# Patient Record
Sex: Female | Born: 1950
Health system: Southern US, Community
[De-identification: ages and names within clinical notes are randomized; demographics above are authoritative.]

## PROBLEM LIST (undated history)

## (undated) DIAGNOSIS — I10 Essential (primary) hypertension: Secondary | ICD-10-CM

## (undated) DIAGNOSIS — R519 Headache, unspecified: Secondary | ICD-10-CM

## (undated) DIAGNOSIS — R42 Dizziness and giddiness: Secondary | ICD-10-CM

## (undated) DIAGNOSIS — E785 Hyperlipidemia, unspecified: Secondary | ICD-10-CM

## (undated) DIAGNOSIS — M1990 Unspecified osteoarthritis, unspecified site: Secondary | ICD-10-CM

## (undated) DIAGNOSIS — T4145XA Adverse effect of unspecified anesthetic, initial encounter: Secondary | ICD-10-CM

## (undated) DIAGNOSIS — F419 Anxiety disorder, unspecified: Secondary | ICD-10-CM

## (undated) DIAGNOSIS — T8859XA Other complications of anesthesia, initial encounter: Secondary | ICD-10-CM

## (undated) DIAGNOSIS — G4700 Insomnia, unspecified: Secondary | ICD-10-CM

## (undated) DIAGNOSIS — K5900 Constipation, unspecified: Secondary | ICD-10-CM

## (undated) DIAGNOSIS — E669 Obesity, unspecified: Secondary | ICD-10-CM

## (undated) DIAGNOSIS — K219 Gastro-esophageal reflux disease without esophagitis: Secondary | ICD-10-CM

## (undated) DIAGNOSIS — E119 Type 2 diabetes mellitus without complications: Secondary | ICD-10-CM

## (undated) DIAGNOSIS — R011 Cardiac murmur, unspecified: Secondary | ICD-10-CM

## (undated) DIAGNOSIS — N3941 Urge incontinence: Secondary | ICD-10-CM

## (undated) DIAGNOSIS — R51 Headache: Secondary | ICD-10-CM

## (undated) DIAGNOSIS — B369 Superficial mycosis, unspecified: Secondary | ICD-10-CM

## (undated) DIAGNOSIS — K5904 Chronic idiopathic constipation: Secondary | ICD-10-CM

## (undated) DIAGNOSIS — M069 Rheumatoid arthritis, unspecified: Secondary | ICD-10-CM

## (undated) HISTORY — DX: Insomnia, unspecified: G47.00

## (undated) HISTORY — DX: Constipation, unspecified: K59.00

## (undated) HISTORY — PX: TOE SURGERY: SHX1073

## (undated) HISTORY — DX: Hyperlipidemia, unspecified: E78.5

## (undated) HISTORY — DX: Essential (primary) hypertension: I10

## (undated) HISTORY — PX: TOTAL HIP ARTHROPLASTY: SHX124

## (undated) HISTORY — DX: Rheumatoid arthritis, unspecified: M06.9

## (undated) HISTORY — DX: Unspecified osteoarthritis, unspecified site: M19.90

## (undated) HISTORY — DX: Superficial mycosis, unspecified: B36.9

## (undated) HISTORY — DX: Type 2 diabetes mellitus without complications: E11.9

## (undated) HISTORY — DX: Chronic idiopathic constipation: K59.04

## (undated) HISTORY — DX: Dizziness and giddiness: R42

## (undated) HISTORY — PX: JOINT REPLACEMENT: SHX530

## (undated) HISTORY — DX: Urge incontinence: N39.41

---

## 1985-07-10 HISTORY — PX: ABDOMINAL HYSTERECTOMY: SHX81

## 1985-07-10 HISTORY — PX: PARTIAL HYSTERECTOMY: SHX80

## 1987-07-11 HISTORY — PX: ABDOMINAL HYSTERECTOMY: SHX81

## 1999-09-12 ENCOUNTER — Encounter: Payer: Self-pay | Admitting: Orthopedic Surgery

## 1999-09-13 ENCOUNTER — Encounter: Payer: Self-pay | Admitting: Orthopedic Surgery

## 1999-09-13 ENCOUNTER — Inpatient Hospital Stay (HOSPITAL_COMMUNITY): Admission: RE | Admit: 1999-09-13 | Discharge: 1999-09-18 | Payer: Self-pay | Admitting: Orthopedic Surgery

## 2001-06-24 ENCOUNTER — Ambulatory Visit (HOSPITAL_COMMUNITY): Admission: RE | Admit: 2001-06-24 | Discharge: 2001-06-24 | Payer: Self-pay | Admitting: Specialist

## 2001-06-24 ENCOUNTER — Encounter: Payer: Self-pay | Admitting: Specialist

## 2001-07-05 ENCOUNTER — Ambulatory Visit (HOSPITAL_COMMUNITY): Admission: RE | Admit: 2001-07-05 | Discharge: 2001-07-05 | Payer: Self-pay | Admitting: Family Medicine

## 2001-07-05 ENCOUNTER — Encounter: Payer: Self-pay | Admitting: Family Medicine

## 2002-01-31 ENCOUNTER — Encounter: Payer: Self-pay | Admitting: Podiatry

## 2002-02-03 ENCOUNTER — Ambulatory Visit (HOSPITAL_COMMUNITY): Admission: RE | Admit: 2002-02-03 | Discharge: 2002-02-03 | Payer: Self-pay | Admitting: Podiatry

## 2002-07-04 ENCOUNTER — Encounter: Payer: Self-pay | Admitting: Specialist

## 2002-07-04 ENCOUNTER — Ambulatory Visit (HOSPITAL_COMMUNITY): Admission: RE | Admit: 2002-07-04 | Discharge: 2002-07-04 | Payer: Self-pay | Admitting: Specialist

## 2002-10-10 ENCOUNTER — Ambulatory Visit (HOSPITAL_COMMUNITY): Admission: RE | Admit: 2002-10-10 | Discharge: 2002-10-10 | Payer: Self-pay | Admitting: Family Medicine

## 2002-10-10 ENCOUNTER — Encounter: Payer: Self-pay | Admitting: Family Medicine

## 2002-10-20 ENCOUNTER — Ambulatory Visit (HOSPITAL_COMMUNITY): Admission: RE | Admit: 2002-10-20 | Discharge: 2002-10-20 | Payer: Self-pay | Admitting: Internal Medicine

## 2002-10-20 HISTORY — PX: COLONOSCOPY: SHX174

## 2002-12-26 ENCOUNTER — Ambulatory Visit: Admission: RE | Admit: 2002-12-26 | Discharge: 2002-12-26 | Payer: Self-pay | Admitting: Family Medicine

## 2003-07-24 ENCOUNTER — Ambulatory Visit (HOSPITAL_COMMUNITY): Admission: RE | Admit: 2003-07-24 | Discharge: 2003-07-24 | Payer: Self-pay | Admitting: Specialist

## 2004-09-09 ENCOUNTER — Ambulatory Visit (HOSPITAL_COMMUNITY): Admission: RE | Admit: 2004-09-09 | Discharge: 2004-09-09 | Payer: Self-pay | Admitting: Specialist

## 2004-12-02 ENCOUNTER — Encounter (HOSPITAL_COMMUNITY): Admission: RE | Admit: 2004-12-02 | Discharge: 2005-01-01 | Payer: Self-pay | Admitting: Oncology

## 2004-12-02 ENCOUNTER — Encounter: Admission: RE | Admit: 2004-12-02 | Discharge: 2004-12-02 | Payer: Self-pay | Admitting: Oncology

## 2004-12-02 ENCOUNTER — Ambulatory Visit (HOSPITAL_COMMUNITY): Payer: Self-pay | Admitting: Oncology

## 2005-08-25 ENCOUNTER — Ambulatory Visit (HOSPITAL_COMMUNITY): Admission: RE | Admit: 2005-08-25 | Discharge: 2005-08-25 | Payer: Self-pay | Admitting: Family Medicine

## 2005-12-08 ENCOUNTER — Encounter: Admission: RE | Admit: 2005-12-08 | Discharge: 2005-12-08 | Payer: Self-pay | Admitting: Specialist

## 2005-12-20 ENCOUNTER — Ambulatory Visit (HOSPITAL_COMMUNITY): Admission: EM | Admit: 2005-12-20 | Discharge: 2005-12-21 | Payer: Self-pay | Admitting: Emergency Medicine

## 2005-12-20 ENCOUNTER — Encounter: Payer: Self-pay | Admitting: Emergency Medicine

## 2006-02-15 ENCOUNTER — Ambulatory Visit (HOSPITAL_COMMUNITY): Admission: RE | Admit: 2006-02-15 | Discharge: 2006-02-15 | Payer: Self-pay | Admitting: Family Medicine

## 2006-07-10 HISTORY — PX: TOTAL KNEE ARTHROPLASTY: SHX125

## 2006-08-31 ENCOUNTER — Ambulatory Visit (HOSPITAL_COMMUNITY): Admission: RE | Admit: 2006-08-31 | Discharge: 2006-08-31 | Payer: Self-pay | Admitting: Family Medicine

## 2006-09-24 ENCOUNTER — Ambulatory Visit: Payer: Self-pay | Admitting: Orthopedic Surgery

## 2006-09-24 ENCOUNTER — Inpatient Hospital Stay (HOSPITAL_COMMUNITY): Admission: EM | Admit: 2006-09-24 | Discharge: 2006-09-25 | Payer: Self-pay | Admitting: Emergency Medicine

## 2006-12-24 ENCOUNTER — Ambulatory Visit (HOSPITAL_COMMUNITY): Admission: EM | Admit: 2006-12-24 | Discharge: 2006-12-24 | Payer: Self-pay | Admitting: Emergency Medicine

## 2007-01-08 ENCOUNTER — Ambulatory Visit (HOSPITAL_COMMUNITY): Admission: RE | Admit: 2007-01-08 | Discharge: 2007-01-08 | Payer: Self-pay | Admitting: Family Medicine

## 2007-04-02 ENCOUNTER — Inpatient Hospital Stay (HOSPITAL_COMMUNITY): Admission: RE | Admit: 2007-04-02 | Discharge: 2007-04-05 | Payer: Self-pay | Admitting: Orthopedic Surgery

## 2007-05-25 ENCOUNTER — Ambulatory Visit (HOSPITAL_COMMUNITY): Admission: EM | Admit: 2007-05-25 | Discharge: 2007-05-25 | Payer: Self-pay | Admitting: Emergency Medicine

## 2007-07-08 ENCOUNTER — Inpatient Hospital Stay (HOSPITAL_COMMUNITY): Admission: RE | Admit: 2007-07-08 | Discharge: 2007-07-12 | Payer: Self-pay | Admitting: Orthopedic Surgery

## 2008-01-09 ENCOUNTER — Ambulatory Visit (HOSPITAL_COMMUNITY): Admission: RE | Admit: 2008-01-09 | Discharge: 2008-01-09 | Payer: Self-pay | Admitting: Obstetrics & Gynecology

## 2008-07-10 HISTORY — PX: TOTAL HIP ARTHROPLASTY: SHX124

## 2009-04-30 ENCOUNTER — Ambulatory Visit (HOSPITAL_COMMUNITY): Admission: RE | Admit: 2009-04-30 | Discharge: 2009-04-30 | Payer: Self-pay | Admitting: Family Medicine

## 2009-05-21 ENCOUNTER — Ambulatory Visit (HOSPITAL_COMMUNITY): Admission: RE | Admit: 2009-05-21 | Discharge: 2009-05-21 | Payer: Self-pay | Admitting: Obstetrics & Gynecology

## 2009-07-05 ENCOUNTER — Inpatient Hospital Stay (HOSPITAL_COMMUNITY): Admission: RE | Admit: 2009-07-05 | Discharge: 2009-07-08 | Payer: Self-pay | Admitting: Orthopedic Surgery

## 2010-07-05 ENCOUNTER — Ambulatory Visit (HOSPITAL_COMMUNITY)
Admission: RE | Admit: 2010-07-05 | Discharge: 2010-07-05 | Payer: Self-pay | Source: Home / Self Care | Attending: Family Medicine | Admitting: Family Medicine

## 2010-07-30 ENCOUNTER — Encounter: Payer: Self-pay | Admitting: Specialist

## 2010-07-30 ENCOUNTER — Encounter: Payer: Self-pay | Admitting: Family Medicine

## 2010-09-23 ENCOUNTER — Emergency Department (HOSPITAL_COMMUNITY): Payer: BC Managed Care – PPO

## 2010-09-23 ENCOUNTER — Emergency Department (HOSPITAL_COMMUNITY)
Admission: EM | Admit: 2010-09-23 | Discharge: 2010-09-24 | Disposition: A | Discharge status: Home / Self Care | Payer: BC Managed Care – PPO | Attending: Emergency Medicine | Admitting: Emergency Medicine

## 2010-09-23 DIAGNOSIS — R0989 Other specified symptoms and signs involving the circulatory and respiratory systems: Secondary | ICD-10-CM | POA: Insufficient documentation

## 2010-09-23 DIAGNOSIS — I10 Essential (primary) hypertension: Secondary | ICD-10-CM | POA: Insufficient documentation

## 2010-09-23 DIAGNOSIS — Z79899 Other long term (current) drug therapy: Secondary | ICD-10-CM | POA: Insufficient documentation

## 2010-09-23 DIAGNOSIS — R0609 Other forms of dyspnea: Secondary | ICD-10-CM | POA: Insufficient documentation

## 2010-09-23 DIAGNOSIS — M129 Arthropathy, unspecified: Secondary | ICD-10-CM | POA: Insufficient documentation

## 2010-09-23 DIAGNOSIS — R0602 Shortness of breath: Secondary | ICD-10-CM | POA: Insufficient documentation

## 2010-09-23 DIAGNOSIS — R51 Headache: Secondary | ICD-10-CM | POA: Insufficient documentation

## 2010-10-10 LAB — URINALYSIS, ROUTINE W REFLEX MICROSCOPIC
Bilirubin Urine: NEGATIVE
Nitrite: NEGATIVE
Urobilinogen, UA: 1 mg/dL (ref 0.0–1.0)
pH: 8 (ref 5.0–8.0)

## 2010-10-10 LAB — CBC
HCT: 29.9 % — ABNORMAL LOW (ref 36.0–46.0)
HCT: 41.1 % (ref 36.0–46.0)
Hemoglobin: 13.6 g/dL (ref 12.0–15.0)
Hemoglobin: 9.9 g/dL — ABNORMAL LOW (ref 12.0–15.0)
MCHC: 33.1 g/dL (ref 30.0–36.0)
MCHC: 33.2 g/dL (ref 30.0–36.0)
Platelets: 215 10*3/uL (ref 150–400)
RDW: 13.9 % (ref 11.5–15.5)
RDW: 14.6 % (ref 11.5–15.5)
RDW: 14.8 % (ref 11.5–15.5)
WBC: 5.8 10*3/uL (ref 4.0–10.5)

## 2010-10-10 LAB — DIFFERENTIAL
Monocytes Absolute: 0.4 10*3/uL (ref 0.1–1.0)
Monocytes Relative: 6 % (ref 3–12)

## 2010-10-10 LAB — BASIC METABOLIC PANEL
BUN: 16 mg/dL (ref 6–23)
BUN: 8 mg/dL (ref 6–23)
CO2: 31 mEq/L (ref 19–32)
Calcium: 8.7 mg/dL (ref 8.4–10.5)
Calcium: 8.8 mg/dL (ref 8.4–10.5)
Calcium: 9.9 mg/dL (ref 8.4–10.5)
Creatinine, Ser: 0.6 mg/dL (ref 0.4–1.2)
GFR calc Af Amer: >60 mL/min (ref >60)
GFR calc non Af Amer: >60 mL/min (ref >60)
Glucose, Bld: 105 mg/dL — ABNORMAL HIGH (ref 70–99)
Glucose, Bld: 117 mg/dL — ABNORMAL HIGH (ref 70–99)
Glucose, Bld: 97 mg/dL (ref 70–99)
Potassium: 4 mEq/L (ref 3.5–5.1)
Sodium: 139 mEq/L (ref 135–145)
Sodium: 140 mEq/L (ref 135–145)

## 2010-10-10 LAB — TYPE AND SCREEN: ABO/RH(D): O NEG

## 2010-10-10 LAB — HEMOGLOBIN AND HEMATOCRIT, BLOOD: HCT: 24.7 % — ABNORMAL LOW (ref 36.0–46.0)

## 2010-10-10 LAB — PROTIME-INR: Prothrombin Time: 13.3 seconds (ref 11.6–15.2)

## 2010-11-22 NOTE — H&P (Signed)
NAMEANNABELLE, [name]                 ACCOUNT NO.:  [id number]   MEDICAL RECORD NO.:  [id number]          PATIENT TYPE:  INP   LOCATION:  NA                           FACILITY:  [facility]   PHYSICIAN:  Madlyn Frankel. Charlann Boxer, M.D.  DATE OF BIRTH:  1951/06/20   DATE OF ADMISSION:  04/02/2007  DATE OF DISCHARGE:                              HISTORY & PHYSICAL   PROCEDURE:  Left total knee arthroplasty.   CHIEF COMPLAINT:  Left knee pain.   HISTORY OF PRESENT ILLNESS:  This is a 60 year old female with a history  persistent progressive left knee pain secondary to osteoarthritis.  It  has been refractory to all conservative treatments.  She had been  presurgically assessed by per primary care physician, Dr. Phillips Odor.   PAST MEDICAL HISTORY:  1. Osteoarthritis.  2. Hypertension.  3. Hyperlipidemia.   PAST SURGICAL HISTORY:  1. Hysterectomy.  2. Total hip placement in 2001.  3. Bunionectomy and hammertoe procedure in 2003.   FAMILY HISTORY:  Heart disease, hypertension, diabetes, arthritis.   SOCIAL HISTORY:  Divorced.  Primary caregiver after surgery, friend.   ALLERGIES:  NO KNOWN DRUG ALLERGIES.   MEDICATIONS:  1. Diflunisal 500 mg p.o. b.i.d.  2. Bupropion SR 150 mg p.o. b.i.d.  3. Simvastatin 40 mg, 1/2 tablet once daily.  4. Zolpidem 10 mg p.o. q.h.s. p.r.n.   REVIEW OF SYSTEMS:  MUSCULOSKELETAL:  She has some right ankle pain with  a history of osteoarthritis.  Otherwise, see HPI.   PHYSICAL EXAMINATION:  VITAL SIGNS:  Pulse 64, respirations 18, blood  pressure 120/72.  GENERAL:  Awake, alert and oriented, well-developed, well-nourished.  NECK:  Supple.  No carotid bruits.  CHEST/LUNGS:  Clear to auscultation bilaterally.  BREASTS:  Deferred.  HEART:  Regular rate and rhythm without murmurs.  ABDOMEN:  Soft, nontender, nondistended.  Bowel sounds present.  GENITOURINARY:  Deferred.  EXTREMITIES:  She has a left knee valgus.  Right ankle pain, lateral  joint line.  SKIN:   Intact.  No cellulitis.  NEUROLOGIC:  Intact distal sensibilities.   LABORATORY DATA:  Preoperative CMET showed her sodium 138, potassium  3.7, glucose 87, BUN 15, creatinine 0.82.   EKG:  Normal sinus rhythm.  All other labs and chest x-ray pending  presurgical testing.   IMPRESSION:  1. Osteoarthritis.  2. Hypertension.  3  Hyperlipidemia.   PROCEDURE:  Left total knee arthroplasty on April 02, 1999 at [facility] by surgeon Dr. Durene Romans.  The risks and complications  were discussed.   Postoperative medications including Lovenox, Robaxin, over-the-counter  iron, aspirin, Colace, MiraLax provided at time of history and physical.  Pain medicines will be prescribed at time of surgery.     ______________________________  Yetta Glassman [address], [place]      Madlyn Frankel. Charlann Boxer, M.D.  Electronically Signed    BLM/MEDQ  D:  04/01/2007  T:  04/01/2007  Job:  [id number]

## 2010-11-22 NOTE — H&P (Signed)
NAMERAYYAN, ORSBORN                 ACCOUNT NO.:  [id number]   MEDICAL RECORD NO.:  [id number]          PATIENT TYPE:  INP   LOCATION:  NA                           FACILITY:  [facility]   PHYSICIAN:  Madlyn Frankel. Charlann Boxer, M.D.  DATE OF BIRTH:  Jul 02, 1951   DATE OF ADMISSION:  07/08/2007  DATE OF DISCHARGE:                              HISTORY & PHYSICAL   PROCEDURE:  Revision right total hip arthroplasty.   CHIEF COMPLAINT:  Right hip instability.   HISTORY OF PRESENT ILLNESS:  This is a 60 year old female with a history  of right total hip replacement in 2001 with four dislocations over the  past year.  There has only been minor discomfort.  She has had  persistent instability after these dislocations.  She does have a recent  history of a left total knee replacement, for which she is doing very  well.   PAST MEDICAL HISTORY:  1. Osteoarthritis.  2. Hypertension.  3. Hypercholesteremia.   PAST SURGICAL HISTORY:  1. Hysterectomy in 1987.  2. Right total knee replacement in 2001.  3. Foot surgery in 2003.  4. Multiple hip dislocations in July 2007, March, June and November      2008.   FAMILY HISTORY:  Heart disease, diabetes, hypertension.   SOCIAL HISTORY:  The patient is single.  Primary caregiver will be her  sister after surgery in the home.   DRUG ALLERGIES:  No known drug allergies.   MEDICATIONS:  1. Diflunisal 500 mg one p.o. b.i.d.  2. Bupropion SR 150 mg one p.o. b.i.d.  3. Ambien 10 mg p.o. p.r.n.  4. Simvastatin 20 mg one p.o. daily.  5. Lisinopril/HCTZ 20/25 mg one p.o. daily.  6. Multivitamin daily.  7. Calcium daily.   REVIEW OF SYSTEMS:  GENERAL:  Some recent weight change as well as some  morning night sweats.  HEENT/NEUROLOGIC:  She has had some insomnia and  ringing in her years.  Otherwise, see HPI.   PHYSICAL EXAMINATION:  Pulse 72, respirations 18, blood pressure 112/90.  GENERAL:  Awake, alert and oriented, well-developed, well-nourished, no  acute  distress.  NECK:  Supple.  No carotid bruits.  CHEST:  Lungs clear to auscultation bilaterally.  BREASTS:  Deferred.  HEART:  Regular rate and rhythm without murmurs.  ABDOMEN:  Soft, nontender, nondistended.  Bowel sounds present.  GENITOURINARY:  Deferred.  EXTREMITIES:  Right hip:  No increased pain with internal range of  motion.  Nontender to palpation over the lateral trochanteric bursa  area.  SKIN:  A posterior lateral scar from previous hip surgery.  No  cellulitis.  NEUROLOGIC:  Intact distal sensibilities.   Labs, EKG, chest x-ray all pending presurgical testing.   IMPRESSION:  1. Multiple dislocations of right total hip arthroplasty with      instability.  2. Osteoarthritis.  3. Left total knee replacement.  4. Hypertension.  5. Dyslipidemia.   PLAN OF ACTION:  A revision right total hip arthroplasty by surgeon Dr.  Durene Romans on July 08, 2007, at [facility].  Risks and  complications were discussed.  Questions were encouraged, answered and  reviewed.   Postoperative medications including Lovenox, Robaxin, iron, aspirin,  Colace, MiraLax provided at time of history and physical.  Postoperative  pain medicines will be provided at time of surgery.     ______________________________  Yetta Glassman [address], [place]      Madlyn Frankel. Charlann Boxer, M.D.  Electronically Signed    BLM/MEDQ  D:  07/01/2007  T:  07/02/2007  Job:  [id number]   cc:   Corrie Mckusick, M.D.  Fax: [phone]   Dr. Christen Bame

## 2010-11-22 NOTE — Op Note (Signed)
[name], HEMMER                 ACCOUNT NO.:  [id number]   MEDICAL RECORD NO.:  [id number]          PATIENT TYPE:  INP   LOCATION:  0104                         FACILITY:  [facility]   PHYSICIAN:  Madlyn Frankel. Charlann Boxer, M.D.  DATE OF BIRTH:  Jan 07, 1951   DATE OF PROCEDURE:  05/25/2007  DATE OF DISCHARGE:  05/25/2007                               OPERATIVE REPORT   PREOPERATIVE DIAGNOSIS:  Dislocation of right total hip replacement.   POSTOPERATIVE DIAGNOSIS:  Dislocation of right total hip replacement.   PROCEDURE:  Closed reduction of right total hip replacement.   SURGEON:  Madlyn Frankel. Charlann Boxer, M.D.   ASSISTANT:  None.   COMPLICATIONS:  None.   ANESTHESIA:  General.   INDICATIONS FOR PROCEDURE:  The patient is a 60 year old female who I  have been following for instability of the right hip.  She had a total  hip replacement performed in the past and now this is her fourth  dislocation.  After the third dislocation, we talked about revising it.  She had a period of time where she had not had any problems with it and  we at our last discussion planned to continue due to the amount of time  she had been out of work from previous hip as well as knee surgery.  Unfortunately, she slipped today, not falling but having a popping  sensation and dislocating the right hip.  She was brought to the  emergency room where radiographs revealed the above findings.   I reviewed with her the current situation and revise her at an upcoming  date based on this.   DESCRIPTION OF PROCEDURE:  The patient was brought to the operative  theatre and once adequate anesthesia was established, the patient was  positioned supine.  Pressure was placed on her pelvis.  The hip was  flexed to 90, traction was applied, and the hip was audibly palpably  reduced.  Leg lengths were normal.  The foot turned from shortened and  internally rotated position back to its normal neutral slightly external  rotated position.   She was subsequently awakened from anesthesia and an 18 inch medium knee  immobilizer was applied to provide some stability from anterolateral  medial flexion.   As I reviewed with her, I will see her back in the office in two weeks.      Madlyn Frankel Charlann Boxer, M.D.  Electronically Signed     MDO/MEDQ  D:  05/25/2007  T:  05/26/2007  Job:  [id number]

## 2010-11-22 NOTE — Op Note (Signed)
NAMECHANNEL, PAPANDREA                 ACCOUNT NO.:  [id number]   MEDICAL RECORD NO.:  [id number]          PATIENT TYPE:  OBV   LOCATION:  2550                         FACILITY:  [facility]   PHYSICIAN:  Ollen Gross, M.D.    DATE OF BIRTH:  Nov 16, 1950   DATE OF PROCEDURE:  12/24/2006  DATE OF DISCHARGE:  12/24/2006                               OPERATIVE REPORT   PREOPERATIVE DIAGNOSIS:  Dislocated right total hip arthroplasty.   POSTOPERATIVE DIAGNOSIS:  Dislocated right total hip arthroplasty.   PROCEDURE:  Right hip closed reduction.   SURGEON:  Ollen Gross, M.D.   ASSISTANT:  Alexzandrew L. Perkins, P.A.C.   ANESTHESIA:  General.   COMPLICATIONS:  None.   CONDITION:  Stable to recovery.   BRIEF CLINICAL NOTE:  Ms. Cahn is a 61 year old female who had a total  hip arthroplasty done approximately 7 years ago.  She did very well with  it.  Last June, she sustained her first dislocations and has had another  subsequent dislocation 2 months ago.  Unfortunately, this evening at  work she bent down to pick something up and dislocated it again.  She  was taken to the emergency room and presents now for closed reduction.   PROCEDURE IN DETAIL:  After successful administration of general  anesthetic, we placed compression over her pelvis, and then with  longitudinal traction in a 90/90 position I was able to reduce the hip.  She had good stability, full extension in external rotation and at about  90 degrees of flexion and did not rotate internally.  X-ray confirmed  concentric reduction of the hip.  She was placed into a knee  immobilizer, awakened, and transported to recovery in stable condition.      Ollen Gross, M.D.  Electronically Signed     FA/MEDQ  D:  12/24/2006  T:  12/25/2006  Job:  [id number]

## 2010-11-22 NOTE — Op Note (Signed)
[name], [name]                 ACCOUNT NO.:  [id number]   MEDICAL RECORD NO.:  [id number]          PATIENT TYPE:  INP   LOCATION:  1605                         FACILITY:  [facility]   PHYSICIAN:  Madlyn Frankel. Charlann Boxer, M.D.  DATE OF BIRTH:  [birth year]   DATE OF PROCEDURE:  04/02/2007  DATE OF DISCHARGE:                               OPERATIVE REPORT   PREOPERATIVE DIAGNOSIS:  Left knee end stage osteoarthritis.   POSTOPERATIVE DIAGNOSIS:  Left knee end stage osteoarthritis.   PROCEDURE:  Left total knee replacement.   COMPONENTS USED:  DePuy rotating platform posterior stabilized knee  system, size 3 femur, size 3 tibia, 15 mm insert, 38 patellar button.   SURGEON:  Madlyn Frankel. Charlann Boxer, M.D.   ASSISTANT:  [name], P.A.-C.   ANESTHESIA:  Duramorph spinal.   DRAINS:  One.   COMPLICATIONS:  None.   TOURNIQUET TIME:  55 minutes at 250 mmHg.   FINDINGS:  Please note, preoperatively she was noted have pretty  significant valgus deformity, worse dynamically, in probably 10 degrees  of hyperextension, with the valgus of probably 15-20 degrees, given her  preceding combined deformity.   INDICATIONS FOR PROCEDURE:  [name] is a 60 year old female who I  initially was following for some issues regarding an unstable hip  replacement performed by another surgeon.  She had advanced left knee  osteoarthritis we have been following conservatively, as well, that  failed to respond to recent conservative measures and her decreased  quality life based on decreased mobility ultimately lead to her decision  to proceed with knee replacement surgery.  She may ultimately require  her right hip to be revised.  We reviewed the risks and benefits of this  surgery in association with her hip issues and also a discussion of  infection, DVT, component failure, need for revision surgery.  Consent  was obtained.   PROCEDURE IN DETAIL:  The patient was brought to operative theater.  Once adequate  anesthesia and preoperative antibiotics, Ancef, were  administered, the patient was positioned supine.  A left proximal thigh  tourniquet was placed.  The left knee was pre-scrubbed and prepped and  draped in a sterile fashion.  A slightly curved incision was made for  this valgus deformity knowing that it would be corrected when she was  straight.  A median parapatellar arthrotomy was created as opposed to  lateral due to the fact that she did not have a fixed valgus deformity.   Routine exposure was carried out.  She was noted to have lax tissues and  thin tendons.  These were protected throughout the case.  Once the knee  was exposed, attention was first directed to the patella.  The patella  was noted to be severely deformed primarily on the lateral facet.  Her  medial facet was only about 17 mm precut and the lateral side was  significantly less than this.  I used an oscillating saw at this point  to cut bone down on this medial facet to about 13 mm.  This approximated  the medial facet to the  lateral facet, but there was no significant bone  cut made. After trimming it up, I determined a size 38 patellar button  would work best. I used the smooth pin to create some further holes in  the sclerotic lateral remaining facet for interdigitation of cement.  I  then drilled the holes for the 3 peg for the patella, getting good  normal depth on the medial facet and only about partial 1/3 depth  laterally.  At this point, a metal shim was placed to protect the  patella from further cuts.   Attention was now directed to the femur.  The femoral canal was opened  and irrigated to prevent fat emboli.  I then passed the intramedullary  rod.  Based on her laxity and hyperextension, I only resected 9 mm of  bone off the distal femur at 5 degrees valgus. 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   At this point, I attended to the sizing. Based on the posterior condylar  axis for rotation, it was actually perpendicular to the [facility] lines  and displaced the sclerotic nature. The anterior, posterior, and chamfer  cuts were all made. Following this, I made further drill holes into this  very sclerotic bone laterally. Bone cuts were checked and rechecked a  couple of times including during the trial, noted later to prevent any  problems with fracture from impacting the metal prosthesis in the  sclerotic bone.  Following these cuts, the box cut was made based off  the lateral aspect of the distal femur.   Attention was now directed to the tibia. With the tibia subluxated  anteriorly, I used the extramedullary guide. Based on the appearance of  the radiographs and the laxity of the knee, I chose to only take 2 mm of  bone off the lateral proximal tibia.  This amounted to what appeared to  be about a 6 mm cut medially.  Following this cut, I checked an  extension block and at this point, I was unable to even get the  extension block in place.  For this purpose, I repositioned my cutting  block and resected another 6 mm of bone.  After doing this cut, I was  able to at least get the 10 and 12 looked like there was a slight bit of  laxity and I thought that I made need to go to a 15.  At this point, I  checked with the size 3 proximal tibia and felt this fit the cut surface  nicely providing the best surface area coverage.  I checked the  alignment rod and was happy that it passed through the center of the  medial third of tubercle and into the ankle in both planes. I drilled  and keel punched this area.  Care, again, was taken not to cause  problems or complications due to the fact that she had sclerotic bone  laterally.  Some drill holes were made into the proximal lateral tibia.   At this point, trial reduction was carried out  with the 3 femur, 3  tibia, and a 15 mm insert. The knee came out to full extension, a slight  bit of laxity medially still, but the lateral side remained tight.  The  patella tracked without application of pressure.  All trial  components  were removed at this point and I used laminar spreaders to debride some  of the posterior, medial, and lateral osteophytes, further debridement  was carried out as necessary.  Once I was satisfied with this  debridement, further drill holes were made for interdigitation of  cement.  The knee was irrigated with normal saline solution.  I then  injected the knee with 0.25% Marcaine with epinephrine and 1 mL of  Toradol.   The final components were opened and cement mixed.  The components were  then cemented in position.  A 15 mm spacer was placed and the knee  brought out to full extension with a varus load applied.  The extruded  cement was removed.  Once I was satisfied that the cement had cured,  excessive cement was debrided.  Further debridements of cement were  carried out as necessary.  I injected 5 mL of FloSeal into the  posterior, medial, and lateral gutters as well as the posterior capsular  area.  Then, the final 15 mm insert was placed. The knee was brought to  extension.  A medium Hemovac drain was placed.  I then brought the knee  to flexion and with #1 Vicryls, reapproximated the  extensor mechanism. 2-0 Vicryl and 4-0 running Monocryl were used on the  skin.  The skin was cleaned, dried, and dressed sterilely with Steri-  Strips and a sterile bulky wrap.  She was brought to the recovery room  in stable condition.      Madlyn Frankel Charlann Boxer, M.D.  Electronically Signed     MDO/MEDQ  D:  04/02/2007  T:  04/02/2007  Job:  [id number]

## 2010-11-22 NOTE — Op Note (Signed)
[name], [name]                 ACCOUNT NO.:  [id number]   MEDICAL RECORD NO.:  [id number]          PATIENT TYPE:  INP   LOCATION:  0001                         FACILITY:  [facility]   PHYSICIAN:  Madlyn Frankel. Charlann Boxer, M.D.  DATE OF BIRTH:  [birth year]   DATE OF PROCEDURE:  07/08/2007  DATE OF DISCHARGE:                               OPERATIVE REPORT   PREOPERATIVE DIAGNOSIS:  Right total hip instability.   POSTOPERATIVE DIAGNOSIS:  Right total hip instability.   PROCEDURES:  1. Revision right total hip replacement.  2. Trochanteric osteotomy.  3. Repair of osteotomy.   COMPONENTS USED:  DePuy hip system with a size 52 multihole Pinnacle  cup, 36 neutral metal liner and SROM 18 x 13 standard length with an 18D  small sleeve and a 18 x 13, 36 +12 stem, 36 +3 metal ball.   SURGEON:  Charlann Boxer   ASSISTANT:  Dwyane Luo   ANESTHESIA:  General.   BLOOD LOSS:  1 liter.   AUTOTRANSFUSIONS:  4 mL.   DRAINS:  Times one.   COMPLICATIONS:  None.   INDICATIONS FOR PROCEDURE:  Ms. Wollenberg is a 60 year old patient that I  have been following for some time for her hip after initial dislocation.  She had an index procedure performed in 2001 with at least four  dislocations over the past year.   She had at this point undergone a left total knee replacement by myself  and done real well with that.  Given the recurrence of her instability,  she wished to discuss revision surgery.  Extensive discussion  preoperatively discussed the possible need for revising the entire hip  if the components were out of position or if components were not  available.  There was no constrained liner available for the system.  We  reviewed the risks and benefits of this type procedure including the  obvious increased risk of recurrent dislocation.  The standard risks of  infection, DVT, component failure, need for revision surgery were all  reviewed.  Consent was obtained.   PROCEDURE IN DETAIL:  The patient was brought  to the operative theater.  Once adequate anesthesia, preoperative antibiotics, 2 grams of Ancef,  were administered.  The patient was positioned in the left lateral  decubitus position with the right side up.  The right lower extremity  was prescrubbed and prepped and draped in sterile fashion.  The patient  was noted to have a very long index incision.  This basically was  utilized.  I excised the entire portion of it, excising down to the  iliotibial band and gluteal fascia.  I initially exposed just the  proximal femur in the standard fashion.  The hip was easily dislocated  with just a little bit of internal rotation.  The femoral head was  removed.   Attention was now directed for approximately 15-20 minutes at the  proximal femur following debridement.  I tried to see if there was  anyway to loop the component at all.  After this 15-20-minute period of  time, I was not satisfied with making any progress  at all.  I decided to  go to the preoperatively planned osteotomy.  I exposed the proximal  femur at this point to the appropriate depth.  I then used the drill  holes to create a lot of my osteotomy and on the posterior aspect of the  lateral third of the femur, I used a small oscillating saw that was  utilized for ACL surgery to create a crack, and then I extended this  across the femur laterally to the anterior aspect and then slightly up  the anterior side.  Then utilizing a series of osteotomes I was able to  elevate this lateral third of the femur anteriorly exposing the stem.  I  then used osteotomes again to loosen it up further.  Once I saw a little  bit of motion,  I used a loop extractor and was able to remove the stem.  What I found was very interesting.  There was adequate bony ingrowth  proximally but significant bone ingrowth within splines of the distal  aspect of this component.  It was a secure fit plus component.   This made for a bit of an interesting plan as I  was initially planning  to use an SROM component to allow for offset but also proximal ingrowth  in this young female.   At this point I used a combination of Matt Holmes and pituitary rongeurs to  remove bone, identifying the distal pedestal.  I was able to pass the  [place] long drill bit past this.  There was still a pedestal.   With this hole opening here I did wire back the osteotomy with 18-gauge  wires, 3 of them, putting it back in anatomic position.  At this point I  attended to the acetabulum.  The acetabular liner was removed utilizing  the 6.5 cancellous screw.  Once I did this, I removed the metal ring and  used the same liner for the Zimmer explant system.  Utilizing a  combination of the short and then long blades and also traditional  osteotomes, I was able to remove the acetabulum.  There was some bone  loss, particularly in the medial aspect of the hip and a little bit  superior.  The bed was otherwise well-maintained with no fracture to the  pelvis.  I packed this off and now attended to the femur a bit more.  I  passed the ball-tipped guidewire past the area of this pedestal and then  used flexible reamers to ream through this area to have no concern for  effect of the straight reamers.  I reamed up to a 13 with good chatter.  I then backed down to a straight reamer with a size 10, 11, 12, then  12.5, 13 and 13.5 three quarters of the way down.  I then prepared the  proximal femur with a proximal reamer to a size D and then milled to a  small.   At this point I placed a D small sleeve.  Based on the location of the  previously placed stem and where I had milled, this was at the position  of about 15-20 degrees.   At this point with the sleeve in position, I retracted the femur  anterior to allow for work on the acetabulum.  I began reaming with a 45  reamer and carried it down the medial wall as I had this room.  I then  reamed up to 51 reamer with good bony bed  preparation.  I impacted a 52  multihole Gription revision cup with an excellent initial secure fit.  The cup position was 35 degrees of abduction and 20 degrees forward  flexion at least in position anatomically.  The patient was noted to  have what appeared to be a retroverted native acetabulum with a large  anterior wall perhaps leading to some of her impingement issues.  I had  placed two cancellous screws to support this fixation and then placed a  trial liner.  I used an osteotome to debride some of the anterior wall  bone.  This was done without issue or complication.  Trial reduction was  now carried out initially with an 18 x 13, 36 +8 stem, 36 +3 ball.  I  did antevert the femoral stem another 20 degrees based on the SROM  system.  There was a bit of impingement still particularly in the sleep  position with internal rotation and hip flexion.  I chose then the trial  with a 36 +12 stem.  With this stem and a 36 +3 ball, the hip stability  was excellent.  There was about a millimeter of shuck, and the leg  lengths appeared to be comparable.  At this point all trial components  were removed.  I placed the final 36 metal neutral liner onto a clean  and dried acetabular bed.   The 18D small sleeve was then impacted to the level which was prepared.  I then used another 18-gauge wire wrapped around the proximal femur at  this area and tightened this down for further fixation.  I then passed  an 18 x 13, 36 +12 final stem at 20 degrees anteversion based on the  hash marks from the prosthesis with an excellent fit.  There were no  complicating features.  The wires that had been previously placed were  nondisplaced but then tightened and the osteotomy remained anatomically  reduced.   At this point I retrialed but was happy again with a 36 +3 ball.  The 36  +3 ball was then impacted into a clean and dry trunnion and the hip  reduced.  The hip was irrigated throughout the case.  I used  the Cell  Saver on this case and returned 4 mL of blood to the patient.  I placed  a medium Hemovac drain.  There was no significant  hemostasis that was  required.  Based on the debridements and exposure purposes,  I did not  reapproximate the capsule or any fascia posteriorly.  Instead I used #1  Vicryl to reapproximate the iliotibial band from the distal incision  portion all the way proximal in an interrupted fashion and then used #1  Vicryl in the gluteal fascia.  The remainder of the wound was closed  with 2-0 Vicryl and in staples.  The hip was cleaned, dried and dressed  sterilely with Mepilex dressing and a Hemovac placed.  The patient was  then extubated and brought to the recovery room with a knee immobilizer  in place without apparent perioperative complication.      Madlyn Frankel Charlann Boxer, M.D.  Electronically Signed     MDO/MEDQ  D:  07/08/2007  T:  07/08/2007  Job:  [id number]

## 2010-11-25 NOTE — Op Note (Signed)
NAMEWANETA, FITTING                 ACCOUNT NO.:  [id number]   MEDICAL RECORD NO.:  [id number]          PATIENT TYPE:  INP   LOCATION:  A332                          FACILITY:  [facility]   PHYSICIAN:  Vickki Hearing, M.D.DATE OF BIRTH:  1950-08-14   DATE OF PROCEDURE:  09/24/2006  DATE OF DISCHARGE:  09/25/2006                               OPERATIVE REPORT   INDICATIONS FOR PROCEDURE:  This59 year old female, status post right  total hip replacement in 2000, for osteoarthritis by Dr. Marlowe Kays, did well until 2007.  She the first dislocation and was  reduced by Dr. Shelle Iron.  The patient was followed by Dr. Simonne Come.  She  was standing with the hip in flexion and the right knee on a bed.  She  reached forward and the hip popped out at approximately 2:00 p.m. today.  She tried to convince herself that nothing was wrong with the hip.  Eventually she came to the emergency room for an attempt at a closed  reduction was performed using __________ and fentanyl.  This was  unsuccessful.  Post-reduction films showed the hip still dislocated.  She was then scheduled for surgery.   PREOPERATIVE DIAGNOSIS:  A closed dislocation of right total hip  prosthesis.   POSTOPERATIVE DIAGNOSIS:  A closed dislocation of right total hip  prosthesis.   PROCEDURE:  Closed reduction of right hip dislocation.   SURGEON:  Vickki Hearing, M.D.   ASSISTANT:  None.   ANESTHESIA:  General by IV sedation.   SPECIMENS:  No specimens.   PATHOLOGY:  No pathology.   ESTIMATED BLOOD LOSS:  No blood loss.   COMPLICATIONS:  No complications.  The patient went to PACU in good  condition.   DESCRIPTION OF PROCEDURE:  The procedure was done as follows:  With the  patient under sedation, the hip was reduced with hip flexion, mild  adduction and straight traction.  The hip popped into place.  Flexion,  extension,  abduction, and adduction actually showed the hip stable.  We  tried a push-pull test and  the hip stayed stable.  This was confirmed  under C-arm.   The patient was placed in an abduction pillow and taken to the recovery  room in stable condition.   DISPOSITION:  She will stay overnight for monitoring, since she had such  a large amount of sedation and also because of assessment of her  respiratory status after that sedation.   We will follow with a phone call to Dr. Simonne Come, M.D. tomorrow.      Vickki Hearing, M.D.  Electronically Signed     SEH/MEDQ  D:  09/24/2006  T:  09/25/2006  Job:  [id number]

## 2010-11-25 NOTE — Op Note (Signed)
[name], [name]                 ACCOUNT NO.:  [id number]   MEDICAL RECORD NO.:  [id number]          PATIENT TYPE:  INP   LOCATION:  0098                         FACILITY:  [facility]   PHYSICIAN:  Jene Every, M.D.    DATE OF BIRTH:  1951-04-24   DATE OF PROCEDURE:  12/20/2005  DATE OF DISCHARGE:                                 OPERATIVE REPORT   PREOPERATIVE DIAGNOSIS:  Dislocated right total hip replacement.   POSTOPERATIVE DIAGNOSIS:  Dislocated right total hip replacement.   PROCEDURE PERFORMED:  Closed reduction under anesthesia, right hip.   ANESTHESIA:  General.   ASSISTANT:  No assistant.   BRIEF HISTORY:  The patient is a 60 year old status post right total hip by  Dr. Simonne Come in 2001 who was stepping off an elliptical machine and  tripped, dislocated her hip.  Seen in the emergency room, and shortening and  internal rotation of her hip.  Radiographs indicated dislocation of the hip,  no fracture.  Operative intervention was indicated for closed reduction.  Risks and benefits discussed, including inability to reduce, need for open  reduction, etc.   TECHNIQUE:  The patient was in the supine position.  After induction of  adequate general anesthesia, gentle longitudinal traction, flexion, and  internal rotation was applied to the right lower extremity.  A reduction was  appreciated.  This restored her leg length, also the rotation.  The hip  under C-arm was found to be located.  There was no fracture or dissociation  of the components.  The components appeared to be in appropriate version.  She had good stability in flexion of 90 degrees, full extension, internal  and external rotation at 90 degrees of 30 degrees.   She was then placed on an abduction pillow, good pulses.  Extubated without  difficulty and transported to the respiratory rate in satisfactory  condition.   The patient tolerated the procedure well.  There were no complications.      Jene Every,  M.D.  Electronically Signed     JB/MEDQ  D:  12/20/2005  T:  12/20/2005  Job:  [id number]

## 2010-11-25 NOTE — Op Note (Signed)
NAMECHRISTI, [name]                           ACCOUNT NO.:  [id number]   MEDICAL RECORD NO.:  [id number]                   PATIENT TYPE:  AMB   LOCATION:  DAY                                  FACILITY:  [facility]   PHYSICIAN:  Oley Balm. Pricilla Holm, M.D.               DATE OF BIRTH:  1951-05-09   DATE OF PROCEDURE:  02/03/2002  DATE OF DISCHARGE:  02/03/2002                                 OPERATIVE REPORT   PREOPERATIVE DIAGNOSES:  1. Hallux valgus deformity bilaterally.  2. Hammertoe second digit bilaterally.   POSTOPERATIVE DIAGNOSES:  1. Hallux valgus deformity bilaterally.  2. Hammertoe second digit bilaterally.   PROCEDURE:  1. Austin bunionectomy bilaterally.  2. Arthroplasty interphalangeal fusion, second digit bilaterally.   SURGEON:  Emilio Math, D.P.M.   INDICATIONS FOR SURGERY:  Longstanding history of pain unrelieved by  conservative care.   DESCRIPTION OF PROCEDURE:  The patient brought in the operating room and  placed on the operating table in supine position.  The patient's lower feet  and legs were then prepped and draped in the usual manner.  Then with an  ankle tourniquet placed and well-padded to prevent contusion, I elevate to  250 mmHg.  After exsanguination of each foot, the following surgical  procedure was then performed under local standby anesthesia.   AUSTIN BUNIONECTOMY RIGHT FOOT:  Attention was directed to the right foot  level of the first MTP where a curvilinear incision made.  Incision run  deeply via sharp and blunt dissection, being sure to identify and retract  all vital structures.  A linear capsular incision was made, and the head of  the metatarsal freed from surrounding soft tissue attachments dorsally,  medially.  Utilizing a Zimmer oscillating saw, the medial eminence on the  medial aspect of the first metatarsal head was then resected.  Dissection  was then carried down deep in the first web space where a lateral fibular  sesamoidectomy  was performed.  Attention was then redirected to the medial  aspect of the first metatarsal head where an Austin-type osteotomy was made  from distal to proximal.  The capital fragment slid laterally.  The head of  the metatarsal was put in a more anatomical and correct functional position  and fixated with two .045 K-wires and after fixation, it was noted that the  osteotomy site was stable.  The remaining protruding aspect of the  metatarsal resected; all rough edges rasped smooth.  The wound washed with  copious amounts of sterile saline and the capsule and subcutaneous tissues  were reapproximated with __________ sutures of 4-0 Dexon.  Skin is  reapproximated utilizing running subcuticular suture of 4-0 Dexon.   AUSTIN BUNIONECTOMY LEFT FOOT:  A similar procedure was performed on the  left foot that was performed on the right foot with the exception the  fibular sesamoid was not removed.   ARTHROPLASTY INTERPHALANGEAL FUSION  SECOND TOE RIGHT FOOT:  Attention was  directed to the dorsal aspect of the right foot at the level of the  interphalangeal joint where two transverse similar elliptical converting  skin incisions were made.  Incision was widened deeply via sharp and blunt  dissection, being sure to identify and retract all vital structures.  A  capsular incision was then made, the head of the phalanx freed from  surrounding soft tissue attachments, and resected utilizing the Zimmer  oscillating saw.  The remaining protruding aspect of the middle phalanx was  resected, the two ends approximated and fixated with a .045 GK-wire.  After  fixation, it was noted that the osteotomy site and infusion site was stable,  the wound washed out with copious amounts of sterile saline, and the tendon  reapproximated utilizing one horizontal mattress suture of 4-0 Dexon.  Skin  reapproximated utilizing horizontal mattress sutures of 4-0 Prolene.   ARTHROPLASTY SECOND TOE LEFT FOOT:  Similar  procedure as described on the  second toe of the right foot was performed on the second toe of the left  foot with no additions or deletions.   All surgical sites were then infiltrated with approximately 18 cc of  dexamethasone phosphate and mild compressive bandage consisting of Betadine-  soaked Adaptic.  Sterile 4 x 4's and a sterile Kling were applied.  The  patient tolerated the procedure well and left the operating room in apparent  good condition, vital signs stable, to recovery room.                                               Oley Balm Pricilla Holm, M.D.    DBT/MEDQ  D:  02/03/2002  T:  02/06/2002  Job:  [id number]

## 2010-11-25 NOTE — Op Note (Signed)
[place]. [facility]  Patient:    [name], [name]                          MRN: [id number] Proc. Date: 09/13/99 Adm. Date:  [id number] Disc. Date: [id number] Attending:  Marlowe Kays Page                           Operative Report  PREOPERATIVE DIAGNOSIS:  Osteoarthritis, right hip.  POSTOPERATIVE DIAGNOSIS:  Osteoarthritis, right hip.  OPERATION:  Osteonics total hip arthroplasty, right with noncemented acetabular and femoral component.  SURGEON:  Illene Labrador. [place], M.D.  ASSISTANTS:  Georges Lynch. Gioffre, M.D. and Alexzandrew L. Perkins, P.A.-C.  ANESTHESIA:  General.  JUSTIFICATION FOR PROCEDURE:  She had complete destruction of the joint space with cystic changes in the acetabulum which may be on the femoral head.  DESCRIPTION OF PROCEDURE:  Prophylactic antibiotics and satisfactory general anesthesia.  Left lateral decubitus position on the [place] II frame.  The right hip was prepped with DuraPrep and draped in a sterile field.  Ioban employed. Usual posterolateral incision down to the fascia lata which was opened. Self-retaining retractor was inserted.  ______ band was cut.  External rotators were cut off the femur with cutting cautery, and the gluteus medius was partially detached and tagged with a #1 Ethibond.  The hip capsule was identified and a subtotal capsulectomy performed and hip dislocated.  Initial cut of femoral neck was made just below the femoral head and the piriformis fossa cleared of soft tissue.  A  guide pin was then placed down into the femur, overdrilled followed by a canal finder.  We then began the cylindrical reaming with preoperative templating, indicating probably a size 8 noncemented, and I want to go with noncemented components at her age of 28.  I used the greater trochanteric reamer as part of the reaming process at size 8.  I also placed the cutting guide for cutting the femoral neck with a second cut being  made a little longer than a fingerbreadth, since she had about 7/16 of an inch shortening of her right leg based on preoperative assessment.  She was advised preoperatively that stability was decreasing and we would place the appropriate size neck length to achieve stability, and try and regain as much length, and reestablish as much equality in leg lengths as possible.  After making the femoral neck cut and using the 8 cylindrical reamer, we went through the rasp, and indeed, an 8 rasp was the appropriate size.  I then reamed from the distal femoral tip up to 14 mm.  I went to the acetabulum where the capsulectomy was completed.  The acetabulum was cleared of soft tissue and I then began deepening and expanding reaming process, working up to a 48 mm which was hat was templated.  This seemed to be the appropriate size.  We then placed the Dual Geometry rim maker with a nice rim created.  After accessing the position of the cup with the turn of the guide, I placed the prominent cup, impacting it tightly.  We then went through a trial reduction with a 10-degree liner, with described line set at 11 oclock, and found the head to be  very stable with 127 degree angle, -3 neck.  Consequently, I went ahead and placed the ________ screw for the acetabulum followed by the final 10-degree liner, once again, with described line  at  11 oclock.  Then we returned to the femur where the Secur-Fit, size 8 with 14 mm tip was then inserted carefully to avoid any fractures.  I then went through another trial reduction, and actually found that a +0 neck length was the best it, which is what the final C-taper head was that we used.  The hip was then taken through a full range of motion and seemed to be stable with good coverage in all directions.  We then irrigated the wound well with antibiotic solution, as we had throughout the case.  The gluteus medius partial detachment was reapproximated  with interrupted #1 Ethibond.  The _______ was reconstituted with interrupted #1 Vicryl and external rotators through bone so drill holes and bone were the same.  The fascia lata was reapproximated with interrupted #1 Vicryl, the subcutaneous tissue with a combination of #1 and 2-0 Vicryl, and the skin with staples.  Betadine Adaptic ry sterile dressings were applied.  She was then gently placed on her back and an abduction pillow applied.  She had good pulses in the right foot.  X-ray to follow.  Estimated blood loss was approximately 500 cc.  No blood replacement. DD:  09/13/99 TD:  09/14/99 Job: [id number]

## 2010-11-25 NOTE — Discharge Summary (Signed)
[name], [name]                 ACCOUNT NO.:  [id number]   MEDICAL RECORD NO.:  [id number]          PATIENT TYPE:  INP   LOCATION:  1617                         FACILITY:  [facility]   PHYSICIAN:  Madlyn Frankel. Charlann Boxer, M.D.  DATE OF BIRTH:  1951-04-10   DATE OF ADMISSION:  07/08/2007  DATE OF DISCHARGE:  07/12/2007                               DISCHARGE SUMMARY   ADMITTING DIAGNOSES:  1. Right hip instability.  2. Osteoarthritis.  3. Hypertension.  4. Hypercholesteremia.   DISCHARGE DIAGNOSES:  1. Osteoarthritis.  2. Hypertension.  3. Hypercholesteremia.  4. Postoperative hypokalemia.  5. Acute blood loss anemia.   HISTORY OF PRESENT ILLNESS:  A 60 year old female with history of right  total hip replacement in 2001 with four dislocations over the past year.  She has had only minor discomfort, but she has had persistent  instability after these dislocations.  In addition she also has a  significant recent history of a left total knee replacement for which  she is doing very well.   CONSULTATION:  None.   PROCEDURES:  1. Revision right total hip placement.  2. Trochanteric osteotomy.  3. Repair osteotomy.  Surgeon was Dr. Durene Romans.  Assistant [employer] PA-C.   LABORATORY DATA:  Preadmission CBC:  Hematocrit 39.4 dropped down to  25.3.  Postop day #1 was given 2 units of blood.  Came back up and at  discharge was stable at 24.5.  White cell differential preadmission  neutrophils 40, neutrophil absolutes were 1.4, lymphs were 48.  At  discharge all within normal limits.  Coags:  INR 0.9.  Routine chemistry  preop all within normal limits.  Postop day #1 sodium 134, potassium  3.4, glucose 133.  At time of discharge sodium 138, potassium 3.3,  glucose 121.  Kidney function normal greater than 60 GFR with calcium 10  and at discharge greater than 60 GFR.  Calcium 7.8.  UA was negative.  As noted transfused 2 units of blood on the 30th.   RADIOLOGY:  Portable pelvis  showed postoperative changes, new hip  prosthesis.   Cardiology:  Previous EKG shows normal sinus rhythm.  No chest x-ray  found on chart.   HOSPITAL COURSE:  The patient underwent revision right total hip  replacement by surgeon Dr. Durene Romans.  Tolerated procedure well, and  was admitted to orthopedic floor.  Her stay was unremarkable.  She  remained afebrile throughout.  Her dressing was changed on a daily basis  without significant drainage to the wound, no sign of infection.  Hemovac was pulled on postop day #1.  She was neurovascularly intact in  the right lower extremity throughout.  Calf remained soft and nontender.  She was touchdown weightbearing 25% for four weeks.  We did give her an  IV fluid bolus due to some hypotensive type issues.  She did have a  little bit of difficulty sleeping.  Made very good progress with  physical therapy.  Able to ambulate at least 100 feet.  We did hold the  Lovenox due to a  little bit of ooze on the third day with plans to  recheck on the second prior to discharge.  When seen on the 2nd only  complaint was a runny nose. Dressing was changed.  Ooze had stopped.  Neurovascularly intact.  Held Lovenox for that day as well.  She was  ready for discharge home.   DISCHARGE DISPOSITION:  Discharged home with home health care PT and  Lovenox.   DISCHARGE CONDITION:  Stable and improved condition.   DISCHARGE DIET:  Regular as tolerated by the patient.   DISCHARGE WOUND CARE:  Keep wound dry.   DISCHARGE PHYSICAL THERAPY:  She is touchdown weightbearing 25% x4  weeks.   DISCHARGE MEDICATIONS:  1. Lovenox 40 mg subcu q.24 x10 days.  2. Vicodin 5/325 1-2 p.o. q.4-6 p.r.n. pain.  3. Robaxin 500 mg p.o. q.6.  4. Aspirin 325 mg one p.o. daily.  5. Iron 325 mg one p.o. t.i.d. x3 weeks.  6. Colace 100 mg p.o. b.i.d.  7. MiraLax 70 g p.o. daily.   HOME MEDICATIONS:  1. Dolobid 500 mg one p.o. b.i.d.  2. Bupropion 150 mg one p.o. b.i.d.  3.  Ambien 10 mg one p.o. p.r.n.  4. Simvastatin 20 mg one p.o. q.h.s.  5. Lisinopril HCTZ 20/25 one p.o. q.a.m.  6. Multivitamin p.o. daily.  7. Calcium p.o. daily.   DISCHARGE FOLLOWUP:  Follow up with Dr. Charlann Boxer at phone number [phone] in  10-14 days.     ______________________________  Yetta Glassman [address], [place]      Madlyn Frankel. Charlann Boxer, M.D.  Electronically Signed   BLM/MEDQ  D:  08/06/2007  T:  08/06/2007  Job:  [id number]

## 2010-11-25 NOTE — Discharge Summary (Signed)
NAMEAVION, [name]                 ACCOUNT NO.:  [id number]   MEDICAL RECORD NO.:  [id number]          PATIENT TYPE:  INP   LOCATION:  1605                         [facility]ACILITY:  [facility]   PHYSICIAN:  Madlyn Frankel. Charlann Boxer, M.D.  DATE OF BIRTH:  1951/02/04   DATE OF ADMISSION:  04/02/2007  DATE OF DISCHARGE:  04/05/2007                               DISCHARGE SUMMARY   ADMITTING DIAGNOSES:  1. Osteoarthritis.  2. Hypertension.  3. Hyperlipidemia.  4. Right total hip replacement in 2001.   DISCHARGE DIAGNOSES:  1. Osteoarthritis.  2. Hypertension.  3. Hyperlipidemia.  4. Right total hip replacement in 2001.   CONSULTS:  None.   PROCEDURE:  Left total knee replacement.  Surgeon:  Dr. Durene Romans.  Assistant:  Dwyane Luo.   BRIEF HISTORY OF PRESENT ILLNESS:  A 60 year old female with a history  of persistent progressive left knee pain secondary to osteoarthritis.  Refractory to all conservative treatments.  Presurgically assessed by  primary care physician, Dr. Phillips Odor.   LABORATORIES:  Preadmission CBC within normal limits.  At discharge,  hematocrit 26.5 and stable.  Coagulation normal.  Routine chemistry  preadmission normal; at discharge, no significant abnormalities.  Kidney  function normal.  Calcium 8.2 at discharge.  GI negative.  UA negative.   RADIOLOGY:  Chest two-view:  No acute findings.   EKG:  Normal sinus rhythm.   HOSPITAL COURSE:  The patient underwent left total knee replacement,  tolerated her procedure well, was admitted to the orthopedic floor.  She  remained afebrile throughout her course of stay.  She remained  neurovascularly intact throughout her course of stay with the ability to  do a straight leg raise before her discharge.  Dressing was changed on a  daily basis after postoperative day #1.  No significant drainage from  the wound.  She did have some right-sided lower extremity edema for  which we gave her some Lasix which helped significantly reduce the  edema.  She progressed nicely with her physical therapy.  She was  outfitted with a hinged knee brace due to some medial collateral laxity.  She was instructed to wear this when ambulating.  Day #3 DVT prophylaxis  had been ongoing for 2 days.  She was stable and improved, progressing  nicely with physical therapy and outfitted with a knee brace and ready  for discharge.   DISCHARGE DISPOSITION:  Discharge home with home healthcare PT and  Lovenox teaching.   DISCHARGE DIET:  Regular.   DISCHARGE WOUND CARE:  Keep dry.   DISCHARGE PHYSICAL THERAPY:  Weightbearing as tolerated with the use of  a rolling walker.  Also, use hinged knee brace for the next 6 weeks.  Goals of physical therapy will be work on gait training, proprioception,  minimize pain, maximize strength, increase range of motion with goals of  therapy being 0-100 at 2 weeks and 0-120 at 6 weeks.   DISCHARGE FOLLOWUP:  Follow up with Dr. Charlann Boxer at phone number [phone] in  2 weeks.   DISCHARGE MEDICATIONS:  1. Diflunisal 500 mg p.o. daily.  2. Bupropion  SR 150 mg one p.o. b.i.d.  3. Ambien 10 mg p.o. q.h.s. p.r.n.  4. Simvastatin 40 mg one-half tablet nightly.  5. Multivitamin daily.  6. Biotin 3000 mcg daily.  7. Vicodin 5/325 one to two p.o. q.4-6h. p.r.n. pain.  8. Lovenox 30 mg subcu q.12h. x11 days.  9. Robaxin 500 mg p.o. q.6h. muscle spasm.  10.Enteric-coated aspirin 325 mg one p.o. daily x4 weeks after Lovenox      completed.  11.Iron 325 mg one p.o. t.i.d. x2 weeks.  12.Colace 100 mg p.o. b.i.d.  13.MiraLax 15 g p.o. daily.  14.Lasix 20 mg one to two p.o. daily p.r.n. edema.     ______________________________  Yetta Glassman. [address], [place]      Madlyn Frankel. Charlann Boxer, M.D.  Electronically Signed    BLM/MEDQ  D:  04/23/2007  T:  04/24/2007  Job:  [id number]

## 2010-11-25 NOTE — Op Note (Signed)
   NAME:  [name], [name]                           ACCOUNT NO.:  [id number]   MEDICAL RECORD NO.:  [id number]                   PATIENT TYPE:  AMB   LOCATION:  DAY                                  FACILITY:  [facility]   PHYSICIAN:  R. Roetta Sessions, M.D.              DATE OF BIRTH:   DATE OF PROCEDURE:  10/20/2002  DATE OF DISCHARGE:                                 OPERATIVE REPORT   PROCEDURE:  Screening colonoscopy.   INDICATIONS:  The patient is a 60 year old lady seen at the request of Dr.  Dorthey Sawyer for screening colonoscopy.  She is devoid of any lower GI tract  symptoms or family history of colorectal neoplasia.  She has never had her  colon imaged.  Colonoscopy is now being done for standard screening  maneuver.  This approach has been discussed with the patient at length.  The  potential risks, benefits and alternatives have been reviewed.  Questions  answered.  Please see my handwritten H&P.   DESCRIPTION OF PROCEDURE:  Oxygen saturation, blood pressure, pulse and  respiration were monitored throughout the entire procedure.  Conscious  sedation with Versed 3 mg IV and Demerol 75 mg IV in divided doses.  The  instrument was the Olympus video tip adult colonoscope.   FINDINGS:  Digital rectal examination revealed no abnormalities.   ENDOSCOPIC FINDINGS:  Prep was adequate.  Rectum:  Examination of the rectal mucosa including retroflexion in the anal  verge revealed no abnormalities.  Colon:  Colonic mucosa was surveyed from the rectosigmoid junction to the  left, transverse,  right colon to the area of the appendiceal orifice,  ileocecal valve, and cecum.  The patient was noted to have left-sided  diverticula.  The remainder of the colonic mucosa to the cecum appeared  normal.  From the level of the cecum and ileocecal valve, the scope was  slowly withdrawn.  Previously mentioned mucosal surfaces were again seen and  no other abnormalities were observed.  The patient tolerated  the procedure  well and was reactive after endoscopy.   IMPRESSION:  1. Normal rectum.  2.     Left-sided diverticula.  3. The remainder of the colonic mucosa appeared normal.   RECOMMENDATIONS:  Repeat colonoscopy in 10 years.  Diverticulosis literature  provided to [name].                                                Jonathon Bellows, M.D.    RMR/MEDQ  D:  10/20/2002  T:  10/20/2002  Job:  [id number]   cc:   Corrie Mckusick, M.D.  [address] [place]. [place]  [place]   [phone]  Fax: [phone]

## 2010-11-25 NOTE — Discharge Summary (Signed)
[name], [name]                 ACCOUNT NO.:  [id number]   MEDICAL RECORD NO.:  [id number]          PATIENT TYPE:  INP   LOCATION:  A332                          FACILITY:  [facility]   PHYSICIAN:  Vickki Hearing, M.D.DATE OF BIRTH:  28-Jul-1950   DATE OF ADMISSION:  09/24/2006  DATE OF DISCHARGE:  03/18/2008LH                               DISCHARGE SUMMARY   ADMISSION DIAGNOSIS:  Dislocation of right total hip prosthesis.   HISTORY:  She is 60 years old.  She had her first right total hip done  in about 2001.  Had the first dislocation last summer which was reduced  by Dr. Jene Every.  She did well until yesterday.  She flexed her hip  up past to 90 degrees, then reached across her bed to put some clothes  on the bed and the hip re-dislocated.  She was brought to the emergency  room and an attempt at a closed reduction was performed there  unsuccessfully.  We used etomidate and fentanyl.  She was then taken to  the operating room where propofol was used and the hip reduced easily.  See the operative report.   HOSPITAL COURSE:  She was admitted on postop day one and had physical  therapy and could ambulate over 100 feet.  Had no neurovascular deficits  and fell fine.   FOLLOWUP:  I spoke with Dr. Marlowe Kays, to arrange followup for  her, as he was the original treating surgeon, and he decided to give her  an appointment on Friday,  October 05, 2006, at 9:00 a.m.  She will be out  of work until at least that date from September 25, 2006,  through October 05, 2006.   DISCHARGE INSTRUCTIONS:  1. She is to use a walker, full weightbearing.  2. Use abduction pillow while in bed.   DISCHARGE MEDICATIONS:  1. She is discharged on Vicodin 5 mg, one q.4h. p.r.n. for pain.  2. Other medications will include the following Wellbutrin XL, one      tablet orally q.h.s.  3. Zocor 20 mg p.o. daily.  4. Prednisone 7.5 mg p.o. daily.  5. She can resume her antihypertensive medication as  well, one a day.      (The patient does not have dosages for the Wellbutrin or the      unknown hypertensives.)   CONDITION ON DISCHARGE:  The patient is stable.      Vickki Hearing, M.D.  Electronically Signed     SEH/MEDQ  D:  09/25/2006  T:  09/25/2006  Job:  [id number]   cc:   Marlowe Kays, M.D.  Fax: [phone]

## 2010-11-25 NOTE — H&P (Signed)
[facility]  Patient:    [name], MULLAN Visit Number: [id number] MRN: [id number]          Service Type: Attending:  Oley Balm. Pricilla Holm, D.P.M. Dictated by:   Oley Balm Pricilla Holm, D.P.M.                           History and Physical  HISTORY OF PRESENT ILLNESS:  The patient is referred by Dr. Colette Ribas for evaluation of her feet.  The patient is a 60 year old black female who has had mainly complaints of pain in her bunion and her toes for a number of years; she has also had subtalar joint pain bilaterally.  The patient relates difficulty wearing enclosed shoes.  She has to wear steel-toed shoes at work and she finds it difficult wearing any type of enclosed shoes.  She also has a very long second toe where she gets a lesion on the end of the second toe because of its length and dorsally contracted.  PAST MEDICAL HISTORY:  Hysterectomy, hip replacement on right.  No transfusions or hepatitis.  MEDICATIONS: 1. ______  20/500 for hypertension. 2. Bextra 10 mg.  ALLERGIES:  She has no allergies.  REVIEW OF SYSTEMS:  History of arthritis and hypertension.  PHYSICAL EXAMINATION:  EXTREMITIES:  Lower extremity exam reveals palpable pedal pulses of DP and PT with spontaneous capillary filling time.  NEUROLOGIC:  Essentially within normal limits.  MUSCULOSKELETAL:  Exam reveals the patient has limited range of motion of both ankle joints.  She has pain to palpation over the sinus tarsi of both feet with limited range of motion in each subtalar joint.  There is hypertrophy of the medial eminence of the first metatarsal heads of both feet with lateral deviation of hallux consistent with hallux valgus deformities.  She has mild dorsally contracted and long second toes bilaterally.  LABORATORY AND ACCESSORY DATA:  X-rays reveal that the patient has a pes valgo planus deformity with exuberant bone formation on the posterior and anterior aspects of the  ankle joint.  There appears to also be a fractured posterior process of the talus.  The first MTP shows that the patient has increased intermetatarsal angle with lateral deviation of the hallux consistent hallux valgus deformity.  There are also long second metatarsals bilaterally.  ASSESSMENT: 1. Osteoarthritis of feet bilaterally. 2. Hallux valgus deformities bilaterally. 3. Long and contracted second toes bilaterally.  PLAN:  I reviewed with the patient surgical and conservative management.  The patient would like to go ahead and have the problem surgically corrected.  I reviewed with her the procedures and the complications of the procedures such as infection, bone infection, postop pain, swelling, etc.  The patient seems to understand the same and surgery has been scheduled for February 03, 2002. Dictated by:   Oley Balm Pricilla Holm, D.P.M. Attending:  Oley Balm. Pricilla Holm, D.P.M. DD:  02/01/02 TD:  02/03/02 Job: [id number]

## 2011-03-30 LAB — CBC
MCHC: 34.5
RDW: 15.1

## 2011-03-30 LAB — DIFFERENTIAL
Basophils Absolute: 0
Basophils Relative: 0
Eosinophils Absolute: 0.3
Monocytes Absolute: 0.4
Neutro Abs: 3.4
Neutrophils Relative %: 53

## 2011-03-30 LAB — HEMOGLOBIN AND HEMATOCRIT, BLOOD: Hemoglobin: 8.4 — ABNORMAL LOW

## 2011-04-14 LAB — BASIC METABOLIC PANEL
BUN: 11
BUN: 8
CO2: 31
CO2: 31
Calcium: 10
Calcium: 7.8 — ABNORMAL LOW
Calcium: 8.3 — ABNORMAL LOW
Creatinine, Ser: 0.73
GFR calc Af Amer: >60
GFR calc non Af Amer: >60
GFR calc non Af Amer: >60
GFR calc non Af Amer: >60
Glucose, Bld: 121 — ABNORMAL HIGH
Glucose, Bld: 133 — ABNORMAL HIGH
Potassium: 4.6
Sodium: 138
Sodium: 141

## 2011-04-14 LAB — TYPE AND SCREEN: ABO/RH(D): O NEG

## 2011-04-14 LAB — DIFFERENTIAL
Basophils Absolute: 0
Eosinophils Relative: 3
Lymphocytes Relative: 48 — ABNORMAL HIGH
Lymphs Abs: 1.6
Neutro Abs: 1.4 — ABNORMAL LOW

## 2011-04-14 LAB — CBC
HCT: 25.3 — ABNORMAL LOW
HCT: 39.4
Hemoglobin: 8.7 — ABNORMAL LOW
Hemoglobin: 9.4 — ABNORMAL LOW
MCHC: 34.3
MCHC: 35
Platelets: 181
Platelets: 301
RDW: 14.4
RDW: 15.4
WBC: 3.4 — ABNORMAL LOW

## 2011-04-14 LAB — URINALYSIS, ROUTINE W REFLEX MICROSCOPIC
Ketones, ur: NEGATIVE
Nitrite: NEGATIVE
Protein, ur: NEGATIVE
Urobilinogen, UA: 0.2

## 2011-04-14 LAB — PROTIME-INR
INR: 0.9
Prothrombin Time: 12.7

## 2011-04-14 LAB — APTT: aPTT: 31

## 2011-04-18 LAB — CBC
HCT: 35.1 — ABNORMAL LOW
Hemoglobin: 11.8 — ABNORMAL LOW
RBC: 4.13
WBC: 6

## 2011-04-18 LAB — DIFFERENTIAL
Lymphocytes Relative: 20
Lymphs Abs: 1.2
Neutro Abs: 4.4
Neutrophils Relative %: 73

## 2011-04-18 LAB — BASIC METABOLIC PANEL
Calcium: 10
GFR calc Af Amer: >60
GFR calc non Af Amer: >60
Potassium: 3.8
Sodium: 141

## 2011-04-18 LAB — PROTIME-INR
INR: 1
Prothrombin Time: 13.6

## 2011-04-20 LAB — COMPREHENSIVE METABOLIC PANEL
ALT: 23
AST: 24
CO2: 29
Calcium: 9.6
GFR calc Af Amer: >60
GFR calc non Af Amer: >60
Sodium: 141

## 2011-04-20 LAB — BASIC METABOLIC PANEL
CO2: 27
Calcium: 8.4
Chloride: 109
Chloride: 111
Creatinine, Ser: 0.76
GFR calc Af Amer: >60
GFR calc Af Amer: >60
Sodium: 142

## 2011-04-20 LAB — URINALYSIS, ROUTINE W REFLEX MICROSCOPIC
Bilirubin Urine: NEGATIVE
Nitrite: NEGATIVE
Specific Gravity, Urine: 1.009
Urobilinogen, UA: 0.2

## 2011-04-20 LAB — CBC
Hemoglobin: 9 — ABNORMAL LOW
MCHC: 33.5
MCHC: 34
MCV: 84.4
RBC: 3.14 — ABNORMAL LOW
RBC: 3.53 — ABNORMAL LOW
RBC: 4.62
WBC: 4.3
WBC: 5.8

## 2011-04-20 LAB — ABO/RH: ABO/RH(D): O NEG

## 2011-04-20 LAB — TYPE AND SCREEN
ABO/RH(D): O NEG
Antibody Screen: NEGATIVE

## 2011-04-26 LAB — CBC
Hemoglobin: 12.9
MCHC: 33.1
MCV: 87.9
RDW: 13.2

## 2011-04-26 LAB — DIFFERENTIAL
Basophils Absolute: 0
Basophils Relative: 1
Eosinophils Absolute: 0.1
Monocytes Absolute: 0.3
Neutro Abs: 3

## 2011-04-26 LAB — BASIC METABOLIC PANEL
CO2: 25
Calcium: 9.1
Creatinine, Ser: 0.76
Glucose, Bld: 100 — ABNORMAL HIGH
Sodium: 137

## 2011-05-12 LAB — COMPREHENSIVE METABOLIC PANEL
AST: 21 U/L
BUN: 14 mg/dL (ref 4–21)
Calcium: 10 mg/dL

## 2011-05-12 LAB — CBC WITH DIFFERENTIAL/PLATELET
WBC: 3.8
platelet count: 289

## 2011-05-16 ENCOUNTER — Telehealth: Payer: Self-pay

## 2011-05-16 NOTE — Telephone Encounter (Signed)
LMOM for pt to call. (PLEASE NOTE MED LIST, POSSIBLY NEEDS OV PRIOR TO PROCEDURE.

## 2011-05-16 NOTE — Telephone Encounter (Signed)
Pt called. Not due til 10/2012 for colonoscopy. She said she told Dr. Phillips Odor she has seen blood in her stool several times and is concerned. She is very busy now on and would like to schedule OV appt week of Thanksgiving. Thinks they are going to be off the entire week. She is scheduled to see Lorenza Burton, NP on 11/192012 @ 8:00 AM. She will call and reschedule if she needs to.

## 2011-05-26 ENCOUNTER — Encounter: Payer: Self-pay | Admitting: Internal Medicine

## 2011-05-29 ENCOUNTER — Ambulatory Visit: Payer: BC Managed Care – PPO | Admitting: Gastroenterology

## 2011-06-09 ENCOUNTER — Other Ambulatory Visit (HOSPITAL_COMMUNITY): Payer: Self-pay | Admitting: Orthopedic Surgery

## 2011-06-09 DIAGNOSIS — M25551 Pain in right hip: Secondary | ICD-10-CM

## 2011-06-12 ENCOUNTER — Other Ambulatory Visit (HOSPITAL_COMMUNITY): Payer: Self-pay | Admitting: Family Medicine

## 2011-06-12 DIAGNOSIS — Z139 Encounter for screening, unspecified: Secondary | ICD-10-CM

## 2011-06-15 ENCOUNTER — Ambulatory Visit: Payer: BC Managed Care – PPO | Admitting: Gastroenterology

## 2011-06-22 ENCOUNTER — Other Ambulatory Visit: Payer: Self-pay | Admitting: Gastroenterology

## 2011-06-22 ENCOUNTER — Ambulatory Visit (INDEPENDENT_AMBULATORY_CARE_PROVIDER_SITE_OTHER): Payer: BC Managed Care – PPO | Admitting: Gastroenterology

## 2011-06-22 ENCOUNTER — Encounter: Payer: Self-pay | Admitting: Gastroenterology

## 2011-06-22 VITALS — BP 111/70 | HR 51 | Temp 97.3°F | Ht 62.0 in | Wt 205.4 lb

## 2011-06-22 DIAGNOSIS — K625 Hemorrhage of anus and rectum: Secondary | ICD-10-CM

## 2011-06-22 DIAGNOSIS — K5900 Constipation, unspecified: Secondary | ICD-10-CM

## 2011-06-22 DIAGNOSIS — Z83719 Family history of colon polyps, unspecified: Secondary | ICD-10-CM | POA: Insufficient documentation

## 2011-06-22 DIAGNOSIS — Z8371 Family history of colonic polyps: Secondary | ICD-10-CM

## 2011-06-22 MED ORDER — PEG-KCL-NACL-NASULF-NA ASC-C 100 G PO SOLR: 1.0000 | Freq: Once | ORAL | Status: DC

## 2011-06-22 NOTE — Progress Notes (Signed)
Cc to PCP 

## 2011-06-22 NOTE — Assessment & Plan Note (Signed)
Increased problems with constipation, intermittent brbpr, FH colon polyps (sister). Last TCS 2004. Recommend colonoscopy for diagnostic purposes. Continue dietary fiber, fiber supplement, daily miralax. Few days before prep she will increase her miralax to bid.  I have discussed the risks, alternatives, benefits with regards to but not limited to the risk of reaction to medication, bleeding, infection, perforation and the patient is agreeable to proceed. Written consent to be obtained.  Will request copy of last TSH, calcium levels.

## 2011-06-22 NOTE — Progress Notes (Signed)
Primary Care Physician:  Colette Ribas, MD  Primary Gastroenterologist:  Roetta Sessions, MD   Chief Complaint  Patient presents with  . Colonoscopy    HPI:  [name] is a 60 y.o. female here to schedule a colonoscopy. She had one in 2004 which showed left-sided diverticula. Sister has h/o colon polyps, gets colonoscopies every five years. Patient has noted increased problems with constipation. If doen't take Miralax once daily then may go a whole week without BM. Having BM only 2-3 times per week on daily Miralax and fiber supplement. Eating high fiber diet. Drinks a lot of juices and very little caffeine. Intermittent fresh blood in stool not associated with hard stool or straining. No melena. No abdominal pain. Appetite ok, off a little due to work schedule. Intentional weight loss of 20 pounds. No heartburn or vomiting. No dysphagia.   Current Outpatient Prescriptions  Medication Sig Dispense Refill  . buPROPion (WELLBUTRIN XL) 300 MG 24 hr tablet Take 300 mg by mouth as needed.       Marland Kitchen lisinopril-hydrochlorothiazide (PRINZIDE,ZESTORETIC) 20-12.5 MG per tablet Take 1 tablet by mouth daily.        . nabumetone (RELAFEN) 750 MG tablet Take 750 mg by mouth 2 (two) times daily.        . polyethylene glycol (MIRALAX / GLYCOLAX) packet Take 17 g by mouth daily.        . Psyllium (RA FIBER SUPPLEMENT PO) Take 1 capsule by mouth 2 (two) times daily.        Marland Kitchen zolpidem (AMBIEN) 10 MG tablet Take 10 mg by mouth at bedtime as needed.          Allergies as of 06/22/2011  . (No Known Allergies)    Past Medical History  Diagnosis Date  . Arthritis, rheumatoid   . HTN (hypertension)   . Hyperlipidemia   . Diabetes mellitus     diet controlled  . Vitamin D deficiency   . Osteoarthritis     Past Surgical History  Procedure Date  . Colonoscopy 10/20/02    normal rectum/left-sided diverticula  . Total hip arthroplasty 2001/2008    multiple surgeries on right hip including  replacements. Two replacements and four dislocations.   . Total knee arthroplasty 2008    left  . Partial hysterectomy 1989  . Toe surgery     second toe on both feet  . Total hip arthroplasty 2010    left    Family History  Problem Relation Age of Onset  . Colon polyps Sister     History   Social History  . Marital Status: Divorced    Spouse Name: N/A    Number of Children: 1  . Years of Education: N/A   Occupational History  .  [employer]   Social History Main Topics  . Smoking status: Never Smoker   . Smokeless tobacco: Not on file  . Alcohol Use: No  . Drug Use: No  . Sexually Active: Not on file   Other Topics Concern  . Not on file   Social History Narrative  . No narrative on file      ROS:  General: Negative for anorexia, weight loss, fever, chills, fatigue, weakness. Eyes: Negative for vision changes.  ENT: Negative for hoarseness, difficulty swallowing , nasal congestion. CV: Negative for chest pain, angina, palpitations, dyspnea on exertion, peripheral edema.  Respiratory: Negative for dyspnea at rest, dyspnea on exertion, cough, sputum, wheezing.  GI: See history of present illness.  GU:  Negative for dysuria, hematuria, urinary incontinence, urinary frequency, nocturnal urination.  MS: Positive for joint pain.  Derm: Negative for rash or itching.  Neuro: Negative for weakness, abnormal sensation, seizure, frequent headaches, memory loss, confusion.  Psych: Negative for anxiety, depression, suicidal ideation, hallucinations.  Endo: Negative for unusual weight change.  Heme: Negative for bruising or bleeding. Allergy: Negative for rash or hives.    Physical Examination:  BP 111/70  Pulse 51  Temp(Src) 97.3 F (36.3 C) (Temporal)  Ht 5\' 2"  (1.575 m)  Wt 205 lb 6.4 oz (93.169 kg)  BMI 37.57 kg/m2   General: Well-nourished, well-developed in no acute distress.  Head: Normocephalic, atraumatic.   Eyes: Conjunctiva pink, no  icterus. Mouth: Oropharyngeal mucosa moist and pink , no lesions erythema or exudate. Neck: Supple without thyromegaly, masses, or lymphadenopathy.  Lungs: Clear to auscultation bilaterally.  Heart: Regular rate and rhythm, no murmurs rubs or gallops.  Abdomen: Bowel sounds are normal, nontender, nondistended, no hepatosplenomegaly or masses, no abdominal bruits or hernia , no rebound or guarding.   Rectal: Defer to time of colonoscopy. Extremities: No lower extremity edema. No clubbing or deformities.  Neuro: Alert and oriented x 4 , grossly normal neurologically.  Skin: Warm and dry, no rash or jaundice.   Psych: Alert and cooperative, normal mood and affect.  Labs: HgbA1C: 5.7 (05/2011)  Imaging Studies: No results found.

## 2011-06-22 NOTE — Patient Instructions (Signed)
Continue MiraLax as before. However, several days before your colonoscopy, increase MiraLax to twice daily. I will request a copy of your last thyroid and calcium levels from your family doctor for review. We have scheduled you for a colonoscopy, please see separate instructions.  Constipation in Adults Constipation is having fewer than 2 bowel movements per week. Usually, the stools are hard. As we grow older, constipation is more common. If you try to fix constipation with laxatives, the problem may get worse. This is because laxatives taken over a long period of time make the colon muscles weaker. A low-fiber diet, not taking in enough fluids, and taking some medicines may make these problems worse. MEDICATIONS THAT MAY CAUSE CONSTIPATION  Water pills (diuretics).   Calcium channel blockers (used to control blood pressure and for the heart).   Certain pain medicines (narcotics).   Anticholinergics.   Anti-inflammatory agents.   Antacids that contain aluminum.  DISEASES THAT CONTRIBUTE TO CONSTIPATION  Diabetes.   Parkinson's disease.   Dementia.   Stroke.   Depression.   Illnesses that cause problems with salt and water metabolism.  HOME CARE INSTRUCTIONS   Constipation is usually best cared for without medicines. Increasing dietary fiber and eating more fruits and vegetables is the best way to manage constipation.   Slowly increase fiber intake to 25 to 38 grams per day. Whole grains, fruits, vegetables, and legumes are good sources of fiber. A dietitian can further help you incorporate high-fiber foods into your diet.   Drink enough water and fluids to keep your urine clear or pale yellow.   A fiber supplement may be added to your diet if you cannot get enough fiber from foods.   Increasing your activities also helps improve regularity.   Suppositories, as suggested by your caregiver, will also help. If you are using antacids, such as aluminum or calcium containing  products, it will be helpful to switch to products containing magnesium if your caregiver says it is okay.   If you have been given a liquid injection (enema) today, this is only a temporary measure. It should not be relied on for treatment of longstanding (chronic) constipation.   Stronger measures, such as magnesium sulfate, should be avoided if possible. This may cause uncontrollable diarrhea. Using magnesium sulfate may not allow you time to make it to the bathroom.  SEEK IMMEDIATE MEDICAL CARE IF:   There is bright red blood in the stool.   The constipation stays for more than 4 days.   There is belly (abdominal) or rectal pain.   You do not seem to be getting better.   You have any questions or concerns.  MAKE SURE YOU:   Understand these instructions.   Will watch your condition.   Will get help right away if you are not doing well or get worse.  Document Released: 03/24/2004 Document Revised: 03/08/2011 Document Reviewed: 02/12/2008 [facility] Patient Information 2012 [place], [place].   High Fiber Diet A high fiber diet changes your normal diet to include more whole grains, legumes, fruits, and vegetables. Changes in the diet involve replacing refined carbohydrates with unrefined foods. The calorie level of the diet is essentially unchanged. The Dietary Reference Intake (recommended amount) for adult males is 38 g per day. For adult females, it is 25 g per day. Pregnant and lactating women should consume 28 g of fiber per day. Fiber is the intact part of a plant that is not broken down during digestion. Functional fiber is fiber that  has been isolated from the plant to provide a beneficial effect in the body. PURPOSE  Increase stool bulk.   Ease and regulate bowel movements.   Lower cholesterol.  INDICATIONS THAT YOU NEED MORE FIBER  Constipation and hemorrhoids.   Uncomplicated diverticulosis (intestine condition) and irritable bowel syndrome.   Weight management.     As a protective measure against hardening of the arteries (atherosclerosis), diabetes, and cancer.  NOTE OF CAUTION If you have a digestive or bowel problem, ask your caregiver for advice before adding high fiber foods to your diet. Some of the following medical problems are such that a high fiber diet should not be used without consulting your caregiver:  Acute diverticulitis (intestine infection).   Partial small bowel obstructions.   Complicated diverticular disease involving bleeding, rupture (perforation), or abscess (boil, furuncle).   Presence of autonomic neuropathy (nerve damage) or gastric paresis (stomach cannot empty itself).  GUIDELINES FOR INCREASING FIBER  Start adding fiber to the diet slowly. A gradual increase of about 5 more grams (2 slices of whole-wheat bread, 2 servings of most fruits or vegetables, or 1 bowl of high fiber cereal) per day is best. Too rapid an increase in fiber may result in constipation, flatulence, and bloating.   Drink enough water and fluids to keep your urine clear or pale yellow. Water, juice, or caffeine-free drinks are recommended. Not drinking enough fluid may cause constipation.   Eat a variety of high fiber foods rather than one type of fiber.   Try to increase your intake of fiber through using high fiber foods rather than fiber pills or supplements that contain small amounts of fiber.   The goal is to change the types of food eaten. Do not supplement your present diet with high fiber foods, but replace foods in your present diet.  INCLUDE A VARIETY OF FIBER SOURCES  Replace refined and processed grains with whole grains, canned fruits with fresh fruits, and incorporate other fiber sources. White rice, white breads, and most bakery goods contain little or no fiber.   Brown whole-grain rice, buckwheat oats, and many fruits and vegetables are all good sources of fiber. These include: broccoli, Brussels sprouts, cabbage, cauliflower,  beets, sweet potatoes, white potatoes (skin on), carrots, tomatoes, eggplant, squash, berries, fresh fruits, and dried fruits.   Cereals appear to be the richest source of fiber. Cereal fiber is found in whole grains and bran. Bran is the fiber-rich outer coat of cereal grain, which is largely removed in refining. In whole-grain cereals, the bran remains. In breakfast cereals, the largest amount of fiber is found in those with "bran" in their names. The fiber content is sometimes indicated on the label.   You may need to include additional fruits and vegetables each day.   In baking, for 1 cup white flour, you may use the following substitutions:   1 cup whole-wheat flour minus 2 tbs.    cup white flour plus  cup whole-wheat flour.  Document Released: 06/26/2005 Document Revised: 03/08/2011 Document Reviewed: 05/04/2009 [facility] Patient Information 2012 [place], [place].

## 2011-06-23 ENCOUNTER — Encounter (HOSPITAL_COMMUNITY)
Admission: RE | Admit: 2011-06-23 | Discharge: 2011-06-23 | Disposition: A | Discharge status: Home / Self Care | Payer: BC Managed Care – PPO | Source: Ambulatory Visit | Attending: Orthopedic Surgery | Admitting: Orthopedic Surgery

## 2011-06-23 DIAGNOSIS — Z96649 Presence of unspecified artificial hip joint: Secondary | ICD-10-CM | POA: Insufficient documentation

## 2011-06-23 DIAGNOSIS — M25559 Pain in unspecified hip: Secondary | ICD-10-CM | POA: Insufficient documentation

## 2011-06-23 DIAGNOSIS — M25551 Pain in right hip: Secondary | ICD-10-CM

## 2011-06-23 MED ORDER — TECHNETIUM TC 99M MEDRONATE IV KIT
25.0000 | PACK | Freq: Once | INTRAVENOUS | Status: AC | PRN
  Administered 2011-06-23: 24 via INTRAVENOUS

## 2011-06-26 ENCOUNTER — Telehealth: Payer: Self-pay

## 2011-06-26 NOTE — Telephone Encounter (Signed)
Pt called- her moviprep is too expensive. Her copay is $66.00, pt wants to know if we can send a cheaper prep to her pharmacy. I looked to see if we had any samples and we do not have any. pts procedure is scheduled for 07/06/11.

## 2011-06-27 ENCOUNTER — Other Ambulatory Visit: Payer: Self-pay | Admitting: Gastroenterology

## 2011-06-27 ENCOUNTER — Encounter: Payer: Self-pay | Admitting: Gastroenterology

## 2011-06-27 DIAGNOSIS — K625 Hemorrhage of anus and rectum: Secondary | ICD-10-CM

## 2011-06-27 MED ORDER — PEG 3350-KCL-NA BICARB-NACL 420 G PO SOLR: ORAL | Status: AC

## 2011-06-27 NOTE — Telephone Encounter (Signed)
Will change to [employer] and mail new instructions

## 2011-06-28 ENCOUNTER — Encounter (HOSPITAL_COMMUNITY): Payer: Self-pay | Admitting: Pharmacy Technician

## 2011-07-06 ENCOUNTER — Encounter (HOSPITAL_COMMUNITY): Payer: Self-pay | Admitting: *Deleted

## 2011-07-06 ENCOUNTER — Ambulatory Visit (HOSPITAL_COMMUNITY)
Admission: RE | Admit: 2011-07-06 | Discharge: 2011-07-06 | Disposition: A | Discharge status: Home / Self Care | Payer: BC Managed Care – PPO | Source: Ambulatory Visit | Attending: Internal Medicine | Admitting: Internal Medicine

## 2011-07-06 ENCOUNTER — Other Ambulatory Visit: Payer: Self-pay | Admitting: Internal Medicine

## 2011-07-06 ENCOUNTER — Encounter (HOSPITAL_COMMUNITY)
Admission: RE | Disposition: A | Discharge status: Home / Self Care | Payer: Self-pay | Source: Ambulatory Visit | Attending: Internal Medicine

## 2011-07-06 DIAGNOSIS — K5909 Other constipation: Secondary | ICD-10-CM | POA: Insufficient documentation

## 2011-07-06 DIAGNOSIS — K625 Hemorrhage of anus and rectum: Secondary | ICD-10-CM

## 2011-07-06 DIAGNOSIS — K620 Anal polyp: Secondary | ICD-10-CM

## 2011-07-06 DIAGNOSIS — K5900 Constipation, unspecified: Secondary | ICD-10-CM

## 2011-07-06 DIAGNOSIS — D128 Benign neoplasm of rectum: Secondary | ICD-10-CM | POA: Insufficient documentation

## 2011-07-06 DIAGNOSIS — E119 Type 2 diabetes mellitus without complications: Secondary | ICD-10-CM | POA: Insufficient documentation

## 2011-07-06 DIAGNOSIS — D126 Benign neoplasm of colon, unspecified: Secondary | ICD-10-CM

## 2011-07-06 DIAGNOSIS — K621 Rectal polyp: Secondary | ICD-10-CM

## 2011-07-06 DIAGNOSIS — K5730 Diverticulosis of large intestine without perforation or abscess without bleeding: Secondary | ICD-10-CM | POA: Insufficient documentation

## 2011-07-06 DIAGNOSIS — K921 Melena: Secondary | ICD-10-CM

## 2011-07-06 DIAGNOSIS — D129 Benign neoplasm of anus and anal canal: Secondary | ICD-10-CM | POA: Insufficient documentation

## 2011-07-06 DIAGNOSIS — Z79899 Other long term (current) drug therapy: Secondary | ICD-10-CM | POA: Insufficient documentation

## 2011-07-06 DIAGNOSIS — I10 Essential (primary) hypertension: Secondary | ICD-10-CM | POA: Insufficient documentation

## 2011-07-06 DIAGNOSIS — E785 Hyperlipidemia, unspecified: Secondary | ICD-10-CM | POA: Insufficient documentation

## 2011-07-06 HISTORY — PX: COLONOSCOPY: SHX5424

## 2011-07-06 SURGERY — COLONOSCOPY
Anesthesia: Moderate Sedation

## 2011-07-06 MED ORDER — MEPERIDINE HCL 100 MG/ML IJ SOLN
INTRAMUSCULAR | Status: AC
  Filled 2011-07-06: qty 1

## 2011-07-06 MED ORDER — MEPERIDINE HCL 100 MG/ML IJ SOLN
INTRAMUSCULAR | Status: DC | PRN
  Administered 2011-07-06: 50 mg via INTRAVENOUS

## 2011-07-06 MED ORDER — SODIUM CHLORIDE 0.45 % IV SOLN
Freq: Once | INTRAVENOUS | Status: AC
  Administered 2011-07-06: 14:00:00 via INTRAVENOUS

## 2011-07-06 MED ORDER — MIDAZOLAM HCL 5 MG/5ML IJ SOLN
INTRAMUSCULAR | Status: DC | PRN
  Administered 2011-07-06: 2 mg via INTRAVENOUS
  Administered 2011-07-06: 1 mg via INTRAVENOUS

## 2011-07-06 MED ORDER — HYDROCORTISONE ACETATE 25 MG RE SUPP: 25.0000 mg | Freq: Two times a day (BID) | RECTAL | Status: AC

## 2011-07-06 MED ORDER — STERILE WATER FOR IRRIGATION IR SOLN
Status: DC | PRN
  Administered 2011-07-06: 14:00:00

## 2011-07-06 MED ORDER — MIDAZOLAM HCL 5 MG/5ML IJ SOLN
INTRAMUSCULAR | Status: AC
  Filled 2011-07-06: qty 10

## 2011-07-06 NOTE — H&P (Signed)
  I have seen & examined the patient prior to the procedure(s) today and reviewed the history and physical/consultation.  There have been no changes.  After consideration of the risks, benefits, alternatives and imponderables, the patient has consented to the procedure(s).   

## 2011-07-06 NOTE — Op Note (Signed)
[facility] [address] [place], [place]  [address]  COLONOSCOPY PROCEDURE REPORT  PATIENT:  [name], [name]  MR#:  [id number] BIRTHDATE:  April 25, 1951, 60 yrs. old  GENDER:  female ENDOSCOPIST:  R. Roetta Sessions, MD FACP [facility] REF. BY:          Dr. Assunta Found PROCEDURE DATE:  07/06/2011 PROCEDURE:  diagnostic colonoscopy / colonoscopy with biopsy  INDICATIONS:  hematochezia / constipation  INFORMED CONSENT:  The risks, benefits, alternatives and imponderables including but not limited to bleeding, perforation as well as the possibility of a missed lesion have been reviewed. The potential for biopsy, lesion removal, etc. have also been discussed.  Questions have been answered.  All parties agreeable. Please see the history and physical in the medical record for more information.  MEDICATIONS:  Versed 3 mg IV and Demerol 50 mg IV in divided doses  DESCRIPTION OF PROCEDURE:  After a digital rectal exam was performed, the EC-[id number] ([id number]) colonoscope was advanced from the anus through the rectum and colon to the area of the cecum, ileocecal valve and appendiceal orifice.  The cecum was deeply intubated.  These structures were well-seen and photographed for the record.  From the level of the cecum and ileocecal valve, the scope was slowly and cautiously withdrawn.  The mucosal surfaces were carefully surveyed utilizing scope tip deflection to facilitate fold flattening as needed.  The scope was pulled down into the rectum where a thorough examination including retroflexion was performed. <<PROCEDUREIMAGES>>  FINDINGS:    Adequate preparation.; Somewhat friable and canal/minimal hemorrhoids. Single diminutive polyp at 5 cm in from the anal verge.             Left-sided diverticulosis. Single diminutive polyp in the transverse colon; remainder of colon appeaed normal.  THERAPEUTIC / DIAGNOSTIC MANEUVERS PERFORMED:Both polyps were removed.  COMPLICATIONS:   none  CECAL WITHDRAWAL TIME:  10 minutes  IMPRESSION:    Friable anal canal hemorrhoids. Diminutive rectal and transverse colon polyps -  removed as described above. Left-sided                   diverticulosis  RECOMMENDATIONS:   Follow up on pathology. Daily fiber supplement such as Benefiber 2 tablespoons. Liberalize use of MiraLax taking every day as needed to facilitate a bowel movement at least every other day or 3 times weekly.      One-week course of Anusol suppositories.  ______________________________ R. Roetta Sessions, MD Caleen Essex  CC:  Assunta Found, MD  n. eSIGNED:   R. Roetta Sessions at 07/06/2011 02:41 PM  [place]rence [place], [phone]

## 2011-07-10 ENCOUNTER — Ambulatory Visit (HOSPITAL_COMMUNITY)
Admission: RE | Admit: 2011-07-10 | Discharge: 2011-07-10 | Disposition: A | Discharge status: Home / Self Care | Payer: BC Managed Care – PPO | Source: Ambulatory Visit | Attending: Family Medicine | Admitting: Family Medicine

## 2011-07-10 DIAGNOSIS — Z139 Encounter for screening, unspecified: Secondary | ICD-10-CM

## 2011-07-10 DIAGNOSIS — Z1231 Encounter for screening mammogram for malignant neoplasm of breast: Secondary | ICD-10-CM | POA: Insufficient documentation

## 2011-07-17 ENCOUNTER — Encounter (HOSPITAL_COMMUNITY): Payer: Self-pay | Admitting: Internal Medicine

## 2011-08-29 ENCOUNTER — Encounter (HOSPITAL_COMMUNITY): Payer: Self-pay | Admitting: Pharmacy Technician

## 2011-08-30 ENCOUNTER — Encounter (HOSPITAL_COMMUNITY): Payer: Self-pay

## 2011-08-30 ENCOUNTER — Encounter (HOSPITAL_COMMUNITY)
Admission: RE | Admit: 2011-08-30 | Discharge: 2011-08-30 | Disposition: A | Discharge status: Home / Self Care | Payer: Managed Care, Other (non HMO) | Source: Ambulatory Visit | Attending: Orthopedic Surgery | Admitting: Orthopedic Surgery

## 2011-08-30 HISTORY — DX: Other complications of anesthesia, initial encounter: T88.59XA

## 2011-08-30 HISTORY — DX: Adverse effect of unspecified anesthetic, initial encounter: T41.45XA

## 2011-08-30 LAB — APTT: aPTT: 32 seconds (ref 24–37)

## 2011-08-30 LAB — DIFFERENTIAL
Basophils Relative: 0 % (ref 0–1)
Eosinophils Absolute: 0.2 10*3/uL (ref 0.0–0.7)
Eosinophils Relative: 3 % (ref 0–5)
Neutrophils Relative %: 53 % (ref 43–77)

## 2011-08-30 LAB — URINALYSIS, ROUTINE W REFLEX MICROSCOPIC
Bilirubin Urine: NEGATIVE
Hgb urine dipstick: NEGATIVE
Ketones, ur: NEGATIVE mg/dL
Nitrite: NEGATIVE
Urobilinogen, UA: 0.2 mg/dL (ref 0.0–1.0)
pH: 7.5 (ref 5.0–8.0)

## 2011-08-30 LAB — COMPREHENSIVE METABOLIC PANEL
Alkaline Phosphatase: 98 U/L (ref 39–117)
BUN: 12 mg/dL (ref 6–23)
CO2: 30 mEq/L (ref 19–32)
Chloride: 101 mEq/L (ref 96–112)
GFR calc Af Amer: >90 mL/min (ref >90)
Glucose, Bld: 70 mg/dL (ref 70–99)
Potassium: 3.7 mEq/L (ref 3.5–5.1)
Total Bilirubin: 0.3 mg/dL (ref 0.3–1.2)

## 2011-08-30 LAB — CBC
HCT: 39.5 % (ref 36.0–46.0)
Hemoglobin: 13.3 g/dL (ref 12.0–15.0)
RBC: 4.64 MIL/uL (ref 3.87–5.11)
WBC: 4.9 10*3/uL (ref 4.0–10.5)

## 2011-08-30 LAB — PROTIME-INR
INR: 0.92 (ref 0.00–1.49)
Prothrombin Time: 12.6 seconds (ref 11.6–15.2)

## 2011-08-30 NOTE — Progress Notes (Signed)
H&P Dictated 08/30/11 Dictation # [id number]

## 2011-08-30 NOTE — Patient Instructions (Signed)
20 [name]  08/30/2011   Your procedure is scheduled on:  09-04-11   Report to [facility] at 0730  AM.  Call this number if you have problems the morning of surgery: [phone]   Remember:   Do not eat food or drink liquids:After Midnight.  .  Take these medicines the morning of surgery with A SIP OF WATER: no meds to take   Do not wear jewelry or make up.  Do not wear lotions, powders, or perfumes.Do not wear deodorant.    Do not bring valuables to the hospital.  Contacts, dentures or bridgework may not be worn into surgery.  Leave suitcase in the car. After surgery it may be brought to your room.  For patients admitted to the hospital, checkout time is 11:00 AM the day of discharge.     Special Instructions: CHG Shower Use Special Wash: 1/2 bottle night before surgery and 1/2 bottle morning of surgery.neck down avoid private area, do not shave for 2 days before showers   Please read over the following fact sheets that you were given: MRSA Information  [facility] pre op nurse phone number [phone], call if needed

## 2011-08-30 NOTE — Pre-Procedure Instructions (Addendum)
Spoke with Florentina Addison dr golden office no interpretation note available for 05-12-2011 ekg ekg 09-23-2010 in epic  chest 2 view xray 09-23-2010 in epic Medical clearance note dr Renette Butters on chart

## 2011-08-31 NOTE — H&P (Signed)
[name], [name]                 ACCOUNT NO.:  [id number]  MEDICAL RECORD NO.:  [id number]  LOCATION:                               FACILITY:  [facility]  PHYSICIAN:  Madlyn Frankel. Charlann Boxer, M.D.  DATE OF BIRTH:  May 26, 1951  DATE OF ADMISSION: DATE OF DISCHARGE:                             HISTORY & PHYSICAL   DATE OF SURGERY:  September 04, 2011.  ADMITTING DIAGNOSIS:  End-stage osteoarthritis, right knee.  HISTORY OF PRESENT ILLNESS:  This is a 61 year old lady with a history of previous bilateral total hip arthroplasty with revision of right total hip and left total knee arthroplasty, who is having pain and end- stage osteoarthritis of her right knee who is now scheduled for total knee arthroplasty of the right knee due to failure conservative treatment.  The surgery, risks, benefits, and aftercare were discussed in detail with the patient, questions invited and answered.  Note that the patient plans to go home after surgery.  She is a candidate for tranexamic acid and dexamethasone and will receive those in preop.  Her medical doctor is Dr. Assunta Found, and again she plans to go home after surgery.  DRUG ALLERGIES:  None.  CURRENT MEDICATION: 1. Lisinopril 20/12.5 mg 1 daily. 2. Diazepam 5 mg daily p.r.n. 3. Zolpidem 10 mg q.h.s. 4. Ibuprofen p.r.n.  SERIOUS MEDICAL ILLNESSES:  Hypertension and insomnia.  Also osteoarthritis with a questionable history rheumatoid arthritis, so no diagnosis of rheumatoid arthritis, by her report.  PREVIOUS SURGERIES:  Hysterectomy, bunionectomy, bilateral total hip arthroplasty with revision of right total hip, left total knee arthroplasty.  FAMILY HISTORY:  Positive for pneumonia, hypertension, diabetes, heart disease, thyroid disease.  SOCIAL HISTORY:  The patient is divorced.  She lives at home.  She is a nonsmoker and nondrinker.  Plans on going home after surgery.  REVIEW OF SYSTEMS:  CENTRAL NERVOUS SYSTEM:  Negative for  headache, blurred vision, or dizziness.  PULMONARY:  Negative for shortness of breath, PND, or orthopnea.  CARDIOVASCULAR:  Negative for chest pain or palpitation.  GASTROINTESTINAL:  Negative for ulcers, hepatitis. GENITOURINARY:  Negative for urinary tract difficulty.  MUSCULOSKELETAL: Positive in HPI.  PHYSICAL EXAMINATION:  GENERAL:  A well-developed, well-nourished lady, in no acute distress. VITAL SIGNS:  Blood pressure 139/78, respirations 14, pulse 74 and regular.  HEENT:  Head, normocephalic.  Nose, patent.  Ears, patent. Pupils equal, round, and reactive to light.  Throat without injection. NECK:  Supple without adenopathy.  Carotids are 2+ without bruit. CHEST:  Clear to auscultation.  No rales or rhonchi.  Respirations 14. HEART:  Regular rate and rhythm at 74 beats per minute without murmur. ABDOMEN:  Soft with active bowel sounds.  No masses or organomegaly. NEUROLOGIC:  The patient is alert and oriented to time, place, and person.  Cranial nerves II through XII grossly intact. EXTREMITIES:  The right knee with a 5-degree flexion contracture, further flexion to 115 degrees, crepitation throughout the range of motion.  Dorsalis pedis, posterior tibialis pulses are 2+.  ASSESSMENT:  End-stage osteoarthritis, right knee.  PLAN:  Total knee arthroplasty, right knee.     [name]. [name], P.A.   ______________________________ Madlyn Frankel  Charlann Boxer, M.D.    SJC/MEDQ  D:  08/30/2011  T:  08/31/2011  Job:  [id number]

## 2011-09-04 ENCOUNTER — Encounter (HOSPITAL_COMMUNITY): Payer: Self-pay | Admitting: *Deleted

## 2011-09-04 ENCOUNTER — Encounter (HOSPITAL_COMMUNITY)
Admission: RE | Disposition: A | Discharge status: Home Health | Payer: Self-pay | Source: Ambulatory Visit | Attending: Orthopedic Surgery

## 2011-09-04 ENCOUNTER — Inpatient Hospital Stay (HOSPITAL_COMMUNITY): Payer: Managed Care, Other (non HMO) | Admitting: Anesthesiology

## 2011-09-04 ENCOUNTER — Inpatient Hospital Stay (HOSPITAL_COMMUNITY)
Admission: RE | Admit: 2011-09-04 | Discharge: 2011-09-06 | DRG: 470 | Disposition: A | Discharge status: Home Health | Payer: Managed Care, Other (non HMO) | Source: Ambulatory Visit | Attending: Orthopedic Surgery | Admitting: Orthopedic Surgery

## 2011-09-04 ENCOUNTER — Encounter (HOSPITAL_COMMUNITY): Payer: Self-pay | Admitting: Anesthesiology

## 2011-09-04 DIAGNOSIS — G4700 Insomnia, unspecified: Secondary | ICD-10-CM | POA: Diagnosis present

## 2011-09-04 DIAGNOSIS — M1710 Unilateral primary osteoarthritis, unspecified knee: Principal | ICD-10-CM | POA: Diagnosis present

## 2011-09-04 DIAGNOSIS — Z96649 Presence of unspecified artificial hip joint: Secondary | ICD-10-CM

## 2011-09-04 DIAGNOSIS — Z01812 Encounter for preprocedural laboratory examination: Secondary | ICD-10-CM

## 2011-09-04 DIAGNOSIS — I10 Essential (primary) hypertension: Secondary | ICD-10-CM | POA: Diagnosis present

## 2011-09-04 DIAGNOSIS — Z96659 Presence of unspecified artificial knee joint: Secondary | ICD-10-CM

## 2011-09-04 HISTORY — PX: TOTAL KNEE ARTHROPLASTY: SHX125

## 2011-09-04 LAB — GLUCOSE, CAPILLARY
Glucose-Capillary: 134 mg/dL — ABNORMAL HIGH (ref 70–99)
Glucose-Capillary: 129 mg/dL — ABNORMAL HIGH (ref 70–99)
Glucose-Capillary: 105 mg/dL — ABNORMAL HIGH (ref 70–99)

## 2011-09-04 SURGERY — ARTHROPLASTY, KNEE, TOTAL
Anesthesia: Spinal | Site: Knee | Laterality: Right | Wound class: Clean

## 2011-09-04 MED ORDER — DIAZEPAM 5 MG PO TABS: 5.0000 mg | ORAL_TABLET | Freq: Four times a day (QID) | ORAL | Status: DC | PRN

## 2011-09-04 MED ORDER — METOCLOPRAMIDE HCL 10 MG PO TABS: 5.0000 mg | ORAL_TABLET | Freq: Three times a day (TID) | ORAL | Status: DC | PRN

## 2011-09-04 MED ORDER — ZOLPIDEM TARTRATE 5 MG PO TABS: 5.0000 mg | ORAL_TABLET | Freq: Every evening | ORAL | Status: DC | PRN

## 2011-09-04 MED ORDER — DEXAMETHASONE SODIUM PHOSPHATE 10 MG/ML IJ SOLN
10.0000 mg | Freq: Once | INTRAMUSCULAR | Status: DC
  Filled 2011-09-04: qty 1

## 2011-09-04 MED ORDER — ONDANSETRON HCL 4 MG/2ML IJ SOLN
4.0000 mg | Freq: Four times a day (QID) | INTRAMUSCULAR | Status: DC | PRN
  Administered 2011-09-04: 4 mg via INTRAVENOUS
  Filled 2011-09-04: qty 2

## 2011-09-04 MED ORDER — HYDROCODONE-ACETAMINOPHEN 7.5-325 MG PO TABS
1.0000 | ORAL_TABLET | ORAL | Status: DC
  Administered 2011-09-04: 2 via ORAL
  Administered 2011-09-04: 1 via ORAL
  Administered 2011-09-05 (×2): 2 via ORAL
  Administered 2011-09-05: 1 via ORAL
  Administered 2011-09-05 – 2011-09-06 (×6): 2 via ORAL
  Filled 2011-09-04 (×5): qty 2
  Filled 2011-09-04 (×2): qty 1
  Filled 2011-09-04 (×4): qty 2

## 2011-09-04 MED ORDER — PHENYLEPHRINE HCL 10 MG/ML IJ SOLN
INTRAMUSCULAR | Status: DC | PRN
  Administered 2011-09-04 (×4): 80 ug via INTRAVENOUS

## 2011-09-04 MED ORDER — FENTANYL CITRATE 0.05 MG/ML IJ SOLN
25.0000 ug | INTRAMUSCULAR | Status: DC | PRN
  Administered 2011-09-04: 50 ug via INTRAVENOUS
  Administered 2011-09-04: 25 ug via INTRAVENOUS
  Administered 2011-09-04: 50 ug via INTRAVENOUS
  Administered 2011-09-04: 25 ug via INTRAVENOUS

## 2011-09-04 MED ORDER — FERROUS SULFATE 325 (65 FE) MG PO TABS
325.0000 mg | ORAL_TABLET | Freq: Three times a day (TID) | ORAL | Status: DC
  Administered 2011-09-04 – 2011-09-06 (×6): 325 mg via ORAL
  Filled 2011-09-04 (×7): qty 1

## 2011-09-04 MED ORDER — CEFAZOLIN SODIUM-DEXTROSE 2-3 GM-% IV SOLR: 2.0000 g | Freq: Once | INTRAVENOUS | Status: DC

## 2011-09-04 MED ORDER — LIDOCAINE HCL (CARDIAC) 20 MG/ML IV SOLN
INTRAVENOUS | Status: DC | PRN
  Administered 2011-09-04: 100 mg via INTRAVENOUS

## 2011-09-04 MED ORDER — RIVAROXABAN 10 MG PO TABS
10.0000 mg | ORAL_TABLET | ORAL | Status: DC
  Administered 2011-09-05 – 2011-09-06 (×2): 10 mg via ORAL
  Filled 2011-09-04 (×3): qty 1

## 2011-09-04 MED ORDER — ACETAMINOPHEN 10 MG/ML IV SOLN
INTRAVENOUS | Status: DC | PRN
  Administered 2011-09-04: 1000 mg via INTRAVENOUS

## 2011-09-04 MED ORDER — CLOTRIMAZOLE 1 % EX CREA: 1.0000 "application " | TOPICAL_CREAM | CUTANEOUS | Status: DC | PRN

## 2011-09-04 MED ORDER — LISINOPRIL 20 MG PO TABS
20.0000 mg | ORAL_TABLET | Freq: Every day | ORAL | Status: DC
  Administered 2011-09-06: 20 mg via ORAL
  Filled 2011-09-04 (×2): qty 1

## 2011-09-04 MED ORDER — LISINOPRIL-HYDROCHLOROTHIAZIDE 20-12.5 MG PO TABS: 1.0000 | ORAL_TABLET | Freq: Every day | ORAL | Status: DC

## 2011-09-04 MED ORDER — SUCCINYLCHOLINE CHLORIDE 20 MG/ML IJ SOLN
INTRAMUSCULAR | Status: DC | PRN
  Administered 2011-09-04: 100 mg via INTRAVENOUS

## 2011-09-04 MED ORDER — METOCLOPRAMIDE HCL 5 MG/ML IJ SOLN: 5.0000 mg | Freq: Three times a day (TID) | INTRAMUSCULAR | Status: DC | PRN

## 2011-09-04 MED ORDER — HETASTARCH-ELECTROLYTES 6 % IV SOLN
INTRAVENOUS | Status: DC | PRN
  Administered 2011-09-04: 12:00:00 via INTRAVENOUS

## 2011-09-04 MED ORDER — SODIUM CHLORIDE 0.9 % IR SOLN
Status: DC | PRN
  Administered 2011-09-04: 3000 mL

## 2011-09-04 MED ORDER — LACTATED RINGERS IV SOLN
INTRAVENOUS | Status: DC | PRN
  Administered 2011-09-04 (×3): via INTRAVENOUS

## 2011-09-04 MED ORDER — BISACODYL 5 MG PO TBEC: 5.0000 mg | DELAYED_RELEASE_TABLET | Freq: Every day | ORAL | Status: DC | PRN

## 2011-09-04 MED ORDER — SUFENTANIL CITRATE 50 MCG/ML IV SOLN
INTRAVENOUS | Status: DC | PRN
  Administered 2011-09-04 (×4): 10 ug via INTRAVENOUS

## 2011-09-04 MED ORDER — LACTATED RINGERS IV SOLN
INTRAVENOUS | Status: DC
  Administered 2011-09-04: 1000 mL via INTRAVENOUS

## 2011-09-04 MED ORDER — ACETAMINOPHEN 650 MG RE SUPP: 650.0000 mg | Freq: Four times a day (QID) | RECTAL | Status: DC | PRN

## 2011-09-04 MED ORDER — ALUM & MAG HYDROXIDE-SIMETH 200-200-20 MG/5ML PO SUSP: 30.0000 mL | ORAL | Status: DC | PRN

## 2011-09-04 MED ORDER — KETOROLAC TROMETHAMINE 30 MG/ML IJ SOLN
INTRAMUSCULAR | Status: DC | PRN
  Administered 2011-09-04: 30 mg via INTRAVENOUS

## 2011-09-04 MED ORDER — ACETAMINOPHEN 325 MG PO TABS: 650.0000 mg | ORAL_TABLET | Freq: Four times a day (QID) | ORAL | Status: DC | PRN

## 2011-09-04 MED ORDER — PROPOFOL 10 MG/ML IV EMUL
INTRAVENOUS | Status: DC | PRN
  Administered 2011-09-04: 120 mg via INTRAVENOUS
  Administered 2011-09-04: 30 mg via INTRAVENOUS
  Administered 2011-09-04: 20 mg via INTRAVENOUS
  Administered 2011-09-04: 30 mg via INTRAVENOUS

## 2011-09-04 MED ORDER — METHOCARBAMOL 100 MG/ML IJ SOLN
500.0000 mg | Freq: Four times a day (QID) | INTRAMUSCULAR | Status: DC | PRN
  Filled 2011-09-04 (×2): qty 5

## 2011-09-04 MED ORDER — DIPHENHYDRAMINE HCL 25 MG PO CAPS: 25.0000 mg | ORAL_CAPSULE | Freq: Four times a day (QID) | ORAL | Status: DC | PRN

## 2011-09-04 MED ORDER — POLYETHYLENE GLYCOL 3350 17 G PO PACK
17.0000 g | PACK | Freq: Two times a day (BID) | ORAL | Status: DC
  Administered 2011-09-04 – 2011-09-06 (×4): 17 g via ORAL
  Filled 2011-09-04 (×6): qty 1

## 2011-09-04 MED ORDER — HYDROCHLOROTHIAZIDE 12.5 MG PO CAPS
12.5000 mg | ORAL_CAPSULE | Freq: Every day | ORAL | Status: DC
  Administered 2011-09-06: 12.5 mg via ORAL
  Filled 2011-09-04 (×2): qty 1

## 2011-09-04 MED ORDER — PHENOL 1.4 % MT LIQD: 1.0000 | OROMUCOSAL | Status: DC | PRN

## 2011-09-04 MED ORDER — HYDROMORPHONE HCL PF 1 MG/ML IJ SOLN
0.2500 mg | INTRAMUSCULAR | Status: DC | PRN
  Administered 2011-09-04 (×4): 0.5 mg via INTRAVENOUS

## 2011-09-04 MED ORDER — EPHEDRINE SULFATE 50 MG/ML IJ SOLN
INTRAMUSCULAR | Status: DC | PRN
  Administered 2011-09-04 (×3): 5 mg via INTRAVENOUS

## 2011-09-04 MED ORDER — DEXAMETHASONE SODIUM PHOSPHATE 10 MG/ML IJ SOLN
10.0000 mg | Freq: Once | INTRAMUSCULAR | Status: AC
  Administered 2011-09-05: 10 mg via INTRAVENOUS
  Filled 2011-09-04: qty 1

## 2011-09-04 MED ORDER — MIDAZOLAM HCL 5 MG/5ML IJ SOLN
INTRAMUSCULAR | Status: DC | PRN
  Administered 2011-09-04 (×3): 1 mg via INTRAVENOUS

## 2011-09-04 MED ORDER — FLEET ENEMA 7-19 GM/118ML RE ENEM: 1.0000 | ENEMA | Freq: Once | RECTAL | Status: AC | PRN

## 2011-09-04 MED ORDER — FENTANYL CITRATE 0.05 MG/ML IJ SOLN
INTRAMUSCULAR | Status: DC | PRN
  Administered 2011-09-04: 50 ug via INTRAVENOUS
  Administered 2011-09-04: 150 ug via INTRAVENOUS
  Administered 2011-09-04: 50 ug via INTRAVENOUS

## 2011-09-04 MED ORDER — ONDANSETRON HCL 4 MG PO TABS: 4.0000 mg | ORAL_TABLET | Freq: Four times a day (QID) | ORAL | Status: DC | PRN

## 2011-09-04 MED ORDER — ONDANSETRON HCL 4 MG/2ML IJ SOLN
INTRAMUSCULAR | Status: DC | PRN
  Administered 2011-09-04: 4 mg via INTRAVENOUS

## 2011-09-04 MED ORDER — PROMETHAZINE HCL 25 MG/ML IJ SOLN: 6.2500 mg | INTRAMUSCULAR | Status: DC | PRN

## 2011-09-04 MED ORDER — SODIUM CHLORIDE 0.9 % IV SOLN
INTRAVENOUS | Status: DC
  Administered 2011-09-04 – 2011-09-05 (×2): via INTRAVENOUS
  Filled 2011-09-04 (×8): qty 1000

## 2011-09-04 MED ORDER — CEFAZOLIN SODIUM 1-5 GM-% IV SOLN
INTRAVENOUS | Status: DC | PRN
  Administered 2011-09-04: 2 g via INTRAVENOUS

## 2011-09-04 MED ORDER — TRANEXAMIC ACID 100 MG/ML IV SOLN: 15.0000 mg/kg | Freq: Once | INTRAVENOUS | Status: DC

## 2011-09-04 MED ORDER — CEFAZOLIN SODIUM-DEXTROSE 2-3 GM-% IV SOLR
2.0000 g | Freq: Four times a day (QID) | INTRAVENOUS | Status: AC
  Administered 2011-09-04 – 2011-09-05 (×3): 2 g via INTRAVENOUS
  Filled 2011-09-04 (×3): qty 50

## 2011-09-04 MED ORDER — DOCUSATE SODIUM 100 MG PO CAPS
100.0000 mg | ORAL_CAPSULE | Freq: Two times a day (BID) | ORAL | Status: DC
  Administered 2011-09-04 – 2011-09-06 (×4): 100 mg via ORAL
  Filled 2011-09-04 (×5): qty 1

## 2011-09-04 MED ORDER — MENTHOL 3 MG MT LOZG: 1.0000 | LOZENGE | OROMUCOSAL | Status: DC | PRN

## 2011-09-04 MED ORDER — 0.9 % SODIUM CHLORIDE (POUR BTL) OPTIME
TOPICAL | Status: DC | PRN
  Administered 2011-09-04: 1000 mL

## 2011-09-04 MED ORDER — BUPIVACAINE-EPINEPHRINE PF 0.25-1:200000 % IJ SOLN
INTRAMUSCULAR | Status: DC | PRN
  Administered 2011-09-04: 60 mL

## 2011-09-04 MED ORDER — METHOCARBAMOL 500 MG PO TABS: 500.0000 mg | ORAL_TABLET | Freq: Four times a day (QID) | ORAL | Status: DC | PRN

## 2011-09-04 MED ORDER — HYDROMORPHONE HCL PF 1 MG/ML IJ SOLN
0.5000 mg | INTRAMUSCULAR | Status: DC | PRN
  Administered 2011-09-05: 1 mg via INTRAVENOUS
  Filled 2011-09-04: qty 1

## 2011-09-04 MED ORDER — TRANEXAMIC ACID 100 MG/ML IV SOLN
1380.0000 mg | Freq: Once | INTRAVENOUS | Status: AC
  Administered 2011-09-04: 11:00:00 via INTRAVENOUS
  Filled 2011-09-04: qty 13.8

## 2011-09-04 SURGICAL SUPPLY — 59 items
BAG ZIPLOCK 12X15 (MISCELLANEOUS) ×2 IMPLANT
BANDAGE ELASTIC 6 VELCRO ST LF (GAUZE/BANDAGES/DRESSINGS) ×2 IMPLANT
BANDAGE ESMARK 6X9 LF (GAUZE/BANDAGES/DRESSINGS) ×1 IMPLANT
BLADE SAW SGTL 13.0X1.19X90.0M (BLADE) ×2 IMPLANT
BNDG ESMARK 6X9 LF (GAUZE/BANDAGES/DRESSINGS) ×2
BONE CEMENT GENTAMICIN (Cement) ×4 IMPLANT
BOWL SMART MIX CTS (DISPOSABLE) ×2 IMPLANT
CEMENT BONE GENTAMICIN 40 (Cement) ×2 IMPLANT
CHLORAPREP W/TINT 26ML (MISCELLANEOUS) ×2 IMPLANT
CLOTH BEACON ORANGE TIMEOUT ST (SAFETY) ×2 IMPLANT
CUFF TOURN SGL QUICK 34 (TOURNIQUET CUFF) ×1
CUFF TRNQT CYL 34X4X40X1 (TOURNIQUET CUFF) ×1 IMPLANT
DECANTER SPIKE VIAL GLASS SM (MISCELLANEOUS) ×2 IMPLANT
DERMABOND ADVANCED (GAUZE/BANDAGES/DRESSINGS) ×1
DERMABOND ADVANCED .7 DNX12 (GAUZE/BANDAGES/DRESSINGS) ×1 IMPLANT
DRAPE EXTREMITY T 121X128X90 (DRAPE) ×2 IMPLANT
DRAPE INCISE 23X17 IOBAN STRL (DRAPES) ×1
DRAPE INCISE IOBAN 23X17 STRL (DRAPES) ×1 IMPLANT
DRAPE POUCH INSTRU U-SHP 10X18 (DRAPES) ×2 IMPLANT
DRAPE U-SHAPE 47X51 STRL (DRAPES) ×2 IMPLANT
DRSG AQUACEL AG ADV 3.5X10 (GAUZE/BANDAGES/DRESSINGS) ×2 IMPLANT
DRSG TEGADERM 4X4.75 (GAUZE/BANDAGES/DRESSINGS) ×2 IMPLANT
DURAPREP 26ML APPLICATOR (WOUND CARE) IMPLANT
ELECT REM PT RETURN 9FT ADLT (ELECTROSURGICAL) ×2
ELECTRODE REM PT RTRN 9FT ADLT (ELECTROSURGICAL) ×1 IMPLANT
EVACUATOR 1/8 PVC DRAIN (DRAIN) ×2 IMPLANT
FACESHIELD LNG OPTICON STERILE (SAFETY) ×10 IMPLANT
GAUZE SPONGE 2X2 8PLY STRL LF (GAUZE/BANDAGES/DRESSINGS) ×1 IMPLANT
GLOVE BIOGEL PI IND STRL 7.5 (GLOVE) ×1 IMPLANT
GLOVE BIOGEL PI IND STRL 8 (GLOVE) ×1 IMPLANT
GLOVE BIOGEL PI INDICATOR 7.5 (GLOVE) ×1
GLOVE BIOGEL PI INDICATOR 8 (GLOVE) ×1
GLOVE ECLIPSE 8.0 STRL XLNG CF (GLOVE) ×2 IMPLANT
GLOVE ORTHO TXT STRL SZ7.5 (GLOVE) ×4 IMPLANT
GOWN BRE IMP PREV XXLGXLNG (GOWN DISPOSABLE) ×2 IMPLANT
GOWN STRL NON-REIN LRG LVL3 (GOWN DISPOSABLE) ×2 IMPLANT
HANDPIECE INTERPULSE COAX TIP (DISPOSABLE) ×1
IMMOBILIZER KNEE 20 (SOFTGOODS) ×2
IMMOBILIZER KNEE 20 THIGH 36 (SOFTGOODS) ×1 IMPLANT
KIT BASIN OR (CUSTOM PROCEDURE TRAY) ×2 IMPLANT
MANIFOLD NEPTUNE II (INSTRUMENTS) ×2 IMPLANT
NDL SAFETY ECLIPSE 18X1.5 (NEEDLE) ×1 IMPLANT
NEEDLE HYPO 18GX1.5 SHARP (NEEDLE) ×1
NS IRRIG 1000ML POUR BTL (IV SOLUTION) ×4 IMPLANT
PACK TOTAL JOINT (CUSTOM PROCEDURE TRAY) ×2 IMPLANT
POSITIONER SURGICAL ARM (MISCELLANEOUS) ×2 IMPLANT
SET HNDPC FAN SPRY TIP SCT (DISPOSABLE) ×1 IMPLANT
SET PAD KNEE POSITIONER (MISCELLANEOUS) ×2 IMPLANT
SPONGE GAUZE 2X2 STER 10/PKG (GAUZE/BANDAGES/DRESSINGS) ×1
SUCTION FRAZIER 12FR DISP (SUCTIONS) ×2 IMPLANT
SUT MNCRL AB 4-0 PS2 18 (SUTURE) ×2 IMPLANT
SUT VIC AB 1 CT1 36 (SUTURE) ×6 IMPLANT
SUT VIC AB 2-0 CT1 27 (SUTURE) ×3
SUT VIC AB 2-0 CT1 TAPERPNT 27 (SUTURE) ×3 IMPLANT
SYR 50ML LL SCALE MARK (SYRINGE) ×2 IMPLANT
TOWEL OR 17X26 10 PK STRL BLUE (TOWEL DISPOSABLE) ×4 IMPLANT
TRAY FOLEY CATH 14FRSI W/METER (CATHETERS) ×2 IMPLANT
WATER STERILE IRR 1500ML POUR (IV SOLUTION) ×2 IMPLANT
WRAP KNEE MAXI GEL POST OP (GAUZE/BANDAGES/DRESSINGS) ×2 IMPLANT

## 2011-09-04 NOTE — H&P (View-Only) (Signed)
H&P Dictated 08/30/11 Dictation # [id number]  

## 2011-09-04 NOTE — Anesthesia Preprocedure Evaluation (Signed)
Anesthesia Evaluation  Patient identified by MRN, date of birth, ID band Patient awake    Reviewed: Allergy & Precautions, H&P , NPO status , Patient's Chart, lab work & pertinent test results  Airway Mallampati: III TM Distance: >3 FB     Dental  (+) Teeth Intact and Dental Advisory Given   Pulmonary neg pulmonary ROS,  clear to auscultation  Pulmonary exam normal       Cardiovascular hypertension, Pt. on medications Regular Normal    Neuro/Psych Negative Neurological ROS  Negative Psych ROS   GI/Hepatic negative GI ROS, Neg liver ROS,   Endo/Other  Negative Endocrine ROSDiabetes mellitus-  Renal/GU negative Renal ROS  Genitourinary negative   Musculoskeletal  (+) Arthritis -,   Abdominal   Peds negative pediatric ROS (+)  Hematology negative hematology ROS (+)   Anesthesia Other Findings   Reproductive/Obstetrics negative OB ROS                           Anesthesia Physical Anesthesia Plan  ASA: II  Anesthesia Plan: Spinal   Post-op Pain Management:    Induction:   Airway Management Planned: Simple Face Mask  Additional Equipment:   Intra-op Plan:   Post-operative Plan:   Informed Consent: I have reviewed the patients History and Physical, chart, labs and discussed the procedure including the risks, benefits and alternatives for the proposed anesthesia with the patient or authorized representative who has indicated his/her understanding and acceptance.   Dental advisory given  Plan Discussed with: CRNA  Anesthesia Plan Comments:         Anesthesia Quick Evaluation

## 2011-09-04 NOTE — Transfer of Care (Signed)
Immediate Anesthesia Transfer of Care Note  Patient: [name]  Procedure(s) Performed: Procedure(s) (LRB): TOTAL KNEE ARTHROPLASTY (Right)  Patient Location: PACU  Anesthesia Type: General  Level of Consciousness: awake, alert , oriented and patient cooperative  Airway & Oxygen Therapy: Patient Spontanous Breathing and Patient connected to face mask oxygen  Post-op Assessment: Report given to PACU RN and Post -op Vital signs reviewed and stable  Post vital signs: stable  Complications: No apparent anesthesia complications

## 2011-09-04 NOTE — Interval H&P Note (Signed)
History and Physical Interval Note:  09/04/2011 8:46 AM  [name]  has presented today for surgery, with the diagnosis of right knee osteoarthritis  The various methods of treatment have been discussed with the patient and family. After consideration of risks, benefits and other options for treatment, the patient has consented to  Procedure(s) (LRB): RIGHT TOTAL KNEE ARTHROPLASTY (Right) as a surgical intervention .  The patients' history has been reviewed, patient examined, no change in status, stable for surgery.  I have reviewed the patients' chart and labs.  Questions were answered to the patient's satisfaction.     Shelda Pal

## 2011-09-04 NOTE — Op Note (Signed)
NAME:  [name]                      MEDICAL RECORD NO.:  [id number]                             FACILITY:  [facility]      PHYSICIAN:  Madlyn Frankel. Charlann Boxer, M.D.  DATE OF BIRTH:  06-Jan-1951      DATE OF PROCEDURE:  09/04/2011                                     OPERATIVE REPORT         PREOPERATIVE DIAGNOSIS:  Right knee osteoarthritis.      POSTOPERATIVE DIAGNOSIS:  Right knee osteoarthritis.      FINDINGS:  The patient was noted to have complete loss of cartilage and   bone-on-bone arthritis with associated osteophytes in the lateral and pateloofemoral compartments of   the knee with a significant synovitis and associated effusion.      PROCEDURE:  Right total knee replacement.      COMPONENTS USED:  DePuy rotating platform posterior stabilized knee   system, a size 3 femur, 3 tibia, 10 mm insert, and 38 patellar   button.      SURGEON:  Madlyn Frankel. Charlann Boxer, M.D.      ASSISTANT:  Lanney Gins, PA-C.      ANESTHESIA:  General.      SPECIMENS:  None.      COMPLICATION:  None.      DRAINS:  One Hemovac.  EBL: <100cc      TOURNIQUET TIME:   Total Tourniquet Time Documented: Thigh (Right) - 40 minutes .      The patient was stable to the recovery room.      INDICATION FOR PROCEDURE:  SAMANTA GAL is a 61 y.o. female patient of   mine.  The patient had been seen, evaluated, and treated conservatively in the   office with medication, activity modification, and injections.  The patient had   radiographic changes of bone-on-bone arthritis with endplate sclerosis and osteophytes noted.      The patient failed conservative measures including medication, injections, and activity modification, and at this point was ready for more definitive measures.   Based on the radiographic changes and failed conservative measures, the patient   decided to proceed with total knee replacement.  Risks of infection,   DVT, component failure, need for revision surgery, postop course, and   expectations were all   discussed and reviewed.  Consent was obtained for benefit of pain   relief.      PROCEDURE IN DETAIL:  The patient was brought to the operative theater.   Once adequate anesthesia, preoperative antibiotics, 2 gm of Ancef administered, the patient was positioned supine with the right thigh tourniquet placed.  The  right lower extremity was prepped and draped in sterile fashion.  A time-   out was performed identifying the patient, planned procedure, and   extremity.      The right lower extremity was placed in the [facility] leg holder.  The leg was   exsanguinated, tourniquet elevated to 250 mmHg.  A midline incision was   made followed by median parapatellar arthrotomy.  Following initial   exposure, attention was first directed to the patella.  Precut  measurement was noted to be 16 mm with severe loss of bone  and a very large osteophyte laterally.  I resected down to 12 mm and used a   38 patellar button to restore patellar height as well as cover the cut   surface.      The lug holes were drilled and a metal shim was placed to protect the   patella from retractors and saw blades.      At this point, attention was now directed to the femur.  The femoral   canal was opened with a drill, irrigated to try to prevent fat emboli.  An   intramedullary rod was passed at 5 degrees valgus, 10 mm of bone was   resected off the distal femur.  Following this resection, the tibia was   subluxated anteriorly.  Using the extramedullary guide, 4 mm of bone was resected off   the proximal lateral tibia.  We confirmed the gap would be   stable medially and laterally with a 10 mm insert as well as confirmed   the cut was perpendicular in the coronal plane, checking with an alignment rod.    3  Once this was done, I sized the femur to be a size 3 in the anterior-   posterior dimension, chose a 3 standard component based on medial and   lateral dimension.  The size 3 rotation  block was then pinned in   position anterior referenced using the C-clamp to set rotation.  The   anterior, posterior, and  chamfer cuts were made without difficulty nor   notching making certain that I was along the anterior cortex to help   with flexion gap stability.      The final box cut was made off the lateral aspect of distal femur.      At this point, the tibia was sized to be a size 3, the size 3 tray was   then pinned in position through the medial third of the tubercle,   drilled, and keel punched.  Trial reduction was now carried with a 3 femur,  3 tibia, a 10 mm insert, and the 38 patella botton.  The knee was brought to   extension, full extension with good flexion stability with the patella   tracking through the trochlea without application of pressure.  Given   all these findings, the trial components removed.  Final components were   opened and cement was mixed.  The knee was irrigated with normal saline   solution and pulse lavage.  The synovial lining was   then injected with 0.25% Marcaine with epinephrine and 1 cc of Toradol,   total of 61 cc.      The knee was irrigated.  Final implants were then cemented onto clean and   dried cut surfaces of bone with the knee brought to extension with a 10   mm trial insert.      Once the cement had fully cured, the excess cement was removed   throughout the knee.  I confirmed I was satisfied with the range of   motion and stability, and the final 10 mm insert was chosen.  It was   placed into the knee.      The tourniquet had been let down at 39 minutes.  No significant   hemostasis required.  The medium Hemovac drain was placed deep.  The   extensor mechanism was then reapproximated using #1 Vicryl with the knee  in flexion.  The   remaining wound was closed with 2-0 Vicryl and running 4-0 Monocryl.   The knee was cleaned, dried, dressed sterilely using Dermabond and   Aquacel dressing.  Drain site dressed separately.   The patient was then   brought to recovery room in stable condition, tolerating the procedure   well.   Please note that Physician Assistant, Lanney Gins, was present for the entirety of the case, and was utilized for pre-operative positioning, peri-operative retractor management, general facilitation of the procedure.  He was also utilized for primary wound closure at the end of the case.              Madlyn Frankel Charlann Boxer, M.D.

## 2011-09-04 NOTE — Anesthesia Postprocedure Evaluation (Signed)
Anesthesia Post Note  Patient: [name]  Procedure(s) Performed: Procedure(s) (LRB): TOTAL KNEE ARTHROPLASTY (Right)  Anesthesia type: General  Patient location: PACU  Post pain: Pain level controlled  Post assessment: Post-op Vital signs reviewed  Last Vitals:  Filed Vitals:   09/04/11 1345  BP: 127/76  Pulse: 56  Temp: 36.6 C  Resp: 15    Post vital signs: Reviewed  Level of consciousness: sedated  Complications: No apparent anesthesia complications

## 2011-09-05 LAB — CBC
Hemoglobin: 9.7 g/dL — ABNORMAL LOW (ref 12.0–15.0)
MCHC: 33.2 g/dL (ref 30.0–36.0)
RDW: 14.8 % (ref 11.5–15.5)
WBC: 5.1 10*3/uL (ref 4.0–10.5)

## 2011-09-05 LAB — BASIC METABOLIC PANEL
Chloride: 103 mEq/L (ref 96–112)
GFR calc Af Amer: >90 mL/min (ref >90)
GFR calc non Af Amer: >90 mL/min (ref >90)
Potassium: 3.6 mEq/L (ref 3.5–5.1)
Sodium: 138 mEq/L (ref 135–145)

## 2011-09-05 LAB — TYPE AND SCREEN

## 2011-09-05 NOTE — Progress Notes (Signed)
Subjective: 1 Day Post-Op Procedure(s) (LRB): TOTAL KNEE ARTHROPLASTY (Right)   Patient reports pain as mild. Pain well controlled. No events.  Objective:   VITALS:   Filed Vitals:   09/05/11 0710  BP: 97/65  Pulse:   Temp:   Resp:     Neurovascular intact Dorsiflexion/Plantar flexion intact Incision: dressing C/D/I No cellulitis present Compartment soft  LABS  Basename 09/05/11 0400  HGB 9.7*  HCT 29.2*  WBC 5.1  PLT 195     Basename 09/05/11 0400  NA 138  K 3.6  BUN 14  CREATININE 0.71  GLUCOSE 106*     Assessment/Plan: 1 Day Post-Op Procedure(s) (LRB): TOTAL KNEE ARTHROPLASTY (Right)   Foley cath d/c'ed HV drain d/c'ed Advance diet Up with therapy D/C IV fluids Plan for discharge tomorrow to home with HHPT, if continues to do well.   Anastasio Auerbach Kadynce Bonds   PAC  09/05/2011, 7:36 AM

## 2011-09-05 NOTE — Evaluation (Signed)
Physical Therapy Evaluation Patient Details Name: [name] MRN: [id number] DOB: 07-16-1950 Today's Date: 09/05/2011  Problem List:  Patient Active Problem List  Diagnoses  . Constipation  . Rectal bleeding  . Family history of colonic polyps  . S/P right TKA    Past Medical History:  Past Medical History  Diagnosis Date  . Arthritis, rheumatoid   . HTN (hypertension)   . Hyperlipidemia   . Vitamin d deficiency   . Osteoarthritis   . Diabetes mellitus     diet controlled  . Complication of anesthesia     itching after spinal with knee replacment, stop breathing at times after 2001 hip replacment surgery   Past Surgical History:  Past Surgical History  Procedure Date  . Colonoscopy 10/20/02    normal rectum/left-sided diverticula  . Total hip arthroplasty 2001/2008    multiple surgeries on right hip including replacements. Two replacements and four dislocations.   . Total knee arthroplasty 2008    left  . Partial hysterectomy 1989  . Toe surgery     second toe on both feet, bunionectomy  . Total hip arthroplasty 2010    left  . Colonoscopy 07/06/2011    Procedure: COLONOSCOPY;  Surgeon: Corbin Ade, MD;  [facility];  Service: Endoscopy;  Laterality: N/A;  2:00  . Abdominal hysterectomy 1989    PT Assessment/Plan/Recommendation PT Assessment Clinical Impression Statement: Pt with R TKR presents with decreased R LE strength/ROM and limited functional mobility PT Recommendation/Assessment: Patient will need skilled PT in the acute care venue PT Problem List: Decreased strength;Decreased range of motion;Decreased activity tolerance;Decreased mobility;Decreased knowledge of use of DME;Pain PT Therapy Diagnosis : Difficulty walking PT Plan PT Frequency: 7X/week PT Treatment/Interventions: DME instruction;Gait training;Stair training;Functional mobility training;Therapeutic activities;Therapeutic exercise;Patient/family education PT  Recommendation Recommendations for Other Services: OT consult Follow Up Recommendations: Home health PT Equipment Recommended: 3 in 1 bedside comode;Rolling walker with 5" wheels PT Goals  Acute Rehab PT Goals PT Goal Formulation: With patient Time For Goal Achievement: 7 days Pt will go Supine/Side to Sit: with supervision PT Goal: Supine/Side to Sit - Progress: Goal set today Pt will go Sit to Supine/Side: with supervision PT Goal: Sit to Supine/Side - Progress: Goal set today Pt will go Sit to Stand: with supervision PT Goal: Sit to Stand - Progress: Goal set today Pt will go Stand to Sit: with supervision PT Goal: Stand to Sit - Progress: Goal set today Pt will Ambulate: 51 - 150 feet;with supervision;with rolling walker PT Goal: Ambulate - Progress: Goal set today Pt will Go Up / Down Stairs: 1-2 stairs;with min assist;with least restrictive assistive device PT Goal: Up/Down Stairs - Progress: Goal set today  PT Evaluation Precautions/Restrictions  Precautions Precautions: Knee Required Braces or Orthoses: Yes Knee Immobilizer: Discontinue once straight leg raise with < 10 degree lag Restrictions Weight Bearing Restrictions: No RLE Weight Bearing: Weight bearing as tolerated Prior Functioning  Home Living Lives With: Alone Receives Help From: Family;Other (Comment) (sister will be coming to stay during day initially) Type of Home: House Home Layout: One level Home Access: Stairs to enter Entrance Stairs-Rails: None Entrance Stairs-Number of Steps: 1 Bathroom Shower/Tub: Psychologist, counselling;Other (comment) (grab bars and built in seat) Bathroom Toilet: Standard Home Adaptive Equipment: None Additional Comments: pt states she had a RW and 3in1 but got rid of them. Prior Function Level of Independence: Independent with basic ADLs;Independent with homemaking with ambulation;Independent with gait Able to Take Stairs?: Yes Driving: Yes Cognition  Cognition Arousal/Alertness:  Awake/alert Overall Cognitive Status: Appears within functional limits for tasks assessed Orientation Level: Oriented X4 Sensation/Coordination Sensation Light Touch: Appears Intact Coordination Gross Motor Movements are Fluid and Coordinated: Yes Extremity Assessment RUE Assessment RUE Assessment: Within Functional Limits LUE Assessment LUE Assessment: Within Functional Limits RLE Assessment RLE Assessment: Exceptions to [facility] (-10 - 50 AAROM at knee; 3-/5 quads) LLE Assessment LLE Assessment: Within Functional Limits Mobility (including Balance) Bed Mobility Bed Mobility: Yes Supine to Sit: 4: Min assist Transfers Transfers: Yes Sit to Stand: 4: Min assist;With upper extremity assist;From chair/3-in-1 Sit to Stand Details (indicate cue type and reason): min verbal cues for hand placement. Stand to Sit: 4: Min assist;With upper extremity assist;To chair/3-in-1 Stand to Sit Details: min verbal cues hand placement Ambulation/Gait Ambulation/Gait: Yes Ambulation/Gait Assistance: 4: Min assist Ambulation/Gait Assistance Details (indicate cue type and reason): min cues for sequence and position from RW Ambulation Distance (Feet): 100 Feet Assistive device: Rolling walker Gait Pattern: Step-to pattern    Exercise  Total Joint Exercises Ankle Circles/Pumps: AROM;Both;10 reps;Supine Quad Sets: AROM;Both;10 reps;Supine Heel Slides: AAROM;10 reps;Right;Supine Straight Leg Raises: AAROM;10 reps;Supine;Right End of Session PT - End of Session Equipment Utilized During Treatment: Right knee immobilizer Activity Tolerance: Patient tolerated treatment well Patient left: in chair;with call bell in reach Nurse Communication: Mobility status for transfers;Mobility status for ambulation General Behavior During Session: [facility] for tasks performed Cognition: [facility] for tasks performed  [name] 09/05/2011, 12:25 PM

## 2011-09-05 NOTE — Progress Notes (Signed)
Physical Therapy Treatment Patient Details Name: [name] MRN: [id number] DOB: [birth year] Today's Date: 09/05/2011  PT Assessment/Plan  PT - Assessment/Plan Comments on Treatment Session: pt very motivated and progressing well PT Plan: Discharge plan remains appropriate PT Frequency: 7X/week Recommendations for Other Services: OT consult Follow Up Recommendations: Home health PT Equipment Recommended: 3 in 1 bedside comode;Rolling walker with 5" wheels PT Goals  Acute Rehab PT Goals Time For Goal Achievement: 7 days Pt will go Supine/Side to Sit: with supervision PT Goal: Supine/Side to Sit - Progress: Goal set today Pt will go Sit to Supine/Side: with supervision PT Goal: Sit to Supine/Side - Progress: Goal set today Pt will go Sit to Stand: with supervision PT Goal: Sit to Stand - Progress: Progressing toward goal Pt will go Stand to Sit: with supervision PT Goal: Stand to Sit - Progress: Progressing toward goal Pt will Ambulate: 51 - 150 feet;with supervision;with rolling walker PT Goal: Ambulate - Progress: Progressing toward goal Pt will Go Up / Down Stairs: 1-2 stairs;with min assist;with least restrictive assistive device PT Goal: Up/Down Stairs - Progress: Goal set today  PT Treatment Precautions/Restrictions  Precautions Precautions: Knee Required Braces or Orthoses: Yes Knee Immobilizer: Discontinue once straight leg raise with < 10 degree lag Restrictions Weight Bearing Restrictions: No RLE Weight Bearing: Weight bearing as tolerated Mobility (including Balance) Bed Mobility Sit to Supine: 4: Min assist Sit to Supine - Details (indicate cue type and reason): cues for sequence and self assist technique Transfers Sit to Stand: 4: Min assist;With upper extremity assist;From chair/3-in-1 Sit to Stand Details (indicate cue type and reason): min cues for use of UEs Stand to Sit: 4: Min assist;To bed;To chair/3-in-1;With armrests;With upper extremity assist Stand  to Sit Details: cues for LE position and use of UEs Ambulation/Gait Ambulation/Gait Assistance: 4: Min assist Ambulation/Gait Assistance Details (indicate cue type and reason): min cues for position from RW Ambulation Distance (Feet): 225 Feet Assistive device: Rolling walker Gait Pattern: Step-to pattern    Exercise    End of Session PT - End of Session Equipment Utilized During Treatment: Right knee immobilizer Activity Tolerance: Patient tolerated treatment well Patient left: in bed;with call bell in reach Nurse Communication: Mobility status for transfers;Mobility status for ambulation General Behavior During Session: [facility] for tasks performed Cognition: [facility] for tasks performed  [name] 09/05/2011, 3:18 PM

## 2011-09-05 NOTE — Progress Notes (Signed)
CARE MANAGEMENT NOTE 09/05/2011  Patient:  MIASIA, CRABTREE   Account Number:  [id number]  Date Initiated:  09/05/2011  Documentation initiated by:  Colleen Can  Subjective/Objective Assessment:   dx rt knee osteoarthroitis; total knee replacemnt     Action/Plan:   CM spoke with patient . Plans are for return to her home in [place] where her daughter and sister will be caregiver,States she will need RW and 3n1. She is requesting [facility] for [facility] services. Has used them in the past   Anticipated DC Date:  09/07/2011   Anticipated DC Plan:  HOME W HOME HEALTH SERVICES  In-house referral  NA      DC Planning Services  CM consult      [facility] Choice  HOME HEALTH  DURABLE MEDICAL EQUIPMENT   Choice offered to / List presented to:  C-1 Patient   DME arranged  NA      DME agency  NA     HH arranged  HH-2 PT      [facility] agency  [facility]   Status of service:  In process, will continue to follow Medicare Important Message given?  NO (If response is "NO", the following Medicare IM given date fields will be blank) Date Medicare IM given:   Date Additional Medicare IM given:    Discharge Disposition:    Per UR Regulation:    Comments:  09/05/2011 List of hh agencies placed in shadow chart. Genevieve Norlander will provide hh needs and arrange for DME needs

## 2011-09-05 NOTE — Evaluation (Addendum)
Occupational Therapy Evaluation Patient Details Name: [name] MRN: [id number] DOB: [birth year] Today's Date: 09/05/2011  Problem List:  Patient Active Problem List  Diagnoses  . Constipation  . Rectal bleeding  . Family history of colonic polyps  . S/P right TKA    Past Medical History:  Past Medical History  Diagnosis Date  . Arthritis, rheumatoid   . HTN (hypertension)   . Hyperlipidemia   . Vitamin d deficiency   . Osteoarthritis   . Diabetes mellitus     diet controlled  . Complication of anesthesia     itching after spinal with knee replacment, stop breathing at times after 2001 hip replacment surgery   Past Surgical History:  Past Surgical History  Procedure Date  . Colonoscopy 10/20/02    normal rectum/left-sided diverticula  . Total hip arthroplasty 2001/2008    multiple surgeries on right hip including replacements. Two replacements and four dislocations.   . Total knee arthroplasty 2008    left  . Partial hysterectomy 1989  . Toe surgery     second toe on both feet, bunionectomy  . Total hip arthroplasty 2010    left  . Colonoscopy 07/06/2011    Procedure: COLONOSCOPY;  Surgeon: Corbin Ade, MD;  [facility];  Service: Endoscopy;  Laterality: N/A;  2:00  . Abdominal hysterectomy 1989    OT Assessment/Plan/Recommendation OT Assessment Clinical Impression Statement: Pt doing well and feel she will progress with functional transfers with PT while on acute. Reviewed toilet transfer with 3in1 and pt states she doesnt feel she needs to practice this further. She has assist at discharge for LB ADL and verbalizes understanding of shower transfer and states she feels she will be ok with this transfer also.  OT Recommendation/Assessment: Patient does not need any further OT services OT Recommendation Follow Up Recommendations: No OT follow up Equipment Recommended: 3 in 1 bedside comode;Rolling walker with 5" wheels OT Goals    OT  Evaluation Precautions/Restrictions  Precautions Precautions: Knee Required Braces or Orthoses: Yes Knee Immobilizer: Discontinue once straight leg raise with < 10 degree lag Restrictions Weight Bearing Restrictions: No RLE Weight Bearing: Weight bearing as tolerated Prior Functioning Home Living Lives With: Alone Receives Help From: Family;Other (Comment) (sister will be coming to stay during day initially) Bathroom Shower/Tub: Walk-in shower;Other (comment) (grab bars and built in seat) Bathroom Toilet: Standard Home Adaptive Equipment: None Additional Comments: pt states she had a RW and 3in1 but got rid of them. Prior Function Level of Independence: Independent with basic ADLs;Independent with homemaking with ambulation;Independent with gait ADL ADL Eating/Feeding: Simulated;Independent Where Assessed - Eating/Feeding: Chair Grooming: Simulated;Set up Where Assessed - Grooming: Sitting, chair Upper Body Bathing: Simulated;Chest;Right arm;Left arm;Abdomen;Set up Where Assessed - Upper Body Bathing: Unsupported;Sitting, chair Lower Body Bathing: Simulated;Minimal assistance Where Assessed - Lower Body Bathing: Sit to stand from chair Upper Body Dressing: Simulated;Set up Where Assessed - Upper Body Dressing: Sitting, chair;Unsupported Lower Body Dressing: Simulated;Moderate assistance Lower Body Dressing Details (indicate cue type and reason): pt states she thinks she still has her sock aid at home she can use. Also has a Sports administrator.  Where Assessed - Lower Body Dressing: Sit to stand from chair Toilet Transfer: Performed;Minimal assistance Toilet Transfer Details (indicate cue type and reason): with min verbal cues hand placement Toilet Transfer Method: Ambulating Toilet Transfer Equipment: Raised toilet seat with arms (or 3-in-1 over toilet) Toileting - Clothing Manipulation: Simulated;Minimal assistance Toileting - Clothing Manipulation Details (indicate cue type and reason):  min guard with gown Where Assessed - Toileting Clothing Manipulation: Standing Toileting - Hygiene: Simulated;Minimal assistance Toileting - Hygiene Details (indicate cue type and reason): min guard  Where Assessed - Toileting Hygiene: Standing Tub/Shower Transfer: Other (comment) (demonstrated technique) Equipment Used: Rolling walker ADL Comments: Pt has had multiple surgeries including knee and hips. She is familiar with ADL tasks and DME. She states her sister will be there and can assist at discharge. Demonstrated shower transfer technique and pt verbalized understanding and she didnt feel she needed to practice it. She states her sister can assist with LB ADL until able and she has the reacher and sock aid also. Pt doesnt feel she needs any further OT. Vision/Perception  Vision - History Baseline Vision: No visual deficits Cognition Cognition Arousal/Alertness: Awake/alert Overall Cognitive Status: Appears within functional limits for tasks assessed Orientation Level: Oriented X4 Sensation/Coordination Sensation Light Touch: Appears Intact Extremity Assessment RUE Assessment RUE Assessment: Within Functional Limits LUE Assessment LUE Assessment: Within Functional Limits Mobility  Transfers Transfers: Yes Sit to Stand: 4: Min assist;With upper extremity assist;From chair/3-in-1 Sit to Stand Details (indicate cue type and reason): min verbal cues for hand placement. Stand to Sit: 4: Min assist;With upper extremity assist;To chair/3-in-1 Stand to Sit Details: min verbal cues hand placement Exercises   End of Session OT - End of Session Activity Tolerance: Patient tolerated treatment well Patient left: in chair;with call bell in reach General Behavior During Session: [facility] for tasks performed Cognition: [facility] for tasks performed   [name] [phone] 09/05/2011, 11:46 AM

## 2011-09-06 LAB — BASIC METABOLIC PANEL
Calcium: 9 mg/dL (ref 8.4–10.5)
Creatinine, Ser: 0.62 mg/dL (ref 0.50–1.10)
GFR calc non Af Amer: >90 mL/min (ref >90)
Sodium: 139 mEq/L (ref 135–145)

## 2011-09-06 LAB — CBC
MCH: 28.9 pg (ref 26.0–34.0)
MCHC: 33.7 g/dL (ref 30.0–36.0)
MCV: 85.8 fL (ref 78.0–100.0)
Platelets: 192 10*3/uL (ref 150–400)
RDW: 14.7 % (ref 11.5–15.5)

## 2011-09-06 LAB — GLUCOSE, CAPILLARY: Glucose-Capillary: 99 mg/dL (ref 70–99)

## 2011-09-06 MED ORDER — METHOCARBAMOL 500 MG PO TABS: 500.0000 mg | ORAL_TABLET | Freq: Four times a day (QID) | ORAL | Status: AC | PRN

## 2011-09-06 MED ORDER — POLYETHYLENE GLYCOL 3350 17 G PO PACK: 17.0000 g | PACK | Freq: Two times a day (BID) | ORAL | Status: AC

## 2011-09-06 MED ORDER — DIPHENHYDRAMINE HCL 25 MG PO CAPS: 25.0000 mg | ORAL_CAPSULE | Freq: Four times a day (QID) | ORAL | Status: DC | PRN

## 2011-09-06 MED ORDER — FERROUS SULFATE 325 (65 FE) MG PO TABS: 325.0000 mg | ORAL_TABLET | Freq: Three times a day (TID) | ORAL | Status: DC

## 2011-09-06 MED ORDER — ASPIRIN EC 325 MG PO TBEC: 325.0000 mg | DELAYED_RELEASE_TABLET | Freq: Two times a day (BID) | ORAL | Status: AC

## 2011-09-06 MED ORDER — HYDROCODONE-ACETAMINOPHEN 7.5-325 MG PO TABS: 1.0000 | ORAL_TABLET | ORAL | Status: AC

## 2011-09-06 MED ORDER — DSS 100 MG PO CAPS: 100.0000 mg | ORAL_CAPSULE | Freq: Two times a day (BID) | ORAL | Status: AC

## 2011-09-06 NOTE — Progress Notes (Signed)
Physical Therapy Treatment Patient Details Name: MIRAGE PFEFFERKORN MRN: [id number] DOB: February 21, 1951 Today's Date: 09/06/2011  PT Assessment/Plan  PT - Assessment/Plan Comments on Treatment Session: pt very motivated and progressing well PT Plan: Discharge plan remains appropriate PT Frequency: 7X/week Recommendations for Other Services: OT consult Follow Up Recommendations: Home health PT Equipment Recommended: 3 in 1 bedside comode;Rolling walker with 5" wheels PT Goals  Acute Rehab PT Goals PT Goal Formulation: With patient Time For Goal Achievement: 7 days Pt will go Supine/Side to Sit: with supervision PT Goal: Supine/Side to Sit - Progress: Met Pt will go Sit to Supine/Side: with supervision PT Goal: Sit to Supine/Side - Progress: Met Pt will go Sit to Stand: with supervision PT Goal: Sit to Stand - Progress: Met Pt will go Stand to Sit: with supervision PT Goal: Stand to Sit - Progress: Met Pt will Ambulate: 51 - 150 feet;with supervision;with rolling walker PT Goal: Ambulate - Progress: Met Pt will Go Up / Down Stairs: 1-2 stairs;with min assist;with least restrictive assistive device PT Goal: Up/Down Stairs - Progress: Met  PT Treatment Precautions/Restrictions  Precautions Precautions: Knee Required Braces or Orthoses: Yes Knee Immobilizer: Discontinue once straight leg raise with < 10 degree lag Restrictions Weight Bearing Restrictions: No RLE Weight Bearing: Weight bearing as tolerated Mobility (including Balance) Bed Mobility Supine to Sit: 7: Independent Sit to Supine: 7: Independent Transfers Sit to Stand: 5: Supervision Stand to Sit: 5: Supervision Ambulation/Gait Ambulation/Gait Assistance: 5: Supervision Ambulation Distance (Feet): 250 Feet Assistive device: Rolling walker Gait Pattern: Step-to pattern;Step-through pattern Stairs: Yes Stairs Assistance: 4: Min assist Stairs Assistance Details (indicate cue type and reason): min cue for foot/RW  placement Stair Management Technique: No rails;With walker;Backwards;Forwards Number of Stairs: 1  (1x 3 fwd and bkwd)    Exercise  Total Joint Exercises Ankle Circles/Pumps: AROM;20 reps;Supine;Both Quad Sets: AROM;20 reps;Supine;Both Short Arc Quad: AROM;AAROM;10 reps;5 reps;Supine;Both Heel Slides: AAROM;Right;20 reps;Supine Straight Leg Raises: AAROM;20 reps;Right;Supine End of Session PT - End of Session Activity Tolerance: Patient tolerated treatment well Patient left: in chair;with call bell in reach Nurse Communication: Mobility status for transfers;Mobility status for ambulation General Behavior During Session: [facility] for tasks performed Cognition: [facility] for tasks performed  Fonnie Crookshanks 09/06/2011, 8:49 AM

## 2011-09-06 NOTE — Progress Notes (Signed)
D/c instructions given and explained to pt.  Prescription for norco given to pt, placed in pt's hands.  Iv site d/c and place pressure dressing over site.  Pt stable and ready for d/c.  Pt getting dressed, pt's sister in room to take pt home.

## 2011-09-06 NOTE — Discharge Summary (Signed)
Physician Discharge Summary  Patient ID: [name] MRN: [id number] DOB/AGE: [birth year] 61 y.o.  Admit date: 09/04/2011 Discharge date: 09/06/2011  Procedures:  Procedure(s) (LRB): TOTAL KNEE ARTHROPLASTY (Right)  Attending Physician: Shelda Pal, MD   Admission Diagnoses: End-stage osteoarthritis, right knee    Discharge Diagnoses:  Principal Problem:  *S/P right TKA HTN Insomnia  OA   HPI: This is a 61 year old lady with a history of previous bilateral total hip arthroplasty with revision of right total hip and left total knee arthroplasty, who is having pain and end- stage osteoarthritis of her right knee who is now scheduled for total knee arthroplasty of the right knee due to failure conservative treatment. The surgery, risks, benefits, and aftercare were discussed in detail with the patient, questions invited and answered. Note that the patient plans to go home after surgery. She is a candidate for tranexamic acid and dexamethasone and will receive those in preop. Her medical doctor is Dr. Assunta Found, and again she plans to go home after surgery.  PCP: Colette Ribas, MD, MD   Discharged Condition: good  Hospital Course:  Patient underwent the above stated procedure on 09/04/2011. Patient tolerated the procedure well and brought to the recovery room in good condition and subsequently to the floor.  POD #1 BP: 105/67 ; Pulse: 55 ; Temp: 98.1 F (36.7 C) ; Resp: 16  Pt's foley was removed, as well as the hemovac drain removed. IV was changed to a saline lock. Patient reports pain as mild. Pain well controlled. No events. Neurovascular intact, dorsiflexion/plantar flexion intact, incision: dressing C/D/I, no cellulitis present and compartment soft.  LABS  Basename  09/05/11 0400   HGB  9.7  HCT  29.2    POD #2  BP: 124/78 ; Pulse: 50 ; Temp: 98.2 F (36.8 C) ; Resp: 16  Patient reports pain as mild. No events. Ready for discharge home. Neurovascular intact,  dorsiflexion/plantar flexion intact, incision: dressing C/D/I, no cellulitis present and compartment soft.   LABS  Basename  09/06/11 0405   HGB  9.2  HCT  27.3    Discharge Exam: General appearance: alert, cooperative and no distress Extremities: Homans sign is negative, no sign of DVT, no edema, redness or tenderness in the calves or thighs and no ulcers, gangrene or trophic changes  Disposition: Home with HHPT  with follow up in 2 weeks  Follow-up Information    Follow up with OLIN,Seniyah Esker D in 2 weeks.   Contact information:   [facility] [address], [address] [place] [address] [phone]          Discharge Orders    Future Orders Please Complete By Expires   Diet - low sodium heart healthy      Call MD / Call 911      Comments:   If you experience chest pain or shortness of breath, CALL 911 and be transported to the hospital emergency room.  If you develope a fever above 101 F, pus (white drainage) or increased drainage or redness at the wound, or calf pain, call your surgeon's office.   Discharge instructions      Comments:   Maintain surgical dressing for 8 days, then replace with gauze and tape. Keep the area dry and clean until follow up. Follow up in 2 weeks at [facility]. Call with any questions or concerns.     Constipation Prevention      Comments:   Drink plenty of fluids.  Prune juice may be helpful.  You may use a stool softener, such as Colace (over the counter) 100 mg twice a day.  Use MiraLax (over the counter) for constipation as needed.   Increase activity slowly as tolerated      Weight Bearing as taught in Physical Therapy      Comments:   Use a walker or crutches as instructed.   Driving restrictions      Comments:   No driving for 4 weeks   TED hose      Comments:   Use stockings (TED hose) for 2 weeks on both leg(s).  You may remove them at night for sleeping.   Change dressing       Comments:   Maintain surgical dressing for 8 days, then change the dressing daily with sterile 4 x 4 inch gauze dressing and tape. Keep the area dry and clean.      Current Discharge Medication List    START taking these medications   Details  aspirin EC 325 MG tablet Take 1 tablet (325 mg total) by mouth 2 (two) times daily. X 4 weeks Qty: 60 tablet, Refills: 0    diphenhydrAMINE (BENADRYL) 25 mg capsule Take 1 capsule (25 mg total) by mouth every 6 (six) hours as needed for itching, allergies or sleep. Qty: 30 capsule    docusate sodium 100 MG CAPS Take 100 mg by mouth 2 (two) times daily. Qty: 10 capsule    ferrous sulfate 325 (65 FE) MG tablet Take 1 tablet (325 mg total) by mouth 3 (three) times daily after meals.    HYDROcodone-acetaminophen (NORCO) 7.5-325 MG per tablet Take 1-2 tablets by mouth every 4 (four) hours. Qty: 120 tablet, Refills: 0    methocarbamol (ROBAXIN) 500 MG tablet Take 1 tablet (500 mg total) by mouth every 6 (six) hours as needed (muscle spasms).      CONTINUE these medications which have CHANGED   Details  polyethylene glycol (MIRALAX / GLYCOLAX) packet Take 17 g by mouth 2 (two) times daily.      CONTINUE these medications which have NOT CHANGED   Details  clotrimazole (LOTRIMIN) 1 % cream Apply 1 application topically as needed. For rash    lisinopril-hydrochlorothiazide (PRINZIDE,ZESTORETIC) 20-12.5 MG per tablet Take 1 tablet by mouth daily before breakfast.     diazepam (VALIUM) 5 MG tablet Take 5 mg by mouth every 6 (six) hours as needed. Anxiety     fish oil-omega-3 fatty acids 1000 MG capsule Take 1 g by mouth daily.     Flaxseed, Linseed, (FLAXSEED OIL PO) Take 1 capsule by mouth daily.     Multiple Vitamin (MULITIVITAMIN WITH MINERALS) TABS Take 1 tablet by mouth daily.     Phytosterol Esters (CHOLEST CARE) 500 MG CAPS Take 1 capsule by mouth daily.     Psyllium (RA FIBER SUPPLEMENT PO) Take 1 capsule by mouth 2 (two) times  daily.     zolpidem (AMBIEN) 10 MG tablet Take 10 mg by mouth at bedtime as needed. For sleep      STOP taking these medications     acetaminophen (TYLENOL) 650 MG CR tablet Comments:  Reason for Stopping:       ibuprofen (ADVIL,MOTRIN) 800 MG tablet Comments:  Reason for Stopping:       aspirin 81 MG tablet Comments:  Reason for Stopping:       nabumetone (RELAFEN) 750 MG tablet Comments:  Reason for Stopping:  Signed: Anastasio Auerbach. Adalin Vanderploeg   PAC  09/06/2011, 8:56 AM

## 2011-09-06 NOTE — Progress Notes (Signed)
CARE MANAGEMENT NOTE 09/06/2011  Patient:  JYLL, TOMARO   Account Number:  [id number]  Date Initiated:  09/05/2011  Documentation initiated by:  Colleen Can  Subjective/Objective Assessment:   dx rt knee osteoarthroitis; total knee replacemnt     Action/Plan:   CM spoke with patient . Plans are for return to her home in [place] where her daughter and sister will be caregiver,States she will need RW and 3n1. She is requesting Genevieve Norlander for [facility] services. Has used them in the past   Anticipated DC Date:  09/07/2011   Anticipated DC Plan:  HOME W HOME HEALTH SERVICES  In-house referral  NA      DC Planning Services  CM consult      [facility] Choice  HOME HEALTH  DURABLE MEDICAL EQUIPMENT   Choice offered to / List presented to:  C-1 Patient   DME arranged  NA      DME agency  NA     HH arranged  HH-2 PT      [facility] agency  [facility]   Status of service:  Completed, signed off Medicare Important Message given?  NO (If response is "NO", the following Medicare IM given date fields will be blank) Date Medicare IM given:   Date Additional Medicare IM given:    Discharge Disposition:  HOME W HOME HEALTH SERVICES  Per UR Regulation:    Comments:  09/06/2011 Raynelle Bring BSN CCM [phone] Pt for discharge today. [facility] will start services tomorrow 09/07/11

## 2011-09-06 NOTE — Progress Notes (Signed)
Subjective: 2 Days Post-Op Procedure(s) (LRB): TOTAL KNEE ARTHROPLASTY (Right)   Patient reports pain as mild. No events. Ready for discharge home.  Objective:   VITALS:   Filed Vitals:   09/06/11 0506  BP: 124/78  Pulse: 50  Temp: 98.2 F (36.8 C)  Resp: 16    Neurovascular intact Dorsiflexion/Plantar flexion intact Incision: dressing C/D/I No cellulitis present Compartment soft  LABS  Basename 09/06/11 0405 09/05/11 0400  HGB 9.2* 9.7*  HCT 27.3* 29.2*  WBC 6.0 5.1  PLT 192 195     Basename 09/06/11 0405 09/05/11 0400  NA 139 138  K 4.2 3.6  BUN 14 14  CREATININE 0.62 0.71  GLUCOSE 113* 106*     Assessment/Plan: 2 Days Post-Op Procedure(s) (LRB): TOTAL KNEE ARTHROPLASTY (Right)   Up with therapy Discharge home with home health Follow up in 2 weeks at [facility].  Follow-up Information    Follow up with OLIN,Sheniece Ruggles D in 2 weeks.   Contact information:   [facility] [address] [place] [address] [phone]          Anastasio Auerbach. Kavish Lafitte   PAC  09/06/2011, 8:47 AM

## 2011-09-07 ENCOUNTER — Encounter (HOSPITAL_COMMUNITY): Payer: Self-pay | Admitting: Orthopedic Surgery

## 2011-12-13 ENCOUNTER — Encounter (HOSPITAL_COMMUNITY): Payer: Self-pay | Admitting: Pharmacy Technician

## 2011-12-20 ENCOUNTER — Other Ambulatory Visit: Payer: Self-pay

## 2011-12-20 ENCOUNTER — Encounter (HOSPITAL_COMMUNITY): Payer: Self-pay

## 2011-12-20 ENCOUNTER — Encounter (HOSPITAL_COMMUNITY)
Admission: RE | Admit: 2011-12-20 | Discharge: 2011-12-20 | Disposition: A | Discharge status: Home / Self Care | Payer: Managed Care, Other (non HMO) | Source: Ambulatory Visit | Attending: Orthopedic Surgery | Admitting: Orthopedic Surgery

## 2011-12-20 ENCOUNTER — Ambulatory Visit (HOSPITAL_COMMUNITY)
Admission: RE | Admit: 2011-12-20 | Discharge: 2011-12-20 | Disposition: A | Discharge status: Home / Self Care | Payer: Managed Care, Other (non HMO) | Source: Ambulatory Visit | Attending: Orthopedic Surgery | Admitting: Orthopedic Surgery

## 2011-12-20 DIAGNOSIS — Z01810 Encounter for preprocedural cardiovascular examination: Secondary | ICD-10-CM | POA: Insufficient documentation

## 2011-12-20 DIAGNOSIS — Z96649 Presence of unspecified artificial hip joint: Secondary | ICD-10-CM | POA: Insufficient documentation

## 2011-12-20 DIAGNOSIS — Z01812 Encounter for preprocedural laboratory examination: Secondary | ICD-10-CM | POA: Insufficient documentation

## 2011-12-20 DIAGNOSIS — I10 Essential (primary) hypertension: Secondary | ICD-10-CM | POA: Insufficient documentation

## 2011-12-20 DIAGNOSIS — T84099A Other mechanical complication of unspecified internal joint prosthesis, initial encounter: Secondary | ICD-10-CM | POA: Insufficient documentation

## 2011-12-20 DIAGNOSIS — Z79899 Other long term (current) drug therapy: Secondary | ICD-10-CM | POA: Insufficient documentation

## 2011-12-20 DIAGNOSIS — Y831 Surgical operation with implant of artificial internal device as the cause of abnormal reaction of the patient, or of later complication, without mention of misadventure at the time of the procedure: Secondary | ICD-10-CM | POA: Insufficient documentation

## 2011-12-20 LAB — COMPREHENSIVE METABOLIC PANEL
CO2: 29 mEq/L (ref 19–32)
Calcium: 10.5 mg/dL (ref 8.4–10.5)
Creatinine, Ser: 0.83 mg/dL (ref 0.50–1.10)
GFR calc Af Amer: 88 mL/min — ABNORMAL LOW (ref >90)
GFR calc non Af Amer: 76 mL/min — ABNORMAL LOW (ref >90)
Glucose, Bld: 91 mg/dL (ref 70–99)

## 2011-12-20 LAB — URINALYSIS, ROUTINE W REFLEX MICROSCOPIC
Glucose, UA: NEGATIVE mg/dL
Ketones, ur: NEGATIVE mg/dL
Leukocytes, UA: NEGATIVE
pH: 7.5 (ref 5.0–8.0)

## 2011-12-20 LAB — DIFFERENTIAL
Eosinophils Relative: 3 % (ref 0–5)
Lymphocytes Relative: 50 % — ABNORMAL HIGH (ref 12–46)
Lymphs Abs: 1.6 10*3/uL (ref 0.7–4.0)

## 2011-12-20 LAB — CBC
HCT: 42.7 % (ref 36.0–46.0)
MCHC: 33.5 g/dL (ref 30.0–36.0)
MCV: 82.8 fL (ref 78.0–100.0)
RDW: 14.7 % (ref 11.5–15.5)
WBC: 3.2 10*3/uL — ABNORMAL LOW (ref 4.0–10.5)

## 2011-12-20 LAB — APTT: aPTT: 32 seconds (ref 24–37)

## 2011-12-20 LAB — LIPID PANEL
HDL: 69 mg/dL (ref >39)
Triglycerides: 88 mg/dL (ref <150)

## 2011-12-20 NOTE — Progress Notes (Signed)
H&P performed 12/20/11 Dictation # [id number]

## 2011-12-20 NOTE — Pre-Procedure Instructions (Addendum)
PT BROUGHT ORDERS FROM HER MEDICAL DOCTOR TO DO CBC WITH DIFF, CMET, LIPID PANEL--ORDERS PUT IN EPIC AND LABS WERE DONE ALONG WITH PT, PTT, UA, EKG AND CXR -TODAY PREOP - AT [facility].  T/S IS TO BE DONE ON ARRIVAL FOR SURGERY. PREOP TEACHING DISCUSSED WITH PT USING TEACH BACK METHOD

## 2011-12-20 NOTE — Patient Instructions (Addendum)
YOUR SURGERY IS SCHEDULED ON:  Monday   6/17  At 12:50 pm  REPORT TO [facility] AT:  10:15 am      PHONE # FOR SHORT STAY IS [phone]   DO NOT EAT ANYTHING AFTER MIDNIGHT THE NIGHT BEFORE YOUR SURGERY.  NO FOOD, NO CHEWING GUM, NO MINTS, NO CANDIES, NO CHEWING TOBACCO.  You  May have clear liquids to drink from midnight until 6:45 am the day of your surgery--like water,gingerale, coffee ( no milk or milk products).  Nothing to drink after 6:45 am the day of your surgery.  PLEASE TAKE THE FOLLOWING MEDICATIONS THE AM OF YOUR SURGERY WITH A FEW SIPS OF WATER:  Bupropion    IF YOU USE INHALERS--USE YOUR INHALERS THE AM OF YOUR SURGERY AND BRING INHALERS TO THE HOSPITAL -TAKE TO SURGERY.    IF YOU ARE DIABETIC:  DO NOT TAKE ANY DIABETIC MEDICATIONS THE AM OF YOUR SURGERY.  IF YOU TAKE INSULIN IN THE EVENINGS--PLEASE ONLY TAKE 1/2 NORMAL EVENING DOSE THE NIGHT BEFORE YOUR SURGERY.  NO INSULIN THE AM OF YOUR SURGERY.  IF YOU HAVE SLEEP APNEA AND USE CPAP OR BIPAP--PLEASE BRING THE MASK --NOT THE MACHINE-NOT THE TUBING   -JUST THE MASK. DO NOT BRING VALUABLES, MONEY, CREDIT CARDS.  CONTACT LENS, DENTURES / PARTIALS, GLASSES SHOULD NOT BE WORN TO SURGERY AND IN MOST CASES-HEARING AIDS WILL NEED TO BE REMOVED.  BRING YOUR GLASSES CASE, ANY EQUIPMENT NEEDED FOR YOUR CONTACT LENS. FOR PATIENTS ADMITTED TO THE HOSPITAL--CHECK OUT TIME THE DAY OF DISCHARGE IS 11:00 AM.  ALL INPATIENT ROOMS ARE PRIVATE - WITH BATHROOM, TELEPHONE, TELEVISION AND WIFI INTERNET. IF YOU ARE BEING DISCHARGED THE SAME DAY OF YOUR SURGERY--YOU CAN NOT DRIVE YOURSELF HOME--AND SHOULD NOT GO HOME ALONE BY TAXI OR BUS.  NO DRIVING OR OPERATING MACHINERY FOR 24 HOURS FOLLOWING ANESTHESIA / PAIN MEDICATIONS.                            SPECIAL INSTRUCTIONS:  CHLORHEXIDINE SOAP SHOWER (other brand names are Betasept and Hibiclens ) PLEASE SHOWER WITH CHLORHEXIDINE THE NIGHT BEFORE YOUR SURGERY AND THE AM OF YOUR  SURGERY. DO NOT USE CHLORHEXIDINE ON YOUR FACE OR PRIVATE AREAS--YOU MAY USE YOUR NORMAL SOAP THOSE AREAS AND YOUR NORMAL SHAMPOO.  WOMEN SHOULD AVOID SHAVING UNDER ARMS AND SHAVING LEGS 48 HOURS BEFORE USING CHLORHEXIDINE TO AVOID SKIN IRRITATION.  DO NOT USE IF ALLERGIC TO CHLORHEXIDINE.  PLEASE READ OVER ANY  FACT SHEETS THAT YOU WERE GIVEN: MRSA INFORMATION, BLOOD TRANSFUSION INFORMATION, INCENTIVE SPIROMETER INFORMATION.

## 2011-12-21 NOTE — Pre-Procedure Instructions (Signed)
PT'S PREOP CBC, DIFF, CMET, LIPID PANEL REPORTS FAXED TO DR. Higinio Roger OFFICE--AS PER HIS ORDERS

## 2011-12-21 NOTE — H&P (Signed)
NAME:  [name], [name]                 ACCOUNT NO.:  [id number]  MEDICAL RECORD NO.:  [id number]  LOCATION:  [facility]                         FACILITY:  [facility]  PHYSICIAN:  Matthew D. Olin, M.D.  DATE OF BIRTH:  08/31/1950  DATE OF ADMISSION:  12/20/2011 DATE OF DISCHARGE:  12/20/2011                             HISTORY & PHYSICAL   DATE OF SURGERY:  December 25, 2011.  ADMITTING DIAGNOSIS:  Failed total hip arthroplasty, right hip.  HISTORY OF PRESENT ILLNESS:  This is a 61-year-old lady with a history of total hip arthroplasty with a subsequent revision, who has now loosening of that prosthesis.  At this time, the patient is scheduled for revision total hip arthroplasty with change of femoral stem and plastic bearing possible acetabular revision.  The surgery risks, benefits, and aftercare were discussed in detail with the patient, questions invited and answered.  Note that she is a candidate for trans- Cinnamic acid and dexamethasone and will receive both at surgery.  She is planning on going home after surgery.  She is given her home medications of aspirin, Robaxin, iron, MiraLax, and Colace, and her medical doctor is Dr. John Golding.  PAST MEDICAL HISTORY:  Drug allergies none.  CURRENT MEDICATIONS: 1. Lisinopril/hydrochlorothiazide 20/12.5 one daily. 2. Zolpidem 10 mg 1 nightly p.r.n. sleep. 3. Hydrocodone 5/325, 1 daily p.r.n. pain. 4. Bupropion hydrochloride 300 mg 1 daily. 5. Nabumetone 750 mg 1 b.i.d. p.r.n., which she has stopped before     surgery.  SERIOUS MEDICAL ILLNESSES:  Include hypertension, insomnia.  PREVIOUS SURGERIES:  Include hysterectomy, bunionectomy, right hip replacement in 2001, right hip revision in 2008, left knee replaced in 2008, left hip replaced in 2010, right knee replaced in 2013.  FAMILY HISTORY:  Positive for hypertension, diabetes, thyroid disease, and acid reflux.  SOCIAL HISTORY:  The patient is divorced.  She is a module operator. She does  not smoke and does not drink and again plans on going home after surgery.  REVIEW OF SYSTEMS:  CENTRAL NERVOUS SYSTEM:  Negative for headache, blurred vision, or dizziness.  Positive for insomnia.  PULMONARY: Negative for shortness of breath, PND, or orthopnea.  CARDIOVASCULAR: Negative for chest pain, palpitation.  GI:  Negative for ulcers, hepatitis.  GU:  Negative for urinary tract difficulty. MUSCULOSKELETAL:  Positive as in HPI.  PHYSICAL EXAMINATION:  GENERAL:  This is a well-developed, well- nourished lady, in no acute distress. VITAL SIGNS:  Blood pressure 128/91, pulse 76 and regular, respirations 14. HEENT:  Head normocephalic.  Nose patent.  Ears patent.  Pupils are equal, round, and reactive to light.  Throat without injection. NECK:  Supple without adenopathy.  Carotids 2+ without bruit. CHEST:  Clear to auscultation.  No rales or rhonchi.  Respirations 14. HEART:  Regular rate and rhythm at 76 beats per minute without murmur. ABDOMEN:  Soft.  Active bowel sounds.  No masses or organomegaly. NEUROLOGIC:  The patient is alert and oriented to time, place, and person.  Cranial nerves II through XII are grossly intact. EXTREMITIES:  Show well-healed total knee arthroplasty and total hip scars.  Her recent total knee arthroplasty shows full extension, further flexion to 120 degrees.    No calf tenderness.  No cords.  Negative Homans sign.  Neurovascular status intact.  Her right hip shows markedly reduced range of motion with external rotation of 25 degrees, internal rotation to -5, flexion to 90.  ASSESSMENT:  Failed total hip arthroplasty, right hip.  PLAN:  Revision right total hip arthroplasty.     Roosevelt Bisher J. Nghia Mcentee, P.A.   ______________________________ Matthew D. Olin, M.D.    SJC/MEDQ  D:  12/20/2011  T:  12/21/2011  Job:  [id number] 

## 2011-12-24 ENCOUNTER — Emergency Department (HOSPITAL_COMMUNITY)
Admission: EM | Admit: 2011-12-24 | Discharge: 2011-12-24 | Disposition: A | Discharge status: Home / Self Care | Payer: No Typology Code available for payment source | Attending: Emergency Medicine | Admitting: Emergency Medicine

## 2011-12-24 ENCOUNTER — Encounter (HOSPITAL_COMMUNITY): Payer: Self-pay | Admitting: *Deleted

## 2011-12-24 DIAGNOSIS — Z9889 Other specified postprocedural states: Secondary | ICD-10-CM | POA: Insufficient documentation

## 2011-12-24 DIAGNOSIS — E119 Type 2 diabetes mellitus without complications: Secondary | ICD-10-CM | POA: Insufficient documentation

## 2011-12-24 DIAGNOSIS — IMO0002 Reserved for concepts with insufficient information to code with codable children: Secondary | ICD-10-CM | POA: Insufficient documentation

## 2011-12-24 DIAGNOSIS — M1990 Unspecified osteoarthritis, unspecified site: Secondary | ICD-10-CM | POA: Insufficient documentation

## 2011-12-24 DIAGNOSIS — E785 Hyperlipidemia, unspecified: Secondary | ICD-10-CM | POA: Insufficient documentation

## 2011-12-24 DIAGNOSIS — Z79899 Other long term (current) drug therapy: Secondary | ICD-10-CM | POA: Insufficient documentation

## 2011-12-24 DIAGNOSIS — M069 Rheumatoid arthritis, unspecified: Secondary | ICD-10-CM | POA: Insufficient documentation

## 2011-12-24 DIAGNOSIS — T148XXA Other injury of unspecified body region, initial encounter: Secondary | ICD-10-CM

## 2011-12-24 DIAGNOSIS — I10 Essential (primary) hypertension: Secondary | ICD-10-CM | POA: Insufficient documentation

## 2011-12-24 HISTORY — DX: Obesity, unspecified: E66.9
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
## 2011-12-25 ENCOUNTER — Inpatient Hospital Stay (HOSPITAL_COMMUNITY): Payer: Managed Care, Other (non HMO)

## 2011-12-25 ENCOUNTER — Ambulatory Visit (HOSPITAL_COMMUNITY)
Admission: RE | Admit: 2011-12-25 | Payer: Managed Care, Other (non HMO) | Source: Ambulatory Visit | Admitting: Orthopedic Surgery

## 2011-12-25 ENCOUNTER — Inpatient Hospital Stay (HOSPITAL_COMMUNITY)
Admission: RE | Admit: 2011-12-25 | Discharge: 2011-12-28 | DRG: 467 | Disposition: A | Discharge status: Home Health | Payer: Managed Care, Other (non HMO) | Source: Ambulatory Visit | Attending: Orthopedic Surgery | Admitting: Orthopedic Surgery

## 2011-12-25 ENCOUNTER — Ambulatory Visit (HOSPITAL_COMMUNITY): Payer: Managed Care, Other (non HMO) | Admitting: Certified Registered Nurse Anesthetist

## 2011-12-25 ENCOUNTER — Encounter (HOSPITAL_COMMUNITY): Payer: Self-pay | Admitting: Certified Registered Nurse Anesthetist

## 2011-12-25 ENCOUNTER — Encounter (HOSPITAL_COMMUNITY): Payer: Self-pay | Admitting: *Deleted

## 2011-12-25 ENCOUNTER — Encounter (HOSPITAL_COMMUNITY)
Admission: RE | Disposition: A | Discharge status: Home Health | Payer: Self-pay | Source: Ambulatory Visit | Attending: Orthopedic Surgery

## 2011-12-25 DIAGNOSIS — Z79899 Other long term (current) drug therapy: Secondary | ICD-10-CM

## 2011-12-25 DIAGNOSIS — G4700 Insomnia, unspecified: Secondary | ICD-10-CM | POA: Diagnosis present

## 2011-12-25 DIAGNOSIS — Z96649 Presence of unspecified artificial hip joint: Secondary | ICD-10-CM

## 2011-12-25 DIAGNOSIS — D62 Acute posthemorrhagic anemia: Secondary | ICD-10-CM | POA: Diagnosis not present

## 2011-12-25 DIAGNOSIS — T84039A Mechanical loosening of unspecified internal prosthetic joint, initial encounter: Principal | ICD-10-CM | POA: Diagnosis present

## 2011-12-25 DIAGNOSIS — Y831 Surgical operation with implant of artificial internal device as the cause of abnormal reaction of the patient, or of later complication, without mention of misadventure at the time of the procedure: Secondary | ICD-10-CM | POA: Diagnosis present

## 2011-12-25 DIAGNOSIS — Z96659 Presence of unspecified artificial knee joint: Secondary | ICD-10-CM

## 2011-12-25 DIAGNOSIS — I10 Essential (primary) hypertension: Secondary | ICD-10-CM | POA: Diagnosis present

## 2011-12-25 HISTORY — PX: TOTAL HIP REVISION: SHX763

## 2011-12-25 LAB — GLUCOSE, CAPILLARY
Glucose-Capillary: 99 mg/dL (ref 70–99)
Glucose-Capillary: 134 mg/dL — ABNORMAL HIGH (ref 70–99)

## 2011-12-25 LAB — PREPARE RBC (CROSSMATCH)

## 2011-12-25 LAB — HEMOGLOBIN AND HEMATOCRIT, BLOOD
HCT: 26.8 % — ABNORMAL LOW (ref 36.0–46.0)
Hemoglobin: 8.8 g/dL — ABNORMAL LOW (ref 12.0–15.0)

## 2011-12-25 SURGERY — TOTAL HIP REVISION
Anesthesia: General | Site: Hip | Laterality: Right | Wound class: Clean

## 2011-12-25 MED ORDER — DOCUSATE SODIUM 100 MG PO CAPS
100.0000 mg | ORAL_CAPSULE | Freq: Two times a day (BID) | ORAL | Status: DC
  Administered 2011-12-25 – 2011-12-28 (×5): 100 mg via ORAL

## 2011-12-25 MED ORDER — SODIUM CHLORIDE 0.9 % IR SOLN
Status: DC | PRN
  Administered 2011-12-25: 1

## 2011-12-25 MED ORDER — ONDANSETRON HCL 4 MG/2ML IJ SOLN
INTRAMUSCULAR | Status: DC | PRN
  Administered 2011-12-25: 4 mg via INTRAVENOUS

## 2011-12-25 MED ORDER — ZOLPIDEM TARTRATE 5 MG PO TABS: 5.0000 mg | ORAL_TABLET | Freq: Every evening | ORAL | Status: DC | PRN

## 2011-12-25 MED ORDER — BISACODYL 5 MG PO TBEC: 5.0000 mg | DELAYED_RELEASE_TABLET | Freq: Every day | ORAL | Status: DC | PRN

## 2011-12-25 MED ORDER — POLYETHYLENE GLYCOL 3350 17 G PO PACK
17.0000 g | PACK | Freq: Two times a day (BID) | ORAL | Status: DC
  Administered 2011-12-25 – 2011-12-28 (×5): 17 g via ORAL

## 2011-12-25 MED ORDER — CHLORHEXIDINE GLUCONATE 4 % EX LIQD
60.0000 mL | Freq: Once | CUTANEOUS | Status: DC
  Filled 2011-12-25: qty 60

## 2011-12-25 MED ORDER — CEFAZOLIN SODIUM-DEXTROSE 2-3 GM-% IV SOLR
2.0000 g | Freq: Once | INTRAVENOUS | Status: AC
  Administered 2011-12-25: 2 g via INTRAVENOUS

## 2011-12-25 MED ORDER — ACETAMINOPHEN 10 MG/ML IV SOLN
INTRAVENOUS | Status: DC | PRN
  Administered 2011-12-25: 1000 mg via INTRAVENOUS

## 2011-12-25 MED ORDER — SUCCINYLCHOLINE CHLORIDE 20 MG/ML IJ SOLN
INTRAMUSCULAR | Status: DC | PRN
  Administered 2011-12-25: 100 mg via INTRAVENOUS

## 2011-12-25 MED ORDER — RIVAROXABAN 10 MG PO TABS
10.0000 mg | ORAL_TABLET | ORAL | Status: DC
  Administered 2011-12-26 – 2011-12-28 (×3): 10 mg via ORAL
  Filled 2011-12-25 (×4): qty 1

## 2011-12-25 MED ORDER — METHOCARBAMOL 500 MG PO TABS
500.0000 mg | ORAL_TABLET | Freq: Four times a day (QID) | ORAL | Status: DC | PRN
  Administered 2011-12-27 – 2011-12-28 (×3): 500 mg via ORAL
  Filled 2011-12-25 (×3): qty 1

## 2011-12-25 MED ORDER — METHOCARBAMOL 100 MG/ML IJ SOLN
500.0000 mg | Freq: Four times a day (QID) | INTRAVENOUS | Status: DC | PRN
  Administered 2011-12-25 – 2011-12-26 (×2): 500 mg via INTRAVENOUS
  Filled 2011-12-25 (×2): qty 5

## 2011-12-25 MED ORDER — FENTANYL CITRATE 0.05 MG/ML IJ SOLN
INTRAMUSCULAR | Status: DC | PRN
  Administered 2011-12-25 (×9): 50 ug via INTRAVENOUS

## 2011-12-25 MED ORDER — HYDROMORPHONE HCL PF 1 MG/ML IJ SOLN
0.2500 mg | INTRAMUSCULAR | Status: DC | PRN
  Administered 2011-12-25 (×2): 0.5 mg via INTRAVENOUS

## 2011-12-25 MED ORDER — CEFAZOLIN SODIUM-DEXTROSE 2-3 GM-% IV SOLR
INTRAVENOUS | Status: AC
  Filled 2011-12-25: qty 50

## 2011-12-25 MED ORDER — ONDANSETRON HCL 4 MG PO TABS: 4.0000 mg | ORAL_TABLET | Freq: Four times a day (QID) | ORAL | Status: DC | PRN

## 2011-12-25 MED ORDER — LACTATED RINGERS IV SOLN
INTRAVENOUS | Status: DC
  Administered 2011-12-25 (×3): via INTRAVENOUS
  Administered 2011-12-25: 1000 mL via INTRAVENOUS

## 2011-12-25 MED ORDER — ALUM & MAG HYDROXIDE-SIMETH 200-200-20 MG/5ML PO SUSP: 30.0000 mL | ORAL | Status: DC | PRN

## 2011-12-25 MED ORDER — MIDAZOLAM HCL 5 MG/5ML IJ SOLN
INTRAMUSCULAR | Status: DC | PRN
  Administered 2011-12-25: 2 mg via INTRAVENOUS

## 2011-12-25 MED ORDER — HETASTARCH-ELECTROLYTES 6 % IV SOLN
INTRAVENOUS | Status: DC | PRN
  Administered 2011-12-25: 15:00:00 via INTRAVENOUS

## 2011-12-25 MED ORDER — MENTHOL 3 MG MT LOZG
1.0000 | LOZENGE | OROMUCOSAL | Status: DC | PRN
  Filled 2011-12-25: qty 9

## 2011-12-25 MED ORDER — PHENOL 1.4 % MT LIQD
1.0000 | OROMUCOSAL | Status: DC | PRN
  Filled 2011-12-25: qty 177

## 2011-12-25 MED ORDER — FLEET ENEMA 7-19 GM/118ML RE ENEM: 1.0000 | ENEMA | Freq: Once | RECTAL | Status: AC | PRN

## 2011-12-25 MED ORDER — TRANEXAMIC ACID 100 MG/ML IV SOLN
1400.0000 mg | Freq: Once | INTRAVENOUS | Status: AC
  Administered 2011-12-25: 1400 mg via INTRAVENOUS
  Filled 2011-12-25: qty 14

## 2011-12-25 MED ORDER — ACETAMINOPHEN 10 MG/ML IV SOLN
INTRAVENOUS | Status: AC
  Filled 2011-12-25: qty 100

## 2011-12-25 MED ORDER — PROMETHAZINE HCL 25 MG/ML IJ SOLN: 6.2500 mg | INTRAMUSCULAR | Status: DC | PRN

## 2011-12-25 MED ORDER — OXYCODONE HCL 10 MG PO TB12
10.0000 mg | ORAL_TABLET | Freq: Two times a day (BID) | ORAL | Status: DC
  Administered 2011-12-25 – 2011-12-28 (×6): 10 mg via ORAL
  Filled 2011-12-25 (×6): qty 1

## 2011-12-25 MED ORDER — SODIUM CHLORIDE 0.9 % IV SOLN
100.0000 mL/h | INTRAVENOUS | Status: DC
  Administered 2011-12-25 – 2011-12-26 (×3): 100 mL/h via INTRAVENOUS
  Filled 2011-12-25 (×11): qty 1000

## 2011-12-25 MED ORDER — 0.9 % SODIUM CHLORIDE (POUR BTL) OPTIME
TOPICAL | Status: DC | PRN
  Administered 2011-12-25: 1000 mL

## 2011-12-25 MED ORDER — OXYCODONE HCL 5 MG PO TABS
5.0000 mg | ORAL_TABLET | ORAL | Status: DC
  Administered 2011-12-25: 5 mg via ORAL
  Administered 2011-12-26: 10 mg via ORAL
  Administered 2011-12-26 (×3): 5 mg via ORAL
  Administered 2011-12-27 – 2011-12-28 (×4): 10 mg via ORAL
  Administered 2011-12-28: 5 mg via ORAL
  Filled 2011-12-25 (×7): qty 2
  Filled 2011-12-25: qty 1
  Filled 2011-12-25 (×2): qty 2

## 2011-12-25 MED ORDER — DIPHENHYDRAMINE HCL 25 MG PO CAPS: 25.0000 mg | ORAL_CAPSULE | Freq: Four times a day (QID) | ORAL | Status: DC | PRN

## 2011-12-25 MED ORDER — HYDROMORPHONE HCL PF 1 MG/ML IJ SOLN
INTRAMUSCULAR | Status: AC
  Filled 2011-12-25: qty 1

## 2011-12-25 MED ORDER — FERROUS SULFATE 325 (65 FE) MG PO TABS
325.0000 mg | ORAL_TABLET | Freq: Three times a day (TID) | ORAL | Status: DC
  Administered 2011-12-26 – 2011-12-28 (×7): 325 mg via ORAL
  Filled 2011-12-25 (×11): qty 1

## 2011-12-25 MED ORDER — ACETAMINOPHEN 10 MG/ML IV SOLN: 1000.0000 mg | Freq: Four times a day (QID) | INTRAVENOUS | Status: DC

## 2011-12-25 MED ORDER — ROCURONIUM BROMIDE 100 MG/10ML IV SOLN
INTRAVENOUS | Status: DC | PRN
  Administered 2011-12-25: 40 mg via INTRAVENOUS

## 2011-12-25 MED ORDER — ONDANSETRON HCL 4 MG/2ML IJ SOLN: 4.0000 mg | Freq: Four times a day (QID) | INTRAMUSCULAR | Status: DC | PRN

## 2011-12-25 MED ORDER — OXYCODONE HCL 10 MG PO TB12: 10.0000 mg | ORAL_TABLET | Freq: Two times a day (BID) | ORAL | Status: DC

## 2011-12-25 MED ORDER — LIDOCAINE HCL (CARDIAC) 20 MG/ML IV SOLN
INTRAVENOUS | Status: DC | PRN
  Administered 2011-12-25: 100 mg via INTRAVENOUS

## 2011-12-25 MED ORDER — CEFAZOLIN SODIUM-DEXTROSE 2-3 GM-% IV SOLR
2.0000 g | Freq: Four times a day (QID) | INTRAVENOUS | Status: AC
  Administered 2011-12-25 – 2011-12-26 (×2): 2 g via INTRAVENOUS
  Filled 2011-12-25 (×2): qty 50

## 2011-12-25 MED ORDER — ACETAMINOPHEN 10 MG/ML IV SOLN
1000.0000 mg | Freq: Four times a day (QID) | INTRAVENOUS | Status: AC
  Administered 2011-12-25 – 2011-12-26 (×3): 1000 mg via INTRAVENOUS
  Filled 2011-12-25 (×6): qty 100

## 2011-12-25 MED ORDER — DEXAMETHASONE SODIUM PHOSPHATE 10 MG/ML IJ SOLN
10.0000 mg | Freq: Once | INTRAMUSCULAR | Status: AC
  Administered 2011-12-25: 10 mg via INTRAVENOUS

## 2011-12-25 MED ORDER — PROPOFOL 10 MG/ML IV EMUL
INTRAVENOUS | Status: DC | PRN
  Administered 2011-12-25: 130 mg via INTRAVENOUS

## 2011-12-25 SURGICAL SUPPLY — 60 items
BAG ZIPLOCK 12X15 (MISCELLANEOUS) ×4 IMPLANT
BLADE SAW SGTL 18X1.27X75 (BLADE) ×2 IMPLANT
BRUSH FEMORAL CANAL (MISCELLANEOUS) ×2 IMPLANT
CLOTH BEACON ORANGE TIMEOUT ST (SAFETY) ×2 IMPLANT
DERMABOND ADVANCED (GAUZE/BANDAGES/DRESSINGS)
DERMABOND ADVANCED .7 DNX12 (GAUZE/BANDAGES/DRESSINGS) IMPLANT
DRAPE INCISE IOBAN 85X60 (DRAPES) ×2 IMPLANT
DRAPE OEC MINIVIEW 54X84 (DRAPES) ×2 IMPLANT
DRAPE ORTHO SPLIT 77X108 STRL (DRAPES) ×2
DRAPE POUCH INSTRU U-SHP 10X18 (DRAPES) ×2 IMPLANT
DRAPE SURG 17X11 SM STRL (DRAPES) ×2 IMPLANT
DRAPE SURG ORHT 6 SPLT 77X108 (DRAPES) ×2 IMPLANT
DRAPE U-SHAPE 47X51 STRL (DRAPES) ×2 IMPLANT
DRSG ADAPTIC 3X8 NADH LF (GAUZE/BANDAGES/DRESSINGS) ×2 IMPLANT
DRSG AQUACEL AG ADV 3.5X10 (GAUZE/BANDAGES/DRESSINGS) ×4 IMPLANT
DRSG AQUACEL AG ADV 3.5X14 (GAUZE/BANDAGES/DRESSINGS) ×2 IMPLANT
DRSG EMULSION OIL 3X16 NADH (GAUZE/BANDAGES/DRESSINGS) IMPLANT
DRSG MEPILEX BORDER 4X4 (GAUZE/BANDAGES/DRESSINGS) IMPLANT
DRSG MEPILEX BORDER 4X8 (GAUZE/BANDAGES/DRESSINGS) ×2 IMPLANT
DRSG TEGADERM 4X4.75 (GAUZE/BANDAGES/DRESSINGS) ×2 IMPLANT
DURAPREP 26ML APPLICATOR (WOUND CARE) ×2 IMPLANT
ELECT BLADE TIP CTD 4 INCH (ELECTRODE) ×2 IMPLANT
ELECT REM PT RETURN 9FT ADLT (ELECTROSURGICAL) ×2
ELECTRODE REM PT RTRN 9FT ADLT (ELECTROSURGICAL) ×1 IMPLANT
EVACUATOR 1/8 PVC DRAIN (DRAIN) ×2 IMPLANT
FACESHIELD LNG OPTICON STERILE (SAFETY) ×8 IMPLANT
GAUZE SPONGE 2X2 8PLY STRL LF (GAUZE/BANDAGES/DRESSINGS) ×1 IMPLANT
GLOVE BIOGEL PI IND STRL 7.5 (GLOVE) ×1 IMPLANT
GLOVE BIOGEL PI IND STRL 8 (GLOVE) ×1 IMPLANT
GLOVE BIOGEL PI INDICATOR 7.5 (GLOVE) ×1
GLOVE BIOGEL PI INDICATOR 8 (GLOVE) ×1
GLOVE ORTHO TXT STRL SZ7.5 (GLOVE) ×4 IMPLANT
GLOVE SURG ORTHO 8.0 STRL STRW (GLOVE) ×2 IMPLANT
GOWN BRE IMP PREV XXLGXLNG (GOWN DISPOSABLE) ×4 IMPLANT
GOWN STRL NON-REIN LRG LVL3 (GOWN DISPOSABLE) ×2 IMPLANT
HANDPIECE INTERPULSE COAX TIP (DISPOSABLE) ×1
HEAD CERAMIC DELTA 36 PLUS 1.5 (Hips) ×2 IMPLANT
KIT BASIN OR (CUSTOM PROCEDURE TRAY) ×2 IMPLANT
LINER NEUTRAL 52X36X52 PLUS 4 (Liner) ×2 IMPLANT
MANIFOLD NEPTUNE II (INSTRUMENTS) ×2 IMPLANT
NS IRRIG 1000ML POUR BTL (IV SOLUTION) ×4 IMPLANT
PACK TOTAL JOINT (CUSTOM PROCEDURE TRAY) ×2 IMPLANT
POSITIONER SURGICAL ARM (MISCELLANEOUS) ×2 IMPLANT
PRESSURIZER FEMORAL UNIV (MISCELLANEOUS) IMPLANT
REAMER ROD DEEP FLUTE 2.5X950 (INSTRUMENTS) ×2 IMPLANT
SET HNDPC FAN SPRY TIP SCT (DISPOSABLE) ×1 IMPLANT
SPONGE GAUZE 2X2 STER 10/PKG (GAUZE/BANDAGES/DRESSINGS) ×1
SPONGE LAP 18X18 X RAY DECT (DISPOSABLE) ×6 IMPLANT
SPONGE LAP 4X18 X RAY DECT (DISPOSABLE) IMPLANT
STAPLER VISISTAT 35W (STAPLE) ×4 IMPLANT
SUCTION FRAZIER TIP 10 FR DISP (SUCTIONS) ×2 IMPLANT
SUT VIC AB 1 CT1 36 (SUTURE) ×8 IMPLANT
SUT VIC AB 2-0 CT1 27 (SUTURE) ×4
SUT VIC AB 2-0 CT1 TAPERPNT 27 (SUTURE) ×4 IMPLANT
SUT VLOC 180 0 24IN GS25 (SUTURE) ×4 IMPLANT
SYSTEM SOLUTION 10 INCH RT (Hips) ×2 IMPLANT
TOWEL OR 17X26 10 PK STRL BLUE (TOWEL DISPOSABLE) ×4 IMPLANT
TOWER CARTRIDGE SMART MIX (DISPOSABLE) IMPLANT
TRAY FOLEY CATH 14FRSI W/METER (CATHETERS) ×2 IMPLANT
WATER STERILE IRR 1500ML POUR (IV SOLUTION) ×2 IMPLANT

## 2011-12-25 NOTE — Transfer of Care (Signed)
Immediate Anesthesia Transfer of Care Note  Patient: [name]  Procedure(s) Performed: Procedure(s) (LRB): TOTAL HIP REVISION (Right)  Patient Location: PACU  Anesthesia Type: General  Level of Consciousness: awake, alert  and patient cooperative  Airway & Oxygen Therapy: Patient Spontanous Breathing and Patient connected to face mask oxygen  Post-op Assessment: Report given to PACU RN and Post -op Vital signs reviewed and stable  Post vital signs: Reviewed and stable  Complications: No apparent anesthesia complications

## 2011-12-25 NOTE — Anesthesia Preprocedure Evaluation (Signed)
Anesthesia Evaluation  Patient identified by MRN, date of birth, ID band Patient awake    Reviewed: Allergy & Precautions, H&P , NPO status , Patient's Chart, lab work & pertinent test results  Airway Mallampati: II TM Distance: <3 FB Neck ROM: Limited    Dental No notable dental hx.    Pulmonary neg pulmonary ROS,  breath sounds clear to auscultation  Pulmonary exam normal       Cardiovascular hypertension, Pt. on medications negative cardio ROS  Rhythm:Regular Rate:Normal     Neuro/Psych negative neurological ROS  negative psych ROS   GI/Hepatic negative GI ROS, Neg liver ROS,   Endo/Other  negative endocrine ROS  Renal/GU negative Renal ROS  negative genitourinary   Musculoskeletal negative musculoskeletal ROS (+) Arthritis -, Rheumatoid disorders,    Abdominal   Peds negative pediatric ROS (+)  Hematology negative hematology ROS (+)   Anesthesia Other Findings   Reproductive/Obstetrics negative OB ROS                           Anesthesia Physical Anesthesia Plan  ASA: II  Anesthesia Plan: General   Post-op Pain Management:    Induction: Intravenous  Airway Management Planned: Oral ETT  Additional Equipment:   Intra-op Plan:   Post-operative Plan: Extubation in OR  Informed Consent: I have reviewed the patients History and Physical, chart, labs and discussed the procedure including the risks, benefits and alternatives for the proposed anesthesia with the patient or authorized representative who has indicated his/her understanding and acceptance.   Dental advisory given  Plan Discussed with: CRNA  Anesthesia Plan Comments:         Anesthesia Quick Evaluation

## 2011-12-25 NOTE — Anesthesia Postprocedure Evaluation (Signed)
  Anesthesia Post-op Note  Patient: [name]  Procedure(s) Performed: Procedure(s) (LRB): TOTAL HIP REVISION (Right)  Patient Location: PACU  Anesthesia Type: General  Level of Consciousness: awake and alert   Airway and Oxygen Therapy: Patient Spontanous Breathing  Post-op Pain: mild  Post-op Assessment: Post-op Vital signs reviewed, Patient's Cardiovascular Status Stable, Respiratory Function Stable, Patent Airway and No signs of Nausea or vomiting  Post-op Vital Signs: stable  Complications: No apparent anesthesia complications

## 2011-12-25 NOTE — Brief Op Note (Signed)
12/25/2011  [facility]  MRN [id number] CSN: [id number]  5:38 PM  PATIENT:  [name]  61 y.o. female  PRE-OPERATIVE DIAGNOSIS:  Failed Right Total Hip Arthroplasty, loose femoral component  POST-OPERATIVE DIAGNOSIS:  Loose/Failed right total hip arthroplasty  PROCEDURE:  Procedure(s) (LRB): Revision right total hip replacement TOTAL HIP REVISION (Right)  Components: Depuy Solution 10inch bowed, 15mm, large body with 36+1.5 ceramic ball, and 36/10 degree/+4 liner  SURGEON:  Surgeon(s) and Role:    * Shelda Pal, MD - Primary  PHYSICIAN ASSISTANT: Lanney Gins, PA-C  ANESTHESIA:   general  EBL:  Total I/O In: 5200 [I.V.:4700; IV Piggyback:500] Out: 2000 [Urine:100; Blood:1900]  BLOOD ADMINISTERED:none  DRAINS: 1 medium HV drain in the right hip   LOCAL MEDICATIONS USED:  NONE  SPECIMEN:  No Specimen  DISPOSITION OF SPECIMEN:  N/A  COUNTS:  YES  TOURNIQUET:  * No tourniquets in log *  DICTATION: .Other Dictation: Dictation Number [id number]  PLAN OF CARE: Admit to inpatient   PATIENT DISPOSITION:  PACU - hemodynamically stable.   Delay start of Pharmacological VTE agent (>24hrs) due to surgical blood loss or risk of bleeding: no
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
## 2011-12-25 NOTE — H&P (View-Only) (Signed)
[name], [name]                 ACCOUNT NO.:  [id number]  MEDICAL RECORD NO.:  [id number]  LOCATION:  [facility]                         FACILITY:  [facility]  PHYSICIAN:  Madlyn Frankel. Charlann Boxer, M.D.  DATE OF BIRTH:  [birth year]  DATE OF ADMISSION:  12/20/2011 DATE OF DISCHARGE:  12/20/2011                             HISTORY & PHYSICAL   DATE OF SURGERY:  December 25, 2011.  ADMITTING DIAGNOSIS:  Failed total hip arthroplasty, right hip.  HISTORY OF PRESENT ILLNESS:  This is a 61 year old lady with a history of total hip arthroplasty with a subsequent revision, who has now loosening of that prosthesis.  At this time, the patient is scheduled for revision total hip arthroplasty with change of femoral stem and plastic bearing possible acetabular revision.  The surgery risks, benefits, and aftercare were discussed in detail with the patient, questions invited and answered.  Note that she is a candidate for trans- Cinnamic acid and dexamethasone and will receive both at surgery.  She is planning on going home after surgery.  She is given her home medications of aspirin, Robaxin, iron, MiraLax, and Colace, and her medical doctor is Dr. Assunta Found.  PAST MEDICAL HISTORY:  Drug allergies none.  CURRENT MEDICATIONS: 1. Lisinopril/hydrochlorothiazide 20/12.5 one daily. 2. Zolpidem 10 mg 1 nightly p.r.n. sleep. 3. Hydrocodone 5/325, 1 daily p.r.n. pain. 4. Bupropion hydrochloride 300 mg 1 daily. 5. Nabumetone 750 mg 1 b.i.d. p.r.n., which she has stopped before     surgery.  SERIOUS MEDICAL ILLNESSES:  Include hypertension, insomnia.  PREVIOUS SURGERIES:  Include hysterectomy, bunionectomy, right hip replacement in 2001, right hip revision in 2008, left knee replaced in 2008, left hip replaced in 2010, right knee replaced in 2013.  FAMILY HISTORY:  Positive for hypertension, diabetes, thyroid disease, and acid reflux.  SOCIAL HISTORY:  The patient is divorced.  She is a International aid/development worker. She does  not smoke and does not drink and again plans on going home after surgery.  REVIEW OF SYSTEMS:  CENTRAL NERVOUS SYSTEM:  Negative for headache, blurred vision, or dizziness.  Positive for insomnia.  PULMONARY: Negative for shortness of breath, PND, or orthopnea.  CARDIOVASCULAR: Negative for chest pain, palpitation.  GI:  Negative for ulcers, hepatitis.  GU:  Negative for urinary tract difficulty. MUSCULOSKELETAL:  Positive as in HPI.  PHYSICAL EXAMINATION:  GENERAL:  This is a well-developed, well- nourished lady, in no acute distress. VITAL SIGNS:  Blood pressure 128/91, pulse 76 and regular, respirations 14. HEENT:  Head normocephalic.  Nose patent.  Ears patent.  Pupils are equal, round, and reactive to light.  Throat without injection. NECK:  Supple without adenopathy.  Carotids 2+ without bruit. CHEST:  Clear to auscultation.  No rales or rhonchi.  Respirations 14. HEART:  Regular rate and rhythm at 76 beats per minute without murmur. ABDOMEN:  Soft.  Active bowel sounds.  No masses or organomegaly. NEUROLOGIC:  The patient is alert and oriented to time, place, and person.  Cranial nerves II through XII are grossly intact. EXTREMITIES:  Show well-healed total knee arthroplasty and total hip scars.  Her recent total knee arthroplasty shows full extension, further flexion to 120 degrees.  No calf tenderness.  No cords.  Negative Homans sign.  Neurovascular status intact.  Her right hip shows markedly reduced range of motion with external rotation of 25 degrees, internal rotation to -5, flexion to 90.  ASSESSMENT:  Failed total hip arthroplasty, right hip.  PLAN:  Revision right total hip arthroplasty.     [name]. Alaric Gladwin, P.A.   ______________________________ Madlyn Frankel Charlann Boxer, M.D.    SJC/MEDQ  D:  12/20/2011  T:  12/21/2011  Job:  [id number]

## 2011-12-25 NOTE — Interval H&P Note (Signed)
History and Physical Interval Note:  12/25/2011 11:55 AM  [name]  has presented today for surgery, with the diagnosis of Failed Right Total Hip Arthroplasty  The various methods of treatment have been discussed with the patient and family. After consideration of risks, benefits and other options for treatment, the patient has consented to  Procedure(s) (LRB): REVISION RIGHT TOTAL HIP REPLACEMENT TOTAL HIP REVISION (Right) as a surgical intervention .  The patients' history has been reviewed, patient examined, no change in status, stable for surgery.  I have reviewed the patients' chart and labs.  Questions were answered to the patient's satisfaction.     Shelda Pal

## 2011-12-26 LAB — BASIC METABOLIC PANEL
BUN: 8 mg/dL (ref 6–23)
CO2: 26 mEq/L (ref 19–32)
Chloride: 103 mEq/L (ref 96–112)
Creatinine, Ser: 0.62 mg/dL (ref 0.50–1.10)
Glucose, Bld: 128 mg/dL — ABNORMAL HIGH (ref 70–99)

## 2011-12-26 LAB — CBC
HCT: 23.2 % — ABNORMAL LOW (ref 36.0–46.0)
MCV: 82.6 fL (ref 78.0–100.0)
RBC: 2.81 MIL/uL — ABNORMAL LOW (ref 3.87–5.11)
WBC: 5.9 10*3/uL (ref 4.0–10.5)

## 2011-12-26 MED ORDER — BACITRACIN-NEOMYCIN-POLYMYXIN OINTMENT TUBE
TOPICAL_OINTMENT | CUTANEOUS | Status: DC | PRN
  Filled 2011-12-26: qty 15

## 2011-12-26 NOTE — Progress Notes (Signed)
Physical Therapy Treatment Patient Details Name: [name] MRN: [id number] DOB: 1950-08-18 Today's Date: 12/26/2011 Time: 4098-1191 PT Time Calculation (min): 34 min  PT Assessment / Plan / Recommendation Comments on Treatment Session       Follow Up Recommendations  Home health PT    Barriers to Discharge        Equipment Recommendations  None recommended by PT    Recommendations for Other Services OT consult  Frequency 7X/week   Plan Discharge plan remains appropriate    Precautions / Restrictions Precautions Precautions: Posterior Hip Precaution Comments: Pt reports 2/3 THP and PWB without cues Restrictions Weight Bearing Restrictions: Yes RUE Weight Bearing: Partial weight bearing RUE Partial Weight Bearing Percentage or Pounds: 50   Pertinent Vitals/Pain 3/10 with activity     Mobility  Bed Mobility Bed Mobility: Sit to Supine Sit to Supine: 4: Min assist Details for Bed Mobility Assistance: cues for sequence and THP, min assist with R LE Transfers Transfers: Sit to Stand;Stand to Sit Sit to Stand: 4: Min assist Stand to Sit: 4: Min assist Details for Transfer Assistance: cues for use of UEs and for LE management Ambulation/Gait Ambulation/Gait Assistance: 4: Min assist Ambulation Distance (Feet): 74 Feet (and 28) Assistive device: Rolling walker Ambulation/Gait Assistance Details: cues for ER on R, stride length, posture, and position from RW Gait Pattern: Step-to pattern    Exercises     PT Diagnosis:    PT Problem List:   PT Treatment Interventions:     PT Goals Acute Rehab PT Goals PT Goal Formulation: With patient Time For Goal Achievement: 12/30/11 Potential to Achieve Goals: Good Pt will go Supine/Side to Sit: with supervision PT Goal: Supine/Side to Sit - Progress: Goal set today Pt will go Sit to Supine/Side: with supervision PT Goal: Sit to Supine/Side - Progress: Goal set today Pt will go Sit to Stand: with supervision PT Goal: Sit  to Stand - Progress: Progressing toward goal Pt will go Stand to Sit: with supervision PT Goal: Stand to Sit - Progress: Progressing toward goal Pt will Ambulate: 51 - 150 feet;with supervision;with rolling walker PT Goal: Ambulate - Progress: Progressing toward goal Pt will Go Up / Down Stairs: 1-2 stairs;with min assist;with rolling walker PT Goal: Up/Down Stairs - Progress: Goal set today  Visit Information  Last PT Received On: 12/26/11 Assistance Needed: +1    Subjective Data  Subjective: My foot keeps wanting to turn in  Patient Stated Goal: I want to go back to work   Cognition  Overall Cognitive Status: Appears within functional limits for tasks assessed/performed Arousal/Alertness: Awake/alert Orientation Level: Appears intact for tasks assessed Behavior During Session: [facility] for tasks performed    Balance     End of Session PT - End of Session Activity Tolerance: Patient tolerated treatment well;Patient limited by fatigue Patient left: in bed;with call bell/phone within reach;with nursing in room Nurse Communication: Mobility status    Tashema Tiller 12/26/2011, 4:00 PM

## 2011-12-26 NOTE — Op Note (Signed)
NAMETULSI, CROSSETT                 ACCOUNT NO.:  [id number]  MEDICAL RECORD NO.:  [id number]  LOCATION:  1609                         FACILITY:  [facility]  PHYSICIAN:  Madlyn Frankel. Charlann Boxer, M.D.  DATE OF BIRTH:  October 09, 1950  DATE OF PROCEDURE:  12/25/2011 DATE OF DISCHARGE:                              OPERATIVE REPORT   PREOPERATIVE DIAGNOSIS:  Failed right total hip replacement.  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  This is an S-ROM component with findings of metal debris in the joint area.  The patient had a previous metal on metal hip, I think that the metal on metal debris was coming more from the loose femoral component as opposed to anything else.  PROCEDURE:  Revision right total hip replacement utilizing a DePuy solution 10 inch bowed stem 15 mm, with a large proximal femoral body, a 36 +1.5 delta ceramic head, and a 36 +4 10 degree face changing lipped liner with the lip portion at approximately 9 o'clock in this right hip.  SURGEON:  Madlyn Frankel. Charlann Boxer, M.D.  ASSISTANT:  Lanney Gins, PA  Please note that Mr. Carmon Sails was present for the entirety of the case, utilized and critical for management of the right lower extremity during the case, retractor management and protection of soft tissue structures as well as primary wound closure.  ANESTHESIA:  General.  SPECIMEN:  None needed and none sent.  COMPLICATIONS:  None.  DRAINS:  One medium Hemovac.  BLOOD LOSS:  About 2000 mL.  No blood products given.  INDICATION FOR PROCEDURE:  Ms. Manson Passey is a 61 year old female patient of mine with a revision right total knee replacement performed by myself about 3 years ago.  The reason for that revision was due to an index right total hip replacement with persistent instability and dislocations.  That surgery required an osteotomy and revision of the femoral component utilizing a SROM component for revision.  She  had been followed clinically and though did not have a significant amount of pain was noted to have some loosening characteristics of her SROM component as it was developing a pedestal distally as well as tilting into varus.  She began having onset of pain and bone scan confirmed loosening concerns and consent was ultimately obtained for revision surgery for pain relief.  The risk and benefits of recurrent instability, infection, DVT as well as persistent ingrowth or lack of ingrowth resulting in failure of this component were discussed and reviewed.  Consent was obtained for benefit of pain relief.  PROCEDURE:  The patient was brought to the operative theater.  Once adequate anesthesia, preoperative antibiotics, Ancef were administered she was positioned in the left lateral decubitus position right side up. The right lower extremity was then prepped and draped in a sterile fashion.  A time-out was performed identifying the patient, planned procedure, and extremity.  After time out, identifying the patient, planned procedure, and extremity, the patient's old incision was excised.  Soft tissue dissection was carried out on the proximal portion of this incision for exposure of the hip.  Once I went through the iliotibial band and gluteal fascia we encountered  this metal stained synovial capsule, which I opened and then excised the stained synovium.  The hip was dislocated with the femoral component noted to be very loose.  This included the proximal sleeve.  The hip was dislocated and the femoral component was removed easily using a slap hammer.  After I cleared out the medial aspect of the greater trochanter to prevent any complications removing this to a bony block.  Following curetting out the canal and to remove any of this metal debris I did bring the mini C-arm and draped it up.  I confirmed the orientation of the femur as there was this pedestal as well as a large anterior bow of  the femur that we were aware of from a preoperative state.  Once I identified where the pedestal was and confirmed it in orthogonal views I placed the drill bit and a 4.5 drill into the pedestal and drilled through this.  I then went up to 6 mm and then passed a guidewire through this.  I then reamed with flexible reamers up to 15 mm in effort to try to clear out this pedestal a bit.  At this point, I had initially planned to do a procedure with the Reclaim hip system.  When I passed these reamers the 140 mm stem was too short.  When I passed the 190 mm stem, which was the maximum, I was very concerned about the proximity of the stem to the anterior cortex.  After some deliberation and discussion of options including the possible need for an osteotomy of the femur to correct the anterior bow we brought into solution the stem, then I passed a 13.5 10 inch bowed femoral trial.  This did not appear to be making as much contact on the anterior cortex as the previously noted straight stem.  Based on this, I chose to use a solution stem.  Final preparations were made based on the remaining that had already been done I was able to pass the 15 mm trial down.  I confirmed the position of it in AP and lateral planes, particularly the distal segments and felt this was going to give me the best purchase.  We thus chose the 10 inch solution 15 mm stem with a large proximal body.  This was impacted carefully trying to maintain as much anteversion as possible.  It was impacted to a level where the final trial was positioned.  Please note, I had done a trial reduction confirming the placement of the stent.  I also wanted to leave it proud compared to the neck cut as we had lost length on this side over time.  Once I had the stem and this final plate I did retrial with a 36.5 ball to make certain I was going to be able to reduce the hip.  Once I had confirmed this we chose the final 36 +1.5 ceramic  ball.  In addition, I had maintained the previously placed acetabular shell.  I removed 2 of the acetabular screws with the possibility of changing the cup orientation, however, I was glad that I did not do this as though the cup had an excess amount of flexion based on the predetermined location of the stem based on the anterior bow of the femur I needed all the anteversion of the cup I could get to prevent any instability.  I even chose to use a 36 +4 10 degree lip liner to prevent any concern for subluxation.  Following irrigating the  acetabular shell out, a 36 +4 10 degree lip or face changing liner was positioned within the lip portion positioned at 9 o'clock. The final 36 1.5 ball was impacted on the clean dry trunnion.  The hip was reduced.  The hip had been irrigated throughout the case using approximately 3 L of normal saline solution of irrigation.  I placed a medium Hemovac drain deep.  We reapproximated the iliotibial band and gluteal fascia using a combination of 1 Vicryl in running V-Loc 0 suture.  The remainder of the wound was closed with 2-0 Vicryl to the entire extent with staples on the skin.  The skin was cleaned, dried and dressed sterilely with the drain site dressed separately.  She was then extubated and brought into the recovery room in stable condition tolerating the procedure well.  I probably will leave her partial weightbearing for 4-6 weeks to allow for bone healing, and begin some remodeling processes and hopefully avoid any complications based on the size of the stem etc.     Madlyn Frankel. Charlann Boxer, M.D.     MDO/MEDQ  D:  12/25/2011  T:  12/26/2011  Job:  [id number]
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
## 2011-12-26 NOTE — Progress Notes (Signed)
  Subjective: 1 Day Post-Op Procedure(s) (LRB): TOTAL HIP REVISION (Right)   Patient reports pain as mild, pain well controlled. No events throughout the night.   Objective:   VITALS:   Filed Vitals:   12/26/11 0800  BP: 96/62  Pulse: 60  Temp: 98.2 F (36.8 C)   Resp: 16    Neurovascular intact Dorsiflexion/Plantar flexion intact Incision: dressing C/D/I No cellulitis present Compartment soft  LABS  Basename 12/26/11 0432 12/25/11 1750  HGB 7.8* 8.8*  HCT 23.2* 26.8*  WBC 5.9 --  PLT 169 --     Basename 12/26/11 0432  NA 136  K 3.7  BUN 8  CREATININE 0.62  GLUCOSE 128*     Assessment/Plan: 1 Day Post-Op Procedure(s) (LRB): TOTAL HIP REVISION (Right)   HV drain d/c'ed Foley cath d/c'ed Advance diet Up with therapy D/C IV fluids Plan for discharge tomorrow if continues to do well   Anastasio Auerbach. Aristide Waggle   PAC  12/26/2011, 8:51 AM

## 2011-12-27 LAB — BASIC METABOLIC PANEL
BUN: 8 mg/dL (ref 6–23)
Chloride: 107 mEq/L (ref 96–112)
Creatinine, Ser: 0.66 mg/dL (ref 0.50–1.10)
GFR calc Af Amer: >90 mL/min (ref >90)
GFR calc non Af Amer: >90 mL/min (ref >90)

## 2011-12-27 LAB — CBC
HCT: 20 % — ABNORMAL LOW (ref 36.0–46.0)
MCHC: 34.5 g/dL (ref 30.0–36.0)
MCV: 83 fL (ref 78.0–100.0)
RDW: 15.5 % (ref 11.5–15.5)

## 2011-12-27 MED ORDER — FUROSEMIDE 10 MG/ML IJ SOLN
10.0000 mg | Freq: Once | INTRAMUSCULAR | Status: DC
  Filled 2011-12-27: qty 1

## 2011-12-27 MED ORDER — FUROSEMIDE 10 MG/ML IJ SOLN
10.0000 mg | Freq: Once | INTRAMUSCULAR | Status: AC
  Administered 2011-12-27: 10 mg via INTRAVENOUS
  Filled 2011-12-27: qty 1

## 2011-12-27 NOTE — Progress Notes (Signed)
Physical Therapy Treatment Patient Details Name: [name] MRN: [id number] DOB: 09-Sep-1950 Today's Date: 12/27/2011 Time: 2130-8657 PT Time Calculation (min): 28 min  PT Assessment / Plan / Recommendation Comments on Treatment Session  Pt with no c/o dizziness    Follow Up Recommendations  Home health PT    Barriers to Discharge        Equipment Recommendations  None recommended by PT    Recommendations for Other Services OT consult  Frequency 7X/week   Plan Discharge plan remains appropriate    Precautions / Restrictions Precautions Precautions: Posterior Hip Precaution Comments: Pt reports 2/3 THP and PWB without cues Restrictions Weight Bearing Restrictions: Yes RUE Weight Bearing: Partial weight bearing RUE Partial Weight Bearing Percentage or Pounds: 50   Pertinent Vitals/Pain 4/10; premedicated, ice pack provided    Mobility  Bed Mobility Bed Mobility: Sit to Supine Supine to Sit: 4: Min assist Details for Bed Mobility Assistance: cues for sequence and THP, min assist with R LE Transfers Transfers: Sit to Stand;Stand to Sit Sit to Stand: 4: Min guard Stand to Sit: 4: Min guard Details for Transfer Assistance: cues for use of UEs and for LE management Ambulation/Gait Ambulation/Gait Assistance: 4: Min assist;4: Min Government social research officer (Feet): 144 Feet Assistive device: Rolling walker Ambulation/Gait Assistance Details: cues for posture, position from from RW and PWB Gait Pattern: Step-to pattern    Exercises Total Joint Exercises Ankle Circles/Pumps: AROM;Both;10 reps;Supine Quad Sets: AROM;Both;10 reps;Supine Gluteal Sets: AROM;Both;10 reps;Supine Heel Slides: AAROM;15 reps;Supine;Right Hip ABduction/ADduction: AAROM;15 reps;Right;Supine   PT Diagnosis:    PT Problem List:   PT Treatment Interventions:     PT Goals Acute Rehab PT Goals PT Goal Formulation: With patient Time For Goal Achievement: 12/30/11 Potential to Achieve Goals:  Good Pt will go Supine/Side to Sit: with supervision PT Goal: Supine/Side to Sit - Progress: Progressing toward goal Pt will go Sit to Supine/Side: with supervision PT Goal: Sit to Supine/Side - Progress: Progressing toward goal Pt will go Sit to Stand: with supervision PT Goal: Sit to Stand - Progress: Progressing toward goal Pt will go Stand to Sit: with supervision PT Goal: Stand to Sit - Progress: Progressing toward goal Pt will Ambulate: 51 - 150 feet;with supervision;with rolling walker PT Goal: Ambulate - Progress: Progressing toward goal Pt will Go Up / Down Stairs: 1-2 stairs;with min assist;with rolling walker  Visit Information  Last PT Received On: 12/27/11 Assistance Needed: +1    Subjective Data  Subjective: Dr Charlann Boxer is keeping me one more day to watch my blood level Patient Stated Goal: I want to go back to work   Cognition  Overall Cognitive Status: Appears within functional limits for tasks assessed/performed Arousal/Alertness: Awake/alert Orientation Level: Appears intact for tasks assessed Behavior During Session: [facility] for tasks performed    Balance     End of Session PT - End of Session Activity Tolerance: Patient tolerated treatment well Patient left: in chair;with call bell/phone within reach Nurse Communication: Mobility status    [name] 12/27/2011, 12:47 PM

## 2011-12-27 NOTE — Progress Notes (Signed)
OT Note:  Pt has had multiple surgeries and can state all precautions.  She uses AE now and has all DME Reviewed standing for hygiene.  She will have intermittent A and does not need skilled OT at this time.  [place], OTR/L [phone] 12/27/2011

## 2011-12-27 NOTE — Progress Notes (Signed)
Physical Therapy Treatment Patient Details Name: [name] MRN: [id number] DOB: 12-26-1950 Today's Date: 12/27/2011 Time: 6213-0865 PT Time Calculation (min): 17 min  PT Assessment / Plan / Recommendation Comments on Treatment Session  Pt with no c/o dizziness    Follow Up Recommendations  Home health PT    Barriers to Discharge        Equipment Recommendations  None recommended by PT    Recommendations for Other Services OT consult  Frequency 7X/week   Plan Discharge plan remains appropriate    Precautions / Restrictions Precautions Precautions: Posterior Hip Precaution Comments: Pt reports 2/3 THP and PWB without cues Restrictions Weight Bearing Restrictions: Yes RUE Weight Bearing: Partial weight bearing RUE Partial Weight Bearing Percentage or Pounds: 50   Pertinent Vitals/Pain 3/10    Mobility  Bed Mobility Bed Mobility: Sit to Supine Supine to Sit: 4: Min assist Details for Bed Mobility Assistance: cues for sequence and THP, min assist with R LE Transfers Transfers: Sit to Stand;Stand to Sit Sit to Stand: 4: Min guard Stand to Sit: 4: Min guard Ambulation/Gait Ambulation/Gait Assistance: 4: Min guard Ambulation Distance (Feet): 160 Feet Assistive device: Rolling walker Ambulation/Gait Assistance Details: min cues for position from RW Gait Pattern: Step-to pattern    Exercises     PT Diagnosis:    PT Problem List:   PT Treatment Interventions:     PT Goals Acute Rehab PT Goals PT Goal Formulation: With patient Time For Goal Achievement: 12/30/11 Potential to Achieve Goals: Good Pt will go Supine/Side to Sit: with supervision PT Goal: Supine/Side to Sit - Progress: Progressing toward goal Pt will go Sit to Supine/Side: with supervision PT Goal: Sit to Supine/Side - Progress: Progressing toward goal Pt will go Sit to Stand: with supervision PT Goal: Sit to Stand - Progress: Progressing toward goal Pt will go Stand to Sit: with supervision PT  Goal: Stand to Sit - Progress: Progressing toward goal Pt will Ambulate: 51 - 150 feet;with supervision;with rolling walker PT Goal: Ambulate - Progress: Progressing toward goal  Visit Information  Last PT Received On: 12/27/11 Assistance Needed: +1    Subjective Data  Subjective: I'm feeling better now since the blood Patient Stated Goal: I want to go back to work   Cognition  Overall Cognitive Status: Appears within functional limits for tasks assessed/performed Arousal/Alertness: Awake/alert Orientation Level: Appears intact for tasks assessed Behavior During Session: [facility] for tasks performed    Balance     End of Session PT - End of Session Activity Tolerance: Patient tolerated treatment well Patient left: in chair;with call bell/phone within reach Nurse Communication: Mobility status    [name] 12/27/2011, 4:01 PM
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
## 2011-12-28 ENCOUNTER — Encounter (HOSPITAL_COMMUNITY): Payer: Self-pay | Admitting: Orthopedic Surgery

## 2011-12-28 LAB — TYPE AND SCREEN: Unit division: 0

## 2011-12-28 LAB — CBC
Hemoglobin: 8.1 g/dL — ABNORMAL LOW (ref 12.0–15.0)
MCH: 28.6 pg (ref 26.0–34.0)
MCHC: 34 g/dL (ref 30.0–36.0)

## 2011-12-28 MED ORDER — FERROUS SULFATE 325 (65 FE) MG PO TABS: 325.0000 mg | ORAL_TABLET | Freq: Three times a day (TID) | ORAL | Status: AC

## 2011-12-28 MED ORDER — OXYCODONE HCL 5 MG PO TABS: 5.0000 mg | ORAL_TABLET | ORAL | Status: AC | PRN

## 2011-12-28 MED ORDER — DSS 100 MG PO CAPS: 100.0000 mg | ORAL_CAPSULE | Freq: Two times a day (BID) | ORAL | Status: AC

## 2011-12-28 MED ORDER — ASPIRIN EC 325 MG PO TBEC: 325.0000 mg | DELAYED_RELEASE_TABLET | Freq: Two times a day (BID) | ORAL | Status: AC

## 2011-12-28 MED ORDER — POLYETHYLENE GLYCOL 3350 17 G PO PACK: 17.0000 g | PACK | Freq: Two times a day (BID) | ORAL | Status: AC

## 2011-12-28 MED ORDER — DIPHENHYDRAMINE HCL 25 MG PO CAPS: 25.0000 mg | ORAL_CAPSULE | Freq: Four times a day (QID) | ORAL | Status: DC | PRN

## 2011-12-28 MED ORDER — METHOCARBAMOL 500 MG PO TABS: 500.0000 mg | ORAL_TABLET | Freq: Four times a day (QID) | ORAL | Status: AC | PRN
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
## 2011-12-28 NOTE — Progress Notes (Signed)
Pt to d/c home. AVS reviewed. Pt capable of verbalizing medications and follow-up appointments. Remains hemodynamically stable. No signs and symptoms of distress. Educated pt to return to ER in the case of SOB, dizziness, or chest pain.  Pt taken down in wheelchair. Sister will take patient home. Pt has her equipment and is set up for home health.

## 2011-12-28 NOTE — Progress Notes (Signed)
Physical Therapy Treatment Patient Details Name: TALEYAH HILLMAN MRN: [id number] DOB: [birth year] Today's Date: 12/28/2011 Time: 4098-1191 PT Time Calculation (min): 34 min  PT Assessment / Plan / Recommendation Comments on Treatment Session  Pt in good spirits and looking forward to d/c    Follow Up Recommendations  Home health PT    Barriers to Discharge        Equipment Recommendations  None recommended by PT    Recommendations for Other Services OT consult  Frequency 7X/week   Plan Discharge plan remains appropriate    Precautions / Restrictions Precautions Precautions: Posterior Hip Precaution Comments: pt reports all THP without cues Restrictions Weight Bearing Restrictions: Yes RUE Weight Bearing: Partial weight bearing RUE Partial Weight Bearing Percentage or Pounds: 50   Pertinent Vitals/Pain 3/10. Ice packs provided    Mobility  Bed Mobility Bed Mobility: Supine to Sit Supine to Sit: 5: Supervision Details for Bed Mobility Assistance: min cues for THP Transfers Transfers: Sit to Stand;Stand to Sit Sit to Stand: 5: Supervision Stand to Sit: 5: Supervision Ambulation/Gait Ambulation/Gait Assistance: 4: Min guard;5: Supervision Ambulation Distance (Feet): 200 Feet Assistive device: Rolling walker Ambulation/Gait Assistance Details: min cues for position from RW and PWB Gait Pattern: Step-to pattern Stairs: Yes Stairs Assistance: 4: Min assist Stairs Assistance Details (indicate cue type and reason): cues for sequence and foot/RW placement Stair Management Technique: No rails;Step to pattern;Backwards;With walker Number of Stairs: 1  (twice)    Exercises Total Joint Exercises Ankle Circles/Pumps: AROM;Both;20 reps;Supine Quad Sets: AAROM;20 reps;Supine;Both Gluteal Sets: AROM;Both;10 reps;Supine Heel Slides: AAROM;20 reps;Right;Supine Hip ABduction/ADduction: AAROM;20 reps;Supine;Right   PT Diagnosis:    PT Problem List:   PT Treatment Interventions:      PT Goals Acute Rehab PT Goals PT Goal Formulation: With patient Time For Goal Achievement: 12/30/11 Potential to Achieve Goals: Good Pt will go Supine/Side to Sit: with supervision PT Goal: Supine/Side to Sit - Progress: Met Pt will go Sit to Supine/Side: with supervision PT Goal: Sit to Supine/Side - Progress: Progressing toward goal Pt will go Sit to Stand: with supervision PT Goal: Sit to Stand - Progress: Met Pt will go Stand to Sit: with supervision PT Goal: Stand to Sit - Progress: Met Pt will Ambulate: 51 - 150 feet;with supervision;with rolling walker PT Goal: Ambulate - Progress: Met Pt will Go Up / Down Stairs: 1-2 stairs;with min assist;with rolling walker PT Goal: Up/Down Stairs - Progress: Met  Visit Information  Last PT Received On: 12/28/11 Assistance Needed: +1    Subjective Data  Subjective: I'm going home today Patient Stated Goal: I want to go back to work   Cognition  Overall Cognitive Status: Appears within functional limits for tasks assessed/performed Arousal/Alertness: Awake/alert Orientation Level: Appears intact for tasks assessed Behavior During Session: [facility] for tasks performed    Balance     End of Session PT - End of Session Activity Tolerance: Patient tolerated treatment well Patient left: in chair;with call bell/phone within reach Nurse Communication: Mobility status    Wilferd Ritson 12/28/2011, 12:14 PM

## 2011-12-28 NOTE — Progress Notes (Signed)
  Subjective: 3 Days Post-Op Procedure(s) (LRB): TOTAL HIP REVISION (Right)   Patient reports pain as mild, pain well controlled. No events throughout the night. Feels better after receiving some blood. Ready to be discharged home.  Objective:   VITALS:   Filed Vitals:   12/28/11 0730  BP: 106/66  Pulse: 65  Temp: 99.1 F (37.3 C)   Resp: 17    Neurovascular intact Dorsiflexion/Plantar flexion intact Incision: dressing C/D/I No cellulitis present Compartment soft  LABS  Basename 12/28/11 0422 12/27/11 0352 12/26/11 0432  HGB 8.1* 6.9* 7.8*  HCT 23.8* 20.0* 23.2*  WBC 5.3 5.6 5.9  PLT 143* 141* 169     Basename 12/27/11 0352 12/26/11 0432  NA 138 136  K 3.4* 3.7  BUN 8 8  CREATININE 0.66 0.62  GLUCOSE 113* 128*     Assessment/Plan: 3 Days Post-Op Procedure(s) (LRB): TOTAL HIP REVISION (Right)   Up with therapy Discharge home with home health Follow up in 2 weeks at [facility]  Follow-up Information    Follow up with OLIN,Jerad Dunlap D in 2 weeks.   Contact information:   [facility] [address] [place] [address] [phone]          Anastasio Auerbach. Clovis Mankins   PAC  12/28/2011, 8:00 AM

## 2011-12-29 NOTE — Care Management Note (Signed)
    Page 1 of 2   12/29/2011     8:44:09 AM   CARE MANAGEMENT NOTE 12/29/2011  Patient:  [name], [name]   Account Number:  [id number]  Date Initiated:  12/26/2011  Documentation initiated by:  Colleen Can  Subjective/Objective Assessment:   dx failed rt total hip replacemnt; revision  total hip replacemnt     Action/Plan:   HOME WITH [facility] SERVICES   Anticipated DC Date:  12/29/2011   Anticipated DC Plan:  HOME W HOME HEALTH SERVICES  In-house referral  NA      DC Planning Services  CM consult      [facility] Choice  HOME HEALTH   Choice offered to / List presented to:     DME arranged  NA      DME agency  NA     HH arranged  HH-2 PT      [facility] agency  [facility]   Status of service:  Completed, signed off Medicare Important Message given?  NO (If response is "NO", the following Medicare IM given date fields will be blank) Date Medicare IM given:   Date Additional Medicare IM given:    Discharge Disposition:  HOME W HOME HEALTH SERVICES  Per UR Regulation:  Reviewed for med. necessity/level of care/duration of stay  If discussed at Long Length of Stay Meetings, dates discussed:    Comments:

## 2012-06-13 ENCOUNTER — Other Ambulatory Visit: Payer: Self-pay | Admitting: Adult Health

## 2012-06-13 DIAGNOSIS — Z139 Encounter for screening, unspecified: Secondary | ICD-10-CM

## 2012-07-11 ENCOUNTER — Ambulatory Visit (HOSPITAL_COMMUNITY)
Admission: RE | Admit: 2012-07-11 | Discharge: 2012-07-11 | Disposition: A | Discharge status: Home / Self Care | Payer: Managed Care, Other (non HMO) | Source: Ambulatory Visit | Attending: Adult Health | Admitting: Adult Health

## 2012-07-11 DIAGNOSIS — Z139 Encounter for screening, unspecified: Secondary | ICD-10-CM

## 2012-07-11 DIAGNOSIS — Z1231 Encounter for screening mammogram for malignant neoplasm of breast: Secondary | ICD-10-CM | POA: Insufficient documentation

## 2013-06-24 ENCOUNTER — Other Ambulatory Visit (HOSPITAL_COMMUNITY): Payer: Self-pay | Admitting: Family Medicine

## 2013-06-24 DIAGNOSIS — Z139 Encounter for screening, unspecified: Secondary | ICD-10-CM

## 2013-07-18 ENCOUNTER — Ambulatory Visit (HOSPITAL_COMMUNITY): Payer: Managed Care, Other (non HMO)

## 2013-08-22 ENCOUNTER — Encounter: Payer: Self-pay | Admitting: Neurology

## 2013-08-22 ENCOUNTER — Ambulatory Visit (INDEPENDENT_AMBULATORY_CARE_PROVIDER_SITE_OTHER): Payer: 59 | Admitting: Neurology

## 2013-08-22 ENCOUNTER — Encounter (INDEPENDENT_AMBULATORY_CARE_PROVIDER_SITE_OTHER): Payer: Self-pay

## 2013-08-22 VITALS — BP 119/76 | HR 58 | Temp 97.1°F | Ht 62.0 in | Wt 220.0 lb

## 2013-08-22 DIAGNOSIS — M129 Arthropathy, unspecified: Secondary | ICD-10-CM

## 2013-08-22 DIAGNOSIS — J3489 Other specified disorders of nose and nasal sinuses: Secondary | ICD-10-CM

## 2013-08-22 DIAGNOSIS — R0989 Other specified symptoms and signs involving the circulatory and respiratory systems: Secondary | ICD-10-CM

## 2013-08-22 DIAGNOSIS — R400 Somnolence: Secondary | ICD-10-CM

## 2013-08-22 DIAGNOSIS — R0609 Other forms of dyspnea: Secondary | ICD-10-CM

## 2013-08-22 DIAGNOSIS — M1990 Unspecified osteoarthritis, unspecified site: Secondary | ICD-10-CM

## 2013-08-22 DIAGNOSIS — G479 Sleep disorder, unspecified: Secondary | ICD-10-CM

## 2013-08-22 DIAGNOSIS — R404 Transient alteration of awareness: Secondary | ICD-10-CM

## 2013-08-22 DIAGNOSIS — G4726 Circadian rhythm sleep disorder, shift work type: Secondary | ICD-10-CM

## 2013-08-22 DIAGNOSIS — R0981 Nasal congestion: Secondary | ICD-10-CM

## 2013-08-22 DIAGNOSIS — E669 Obesity, unspecified: Secondary | ICD-10-CM

## 2013-08-22 DIAGNOSIS — R0683 Snoring: Secondary | ICD-10-CM

## 2013-08-22 NOTE — Patient Instructions (Signed)
We will try to do a sleep study during the day.

## 2013-08-22 NOTE — Progress Notes (Signed)
Subjective:    Patient ID: [name] is a 63 y.o. female.  HPI    Star Age, MD, PhD [facility] [address] [place], [place] [address]  Dear Dr. Hilma Favors,   I saw your patient, [name], upon your kind request in my neurologic clinic today for initial consultation of her sleep disorder, in particular, concern for obstructive sleep apnea in the context of snoring and shift work. The patient is unaccompanied today. As you know, [name] is a very pleasant 63 year old right-handed woman with an underlying medical history of hypertension, chronic constipation, arthritis, status post b/l THR surgeries with right sided revision, and b/l TKAs, who reports snoring, somnolence and difficulty with sleep maintenance and nonrestorative sleep. She has been on generic Ambien for years. She has had 2 sleep study, the last one over 5 years ago and was told she had insomnia, but no obvious OSA. 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 She has been working from 4 PM to 4 AM for the past 3 years. She is not feeling rested, she has no energy, no motivation. She wonders, if she has depression.  She has done better with third shift in the distant past. She now works 3 days of 12 hours each day one week and the next week, she works 3 days of 12 hours each, plus a 4th day of 8 hours.  She tried Benadryl, but it did not work for a prolonged period of time, Melatonin did not work, Z quil caused her to feel like a "Zombie", she may have tried temazepam, but not trazodone. She did not do well on Lunesta.   Her typical bedtime is reported to be around 5 AM and usual wake time is around 1:30 PM. Sleep onset typically occurs within 60 minutes, after taking the Ambien. The TV is on and on timer and she uses ear plugs. She wakes up at 9 to 9:30 and she has to go to the bathroom. She goes to the bathroom 3  times. She feels marginally to poorly rested. She occasionally wakes up with HAs.   She reports excessive somnolence (EDS) and Her Epworth Sleepiness Score (ESS) is 9/24 today. She snores and has been told that she has some breathing pauses while asleep. She drinks 1-2 cups of coffee per month, usually only at work.   Her Past Medical History Is Significant For: Past Medical History  Diagnosis Date  . Arthritis, rheumatoid   . HTN (hypertension)   . Hyperlipidemia   . Vitamin D deficiency   . Osteoarthritis   . Complication of anesthesia     itching after spinal with knee replacment, stop breathing at times after 2001 hip replacment surgery  . Diabetes mellitus     diet controlled  --NOTE FROM DR. GOLDING PT'S AIC WAS 5.5 ON 12/19/11  . Obesity     Her Past Surgical History Is Significant For: Past Surgical History  Procedure Laterality Date  . Colonoscopy  10/20/02    normal rectum/left-sided diverticula  . Total hip arthroplasty  2001/2008    multiple surgeries on right hip including replacements. Two replacements and four dislocations.   . Total knee arthroplasty  2008    left  . Partial hysterectomy  1987  . Toe surgery      second toe on both feet, bunionectomy  . Total hip arthroplasty  2010  left  . Colonoscopy  07/06/2011    Procedure: COLONOSCOPY;  Surgeon: Daneil Dolin, MD;  [facility];  Service: Endoscopy;  Laterality: N/A;  2:00  . Abdominal hysterectomy  1989  . Total knee arthroplasty  09/04/2011    Procedure: TOTAL KNEE ARTHROPLASTY;  Surgeon: Mauri Pole, MD;  Location: [facility];  Service: Orthopedics;  Laterality: Right;  . Joint replacement    . Total hip revision  12/25/2011    Procedure: TOTAL HIP REVISION;  Surgeon: Mauri Pole, MD;  Location: [facility];  Service: Orthopedics;  Laterality: Right;    Her Family History Is Significant For: Family History  Problem Relation Age of Onset  . Colon polyps Sister   . Heart failure Mother   .  Diabetes Mother   . Hypertension Mother   . Liver disease Father   . Hypertension Sister   . Hypertension Sister   . Hypertension Brother   . Diabetes Brother   . Heart disease Brother     Her Social History Is Significant For: History   Social History  . Marital Status: Divorced    Spouse Name: N/A    Number of Children: 1  . Years of Education: N/A   Occupational History  .  [employer]   Social History Main Topics  . Smoking status: Never Smoker   . Smokeless tobacco: Never Used  . Alcohol Use: No  . Drug Use: No  . Sexual Activity: None   Other Topics Concern  . None   Social History Narrative  . None    Her Allergies Are:  No Known Allergies:   Her Current Medications Are:  Outpatient Encounter Prescriptions as of 08/22/2013  Medication Sig  . lisinopril-hydrochlorothiazide (PRINZIDE,ZESTORETIC) 20-12.5 MG per tablet Take 1 tablet by mouth daily before breakfast.   . meloxicam (MOBIC) 15 MG tablet Take 1 tablet by mouth daily.  . Psyllium (RA FIBER SUPPLEMENT PO) Take 1 capsule by mouth 2 (two) times daily.   Marland Kitchen zolpidem (AMBIEN) 10 MG tablet Take 1 tablet by mouth at bedtime as needed and may repeat dose one time if needed.  . diphenhydrAMINE (BENADRYL) 25 mg capsule Take 1 capsule (25 mg total) by mouth every 6 (six) hours as needed for itching, allergies or sleep.  . [DISCONTINUED] oxyCODONE (OXYCONTIN) 10 MG 12 hr tablet Take 10 mg by mouth every 12 (twelve) hours.  :  Review of Systems:  Out of a complete 14 point review of systems, all are reviewed and negative with the exception of these symptoms as listed below:  Review of Systems  Constitutional: Positive for activity change, appetite change and fatigue.  HENT: Positive for tinnitus.   Eyes: Negative.   Respiratory: Negative.   Cardiovascular: Negative.   Gastrointestinal: Positive for constipation.  Endocrine: Negative.   Genitourinary: Negative.   Musculoskeletal: Positive for  arthralgias.  Skin: Negative.   Allergic/Immunologic: Negative.   Neurological: Negative.   Hematological: Negative.   Psychiatric/Behavioral: Positive for sleep disturbance (shift work, insomnia, not enough sleep).    Objective:  Neurologic Exam  Physical Exam Physical Examination:   Filed Vitals:   08/22/13 1024  BP: 119/76  Pulse: 58  Temp: 97.1 F (36.2 C)    General Examination: The patient is a very pleasant 63 y.o. female in no acute distress. She appears well-developed and well-nourished and well groomed. She is overweight.   HEENT: Normocephalic, atraumatic, pupils are equal, round and reactive to light and accommodation. Funduscopic exam  is normal with sharp disc margins noted. Extraocular tracking is good without limitation to gaze excursion or nystagmus noted. Normal smooth pursuit is noted. Hearing is grossly intact. Tympanic membranes are clear bilaterally. Face is symmetric with normal facial animation and normal facial sensation. Speech is clear with no dysarthria noted. There is no hypophonia. There is no lip, neck/head, jaw or voice tremor. Neck is supple with full range of passive and active motion. There are no carotid bruits on auscultation. Oropharynx exam reveals: mild mouth dryness, adequate dental hygiene and moderate airway crowding, due to large tongue and tonsillar size. Mallampati is class II. Tongue protrudes centrally and palate elevates symmetrically. Tonsils are 1+ to 2+ in size.   Chest: Clear to auscultation without wheezing, rhonchi or crackles noted.  Heart: S1+S2+0, regular and normal without murmurs, rubs or gallops noted.   Abdomen: Soft, non-tender and non-distended with normal bowel sounds appreciated on auscultation.  Extremities: There is no pitting edema in the distal lower extremities bilaterally. Pedal pulses are intact.  Skin: Warm and dry without trophic changes noted. There are no varicose veins.  Musculoskeletal: exam reveals  chronic arthritic changes in both hands, right more so than left. She has joint pain in both hips.  Neurologically:  Mental status: The patient is awake, alert and oriented in all 4 spheres. Her immediate and remote memory, attention, language skills and fund of knowledge are appropriate. There is no evidence of aphasia, agnosia, apraxia or anomia. Speech is clear with normal prosody and enunciation. Thought process is linear. Mood is normal and affect is normal.  Cranial nerves II - XII are as described above under HEENT exam. In addition: shoulder shrug is normal with equal shoulder height noted. Motor exam: Normal bulk, strength and tone is noted. There is no drift, tremor or rebound. Romberg is negative. Reflexes are 2+ throughout. Babinski: Toes are flexor bilaterally. Fine motor skills and coordination: intact with normal finger taps, normal hand movements, normal rapid alternating patting, normal foot taps and normal foot agility.  Cerebellar testing: No dysmetria or intention tremor on finger to nose testing. Heel to shin is unremarkable bilaterally. There is no truncal or gait ataxia.  Sensory exam: intact to light touch, pinprick, vibration, temperature sense and proprioception in the upper and lower extremities.  Gait, station and balance: She stands with mild difficulty. No veering to one side is noted. No leaning to one side is noted. Posture is age-appropriate and stance is narrow based. Gait shows normal stride length and decreased pace. No problems turning are noted. She turns in 3 steps. Tandem walk is very difficult for her due to her joint pains.                Assessment and Plan:   In summary, [name] is a very pleasant 63 y.o.-year old female with an underlying medical history of hypertension, chronic constipation, arthritis, status post multiple joint replacement surgeries, who has a sleep disorder which is complicated. She has chronic sleep maintenance issues and a  long-standing history of shift work. She is getting very little and poorly consolidated sleep. She endorses snoring and some somnolence when she is supposed to be awake and active. She also endorses some concerning findings for depression which I believe she would be secondary depression. She's never had depression before. She is suffering from arthritis pain and that is a contributor as well. Her situation is complicated. Given her history of snoring and that she has been reported to have  pauses in her breathing I think it is worthwhile to recheck with a sleep study. Unfortunately, there is no perfect sleep aid out to her. I explained this at length to her. We talked about sleep apnea and its treatment as well. She would be willing to try a CPAP if the need arises. We will try to accommodate her sleep schedule and try to do a daytime sleep study for her. She has tried multiple sleep aids and has had good success with Ambien. She will be bringing her Ambien for sleep study. She has tried [place], and over-the-counter medications including Benadryl, melatonin and Zquil. She may benefit from an antidepressant down the [place]. She may need further joint surgeries and she worries about it. At this juncture, from my end of things, we will do a sleep study. I will see her back afterwards we will discuss further options from there. I answered all her questions today and the patient was in agreement. I would like to see her back after the sleep study is completed and encouraged her to call with any interim questions, concerns, problems or updates.   Thank you very much for allowing me to participate in the care of this nice patient. If I can be of any further assistance to you please do not hesitate to call me at [phone].  Sincerely,   Star Age, MD, PhD

## 2013-09-29 ENCOUNTER — Telehealth: Payer: Self-pay | Admitting: *Deleted

## 2013-09-29 NOTE — Telephone Encounter (Signed)
Called and LM for pt about her daytime sleep test scheduled for Thursday 10/02/13.  Need to reschedule if possible for another day.  Asked her to call me at her convenience or email me if more convenient with her work schedule, left my email address.

## 2013-10-02 ENCOUNTER — Ambulatory Visit (HOSPITAL_COMMUNITY)
Admission: RE | Admit: 2013-10-02 | Discharge: 2013-10-02 | Disposition: A | Discharge status: Home / Self Care | Payer: Managed Care, Other (non HMO) | Source: Ambulatory Visit | Attending: Family Medicine | Admitting: Family Medicine

## 2013-10-02 DIAGNOSIS — Z139 Encounter for screening, unspecified: Secondary | ICD-10-CM

## 2013-10-02 DIAGNOSIS — Z1231 Encounter for screening mammogram for malignant neoplasm of breast: Secondary | ICD-10-CM | POA: Insufficient documentation

## 2013-10-30 ENCOUNTER — Ambulatory Visit (INDEPENDENT_AMBULATORY_CARE_PROVIDER_SITE_OTHER): Payer: Managed Care, Other (non HMO) | Admitting: Neurology

## 2013-10-30 DIAGNOSIS — G4700 Insomnia, unspecified: Secondary | ICD-10-CM

## 2013-10-30 DIAGNOSIS — G4720 Circadian rhythm sleep disorder, unspecified type: Secondary | ICD-10-CM

## 2013-10-30 DIAGNOSIS — G479 Sleep disorder, unspecified: Secondary | ICD-10-CM

## 2013-10-31 ENCOUNTER — Ambulatory Visit (INDEPENDENT_AMBULATORY_CARE_PROVIDER_SITE_OTHER): Payer: Managed Care, Other (non HMO) | Admitting: Adult Health

## 2013-10-31 ENCOUNTER — Encounter: Payer: Self-pay | Admitting: Adult Health

## 2013-10-31 VITALS — BP 100/60 | HR 76 | Ht 62.0 in | Wt 215.0 lb

## 2013-10-31 DIAGNOSIS — Z01419 Encounter for gynecological examination (general) (routine) without abnormal findings: Secondary | ICD-10-CM

## 2013-10-31 DIAGNOSIS — K5904 Chronic idiopathic constipation: Secondary | ICD-10-CM | POA: Insufficient documentation

## 2013-10-31 DIAGNOSIS — Z1212 Encounter for screening for malignant neoplasm of rectum: Secondary | ICD-10-CM

## 2013-10-31 DIAGNOSIS — E119 Type 2 diabetes mellitus without complications: Secondary | ICD-10-CM

## 2013-10-31 LAB — COMPREHENSIVE METABOLIC PANEL
ALK PHOS: 63 U/L (ref 39–117)
ALT: 18 U/L (ref 0–35)
AST: 22 U/L (ref 0–37)
Albumin: 4.1 g/dL (ref 3.5–5.2)
BILIRUBIN TOTAL: 0.7 mg/dL (ref 0.2–1.2)
BUN: 15 mg/dL (ref 6–23)
CO2: 27 mEq/L (ref 19–32)
Calcium: 9.5 mg/dL (ref 8.4–10.5)
Chloride: 103 mEq/L (ref 96–112)
Creat: 0.8 mg/dL (ref 0.50–1.10)
GLUCOSE: 96 mg/dL (ref 70–99)
Potassium: 4.2 mEq/L (ref 3.5–5.3)
SODIUM: 140 meq/L (ref 135–145)
TOTAL PROTEIN: 6.5 g/dL (ref 6.0–8.3)

## 2013-10-31 LAB — CBC
HCT: 39 % (ref 36.0–46.0)
Hemoglobin: 13.5 g/dL (ref 12.0–15.0)
MCH: 28.3 pg (ref 26.0–34.0)
MCHC: 34.6 g/dL (ref 30.0–36.0)
MCV: 81.8 fL (ref 78.0–100.0)
PLATELETS: 280 10*3/uL (ref 150–400)
RBC: 4.77 MIL/uL (ref 3.87–5.11)
RDW: 16.4 % — AB (ref 11.5–15.5)
WBC: 4.2 10*3/uL (ref 4.0–10.5)

## 2013-10-31 LAB — HEMOCCULT GUIAC POC 1CARD (OFFICE): FECAL OCCULT BLD: NEGATIVE

## 2013-10-31 LAB — LIPID PANEL
CHOL/HDL RATIO: 3.5 ratio
CHOLESTEROL: 211 mg/dL — AB (ref 0–200)
HDL: 60 mg/dL (ref >39)
LDL Cholesterol: 137 mg/dL — ABNORMAL HIGH (ref 0–99)
Triglycerides: 71 mg/dL (ref <150)
VLDL: 14 mg/dL (ref 0–40)

## 2013-10-31 LAB — HEMOGLOBIN A1C
Hgb A1c MFr Bld: 6 % — ABNORMAL HIGH (ref <5.7)
MEAN PLASMA GLUCOSE: 126 mg/dL — AB (ref <117)

## 2013-10-31 NOTE — Progress Notes (Signed)
Patient ID: [name], female   DOB: [birth year], 63 y.o.   MRN: [id number] History of Present Illness: [name] is a 63 year old black female in for a physical.   Current Medications, Allergies, Past Medical History, Past Surgical History, Family History and Social History were reviewed in [place] record.   Past Medical History  Diagnosis Date  . Arthritis, rheumatoid   . HTN (hypertension)   . Hyperlipidemia   . Vitamin D deficiency   . Osteoarthritis   . Complication of anesthesia     itching after spinal with knee replacment, stop breathing at times after 2001 hip replacment surgery  . Diabetes mellitus     diet controlled  --NOTE FROM DR. GOLDING PT'S AIC WAS 5.5 ON 12/19/11  . Obesity   . Constipation   . Constipation - functional 10/31/2013   Past Surgical History  Procedure Laterality Date  . Colonoscopy  10/20/02    normal rectum/left-sided diverticula  . Total hip arthroplasty  2001/2008    multiple surgeries on right hip including replacements. Two replacements and four dislocations.   . Total knee arthroplasty  2008    left  . Partial hysterectomy  1987  . Toe surgery      second toe on both feet, bunionectomy  . Total hip arthroplasty  2010    left  . Colonoscopy  07/06/2011    Procedure: COLONOSCOPY;  Surgeon: Daneil Dolin, MD;  [facility];  Service: Endoscopy;  Laterality: N/A;  2:00  . Abdominal hysterectomy  1989  . Total knee arthroplasty  09/04/2011    Procedure: TOTAL KNEE ARTHROPLASTY;  Surgeon: Mauri Pole, MD;  [facility];  Service: Orthopedics;  Laterality: Right;  . Joint replacement    . Total hip revision  12/25/2011    Procedure: TOTAL HIP REVISION;  Surgeon: Mauri Pole, MD;  [facility];  Service: Orthopedics;  Laterality: Right;  Current outpatient prescriptions:buPROPion (WELLBUTRIN XL) 300 MG 24 hr tablet, Take 300 mg by mouth daily., Disp: , Rfl: ;  diphenhydrAMINE (BENADRYL) 25 mg  capsule, Take 1 capsule (25 mg total) by mouth every 6 (six) hours as needed for itching, allergies or sleep., Disp: , Rfl: ;  lisinopril-hydrochlorothiazide (PRINZIDE,ZESTORETIC) 20-12.5 MG per tablet, Take 1 tablet by mouth daily before breakfast. , Disp: , Rfl:  meloxicam (MOBIC) 15 MG tablet, Take 1 tablet by mouth daily., Disp: , Rfl: ;  Multiple Vitamin (MULTIVITAMIN) tablet, Take 1 tablet by mouth daily., Disp: , Rfl: ;  Multiple Vitamins-Minerals (HAIR/SKIN/NAILS) TABS, Take 1 tablet by mouth daily., Disp: , Rfl: ;  omega-3 acid ethyl esters (LOVAZA) 1 G capsule, Take 2 g by mouth daily., Disp: , Rfl:  zolpidem (AMBIEN) 10 MG tablet, Take 1 tablet by mouth at bedtime as needed and may repeat dose one time if needed., Disp: , Rfl:   Review of Systems: Patient denies any headaches, blurred vision, shortness of breath, chest pain, abdominal pain, problems with urination, or intercourse. She has constipation issues and uses miralax, has used Mag. Citrate too, she has had 2 colonoscopies and has every 5 years.She is sp hysterectomy for fibroids.No joint swelling, has pain at times, no mood swings.Works 4 pm to 4 am and has trouble sleeping, had sleep study recently.    Physical Exam:BP 100/60  Pulse 76  Ht 5\' 2"  (1.575 m)  Wt 215 lb (97.523 kg)  BMI 39.31 kg/m2 General:  Well developed, well nourished, no acute distress  Skin:  Warm and dry Neck:  Midline trachea, normal thyroid Lungs; Clear to auscultation bilaterally Breast:  No dominant palpable mass, retraction, or nipple discharge Cardiovascular: Regular rate and rhythm Abdomen:  Soft, non tender, no hepatosplenomegaly Pelvic:  External genitalia is normal in appearance.  The vagina is normal in appearance for age.The cervix and uterus are absent.  No adnexal masses or tenderness noted. Rectal: Good sphincter tone, no polyps, or hemorrhoids felt.  Hemoccult negative. Extremities:  No swelling or varicosities noted Psych:  No mood  changes, alert and cooperative,seems happy   Impression: Yearly gyn exam no pap Constipation Diabetes     Plan: Physical in 1 year Mammogram yearly Colonoscopy 2019 Check CBC,CMP,TSH and lipids and A1c at pt request she is fasting Has appt with PCP in summer will give copy of labs to take to him Try 5 prunes per day to help with constipation

## 2013-10-31 NOTE — Patient Instructions (Signed)
Try prunes 5 per day Continue miralax  Physical in 1 year Mammogram yearly  Labs today Colonoscopy 2019 Constipation, Adult Constipation is when a person has fewer than 3 bowel movements a week; has difficulty having a bowel movement; or has stools that are dry, hard, or larger than normal. As people grow older, constipation is more common. If you try to fix constipation with medicines that make you have a bowel movement (laxatives), the problem may get worse. Long-term laxative use may cause the muscles of the colon to become weak. A low-fiber diet, not taking in enough fluids, and taking certain medicines may make constipation worse. CAUSES   Certain medicines, such as antidepressants, pain medicine, iron supplements, antacids, and water pills.   Certain diseases, such as diabetes, irritable bowel syndrome (IBS), thyroid disease, or depression.   Not drinking enough water.   Not eating enough fiber-rich foods.   Stress or travel.  Lack of physical activity or exercise.  Not going to the restroom when there is the urge to have a bowel movement.  Ignoring the urge to have a bowel movement.  Using laxatives too much. SYMPTOMS   Having fewer than 3 bowel movements a week.   Straining to have a bowel movement.   Having hard, dry, or larger than normal stools.   Feeling full or bloated.   Pain in the lower abdomen.  Not feeling relief after having a bowel movement. DIAGNOSIS  Your caregiver will take a medical history and perform a physical exam. Further testing may be done for severe constipation. Some tests may include:   A barium enema X-ray to examine your rectum, colon, and sometimes, your small intestine.  A sigmoidoscopy to examine your lower colon.  A colonoscopy to examine your entire colon. TREATMENT  Treatment will depend on the severity of your constipation and what is causing it. Some dietary treatments include drinking more fluids and eating more  fiber-rich foods. Lifestyle treatments may include regular exercise. If these diet and lifestyle recommendations do not help, your caregiver may recommend taking over-the-counter laxative medicines to help you have bowel movements. Prescription medicines may be prescribed if over-the-counter medicines do not work.  HOME CARE INSTRUCTIONS   Increase dietary fiber in your diet, such as fruits, vegetables, whole grains, and beans. Limit high-fat and processed sugars in your diet, such as [place] fries, hamburgers, cookies, candies, and soda.   A fiber supplement may be added to your diet if you cannot get enough fiber from foods.   Drink enough fluids to keep your urine clear or pale yellow.   Exercise regularly or as directed by your caregiver.   Go to the restroom when you have the urge to go. Do not hold it.  Only take medicines as directed by your caregiver. Do not take other medicines for constipation without talking to your caregiver first. [place] IF:   You have bright red blood in your stool.   Your constipation lasts for more than 4 days or gets worse.   You have abdominal or rectal pain.   You have thin, pencil-like stools.  You have unexplained weight loss. MAKE SURE YOU:   Understand these instructions.  Will watch your condition.  Will get help right away if you are not doing well or get worse. Document Released: 03/24/2004 Document Revised: 09/18/2011 Document Reviewed: 04/07/2013 [facility] Patient Information 2014 [place], [place].

## 2013-11-01 LAB — TSH: TSH: 0.837 u[IU]/mL (ref 0.350–4.500)

## 2013-11-03 ENCOUNTER — Telehealth: Payer: Self-pay | Admitting: Adult Health

## 2013-11-03 NOTE — Telephone Encounter (Signed)
Pt aware of labs will mail to pt.

## 2013-11-19 ENCOUNTER — Telehealth: Payer: Self-pay | Admitting: Neurology

## 2013-11-19 NOTE — Telephone Encounter (Signed)
*   PLEASE SEND PATIENT A 2 WEEK SLEEP DIARY TO FILL OUT BEFORE HER NEXT APPOINTMENT*  Please call and notify the patient that the recent sleep study did not show any significant obstructive sleep apnea. Please inform patient that I would like to go over the details of the study during a follow up appointment and if not already previously scheduled, arrange a followup appointment (please utilize a followu-up slot). Also, route or fax report to PCP and referring MD, if other than PCP.  Once you have spoken to patient, you can close this encounter.   Thanks,  Star Age, MD, PhD [facility])

## 2013-11-19 NOTE — Telephone Encounter (Signed)
I called and left a message for the patient about her recent sleep study results. I informed the patient that the study did not show any significant obstructive sleep apnea. Dr. Rexene Alberts would like to discuss the report in detail in a follow up appointment. I will fax a copy of the report to [place] office and mail a copy to the patient. I informed the patient to callback to the office to schedule her follow up appointment with Dr. Rexene Alberts.

## 2013-11-19 NOTE — Sleep Study (Signed)
See media tab for full report  

## 2013-11-20 ENCOUNTER — Other Ambulatory Visit: Payer: Self-pay | Admitting: *Deleted

## 2013-11-20 ENCOUNTER — Encounter: Payer: Self-pay | Admitting: *Deleted

## 2013-11-20 DIAGNOSIS — E669 Obesity, unspecified: Secondary | ICD-10-CM

## 2013-11-20 DIAGNOSIS — G479 Sleep disorder, unspecified: Secondary | ICD-10-CM

## 2013-11-20 DIAGNOSIS — R0981 Nasal congestion: Secondary | ICD-10-CM

## 2013-11-20 DIAGNOSIS — R400 Somnolence: Secondary | ICD-10-CM

## 2013-11-20 DIAGNOSIS — R0683 Snoring: Secondary | ICD-10-CM

## 2013-11-20 DIAGNOSIS — G4726 Circadian rhythm sleep disorder, shift work type: Secondary | ICD-10-CM

## 2013-11-20 NOTE — Telephone Encounter (Signed)
I called and spoke with the patient about scheduling a follow up appointment with Dr. Rexene Alberts. I informed the patient that I will be mailing her sleep study results along with a sleep diary that Dr. Rexene Alberts would like for her to fill out for 2 weeks and then bring the diary to her appointment. I informed the patient that once she receive the report and sleep diary to callback to the office to schedule her follow up appointment.

## 2014-05-11 ENCOUNTER — Encounter: Payer: Self-pay | Admitting: Adult Health

## 2014-09-02 ENCOUNTER — Other Ambulatory Visit (HOSPITAL_COMMUNITY): Payer: Self-pay | Admitting: Family Medicine

## 2014-09-02 DIAGNOSIS — Z1231 Encounter for screening mammogram for malignant neoplasm of breast: Secondary | ICD-10-CM

## 2014-09-25 ENCOUNTER — Encounter (HOSPITAL_COMMUNITY): Payer: Self-pay

## 2014-09-25 ENCOUNTER — Encounter (HOSPITAL_COMMUNITY)
Admission: RE | Admit: 2014-09-25 | Discharge: 2014-09-25 | Disposition: A | Discharge status: Home / Self Care | Payer: Managed Care, Other (non HMO) | Source: Ambulatory Visit | Attending: Orthopedic Surgery | Admitting: Orthopedic Surgery

## 2014-09-25 DIAGNOSIS — E559 Vitamin D deficiency, unspecified: Secondary | ICD-10-CM | POA: Diagnosis not present

## 2014-09-25 DIAGNOSIS — E119 Type 2 diabetes mellitus without complications: Secondary | ICD-10-CM | POA: Diagnosis not present

## 2014-09-25 DIAGNOSIS — Z6841 Body Mass Index (BMI) 40.0 and over, adult: Secondary | ICD-10-CM | POA: Diagnosis not present

## 2014-09-25 DIAGNOSIS — E785 Hyperlipidemia, unspecified: Secondary | ICD-10-CM | POA: Diagnosis not present

## 2014-09-25 DIAGNOSIS — I10 Essential (primary) hypertension: Secondary | ICD-10-CM | POA: Diagnosis not present

## 2014-09-25 DIAGNOSIS — M069 Rheumatoid arthritis, unspecified: Secondary | ICD-10-CM | POA: Diagnosis not present

## 2014-09-25 DIAGNOSIS — F419 Anxiety disorder, unspecified: Secondary | ICD-10-CM | POA: Diagnosis not present

## 2014-09-25 DIAGNOSIS — M19012 Primary osteoarthritis, left shoulder: Secondary | ICD-10-CM | POA: Diagnosis not present

## 2014-09-25 HISTORY — DX: Anxiety disorder, unspecified: F41.9

## 2014-09-25 HISTORY — DX: Headache: R51

## 2014-09-25 HISTORY — DX: Headache, unspecified: R51.9

## 2014-09-25 LAB — COMPREHENSIVE METABOLIC PANEL
ALT: 21 U/L (ref 0–35)
ANION GAP: 7 (ref 5–15)
AST: 28 U/L (ref 0–37)
Albumin: 4.1 g/dL (ref 3.5–5.2)
Alkaline Phosphatase: 74 U/L (ref 39–117)
BILIRUBIN TOTAL: 0.8 mg/dL (ref 0.3–1.2)
BUN: 14 mg/dL (ref 6–23)
CO2: 29 mmol/L (ref 19–32)
Calcium: 10.2 mg/dL (ref 8.4–10.5)
Chloride: 102 mmol/L (ref 96–112)
Creatinine, Ser: 0.84 mg/dL (ref 0.50–1.10)
GFR calc Af Amer: 85 mL/min — ABNORMAL LOW (ref >90)
GFR, EST NON AFRICAN AMERICAN: 73 mL/min — AB (ref >90)
Glucose, Bld: 102 mg/dL — ABNORMAL HIGH (ref 70–99)
Potassium: 4.1 mmol/L (ref 3.5–5.1)
SODIUM: 138 mmol/L (ref 135–145)
Total Protein: 7.3 g/dL (ref 6.0–8.3)

## 2014-09-25 LAB — CBC WITH DIFFERENTIAL/PLATELET
BASOS ABS: 0.1 10*3/uL (ref 0.0–0.1)
Basophils Relative: 1 % (ref 0–1)
EOS PCT: 8 % — AB (ref 0–5)
Eosinophils Absolute: 0.4 10*3/uL (ref 0.0–0.7)
HCT: 42.9 % (ref 36.0–46.0)
Hemoglobin: 14.3 g/dL (ref 12.0–15.0)
Lymphocytes Relative: 46 % (ref 12–46)
Lymphs Abs: 2.2 10*3/uL (ref 0.7–4.0)
MCH: 28 pg (ref 26.0–34.0)
MCHC: 33.3 g/dL (ref 30.0–36.0)
MCV: 84 fL (ref 78.0–100.0)
MONO ABS: 0.5 10*3/uL (ref 0.1–1.0)
Monocytes Relative: 10 % (ref 3–12)
Neutro Abs: 1.6 10*3/uL — ABNORMAL LOW (ref 1.7–7.7)
Neutrophils Relative %: 35 % — ABNORMAL LOW (ref 43–77)
PLATELETS: 275 10*3/uL (ref 150–400)
RBC: 5.11 MIL/uL (ref 3.87–5.11)
RDW: 15.2 % (ref 11.5–15.5)
WBC: 4.7 10*3/uL (ref 4.0–10.5)

## 2014-09-25 LAB — SURGICAL PCR SCREEN
MRSA, PCR: NEGATIVE
STAPHYLOCOCCUS AUREUS: NEGATIVE

## 2014-09-25 LAB — APTT: APTT: 32 s (ref 24–37)

## 2014-09-25 LAB — PROTIME-INR
INR: 1.03 (ref 0.00–1.49)
Prothrombin Time: 13.6 seconds (ref 11.6–15.2)

## 2014-09-25 NOTE — Pre-Procedure Instructions (Signed)
TIYA SCHRUPP  09/25/2014   Your procedure is scheduled on:  10/08/14  Report to [facility] Admitting at 530 AM.  Call this number if you have problems the morning of surgery: [phone]   Remember:   Do not eat food or drink liquids after midnight.   Take these medicines the morning of surgery with A SIP OF WATER: wellbutrin   Do not wear jewelry, make-up or nail polish.  Do not wear lotions, powders, or perfumes. You may wear deodorant.  Do not shave 48 hours prior to surgery. Men may shave face and neck.  Do not bring valuables to the hospital.  [facility] is not responsible                  for any belongings or valuables.               Contacts, dentures or bridgework may not be worn into surgery.  Leave suitcase in the car. After surgery it may be brought to your room.  For patients admitted to the hospital, discharge time is determined by your                treatment team.               Patients discharged the day of surgery will not be allowed to drive  home.  Name and phone number of your driver: family  Special Instructions: Shower using CHG 2 nights before surgery and the night before surgery.  If you shower the day of surgery use CHG.  Use special wash - you have one bottle of CHG for all showers.  You should use approximately 1/3 of the bottle for each shower.   Please read over the following fact sheets that you were given: Pain Booklet, Coughing and Deep Breathing, MRSA Information and Surgical Site Infection Prevention

## 2014-09-28 ENCOUNTER — Encounter (HOSPITAL_COMMUNITY): Payer: Self-pay

## 2014-09-28 NOTE — Progress Notes (Signed)
Anesthesia Chart Review:  Patient is a 64 year old female scheduled for left total shoulder on 10/08/14 by Dr. Onnie Graham.  History includes non-smoker, RA, HTN, HLD, diet controlled DM2, anxiety, headaches, right TKA 08/2011, right THA '01 and multiple subsequent surgeries for dislocations with most recent revision 12/2011, left THA '10, left TKA '08, hysterectomy. BMI is consistent with morbid obesity. For anesthesia concerns, she reported itching after her spinal for TKA (date?; probably in 2008 as attempt at SAB on 09/04/11 was unsuccessful and right TKA had to be done under GA) and that she "stopped breathing" a few times after her THA in '01. PCP is  Listed as Dr. Sharilyn Sites. Sleep study in 11/2013 did not show any significant obstructive or central sleep disordered breathing.  Meds include Wellbutrin, lisinopril-HCTZ, Benadryl, Lovaza, Ambien.  09/25/14 EKG: SB at 55 bpm, possible LAE, LVH, cannot rule out septal infarct (age undetermined). Since last tracing on 12/20/11 no significant change.   Preoperative labs noted.   EKG was felt stable.  No CV symptoms documented from her PAT visit. She does have RA, but with ETT used during her last few surgeries (anesthesia records are in Epic for review as needed).  If no acute changes then I anticipate that she can proceed as planned.  [facility] Short Stay Center/Anesthesiology Phone [phone] 09/28/2014 11:47 AM

## 2014-10-07 ENCOUNTER — Ambulatory Visit (HOSPITAL_COMMUNITY): Payer: Managed Care, Other (non HMO)

## 2014-10-07 ENCOUNTER — Ambulatory Visit (HOSPITAL_COMMUNITY)
Admission: RE | Admit: 2014-10-07 | Discharge: 2014-10-07 | Disposition: A | Discharge status: Home / Self Care | Payer: Managed Care, Other (non HMO) | Source: Ambulatory Visit | Attending: Family Medicine | Admitting: Family Medicine

## 2014-10-07 DIAGNOSIS — Z1231 Encounter for screening mammogram for malignant neoplasm of breast: Secondary | ICD-10-CM | POA: Diagnosis not present

## 2014-10-07 MED ORDER — LACTATED RINGERS IV SOLN: INTRAVENOUS | Status: DC

## 2014-10-07 MED ORDER — CHLORHEXIDINE GLUCONATE 4 % EX LIQD
60.0000 mL | Freq: Once | CUTANEOUS | Status: DC
  Filled 2014-10-07: qty 60

## 2014-10-07 MED ORDER — CEFAZOLIN SODIUM-DEXTROSE 2-3 GM-% IV SOLR
2.0000 g | INTRAVENOUS | Status: AC
  Administered 2014-10-08: 2 g via INTRAVENOUS
  Filled 2014-10-07: qty 50

## 2014-10-08 ENCOUNTER — Ambulatory Visit (HOSPITAL_COMMUNITY): Payer: Managed Care, Other (non HMO) | Admitting: Vascular Surgery

## 2014-10-08 ENCOUNTER — Encounter (HOSPITAL_COMMUNITY)
Admission: RE | Disposition: A | Discharge status: Home / Self Care | Payer: Self-pay | Source: Ambulatory Visit | Attending: Orthopedic Surgery

## 2014-10-08 ENCOUNTER — Encounter (HOSPITAL_COMMUNITY): Payer: Self-pay | Admitting: Surgery

## 2014-10-08 ENCOUNTER — Observation Stay (HOSPITAL_COMMUNITY)
Admission: RE | Admit: 2014-10-08 | Discharge: 2014-10-09 | Disposition: A | Discharge status: Home / Self Care | Payer: Managed Care, Other (non HMO) | Source: Ambulatory Visit | Attending: Orthopedic Surgery | Admitting: Orthopedic Surgery

## 2014-10-08 ENCOUNTER — Ambulatory Visit (HOSPITAL_COMMUNITY): Payer: Managed Care, Other (non HMO) | Admitting: Anesthesiology

## 2014-10-08 DIAGNOSIS — F419 Anxiety disorder, unspecified: Secondary | ICD-10-CM | POA: Insufficient documentation

## 2014-10-08 DIAGNOSIS — E559 Vitamin D deficiency, unspecified: Secondary | ICD-10-CM | POA: Insufficient documentation

## 2014-10-08 DIAGNOSIS — M069 Rheumatoid arthritis, unspecified: Secondary | ICD-10-CM | POA: Insufficient documentation

## 2014-10-08 DIAGNOSIS — I10 Essential (primary) hypertension: Secondary | ICD-10-CM | POA: Insufficient documentation

## 2014-10-08 DIAGNOSIS — M19012 Primary osteoarthritis, left shoulder: Secondary | ICD-10-CM | POA: Diagnosis not present

## 2014-10-08 DIAGNOSIS — E119 Type 2 diabetes mellitus without complications: Secondary | ICD-10-CM | POA: Insufficient documentation

## 2014-10-08 DIAGNOSIS — Z6841 Body Mass Index (BMI) 40.0 and over, adult: Secondary | ICD-10-CM | POA: Insufficient documentation

## 2014-10-08 DIAGNOSIS — E785 Hyperlipidemia, unspecified: Secondary | ICD-10-CM | POA: Insufficient documentation

## 2014-10-08 DIAGNOSIS — Z96619 Presence of unspecified artificial shoulder joint: Secondary | ICD-10-CM

## 2014-10-08 HISTORY — PX: TOTAL SHOULDER ARTHROPLASTY: SHX126

## 2014-10-08 HISTORY — PX: TOTAL SHOULDER REPLACEMENT: SUR1217

## 2014-10-08 LAB — GLUCOSE, CAPILLARY: Glucose-Capillary: 93 mg/dL (ref 70–99)

## 2014-10-08 SURGERY — ARTHROPLASTY, SHOULDER, TOTAL
Anesthesia: General | Site: Shoulder | Laterality: Left

## 2014-10-08 MED ORDER — DIPHENHYDRAMINE HCL 12.5 MG/5ML PO ELIX: 12.5000 mg | ORAL_SOLUTION | ORAL | Status: DC | PRN

## 2014-10-08 MED ORDER — DIAZEPAM 5 MG PO TABS: 2.5000 mg | ORAL_TABLET | Freq: Four times a day (QID) | ORAL | Status: DC | PRN

## 2014-10-08 MED ORDER — ZOLPIDEM TARTRATE 5 MG PO TABS: 5.0000 mg | ORAL_TABLET | Freq: Every evening | ORAL | Status: DC | PRN

## 2014-10-08 MED ORDER — DOCUSATE SODIUM 100 MG PO CAPS
100.0000 mg | ORAL_CAPSULE | Freq: Two times a day (BID) | ORAL | Status: DC
  Administered 2014-10-08 – 2014-10-09 (×3): 100 mg via ORAL
  Filled 2014-10-08 (×2): qty 1

## 2014-10-08 MED ORDER — VECURONIUM BROMIDE 10 MG IV SOLR
INTRAVENOUS | Status: DC | PRN
  Administered 2014-10-08 (×2): 1 mg via INTRAVENOUS

## 2014-10-08 MED ORDER — OXYCODONE HCL 5 MG/5ML PO SOLN: 5.0000 mg | Freq: Once | ORAL | Status: DC | PRN

## 2014-10-08 MED ORDER — FLEET ENEMA 7-19 GM/118ML RE ENEM: 1.0000 | ENEMA | Freq: Once | RECTAL | Status: AC | PRN

## 2014-10-08 MED ORDER — OXYCODONE HCL 5 MG PO TABS: 5.0000 mg | ORAL_TABLET | Freq: Once | ORAL | Status: DC | PRN

## 2014-10-08 MED ORDER — ALUM & MAG HYDROXIDE-SIMETH 200-200-20 MG/5ML PO SUSP: 30.0000 mL | ORAL | Status: DC | PRN

## 2014-10-08 MED ORDER — HYDROCHLOROTHIAZIDE 12.5 MG PO CAPS
12.5000 mg | ORAL_CAPSULE | Freq: Every day | ORAL | Status: DC
  Administered 2014-10-09: 12.5 mg via ORAL
  Filled 2014-10-08: qty 1

## 2014-10-08 MED ORDER — LISINOPRIL-HYDROCHLOROTHIAZIDE 20-12.5 MG PO TABS: 1.0000 | ORAL_TABLET | Freq: Every day | ORAL | Status: DC

## 2014-10-08 MED ORDER — LACTATED RINGERS IV SOLN
INTRAVENOUS | Status: DC | PRN
  Administered 2014-10-08 (×2): via INTRAVENOUS

## 2014-10-08 MED ORDER — SUCCINYLCHOLINE CHLORIDE 20 MG/ML IJ SOLN
INTRAMUSCULAR | Status: AC
  Filled 2014-10-08: qty 1

## 2014-10-08 MED ORDER — BUPROPION HCL ER (XL) 150 MG PO TB24
300.0000 mg | ORAL_TABLET | Freq: Every day | ORAL | Status: DC
  Administered 2014-10-09: 300 mg via ORAL
  Filled 2014-10-08: qty 2

## 2014-10-08 MED ORDER — FENTANYL CITRATE 0.05 MG/ML IJ SOLN
INTRAMUSCULAR | Status: DC | PRN
  Administered 2014-10-08 (×2): 50 ug via INTRAVENOUS

## 2014-10-08 MED ORDER — ROCURONIUM BROMIDE 100 MG/10ML IV SOLN
INTRAVENOUS | Status: DC | PRN
  Administered 2014-10-08: 50 mg via INTRAVENOUS

## 2014-10-08 MED ORDER — BISACODYL 5 MG PO TBEC: 5.0000 mg | DELAYED_RELEASE_TABLET | Freq: Every day | ORAL | Status: DC | PRN

## 2014-10-08 MED ORDER — ACETAMINOPHEN 650 MG RE SUPP: 650.0000 mg | Freq: Four times a day (QID) | RECTAL | Status: DC | PRN

## 2014-10-08 MED ORDER — ONDANSETRON HCL 4 MG/2ML IJ SOLN: 4.0000 mg | Freq: Four times a day (QID) | INTRAMUSCULAR | Status: DC | PRN

## 2014-10-08 MED ORDER — METOCLOPRAMIDE HCL 5 MG/ML IJ SOLN: 5.0000 mg | Freq: Three times a day (TID) | INTRAMUSCULAR | Status: DC | PRN

## 2014-10-08 MED ORDER — SODIUM CHLORIDE 0.9 % IR SOLN
Status: DC | PRN
  Administered 2014-10-08: 1000 mL

## 2014-10-08 MED ORDER — PHENYLEPHRINE 40 MCG/ML (10ML) SYRINGE FOR IV PUSH (FOR BLOOD PRESSURE SUPPORT)
PREFILLED_SYRINGE | INTRAVENOUS | Status: AC
  Filled 2014-10-08: qty 30

## 2014-10-08 MED ORDER — ARTIFICIAL TEARS OP OINT
TOPICAL_OINTMENT | OPHTHALMIC | Status: AC
  Filled 2014-10-08: qty 3.5

## 2014-10-08 MED ORDER — LACTATED RINGERS IV SOLN
INTRAVENOUS | Status: DC
  Administered 2014-10-08: 19:00:00 via INTRAVENOUS

## 2014-10-08 MED ORDER — ONDANSETRON HCL 4 MG/2ML IJ SOLN
INTRAMUSCULAR | Status: DC | PRN
  Administered 2014-10-08: 4 mg via INTRAVENOUS

## 2014-10-08 MED ORDER — PROPOFOL 10 MG/ML IV BOLUS
INTRAVENOUS | Status: AC
  Filled 2014-10-08: qty 20

## 2014-10-08 MED ORDER — ONDANSETRON HCL 4 MG PO TABS: 4.0000 mg | ORAL_TABLET | Freq: Four times a day (QID) | ORAL | Status: DC | PRN

## 2014-10-08 MED ORDER — VECURONIUM BROMIDE 10 MG IV SOLR
INTRAVENOUS | Status: AC
  Filled 2014-10-08: qty 10

## 2014-10-08 MED ORDER — ONDANSETRON HCL 4 MG/2ML IJ SOLN: 4.0000 mg | Freq: Once | INTRAMUSCULAR | Status: DC | PRN

## 2014-10-08 MED ORDER — DEXTROSE 5 % IV SOLN
10.0000 mg | INTRAVENOUS | Status: DC | PRN
  Administered 2014-10-08: 10 ug/min via INTRAVENOUS

## 2014-10-08 MED ORDER — LIDOCAINE HCL (CARDIAC) 20 MG/ML IV SOLN
INTRAVENOUS | Status: AC
  Filled 2014-10-08: qty 5

## 2014-10-08 MED ORDER — LISINOPRIL 20 MG PO TABS
20.0000 mg | ORAL_TABLET | Freq: Every day | ORAL | Status: DC
  Administered 2014-10-09: 20 mg via ORAL
  Filled 2014-10-08: qty 1

## 2014-10-08 MED ORDER — METOCLOPRAMIDE HCL 5 MG PO TABS: 5.0000 mg | ORAL_TABLET | Freq: Three times a day (TID) | ORAL | Status: DC | PRN

## 2014-10-08 MED ORDER — HYDROMORPHONE HCL 1 MG/ML IJ SOLN
0.5000 mg | INTRAMUSCULAR | Status: DC | PRN
  Administered 2014-10-08: 1 mg via INTRAVENOUS
  Filled 2014-10-08: qty 1

## 2014-10-08 MED ORDER — MIDAZOLAM HCL 5 MG/5ML IJ SOLN
INTRAMUSCULAR | Status: DC | PRN
  Administered 2014-10-08: 2 mg via INTRAVENOUS

## 2014-10-08 MED ORDER — OXYCODONE HCL 5 MG PO TABS
5.0000 mg | ORAL_TABLET | ORAL | Status: DC | PRN
  Administered 2014-10-09 (×4): 10 mg via ORAL
  Filled 2014-10-08 (×3): qty 2

## 2014-10-08 MED ORDER — MIDAZOLAM HCL 2 MG/2ML IJ SOLN
INTRAMUSCULAR | Status: AC
  Filled 2014-10-08: qty 2

## 2014-10-08 MED ORDER — SODIUM CHLORIDE 0.9 % IJ SOLN
INTRAMUSCULAR | Status: AC
Start: 2014-10-08 — End: 2014-10-08
  Filled 2014-10-08: qty 20

## 2014-10-08 MED ORDER — HYDROMORPHONE HCL 1 MG/ML IJ SOLN: 0.2500 mg | INTRAMUSCULAR | Status: DC | PRN

## 2014-10-08 MED ORDER — PROPOFOL 10 MG/ML IV BOLUS
INTRAVENOUS | Status: DC | PRN
  Administered 2014-10-08: 150 mg via INTRAVENOUS

## 2014-10-08 MED ORDER — STERILE WATER FOR INJECTION IJ SOLN
INTRAMUSCULAR | Status: AC
  Filled 2014-10-08: qty 10

## 2014-10-08 MED ORDER — FENTANYL CITRATE 0.05 MG/ML IJ SOLN
INTRAMUSCULAR | Status: AC
  Filled 2014-10-08: qty 5

## 2014-10-08 MED ORDER — CEFAZOLIN SODIUM-DEXTROSE 2-3 GM-% IV SOLR
2.0000 g | Freq: Four times a day (QID) | INTRAVENOUS | Status: AC
  Administered 2014-10-08 – 2014-10-09 (×3): 2 g via INTRAVENOUS
  Filled 2014-10-08 (×3): qty 50

## 2014-10-08 MED ORDER — ONDANSETRON HCL 4 MG/2ML IJ SOLN
INTRAMUSCULAR | Status: AC
  Filled 2014-10-08: qty 2

## 2014-10-08 MED ORDER — POLYETHYLENE GLYCOL 3350 17 G PO PACK: 17.0000 g | PACK | Freq: Every day | ORAL | Status: DC | PRN

## 2014-10-08 MED ORDER — MENTHOL 3 MG MT LOZG: 1.0000 | LOZENGE | OROMUCOSAL | Status: DC | PRN

## 2014-10-08 MED ORDER — PHENOL 1.4 % MT LIQD: 1.0000 | OROMUCOSAL | Status: DC | PRN

## 2014-10-08 MED ORDER — ACETAMINOPHEN 325 MG PO TABS: 650.0000 mg | ORAL_TABLET | Freq: Four times a day (QID) | ORAL | Status: DC | PRN

## 2014-10-08 MED ORDER — KETOROLAC TROMETHAMINE 15 MG/ML IJ SOLN
7.5000 mg | Freq: Four times a day (QID) | INTRAMUSCULAR | Status: AC
  Administered 2014-10-08 – 2014-10-09 (×4): 7.5 mg via INTRAVENOUS
  Filled 2014-10-08 (×4): qty 1

## 2014-10-08 MED ORDER — LIDOCAINE HCL (CARDIAC) 20 MG/ML IV SOLN
INTRAVENOUS | Status: DC | PRN
  Administered 2014-10-08: 75 mg via INTRAVENOUS

## 2014-10-08 MED ORDER — EPHEDRINE SULFATE 50 MG/ML IJ SOLN
INTRAMUSCULAR | Status: AC
  Filled 2014-10-08: qty 2

## 2014-10-08 MED ORDER — SUGAMMADEX SODIUM 200 MG/2ML IV SOLN
INTRAVENOUS | Status: AC
Start: 2014-10-08 — End: 2014-10-08
  Filled 2014-10-08: qty 2

## 2014-10-08 SURGICAL SUPPLY — 64 items
BLADE SAW SGTL 83.5X18.5 (BLADE) ×3 IMPLANT
CAPT SHLDR TOTAL 2 ×3 IMPLANT
CEMENT HV SMART SET (Cement) ×3 IMPLANT
COVER SURGICAL LIGHT HANDLE (MISCELLANEOUS) ×3 IMPLANT
DERMABOND ADHESIVE PROPEN (GAUZE/BANDAGES/DRESSINGS) ×2
DERMABOND ADVANCED (GAUZE/BANDAGES/DRESSINGS) ×2
DERMABOND ADVANCED .7 DNX12 (GAUZE/BANDAGES/DRESSINGS) ×1 IMPLANT
DERMABOND ADVANCED .7 DNX6 (GAUZE/BANDAGES/DRESSINGS) ×1 IMPLANT
DRAPE IMP U-DRAPE 54X76 (DRAPES) ×3 IMPLANT
DRAPE INCISE IOBAN 66X45 STRL (DRAPES) ×3 IMPLANT
DRAPE ORTHO SPLIT 77X108 STRL (DRAPES) ×4
DRAPE SURG 17X11 SM STRL (DRAPES) ×3 IMPLANT
DRAPE SURG ORHT 6 SPLT 77X108 (DRAPES) ×2 IMPLANT
DRAPE U-SHAPE 47X51 STRL (DRAPES) ×3 IMPLANT
DRESSING AQUACEL AQ EXTRA 4X5 (GAUZE/BANDAGES/DRESSINGS) ×3 IMPLANT
DRILL BIT 7/64X5 (BIT) ×3 IMPLANT
DRSG AQUACEL AG ADV 3.5X10 (GAUZE/BANDAGES/DRESSINGS) ×3 IMPLANT
DURAPREP 26ML APPLICATOR (WOUND CARE) ×6 IMPLANT
ELECT BLADE 4.0 EZ CLEAN MEGAD (MISCELLANEOUS) ×3
ELECT CAUTERY BLADE 6.4 (BLADE) ×3 IMPLANT
ELECT REM PT RETURN 9FT ADLT (ELECTROSURGICAL) ×3
ELECTRODE BLDE 4.0 EZ CLN MEGD (MISCELLANEOUS) ×1 IMPLANT
ELECTRODE REM PT RTRN 9FT ADLT (ELECTROSURGICAL) ×1 IMPLANT
FACESHIELD STD STERILE (MASK) ×6 IMPLANT
FACESHIELD WRAPAROUND (MASK) ×3 IMPLANT
GLOVE BIO SURGEON STRL SZ7.5 (GLOVE) ×3 IMPLANT
GLOVE BIO SURGEON STRL SZ8 (GLOVE) ×3 IMPLANT
GLOVE EUDERMIC 7 POWDERFREE (GLOVE) ×3 IMPLANT
GLOVE SS BIOGEL STRL SZ 7.5 (GLOVE) ×1 IMPLANT
GLOVE SUPERSENSE BIOGEL SZ 7.5 (GLOVE) ×2
GOWN STRL REUS W/ TWL LRG LVL3 (GOWN DISPOSABLE) ×1 IMPLANT
GOWN STRL REUS W/ TWL XL LVL3 (GOWN DISPOSABLE) ×2 IMPLANT
GOWN STRL REUS W/TWL LRG LVL3 (GOWN DISPOSABLE) ×2
GOWN STRL REUS W/TWL XL LVL3 (GOWN DISPOSABLE) ×4
KIT BASIN OR (CUSTOM PROCEDURE TRAY) ×3 IMPLANT
KIT ROOM TURNOVER OR (KITS) ×3 IMPLANT
MANIFOLD NEPTUNE II (INSTRUMENTS) ×3 IMPLANT
NDL SUT 6 .5 CRC .975X.05 MAYO (NEEDLE) ×1 IMPLANT
NEEDLE HYPO 25GX1X1/2 BEV (NEEDLE) IMPLANT
NEEDLE MAYO TAPER (NEEDLE) ×2
NS IRRIG 1000ML POUR BTL (IV SOLUTION) ×3 IMPLANT
PACK SHOULDER (CUSTOM PROCEDURE TRAY) ×3 IMPLANT
PAD ARMBOARD 7.5X6 YLW CONV (MISCELLANEOUS) ×6 IMPLANT
PASSER SUT SWANSON 36MM LOOP (INSTRUMENTS) IMPLANT
PIN METAGLENE 2.5 (PIN) IMPLANT
SLING ARM LRG ADULT FOAM STRAP (SOFTGOODS) IMPLANT
SLING ARM XL FOAM STRAP (SOFTGOODS) ×3 IMPLANT
SMARTMIX MINI TOWER (MISCELLANEOUS) ×3
SPONGE LAP 18X18 X RAY DECT (DISPOSABLE) ×3 IMPLANT
SPONGE LAP 4X18 X RAY DECT (DISPOSABLE) ×3 IMPLANT
SUCTION FRAZIER TIP 10 FR DISP (SUCTIONS) ×3 IMPLANT
SUT BONE WAX W31G (SUTURE) IMPLANT
SUT FIBERWIRE #2 38 T-5 BLUE (SUTURE) ×6
SUT MNCRL AB 3-0 PS2 18 (SUTURE) ×3 IMPLANT
SUT VIC AB 1 CT1 27 (SUTURE) ×6
SUT VIC AB 1 CT1 27XBRD ANBCTR (SUTURE) ×3 IMPLANT
SUT VIC AB 2-0 CT1 27 (SUTURE) ×4
SUT VIC AB 2-0 CT1 TAPERPNT 27 (SUTURE) ×2 IMPLANT
SUTURE FIBERWR #2 38 T-5 BLUE (SUTURE) ×2 IMPLANT
SYR CONTROL 10ML LL (SYRINGE) IMPLANT
TOWEL OR 17X24 6PK STRL BLUE (TOWEL DISPOSABLE) ×3 IMPLANT
TOWEL OR 17X26 10 PK STRL BLUE (TOWEL DISPOSABLE) ×3 IMPLANT
TOWER SMARTMIX MINI (MISCELLANEOUS) ×1 IMPLANT
WATER STERILE IRR 1000ML POUR (IV SOLUTION) ×3 IMPLANT

## 2014-10-08 NOTE — Discharge Instructions (Signed)
° °  Kevin M. Supple, M.D., F.A.A.O.S. °Orthopaedic Surgery °Specializing in Arthroscopic and Reconstructive °Surgery of the Shoulder and Knee °[phone] °[address] - [place], [place] [address] - Fax [phone] ° ° °POST-OP TOTAL SHOULDER REPLACEMENT/SHOULDER HEMIARTHROPLASTY INSTRUCTIONS ° °1. Call the office at [phone] to schedule your first post-op appointment 10-14 days from the date of your surgery. ° °2. The bandage over your incision is waterproof. You may begin showering with this dressing on. You may leave this dressing on until first follow up appointment within 2 weeks. If you would like to remove it you may do so after the 5th day. Go slow and tug at the borders gently to break the bond the dressing has with the skin. The steri strips may come off with the dressing. At this point if there is no drainage it is okay to go without a bandage or you may cover it with a light guaze and tape. Leave the steri-strips in place over your incision. You can expect drainage that is bloody or yellow in nature that should gradually decrease from day of surgery. Change your dressing daily until drainage is completely resolved, then you may feel free to go without a bandage. You can also expect significant bruising around your shoulder that will drift down your arm and into your chest wall. This is very normal and should resolve over several days. ° ° 3. Wear your sling/immobilizer at all times except to perform the exercises below or to occasionally let your arm dangle by your side to stretch your elbow. You also need to sleep in your sling immobilizer until instructed otherwise. ° °4. Range of motion to your elbow, wrist, and hand are encouraged 3-5 times daily. Exercise to your hand and fingers helps to reduce swelling you may experience. ° °5. Utilize ice to the shoulder 3-5 times minimum a day and additionally if you are experiencing pain. ° °6. Prescriptions for a pain medication and a muscle  relaxant are provided for you. It is recommended that if you are experiencing pain that you pain medication alone is not controlling, add the muscle relaxant along with the pain medication which can give additional pain relief. The first 1-2 days is generally the most severe of your pain and then should gradually decrease. As your pain lessens it is recommended that you decrease your use of the pain medications to an "as needed basis'" only and to always comply with the recommended dosages of the pain medications. ° °7. Pain medications can produce constipation along with their use. If you experience this, the use of an over the counter stool softener or laxative daily is recommended.  ° °8. For most patients, if insurance allows, home health services to include therapy has been arranged. ° °9. For additional questions or concerns, please do not hesitate to call the office. If after hours there is an answering service to forward your concerns to the physician on call. ° °POST-OP EXERCISES ° °Pendulum Exercises ° °Perform pendulum exercises while standing and bending at the waist. Support your uninvolved arm on a table or chair and allow your operated arm to hang freely. Make sure to do these exercises passively - not using you shoulder muscles. ° °Repeat 20 times. Do 3 sessions per day. ° ° ° ° °

## 2014-10-08 NOTE — Anesthesia Postprocedure Evaluation (Signed)
  Anesthesia Post-op Note  Patient: [name]  Procedure(s) Performed: Procedure(s): LEFT TOTAL SHOULDER ARTHROPLASTY (Left)  Patient Location: PACU  Anesthesia Type:General and GA combined with regional for post-op pain  Level of Consciousness: awake, alert  and oriented  Airway and Oxygen Therapy: Patient Spontanous Breathing and Patient connected to nasal cannula oxygen  Post-op Pain: mild  Post-op Assessment: Post-op Vital signs reviewed, Patient's Cardiovascular Status Stable, No signs of Nausea or vomiting and Pain level controlled  Post-op Vital Signs: stable  Last Vitals:  Filed Vitals:   10/08/14 1110  BP: 134/71  Pulse: 62  Temp: 36.4 C  Resp: 16    Complications: No apparent anesthesia complications

## 2014-10-08 NOTE — Plan of Care (Signed)
Problem: Consults Goal: Diagnosis - Shoulder Surgery Total Shoulder Arthroplasty Left     

## 2014-10-08 NOTE — H&P (Signed)
[name]    Chief Complaint: OA LEFT SHOULDER HPI: The patient is a 64 y.o. female with end stage left shoulder OA  Past Medical History  Diagnosis Date  . Arthritis, rheumatoid   . HTN (hypertension)   . Hyperlipidemia   . Vitamin D deficiency   . Osteoarthritis   . Obesity   . Constipation   . Constipation - functional 10/31/2013  . Diabetes mellitus     diet controlled  --NOTE FROM DR. GOLDING PT'S AIC WAS 5.5 ON 12/19/11  . Anxiety   . Headache   . Complication of anesthesia     itching after spinal with knee replacment, stop breathing at times after 2001 hip replacment surgery    Past Surgical History  Procedure Laterality Date  . Colonoscopy  10/20/02    normal rectum/left-sided diverticula  . Total hip arthroplasty  2001/2008    multiple surgeries on right hip including replacements. Two replacements and four dislocations.   . Total knee arthroplasty  2008    left  . Partial hysterectomy  1987  . Toe surgery      second toe on both feet, bunionectomy  . Total hip arthroplasty  2010    left  . Colonoscopy  07/06/2011    Procedure: COLONOSCOPY;  Surgeon: Daneil Dolin, MD;  Location: [facility];  Service: Endoscopy;  Laterality: N/A;  2:00  . Abdominal hysterectomy  1989  . Total knee arthroplasty  09/04/2011    Procedure: TOTAL KNEE ARTHROPLASTY;  Surgeon: Mauri Pole, MD;  Location: [facility] OR[facility];  Service: Orthopedics;  Laterality: Right;  . Joint replacement    . Total hip revision  12/25/2011    Procedure: TOTAL HIP REVISION;  Surgeon: Mauri Pole, MD;  Location: [facility] ORS;  Service: Orthopedics;  Laterality: Right;    Family History  Problem Relation Age of Onset  . Heart failure Mother   . Diabetes Mother   . Hypertension Mother   . Early death Father   . Pneumonia Father   . Alcohol abuse Father   . Heart disease Brother   . Hypertension Brother   . Diabetes Brother   . Hypertension Sister   . Hypertension Sister   . Sickle cell trait Brother    . GER disease Brother   . Thyroid disease Daughter   . Heart disease Maternal Aunt   . Heart disease Maternal Uncle     Social History:  reports that she has never smoked. She has never used smokeless tobacco. She reports that she does not drink alcohol or use illicit drugs.  Allergies: No Known Allergies  Medications Prior to Admission  Medication Sig Dispense Refill  . acetaminophen (TYLENOL) 500 MG tablet Take 1,000 mg by mouth every 8 (eight) hours as needed (pain).    Marland Kitchen buPROPion (WELLBUTRIN XL) 300 MG 24 hr tablet Take 300 mg by mouth daily.    . diclofenac sodium (VOLTAREN) 1 % GEL Apply 4 g topically daily.    Marland Kitchen lisinopril-hydrochlorothiazide (PRINZIDE,ZESTORETIC) 20-12.5 MG per tablet Take 1 tablet by mouth daily before breakfast.     . Multiple Vitamin (MULTIVITAMIN) tablet Take 1 tablet by mouth daily.    Marland Kitchen omega-3 acid ethyl esters (LOVAZA) 1 G capsule Take 1 g by mouth daily.     Marland Kitchen zolpidem (AMBIEN) 10 MG tablet Take 1 tablet by mouth at bedtime as needed and may repeat dose one time if needed.    . diphenhydrAMINE (BENADRYL) 25 mg capsule Take  1 capsule (25 mg total) by mouth every 6 (six) hours as needed for itching, allergies or sleep.       Physical Exam: left shoulder with painful and restricted motion as noted at recent office visits Vitals  Temp:  [97.7 F (36.5 C)] 97.7 F (36.5 C) (03/31 0557) Pulse Rate:  [76] 76 (03/31 0557) Resp:  [18] 18 (03/31 0557) BP: (126)/(77) 126/77 mmHg (03/31 0557) SpO2:  [100 %] 100 % (03/31 0557)  Assessment/Plan  Impression: OA LEFT SHOULDER  Plan of Action: Procedure(s): LEFT TOTAL SHOULDER ARTHROPLASTY  Clarkson Rosselli M 10/08/2014, 7:26 AM

## 2014-10-08 NOTE — Anesthesia Preprocedure Evaluation (Addendum)
Anesthesia Evaluation  Patient identified by MRN, date of birth, ID band Patient awake    Reviewed: Allergy & Precautions, H&P , NPO status , Patient's Chart, lab work & pertinent test results  Airway Mallampati: II  TM Distance: >3 FB Neck ROM: Full    Dental  (+) Teeth Intact, Dental Advidsory Given   Pulmonary  breath sounds clear to auscultation        Cardiovascular hypertension, On Medications Rhythm:regular Rate:Normal     Neuro/Psych  Headaches,    GI/Hepatic   Endo/Other  diabetes  Renal/GU      Musculoskeletal   Abdominal   Peds  Hematology   Anesthesia Other Findings   Reproductive/Obstetrics                            Anesthesia Physical Anesthesia Plan  ASA: II  Anesthesia Plan: General   Post-op Pain Management: MAC Combined w/ Regional for Post-op pain   Induction: Intravenous  Airway Management Planned: Oral ETT  Additional Equipment:   Intra-op Plan:   Post-operative Plan: Extubation in OR  Informed Consent: I have reviewed the patients History and Physical, chart, labs and discussed the procedure including the risks, benefits and alternatives for the proposed anesthesia with the patient or authorized representative who has indicated his/her understanding and acceptance.   Dental Advisory Given  Plan Discussed with: Anesthesiologist, CRNA and Surgeon  Anesthesia Plan Comments:        Anesthesia Quick Evaluation

## 2014-10-08 NOTE — Transfer of Care (Signed)
Immediate Anesthesia Transfer of Care Note  Patient: [name]  Procedure(s) Performed: Procedure(s): LEFT TOTAL SHOULDER ARTHROPLASTY (Left)  Patient Location: PACU  Anesthesia Type:General  Level of Consciousness: awake, alert  and oriented  Airway & Oxygen Therapy: Patient Spontanous Breathing and Patient connected to nasal cannula oxygen  Post-op Assessment: Report given to RN, Post -op Vital signs reviewed and stable and Patient moving all extremities X 4  Post vital signs: Reviewed and stable  Last Vitals:  Filed Vitals:   10/08/14 0557  BP: 126/77  Pulse: 76  Temp: 36.5 C  Resp: 18    Complications: No apparent anesthesia complications

## 2014-10-08 NOTE — Op Note (Signed)
[name], [name] NO.:  [id number]  MEDICAL RECORD NO.:  [id number]  LOCATION:  5N01C                        FACILITY:  [facility]  PHYSICIAN:  Jennifer Clines. Kaimani Clayson, M.D.  DATE OF BIRTH:  Jun 18, 1951  DATE OF PROCEDURE:  10/08/2014 DATE OF DISCHARGE:                              OPERATIVE REPORT   PREOPERATIVE DIAGNOSIS:  End-stage left shoulder osteoarthritis.  POSTOPERATIVE DIAGNOSIS:  End-stage left shoulder osteoarthritis.  PROCEDURE:  Left total shoulder arthroplasty utilizing a press-fit size 14 DePuy modular stem with a 44 x 18 standard head and a cemented pegged 44 glenoid.  SURGEON:  [name], M.D.  Jennifer DupontOlivia Mackie A. Shuford, [name].  ANESTHESIA:  General endotracheal as well as an interscalene block.  ESTIMATED BLOOD LOSS:  250 mL.  DRAINS:  None.  HISTORY:  [name] is a 64 year old female who has had a chronic and progressive increasing left shoulder pain, related to end-stage glenohumeral arthritis.  Due to deteriorating functional status and failure to respond to conservative management, she was brought to the operating room at this time for planned left total shoulder arthroplasty.  Preoperatively, I counseled [name] regarding treatment options and risks versus benefits thereof.  Possible surgical complications were all reviewed including potential for bleeding, infection, neurovascular injury, persistent pain, loss of motion, anesthetic complication, failure of the implant, and possible need for additional surgery.  She understands and accepts and agrees with our planned procedure.  DESCRIPTION OF PROCEDURE:  After undergoing routine preop evaluation, the patient received prophylactic antibiotics.  An interscalene block was established in the holding area by the Anesthesia Department. Placed supine on the operating table, underwent smooth induction of a general endotracheal anesthesia.  Placed into beach-chair position  and appropriately padded and protected.  Left shoulder girdle region was then sterilely prepped and draped in standard fashion.  Time-out was called.  An anterior deltopectoral approach to the left shoulder was made through a 10 cm incision anteriorly.  Skin flaps were elevated and electrocautery was used for hemostasis.  The deltopectoral interval was then identified.  Cephalic vein was taken laterally with the deltoid. This interval was developed bluntly from proximal to distal and the upper 1.5 cm thin pectoralis major was tenotomized to enhance exposure. Adhesions divided beneath the deltoid.  Conjoint tendon was identified, mobilized, and retracted medially.  At this point, the biceps tendon was identified, unroofed, and tenotomized for later tenodesis, and then we split the rotator cuff along the rotator interval from the apex of the bicipital groove to the base of the coracoid and separated the subscapularis away from the lesser tuberosity, leaving a 1 cm cuff of tissue for later repair.  We then divided the capsular attachments from the anteroinferior and inferior aspects of the humeral head which allowed the head to be delivered through the wound and care was taken to protect the rotator cuff superiorly and posteriorly.  Using an extramedullary guide, we then outlined a proposed humeral head resection, which we then performed with an oscillating saw.  Care was taken to protect the rotator cuff.  We then hand reamed the humeral medullary canal up to size 14.  Broached with a  size 14 and a 14 trial showed good fit and fixation.  We then removed the osteophytes and margin of the humeral head anteriorly and inferiorly.  At this point, a metal cap was placed over the cut surface of the proximal humerus.  We then exposed the glenoid with combination of Fukuda, pitchfork, and snake tongue retractors.  Proximal stump of the biceps and the glenoid labrum were all then resected with  electrocautery, care taken to confirm visualization of entire circumference of the glenoid.  Divided the capsule anteriorly and then mobilized the subscapularis, so that had completely free excursion.  Guide pin then placed into the center of the glenoid.  We reamed the glenoid with a 44 reamer to a smooth subchondral bony bed and residual peripheral bone debris with margins was then removed with rongeur and irrigated and cleared.  A central drill hole was then placed followed by the peripheral peg holes and the trial glenoid showed excellent fit.  Glenoid was then irrigated and dried.  We mixed cement in appropriate consistency.  It was introduced into the peripheral peg holes.  The final glenoid 44 was impacted into position with excellent fit and fixation.  Cement was allowed to harden.  We turned our attention back to the proximal humerus where we irrigated the canal.  Humeral stem was assembled and provisionally inserted.  We used bone graft from the humeral head to bone graft the metaphyseal region, with the stem was then inserted to appropriate depth at approximately 30 degrees of retroversion and against the native retroversion.  Excellent fixation was achieved.  We then performed some trial reductions and ultimately the 44 x 18 standard head showed the best soft tissue balance with good excursion, 50% translation of the humeral head and glenoid and excellent soft tissue balance.  The final 44 x 18 head was then impacted after the [facility] taper was cleaned and dried.  Final reduction was performed.  The joint was irrigated.  We then repaired the subscapularis back to the stump of tissue on the lesser tuberosity utilizing a series of figure-of-eight #2 FiberWire sutures.  The rotator interval was closed with FiberWire #2 and the biceps tendon was then tenodesed at the upper margin of the pectoralis major with #2 FiberWire.  The deltopectoral interval was then reapproximated with a  series of figure- of-eight #1 Vicryl suture.  2-0 Vicryl for subcu layer, intracuticular Monocryl for the skin followed by Dermabond and Aquacel dressing.  The right arm was then placed in a sling.  The patient was awakened, extubated, and taken to the recovery room in stable condition.  Jennifer Loges, [name], was used as an Environmental consultant throughout this case, essential for help with positioning the patient, positioning the extremity, management of the retractors, tissue manipulation, implantation of the prosthesis, wound closure, and intraoperative decision making.     [name], M.D.     KMS/MEDQ  D:  10/08/2014  T:  10/08/2014  Job:  [id number]

## 2014-10-08 NOTE — Anesthesia Procedure Notes (Addendum)
Procedure Name: Intubation Date/Time: 10/08/2014 7:54 AM Performed by: Neldon Newport Pre-anesthesia Checklist: Patient identified, Emergency Drugs available, Suction available, Patient being monitored and Timeout performed Patient Re-evaluated:Patient Re-evaluated prior to inductionOxygen Delivery Method: Circle system utilized Preoxygenation: Pre-oxygenation with 100% oxygen Intubation Type: IV induction Ventilation: Oral airway inserted - appropriate to patient size and Mask ventilation without difficulty Laryngoscope Size: Mac and 3 Grade View: Grade II Tube type: Oral Tube size: 7.5 mm Number of attempts: 1 Airway Equipment and Method: Stylet and Oral airway Placement Confirmation: ETT inserted through vocal cords under direct vision,  positive ETCO2,  CO2 detector and breath sounds checked- equal and bilateral Secured at: 21 cm Tube secured with: Tape Dental Injury: Teeth and Oropharynx as per pre-operative assessment  Comments: Intubation per Ainsley Spinner SRNA   Anesthesia Regional Block:  Interscalene brachial plexus block  Pre-Anesthetic Checklist: ,, timeout performed, Correct Patient, Correct Site, Correct Laterality, Correct Procedure, Correct Position, site marked, Risks and benefits discussed,  Surgical consent,  Pre-op evaluation,  At surgeon's request and post-op pain management  Laterality: Left  Prep: chloraprep       Needles:  Injection technique: Single-shot  Needle Type: Echogenic Stimulator Needle     Needle Length: 9cm 9 cm Needle Gauge: 22 and 22 G    Additional Needles:  Procedures: ultrasound guided (picture in chart) Interscalene brachial plexus block Narrative:  Start time: 10/08/2014 7:20 AM End time: 10/08/2014 7:25 AM Injection made incrementally with aspirations every 5 mL.  Performed by: Personally   Additional Notes: 30 cc 0.5% marcaine with 1:200 epi injected easily

## 2014-10-08 NOTE — Op Note (Signed)
10/08/2014  9:56 AM  PATIENT:   [name]  64 y.o. female  PRE-OPERATIVE DIAGNOSIS:  OA LEFT SHOULDER  POST-OPERATIVE DIAGNOSIS:  same  PROCEDURE:  L TSA #14 modular depuy stem, 44x18 standard head 44 glenoid  SURGEON:  Nedim Oki, Metta Clines M.D.  ASSISTANTS: Shuford pac   ANESTHESIA:   GET + ISB  EBL: 250  SPECIMEN:  none  Drains: none   PATIENT DISPOSITION:  PACU - hemodynamically stable.    PLAN OF CARE: Admit to inpatient   Dictation# [id number]

## 2014-10-09 ENCOUNTER — Encounter (HOSPITAL_COMMUNITY): Payer: Self-pay | Admitting: General Practice

## 2014-10-09 DIAGNOSIS — M19012 Primary osteoarthritis, left shoulder: Secondary | ICD-10-CM | POA: Diagnosis not present

## 2014-10-09 MED ORDER — DIAZEPAM 5 MG PO TABS: 2.5000 mg | ORAL_TABLET | Freq: Four times a day (QID) | ORAL | Status: DC | PRN

## 2014-10-09 MED ORDER — OXYCODONE-ACETAMINOPHEN 5-325 MG PO TABS: 1.0000 | ORAL_TABLET | ORAL | Status: DC | PRN

## 2014-10-09 NOTE — Discharge Summary (Signed)
PATIENT ID:      [name]  MRN:     [id number] DOB/AGE:    [birth year] / 64 y.o.     DISCHARGE SUMMARY  ADMISSION DATE:    10/08/2014 DISCHARGE DATE:    ADMISSION DIAGNOSIS: OA LEFT SHOULDER Past Medical History  Diagnosis Date  . Arthritis, rheumatoid   . HTN (hypertension)   . Hyperlipidemia   . Vitamin D deficiency   . Osteoarthritis   . Obesity   . Constipation   . Constipation - functional 10/31/2013  . Diabetes mellitus     diet controlled  --NOTE FROM DR. GOLDING PT'S AIC WAS 5.5 ON 12/19/11  . Anxiety   . Headache   . Complication of anesthesia     itching after spinal with knee replacment, stop breathing at times after 2001 hip replacment surgery    DISCHARGE DIAGNOSIS:   Active Problems:   S/P shoulder replacement   PROCEDURE: Procedure(s): LEFT TOTAL SHOULDER ARTHROPLASTY on 10/08/2014  CONSULTS:   none  HISTORY:  See H&P in chart.  HOSPITAL COURSE:  [name] is a 64 y.o. admitted on 10/08/2014 with a chief complaint of severe left shoulder pain and dysfunction, and found to have a diagnosis of OA LEFT SHOULDER.  They were brought to the operating room on 10/08/2014 and underwent Procedure(s): LEFT TOTAL SHOULDER ARTHROPLASTY.    They were given perioperative antibiotics: Anti-infectives    Start     Dose/Rate Route Frequency Ordered Stop   10/08/14 1400  ceFAZolin (ANCEF) IVPB 2 g/50 mL premix     2 g 100 mL/hr over 30 Minutes Intravenous Every 6 hours 10/08/14 1133 10/09/14 0228   10/08/14 0600  ceFAZolin (ANCEF) IVPB 2 g/50 mL premix     2 g 100 mL/hr over 30 Minutes Intravenous On call to O.R. 10/07/14 1334 10/08/14 0800    .  Patient underwent the above named procedure and tolerated it well. The following day they were hemodynamically stable and pain was controlled on oral analgesics. They were neurovascularly intact to the operative extremity. OT was ordered and worked with patient per protocol. They were medically and orthopaedically stable for  discharge on day1.   DIAGNOSTIC STUDIES:  RECENT RADIOGRAPHIC STUDIES :  Mm Digital Screening Bilateral  10/07/2014   CLINICAL DATA:  Screening.  EXAM: DIGITAL SCREENING BILATERAL MAMMOGRAM WITH CAD  COMPARISON:  Previous exam(s).  ACR Breast Density Category b: There are scattered areas of fibroglandular density.  FINDINGS: There are no findings suspicious for malignancy. Images were processed with CAD.  IMPRESSION: No mammographic evidence of malignancy. A result letter of this screening mammogram will be mailed directly to the patient.  RECOMMENDATION: Screening mammogram in one year. (Code:[id number])  BI-RADS CATEGORY  1: Negative.   Electronically Signed   By: Pamelia Hoit M.D.   On: 10/07/2014 16:59    RECENT VITAL SIGNS:  Patient Vitals for the past 24 hrs:  BP Temp Temp src Pulse Resp SpO2  10/09/14 0548 112/63 mmHg 98.8 F (37.1 C) - 76 16 100 %  10/09/14 0100 103/63 mmHg 99.2 F (37.3 C) - 77 16 99 %  10/08/14 1951 (!) 94/54 mmHg 97.6 F (36.4 C) - 72 16 100 %  10/08/14 1900 102/63 mmHg - - - - -  10/08/14 1500 (!) 99/58 mmHg - - 67 16 98 %  10/08/14 1110 134/71 mmHg 97.5 F (36.4 C) Oral 62 16 100 %  10/08/14 1055 - 97.7 F (36.5 C) - 66 17  98 %  10/08/14 1052 - - - 64 17 98 %  10/08/14 1045 - - - 61 15 99 %  10/08/14 1030 - - - 66 20 100 %  10/08/14 1018 104/76 mmHg 97.5 F (36.4 C) - 69 14 100 %  .  RECENT EKG RESULTS:    Orders placed or performed during the hospital encounter of 09/25/14  . EKG 12-Lead  . EKG 12-Lead    DISCHARGE INSTRUCTIONS:    DISCHARGE MEDICATIONS:     Medication List    TAKE these medications        acetaminophen 500 MG tablet  Commonly known as:  TYLENOL  Take 1,000 mg by mouth every 8 (eight) hours as needed (pain).     buPROPion 300 MG 24 hr tablet  Commonly known as:  WELLBUTRIN XL  Take 300 mg by mouth daily.     diazepam 5 MG tablet  Commonly known as:  VALIUM  Take 0.5-1 tablets (2.5-5 mg total) by mouth every 6 (six)  hours as needed for muscle spasms or sedation.     diclofenac sodium 1 % Gel  Commonly known as:  VOLTAREN  Apply 4 g topically daily.     lisinopril-hydrochlorothiazide 20-12.5 MG per tablet  Commonly known as:  PRINZIDE,ZESTORETIC  Take 1 tablet by mouth daily before breakfast.     multivitamin tablet  Take 1 tablet by mouth daily.     omega-3 acid ethyl esters 1 G capsule  Commonly known as:  LOVAZA  Take 1 g by mouth daily.     oxyCODONE-acetaminophen 5-325 MG per tablet  Commonly known as:  PERCOCET  Take 1-2 tablets by mouth every 4 (four) hours as needed.     zolpidem 10 MG tablet  Commonly known as:  AMBIEN  Take 1 tablet by mouth at bedtime as needed and may repeat dose one time if needed.      ASK your doctor about these medications        diphenhydrAMINE 25 mg capsule  Commonly known as:  BENADRYL  Take 1 capsule (25 mg total) by mouth every 6 (six) hours as needed for itching, allergies or sleep.        FOLLOW UP VISIT:       Follow-up Information    Follow up with Marin Shutter, MD.   Specialty:  Orthopedic Surgery   Why:  call to be seen in 10-14 days   Contact information:   [address] [place] [address] [phone]       DISCHARGE [id number]  DISPOSITION: Good  DISCHARGE CONDITION:  Festus Barren for Dr. Justice Britain 10/09/2014, 8:04 AM

## 2014-10-09 NOTE — Progress Notes (Signed)
UR completed 

## 2014-10-09 NOTE — Evaluation (Signed)
Occupational Therapy Evaluation Patient Details Name: [name] MRN: [id number] DOB: March 12, 1951 Today's Date: 10/09/2014    History of Present Illness 64 yo female s/p L TSA. PMH: RA, HTN, Osteoarthritis, anxiety, DM, R THA, L TKA,    Clinical Impression   Patient is s/p L TSA surgery resulting in functional limitations due to the deficits listed below (see OT problem list). Pt is at adequate level for dlc home with family Patient will benefit from skilled OT acutely to increase independence and safety with ADLS to allow discharge follow up to be determined by MD. Pt currently does not require therapy follow up.     Follow Up Recommendations  No OT follow up    Equipment Recommendations  None recommended by OT    Recommendations for Other Services       Precautions / Restrictions Precautions Precautions: Shoulder Type of Shoulder Precautions: L TSA- supple protocol Shoulder Interventions: At all times;Off for dressing/bathing/exercises Precaution Comments: PROM 30-60-90 pendulums Required Braces or Orthoses: Sling Restrictions Weight Bearing Restrictions: Yes LUE Weight Bearing: Non weight bearing      Mobility Bed Mobility Overal bed mobility: Needs Assistance Bed Mobility: Supine to Sit     Supine to sit: Supervision     General bed mobility comments: pt able to complete sequence  Transfers Overall transfer level: Needs assistance   Transfers: Sit to/from Stand Sit to Stand: Supervision              Balance                                            ADL Overall ADL's : Needs assistance/impaired Eating/Feeding: Supervision/ safety   Grooming: Supervision/safety   Upper Body Bathing: Minimal assitance       Upper Body Dressing : Minimal assistance       Toilet Transfer: Supervision/safety           Functional mobility during ADLs: Supervision/safety General ADL Comments: pt will have A from sister upon d/c home.  Pt able to don doff sling MOD I this session. pt able to apply ice to shoulder and position self in chair correctly. pt plans to sleep in recliner and has cryo-freeze shoulder machine at home. Pt able to return demonstrate all exercises without handout . handouts provided as reference     Vision     Perception     Praxis      Pertinent Vitals/Pain Pain Assessment: 0-10 Pain Score: 2  Pain Location: L shoulder Pain Descriptors / Indicators: Sore Pain Intervention(s): Monitored during session     Hand Dominance Right   Extremity/Trunk Assessment Upper Extremity Assessment Upper Extremity Assessment: LUE deficits/detail LUE Deficits / Details: s/p surg- PROM only   Lower Extremity Assessment Lower Extremity Assessment: Overall WFL for tasks assessed   Cervical / Trunk Assessment Cervical / Trunk Assessment: Normal   Communication Communication Communication: No difficulties   Cognition Arousal/Alertness: Awake/alert Behavior During Therapy: WFL for tasks assessed/performed Overall Cognitive Status: Within Functional Limits for tasks assessed                     General Comments       Exercises Exercises: Shoulder     Shoulder Instructions Shoulder Instructions Donning/doffing shirt without moving shoulder: Minimal assistance Method for sponge bathing under operated UE: Minimal assistance Donning/doffing sling/immobilizer: Modified independent Correct positioning  of sling/immobilizer: Modified independent Pendulum exercises (written home exercise program): Supervision/safety ROM for elbow, wrist and digits of operated UE: Supervision/safety Sling wearing schedule (on at all times/off for ADL's): Supervision/safety Positioning of UE while sleeping: [place] expects to be discharged to:: Private residence Living Arrangements: Alone Available Help at Discharge: Family;Available PRN/intermittently Type of Home:  House Home Access: Stairs to enter [employer] of Steps: 1   Home Layout: One level     Bathroom Shower/Tub: Walk-in Hydrologist: Standard     Home Equipment: Bedside commode;Walker - 2 wheels          Prior Functioning/Environment Level of Independence: Independent             OT Diagnosis: Generalized weakness;Acute pain   OT Problem List:     OT Treatment/Interventions:      OT Goals(Current goals can be found in the care plan section)    OT Frequency:     Barriers to D/C:            Co-evaluation              End of Session Nurse Communication: Mobility status;Precautions  Activity Tolerance: Patient tolerated treatment well Patient left: in chair;with call bell/phone within reach   Time: 9741-6384 OT Time Calculation (min): 43 min Charges:  OT General Charges $OT Visit: 1 Procedure OT Evaluation $Initial OT Evaluation Tier I: 1 Procedure OT Treatments $Self Care/Home Management : 23-37 mins G-Codes: OT G-codes **NOT FOR INPATIENT CLASS** Functional Assessment Tool Used: clinical judgement Functional Limitation: Self care Self Care Current Status (T3646): At least 1 percent but less than 20 percent impaired, limited or restricted Self Care Goal Status (O0321): At least 1 percent but less than 20 percent impaired, limited or restricted Self Care Discharge Status [phone]): At least 1 percent but less than 20 percent impaired, limited or restricted  Peri Maris 10/09/2014, 7:57 AM Pager: [phone]

## 2014-10-12 ENCOUNTER — Encounter (HOSPITAL_COMMUNITY): Payer: Self-pay | Admitting: Orthopedic Surgery

## 2014-10-12 NOTE — Care Management Note (Signed)
CARE MANAGEMENT NOTE 10/12/2014  Patient:  [name], [name]   Account Number:  [id number]  Date Initiated:  10/09/2014  Documentation initiated by:  Ricki Miller  Subjective/Objective Assessment:   64 yr old female admitted with osteoarthritis of her left shoulder. patient underwent a left total shoulder arthroplasty.     Action/Plan:   No home health or DME needs Identified.   Anticipated DC Date:  10/09/2014   Anticipated DC Plan:  HOME/SELF CARE      DC Planning Services  CM consult      Choice offered to / List presented to:     DME arranged  NA        HH arranged  NA      Status of service:  Completed, signed off Medicare Important Message given?   (If response is "NO", the following Medicare IM given date fields will be blank) Date Medicare IM given:   Medicare IM given by:   Date Additional Medicare IM given:   Additional Medicare IM given by:    Discharge Disposition:  HOME/SELF CARE

## 2014-10-21 NOTE — Addendum Note (Signed)
Addendum  created 10/21/14 1604 by Roberts Gaudy, MD   Modules edited: Anesthesia Responsible Staff

## 2014-11-04 ENCOUNTER — Encounter: Payer: Self-pay | Admitting: Adult Health

## 2014-11-04 ENCOUNTER — Ambulatory Visit (INDEPENDENT_AMBULATORY_CARE_PROVIDER_SITE_OTHER): Payer: Managed Care, Other (non HMO) | Admitting: Adult Health

## 2014-11-04 VITALS — BP 116/82 | HR 84 | Ht 61.0 in | Wt 220.0 lb

## 2014-11-04 DIAGNOSIS — N3941 Urge incontinence: Secondary | ICD-10-CM

## 2014-11-04 DIAGNOSIS — Z01419 Encounter for gynecological examination (general) (routine) without abnormal findings: Secondary | ICD-10-CM | POA: Diagnosis not present

## 2014-11-04 DIAGNOSIS — Z1212 Encounter for screening for malignant neoplasm of rectum: Secondary | ICD-10-CM

## 2014-11-04 HISTORY — DX: Urge incontinence: N39.41

## 2014-11-04 LAB — HEMOCCULT GUIAC POC 1CARD (OFFICE): FECAL OCCULT BLD: NEGATIVE

## 2014-11-04 NOTE — Progress Notes (Signed)
Patient ID: [name], female   DOB: [birth year], 64 y.o.   MRN: [id number] History of Present Illness: [name] is a 64 year old black female in for a well woman gyn exam.She complains of leaking urine when has to go.Has been doing a few kegels.Recently had left shoulder replacement and is going to PT.   Current Medications, Allergies, Past Medical History, Past Surgical History, Family History and Social History were reviewed in [employer] record.     Review of Systems: Patient denies any headaches, hearing loss, fatigue, blurred vision, shortness of breath, chest pain, abdominal pain, problems with bowel movements, or intercourse. No joint swelling or mood swings. +urge incontinence.Is going to try whole 30.   Physical Exam:BP 116/82 mmHg  Pulse 84  Ht 5\' 1"  (1.549 m)  Wt 220 lb (99.791 kg)  BMI 41.59 kg/m2 General:  Well developed, well nourished, no acute distress Skin:  Warm and dry Neck:  Midline trachea, normal thyroid, good ROM, no lymphadenopathy, no carotid bruits heard Lungs; Clear to auscultation bilaterally Breast:  No dominant palpable mass, retraction, or nipple discharge Cardiovascular: Regular rate and rhythm Abdomen:  Soft, non tender, no hepatosplenomegaly Pelvic:  External genitalia is normal in appearance, no lesions.  The vagina has decreased color, moisture and rugae. Urethra has no lesions or masses. The cervix and uterus are absent. No adnexal masses or tenderness noted.Bladder is non tender, no masses felt. Rectal: Good sphincter tone, no polyps, or hemorrhoids felt.  Hemoccult negative. Extremities/musculoskeletal:  No swelling or varicosities noted, no clubbing or cyanosis, healing incision left shoulder Psych:  No mood changes, alert and cooperative,seems happy   Impression: Well woman gyn exam no pap Urge incontinence     Plan: Try to void every 2-3 hours Do kegels but increase number, and let me know,weight loss will  help Physical in 1 year Mammogram yearly Colonoscopy 2017 Labs with PCP

## 2014-11-04 NOTE — Patient Instructions (Signed)
Urinary Incontinence Urinary incontinence is the involuntary loss of urine from your bladder. CAUSES  There are many causes of urinary incontinence. They include:  Medicines.  Infections.  Prostatic enlargement, leading to overflow of urine from your bladder.  Surgery.  Neurological diseases.  Emotional factors. SIGNS AND SYMPTOMS Urinary Incontinence can be divided into four types: 1. Urge incontinence. Urge incontinence is the involuntary loss of urine before you have the opportunity to go to the bathroom. There is a sudden urge to void but not enough time to reach a bathroom. 2. Stress incontinence. Stress incontinence is the sudden loss of urine with any activity that forces urine to pass. It is commonly caused by anatomical changes to the pelvis and sphincter areas of your body. 3. Overflow incontinence. Overflow incontinence is the loss of urine from an obstructed opening to your bladder. This results in a backup of urine and a resultant buildup of pressure within the bladder. When the pressure within the bladder exceeds the closing pressure of the sphincter, the urine overflows, which causes incontinence, similar to water overflowing a dam. 4. Total incontinence. Total incontinence is the loss of urine as a result of the inability to store urine within your bladder. DIAGNOSIS  Evaluating the cause of incontinence may require:  A thorough and complete medical and obstetric history.  A complete physical exam.  Laboratory tests such as a urine culture and sensitivities. When additional tests are indicated, they can include:  An ultrasound exam.  Kidney and bladder X-rays.  Cystoscopy. This is an exam of the bladder using a narrow scope.  Urodynamic testing to test the nerve function to the bladder and sphincter areas. TREATMENT  Treatment for urinary incontinence depends on the cause:  For urge incontinence caused by a bacterial infection, antibiotics will be prescribed.  If the urge incontinence is related to medicines you take, your health care provider may have you change the medicine.  For stress incontinence, surgery to re-establish anatomical support to the bladder or sphincter, or both, will often correct the condition.  For overflow incontinence caused by an enlarged prostate, an operation to open the channel through the enlarged prostate will allow the flow of urine out of the bladder. In women with fibroids, a hysterectomy may be recommended.  For total incontinence, surgery on your urinary sphincter may help. An artificial urinary sphincter (an inflatable cuff placed around the urethra) may be required. In women who have developed a hole-like passage between their bladder and vagina (vesicovaginal fistula), surgery to close the fistula often is required. HOME CARE INSTRUCTIONS  Normal daily hygiene and the use of pads or adult diapers that are changed regularly will help prevent odors and skin damage.  Avoid caffeine. It can overstimulate your bladder.  Use the bathroom regularly. Try about every 2-3 hours to go to the bathroom, even if you do not feel the need to do so. Take time to empty your bladder completely. After urinating, wait a minute. Then try to urinate again.  For causes involving nerve dysfunction, keep a log of the medicines you take and a journal of the times you go to the bathroom. SEEK MEDICAL CARE IF:  You experience worsening of pain instead of improvement in pain after your procedure.  Your incontinence becomes worse instead of better. SEE IMMEDIATE MEDICAL CARE IF:  You experience fever or shaking chills.  You are unable to pass your urine.  You have redness spreading into your groin or down into your thighs. MAKE SURE   YOU:   Understand these instructions.   Will watch your condition.  Will get help right away if you are not doing well or get worse. Document Released: 08/03/2004 Document Revised: 04/16/2013 Document  Reviewed: 12/03/2012 [facility] Patient Information 2015 [place], [place]. This information is not intended to replace advice given to you by your health care provider. Make sure you discuss any questions you have with your health care provider. Pee every 2-3 hours Do kegels Physical in 1 year Mammogram yearly Labs with PCP Colonoscopy 2017

## 2015-04-23 ENCOUNTER — Ambulatory Visit: Payer: Managed Care, Other (non HMO) | Admitting: Adult Health

## 2015-05-21 ENCOUNTER — Encounter: Payer: Self-pay | Admitting: Adult Health

## 2015-05-21 ENCOUNTER — Ambulatory Visit (INDEPENDENT_AMBULATORY_CARE_PROVIDER_SITE_OTHER): Payer: Managed Care, Other (non HMO) | Admitting: Adult Health

## 2015-05-21 VITALS — BP 100/62 | HR 58 | Ht 61.0 in | Wt 224.0 lb

## 2015-05-21 DIAGNOSIS — N3941 Urge incontinence: Secondary | ICD-10-CM | POA: Diagnosis not present

## 2015-05-21 MED ORDER — MIRABEGRON ER 25 MG PO TB24: 25.0000 mg | ORAL_TABLET | Freq: Every day | ORAL | Status: DC

## 2015-05-21 NOTE — Progress Notes (Signed)
Subjective:     Patient ID: [name], female   DOB: May 17, 1951, 64 y.o.   MRN: [id number]  HPI Cordella is a 64 year old black female in complaining of continued UI even with increased kegels and trying to lose weight.  Review of Systems +urge incontinence,all other systems negative Reviewed past medical,surgical, social and family history. Reviewed medications and allergies.     Objective:   Physical Exam BP 100/62 mmHg  Pulse 58  Ht 5\' 1"  (1.549 m)  Wt 224 lb (101.606 kg)  BMI 42.35 kg/m2   Skin warm and dry.Pelvic: external genitalia is normal in appearance no lesions, vagina: has decreased color, moisture and rugae and some relaxation,urethra has no lesions or masses noted, cervix and uterus are absent, adnexa: no masses or tenderness noted. Bladder is non tender and no masses felt.  Assessment:     Urge incontinence     Plan:     Rx myrbetriq 25 mg #30 take 1 daily with 3 refills Follow up in 2 months,if not better will refer to urologist  Review handout on UI

## 2015-05-21 NOTE — Patient Instructions (Signed)
Urinary Incontinence Urinary incontinence is the involuntary loss of urine from your bladder. CAUSES  There are many causes of urinary incontinence. They include:  Medicines.  Infections.  Prostatic enlargement, leading to overflow of urine from your bladder.  Surgery.  Neurological diseases.  Emotional factors. SIGNS AND SYMPTOMS Urinary Incontinence can be divided into four types: 1. Urge incontinence. Urge incontinence is the involuntary loss of urine before you have the opportunity to go to the bathroom. There is a sudden urge to void but not enough time to reach a bathroom. 2. Stress incontinence. Stress incontinence is the sudden loss of urine with any activity that forces urine to pass. It is commonly caused by anatomical changes to the pelvis and sphincter areas of your body. 3. Overflow incontinence. Overflow incontinence is the loss of urine from an obstructed opening to your bladder. This results in a backup of urine and a resultant buildup of pressure within the bladder. When the pressure within the bladder exceeds the closing pressure of the sphincter, the urine overflows, which causes incontinence, similar to water overflowing a dam. 4. Total incontinence. Total incontinence is the loss of urine as a result of the inability to store urine within your bladder. DIAGNOSIS  Evaluating the cause of incontinence may require:  A thorough and complete medical and obstetric history.  A complete physical exam.  Laboratory tests such as a urine culture and sensitivities. When additional tests are indicated, they can include:  An ultrasound exam.  Kidney and bladder X-rays.  Cystoscopy. This is an exam of the bladder using a narrow scope.  Urodynamic testing to test the nerve function to the bladder and sphincter areas. TREATMENT  Treatment for urinary incontinence depends on the cause:  For urge incontinence caused by a bacterial infection, antibiotics will be prescribed.  If the urge incontinence is related to medicines you take, your health care provider may have you change the medicine.  For stress incontinence, surgery to re-establish anatomical support to the bladder or sphincter, or both, will often correct the condition.  For overflow incontinence caused by an enlarged prostate, an operation to open the channel through the enlarged prostate will allow the flow of urine out of the bladder. In women with fibroids, a hysterectomy may be recommended.  For total incontinence, surgery on your urinary sphincter may help. An artificial urinary sphincter (an inflatable cuff placed around the urethra) may be required. In women who have developed a hole-like passage between their bladder and vagina (vesicovaginal fistula), surgery to close the fistula often is required. HOME CARE INSTRUCTIONS  Normal daily hygiene and the use of pads or adult diapers that are changed regularly will help prevent odors and skin damage.  Avoid caffeine. It can overstimulate your bladder.  Use the bathroom regularly. Try about every 2-3 hours to go to the bathroom, even if you do not feel the need to do so. Take time to empty your bladder completely. After urinating, wait a minute. Then try to urinate again.  For causes involving nerve dysfunction, keep a log of the medicines you take and a journal of the times you go to the bathroom. SEEK MEDICAL CARE IF:  You experience worsening of pain instead of improvement in pain after your procedure.  Your incontinence becomes worse instead of better. SEE IMMEDIATE MEDICAL CARE IF:  You experience fever or shaking chills.  You are unable to pass your urine.  You have redness spreading into your groin or down into your thighs. MAKE SURE   YOU:   Understand these instructions.   Will watch your condition.  Will get help right away if you are not doing well or get worse.   This information is not intended to replace advice given to you  by your health care provider. Make sure you discuss any questions you have with your health care provider.   Document Released: 08/03/2004 Document Revised: 07/17/2014 Document Reviewed: 12/03/2012 Elsevier Interactive Patient Education 2016 [employer]. Try myrbetriq Follow up in 2 months

## 2015-07-21 ENCOUNTER — Ambulatory Visit: Payer: Managed Care, Other (non HMO) | Admitting: Adult Health

## 2015-08-31 ENCOUNTER — Emergency Department (HOSPITAL_COMMUNITY)
Admission: EM | Admit: 2015-08-31 | Discharge: 2015-08-31 | Disposition: A | Discharge status: Home / Self Care | Payer: 59 | Attending: Emergency Medicine | Admitting: Emergency Medicine

## 2015-08-31 ENCOUNTER — Encounter (HOSPITAL_COMMUNITY): Payer: Self-pay

## 2015-08-31 DIAGNOSIS — H9319 Tinnitus, unspecified ear: Secondary | ICD-10-CM | POA: Diagnosis not present

## 2015-08-31 DIAGNOSIS — R112 Nausea with vomiting, unspecified: Secondary | ICD-10-CM | POA: Diagnosis not present

## 2015-08-31 DIAGNOSIS — I10 Essential (primary) hypertension: Secondary | ICD-10-CM | POA: Insufficient documentation

## 2015-08-31 DIAGNOSIS — Z8739 Personal history of other diseases of the musculoskeletal system and connective tissue: Secondary | ICD-10-CM | POA: Insufficient documentation

## 2015-08-31 DIAGNOSIS — E119 Type 2 diabetes mellitus without complications: Secondary | ICD-10-CM | POA: Insufficient documentation

## 2015-08-31 DIAGNOSIS — H538 Other visual disturbances: Secondary | ICD-10-CM | POA: Insufficient documentation

## 2015-08-31 DIAGNOSIS — R109 Unspecified abdominal pain: Secondary | ICD-10-CM | POA: Insufficient documentation

## 2015-08-31 DIAGNOSIS — E559 Vitamin D deficiency, unspecified: Secondary | ICD-10-CM | POA: Diagnosis not present

## 2015-08-31 DIAGNOSIS — R197 Diarrhea, unspecified: Secondary | ICD-10-CM | POA: Diagnosis not present

## 2015-08-31 DIAGNOSIS — E785 Hyperlipidemia, unspecified: Secondary | ICD-10-CM | POA: Diagnosis not present

## 2015-08-31 DIAGNOSIS — E669 Obesity, unspecified: Secondary | ICD-10-CM | POA: Diagnosis not present

## 2015-08-31 DIAGNOSIS — Z79899 Other long term (current) drug therapy: Secondary | ICD-10-CM | POA: Diagnosis not present

## 2015-08-31 DIAGNOSIS — R42 Dizziness and giddiness: Secondary | ICD-10-CM | POA: Diagnosis present

## 2015-08-31 DIAGNOSIS — R51 Headache: Secondary | ICD-10-CM | POA: Diagnosis not present

## 2015-08-31 DIAGNOSIS — Z8719 Personal history of other diseases of the digestive system: Secondary | ICD-10-CM | POA: Insufficient documentation

## 2015-08-31 DIAGNOSIS — F419 Anxiety disorder, unspecified: Secondary | ICD-10-CM | POA: Insufficient documentation

## 2015-08-31 LAB — I-STAT CHEM 8, ED
BUN: 13 mg/dL (ref 6–20)
CREATININE: 0.6 mg/dL (ref 0.44–1.00)
Calcium, Ion: 1.17 mmol/L (ref 1.13–1.30)
Chloride: 101 mmol/L (ref 101–111)
GLUCOSE: 135 mg/dL — AB (ref 65–99)
HCT: 42 % (ref 36.0–46.0)
Hemoglobin: 14.3 g/dL (ref 12.0–15.0)
Potassium: 3.7 mmol/L (ref 3.5–5.1)
Sodium: 140 mmol/L (ref 135–145)
TCO2: 26 mmol/L (ref 0–100)

## 2015-08-31 MED ORDER — MECLIZINE HCL 25 MG PO TABS: 25.0000 mg | ORAL_TABLET | Freq: Three times a day (TID) | ORAL | Status: DC | PRN

## 2015-08-31 MED ORDER — MECLIZINE HCL 12.5 MG PO TABS
25.0000 mg | ORAL_TABLET | Freq: Once | ORAL | Status: AC
  Administered 2015-08-31: 25 mg via ORAL
  Filled 2015-08-31: qty 2

## 2015-08-31 NOTE — ED Notes (Signed)
Patient had nausea upon sitting and standing.

## 2015-08-31 NOTE — ED Notes (Signed)
Pt reports dizziness, n/v since last night.  Pt says dizziness worse with movement.  Pt says dizziness started before the n/v.  Pt had 1 episode of diarrhea.

## 2015-08-31 NOTE — Discharge Instructions (Signed)

## 2015-08-31 NOTE — ED Provider Notes (Signed)
CSN: [id number]     Arrival date & time 08/31/15  1147 History  By signing my name below, I, Stephania Fragmin, attest that this documentation has been prepared under the direction and in the presence of Ripley Fraise, MD. Electronically Signed: Stephania Fragmin, ED Scribe. 08/31/2015. 12:26 PM.  Chief Complaint  Patient presents with  . Dizziness   The history is provided by the patient. No language interpreter was used.    HPI Comments: AVAGAIL [name] is a 65 y.o. female with a history of hypertension, who presents to the Emergency Department complaining of constant, improving, room-spinning dizziness that began last night at 1 AM, about 11 hours ago. She complains of associated nausea, vomiting, headache, an episode of blurred vision (no diplopia), and one episode of abdominal pain accompanied by diarrhea. She also notes chronic, unchanged tinnitus. However, she states she is asymptomatic presently. She states her dizziness was worsened by movement. She reports that multiple coworkers have been sick, so she thought that she might have contracted a viral illness. She had gone to her PCP Dr. Hilma Favors, but she was referred to the ED. Patient states she takes lisinopril regularly, as well as Ambien every night. She denies illicit drug or EtOH consumption. She denies extremity weakness or numbness, slurred speech, difficulty speaking or swallowing, chest pain, SOB, dysuria, urinary frequency, or hearing changes.  Past Medical History  Diagnosis Date  . Arthritis, rheumatoid ([place])   . HTN (hypertension)   . Hyperlipidemia   . Vitamin D deficiency   . Osteoarthritis   . Obesity   . Constipation   . Constipation - functional 10/31/2013  . Diabetes mellitus     diet controlled  --NOTE FROM DR. GOLDING PT'S AIC WAS 5.5 ON 12/19/11  . Anxiety   . Headache   . Complication of anesthesia     itching after spinal with knee replacment, stop breathing at times after 2001 hip replacment surgery  . Urge incontinence  11/04/2014   Past Surgical History  Procedure Laterality Date  . Colonoscopy  10/20/02    normal rectum/left-sided diverticula  . Total hip arthroplasty  2001/2008    multiple surgeries on right hip including replacements. Two replacements and four dislocations.   . Total knee arthroplasty  2008    left  . Partial hysterectomy  1987  . Toe surgery      second toe on both feet, bunionectomy  . Total hip arthroplasty  2010    left  . Colonoscopy  07/06/2011    Procedure: COLONOSCOPY;  Surgeon: Daneil Dolin, MD;  Location: [facility];  Service: Endoscopy;  Laterality: N/A;  2:00  . Abdominal hysterectomy  1989  . Total knee arthroplasty  09/04/2011    Procedure: TOTAL KNEE ARTHROPLASTY;  Surgeon: Mauri Pole, MD;  Location: [facility];  Service: Orthopedics;  Laterality: Right;  . Joint replacement    . Total hip revision  12/25/2011    Procedure: TOTAL HIP REVISION;  Surgeon: Mauri Pole, MD;  Location: [facility];  Service: Orthopedics;  Laterality: Right;  . Total shoulder replacement Left 10/08/2014    dr supple  . Total shoulder arthroplasty Left 10/08/2014    Procedure: LEFT TOTAL SHOULDER ARTHROPLASTY;  Surgeon: Justice Britain, MD;  Location: [facility];  Service: Orthopedics;  Laterality: Left;   Family History  Problem Relation Age of Onset  . Heart failure Mother   . Diabetes Mother   . Hypertension Mother   . Early death Father   .  Pneumonia Father   . Alcohol abuse Father   . Heart disease Brother   . Hypertension Brother   . Diabetes Brother   . Hypertension Sister   . Hypertension Sister   . Sickle cell trait Brother   . GER disease Brother   . Thyroid disease Daughter   . Heart disease Maternal Aunt   . Heart disease Maternal Uncle    Social History  Substance Use Topics  . Smoking status: Never Smoker   . Smokeless tobacco: Never Used  . Alcohol Use: No   OB History    Gravida Para Term Preterm AB TAB SAB Ectopic Multiple Living   2 1   1  1   1       Review of Systems  Constitutional: Negative for fever.  HENT: Positive for tinnitus (chronic). Negative for hearing loss and trouble swallowing.   Eyes: Positive for visual disturbance (blurred vision).  Respiratory: Negative for shortness of breath.   Cardiovascular: Negative for chest pain.  Gastrointestinal: Positive for nausea, vomiting, abdominal pain and diarrhea.  Genitourinary: Negative for dysuria and frequency.  Neurological: Positive for dizziness and headaches. Negative for syncope, speech difficulty, weakness and numbness.  All other systems reviewed and are negative.   Allergies  Review of patient's allergies indicates no known allergies.  Home Medications   Prior to Admission medications   Medication Sig Start Date End Date Taking? Authorizing Provider  acetaminophen (TYLENOL) 500 MG tablet Take 1,000 mg by mouth every 8 (eight) hours as needed (pain).    Historical Provider, MD  buPROPion (WELLBUTRIN XL) 300 MG 24 hr tablet Take 300 mg by mouth daily.    Historical Provider, MD  COCONUT OIL PO Take by mouth daily.    Historical Provider, MD  lisinopril-hydrochlorothiazide (PRINZIDE,ZESTORETIC) 20-12.5 MG per tablet Take 1 tablet by mouth daily before breakfast.     Historical Provider, MD  mirabegron ER (MYRBETRIQ) 25 MG TB24 tablet Take 1 tablet (25 mg total) by mouth daily. 05/21/15   Estill Dooms, NP  Multiple Vitamin (MULTIVITAMIN) tablet Take 1 tablet by mouth daily.    Historical Provider, MD  omega-3 acid ethyl esters (LOVAZA) 1 G capsule Take 1 g by mouth daily.     Historical Provider, MD  zolpidem (AMBIEN) 10 MG tablet Take 1 tablet by mouth at bedtime as needed and may repeat dose one time if needed. 07/25/13   Historical Provider, MD   BP 126/64 mmHg  Pulse 49  Temp(Src) 97.5 F (36.4 C) (Oral)  Resp 16  Wt 225 lb (102.059 kg)  SpO2 100% Physical Exam  Nursing note and vitals reviewed. CONSTITUTIONAL: Well developed/well nourished HEAD:  Normocephalic/atraumatic EYES: EOMI/PERRL, no nystagmus, no ptosis ENMT: Mucous membranes moist; bilateral TM's clear and intact NECK: supple no meningeal signs, no bruits CV: S1/S2 noted, no murmurs/rubs/gallops noted LUNGS: Lungs are clear to auscultation bilaterally, no apparent distress ABDOMEN: soft, nontender, no rebound or guarding GU:no cva tenderness NEURO:Awake/alert, face symmetric, no arm or leg drift is noted Equal 5/5 strength with shoulder abduction, elbow flex/extension, wrist flex/extension in upper extremities and equal hand grips bilaterally Equal 5/5 strength with hip flexion,knee flex/extension, foot dorsi/plantar flexion Cranial nerves 3/4/5/6/01/15/09/11/12 tested and intact Gait normal without ataxia No past pointing Sensation to light touch intact in all extremities EXTREMITIES: pulses normal, full ROM SKIN: warm, color normal PSYCH: no abnormalities of mood noted   ED Course  Procedures   DIAGNOSTIC STUDIES: Oxygen Saturation is 100% on RA, normal by my  interpretation.    COORDINATION OF CARE: 12:13 PM - Discussed treatment plan with pt at bedside which includes blood tests, symptomatic medication, and monitoring pt's symptoms. Pt verbalized understanding and agreed to plan.  1:58 PM Pt improved Ambulatory without ataxia No dizziness reported at this time Suspect peripheral vertigo No signs of stroke She has mild sinus bradycardia but no signs of AV block She is appropriate for d/c home We discussed strict return precautions  Labs Review Labs Reviewed  I-STAT CHEM 8, ED - Abnormal; Notable for the following:    Glucose, Bld 135 (*)    All other components within normal limits    EKG Interpretation   Date/Time:  Tuesday August 31 2015 11:56:13 EST Ventricular Rate:  47 PR Interval:  198 QRS Duration: 96 QT Interval:  482 QTC Calculation: 426 R Axis:   37 Text Interpretation:  Sinus bradycardia Otherwise normal ECG Confirmed by  Christy Gentles   MD, Elenore Rota ([id number]) on 08/31/2015 12:07:47 PM      MDM   Final diagnoses:  Vertigo    Nursing notes including past medical history and social history reviewed and considered in documentation Labs/vital reviewed myself and considered during evaluation   I personally performed the services described in this documentation, which was scribed in my presence. The recorded information has been reviewed and is accurate.        Ripley Fraise, MD 08/31/15 1359

## 2015-11-16 ENCOUNTER — Other Ambulatory Visit (HOSPITAL_COMMUNITY): Payer: Self-pay | Admitting: Family Medicine

## 2015-11-16 DIAGNOSIS — Z1231 Encounter for screening mammogram for malignant neoplasm of breast: Secondary | ICD-10-CM

## 2015-11-25 ENCOUNTER — Ambulatory Visit (INDEPENDENT_AMBULATORY_CARE_PROVIDER_SITE_OTHER): Payer: 59 | Admitting: Adult Health

## 2015-11-25 ENCOUNTER — Encounter: Payer: Self-pay | Admitting: Adult Health

## 2015-11-25 VITALS — BP 100/68 | HR 60 | Ht 61.25 in | Wt 206.0 lb

## 2015-11-25 DIAGNOSIS — Z01411 Encounter for gynecological examination (general) (routine) with abnormal findings: Secondary | ICD-10-CM | POA: Diagnosis not present

## 2015-11-25 DIAGNOSIS — Z1211 Encounter for screening for malignant neoplasm of colon: Secondary | ICD-10-CM | POA: Diagnosis not present

## 2015-11-25 DIAGNOSIS — B369 Superficial mycosis, unspecified: Secondary | ICD-10-CM | POA: Diagnosis not present

## 2015-11-25 DIAGNOSIS — Z01419 Encounter for gynecological examination (general) (routine) without abnormal findings: Secondary | ICD-10-CM

## 2015-11-25 HISTORY — DX: Superficial mycosis, unspecified: B36.9

## 2015-11-25 LAB — HEMOCCULT GUIAC POC 1CARD (OFFICE): FECAL OCCULT BLD: NEGATIVE

## 2015-11-25 MED ORDER — NYSTATIN-TRIAMCINOLONE 100000-0.1 UNIT/GM-% EX CREA: 1.0000 "application " | TOPICAL_CREAM | Freq: Two times a day (BID) | CUTANEOUS | Status: DC

## 2015-11-25 MED ORDER — ACYCLOVIR 5 % EX CREA: 1.0000 "application " | TOPICAL_CREAM | CUTANEOUS | Status: DC

## 2015-11-25 NOTE — Patient Instructions (Signed)
Physical in 1 year Mammogram yearly  

## 2015-11-25 NOTE — Progress Notes (Signed)
Patient ID: [name], female   DOB: 1951/05/04, 65 y.o.   MRN: [id number] History of Present Illness: Caylei is a 65 year old black female in for a well woman gyn exam, she is sp hysterectomy.She is complaining of rash, under stomach and at top of crack, and it itches and comes and goes.She says her daughter died in 06-12-23 from CHF.  PCP is Dr Hilma Favors.   Current Medications, Allergies, Past Medical History, Past Surgical History, Family History and Social History were reviewed in [employer] record.     Review of Systems: Patient denies any headaches, hearing loss, fatigue, blurred vision, shortness of breath, chest pain, abdominal pain, problems with urination, or intercourse(not having sex). No joint pain or mood swings. Has constipation, and has lost 14 lbs in last year, she has rash under stomach and at top of crack.   Physical Exam:BP 100/68 mmHg  Pulse 60  Ht 5' 1.25" (1.556 m)  Wt 206 lb (93.441 kg)  BMI 38.59 kg/m2 General:  Well developed, well nourished, no acute distress Skin:  Warm and dry Neck:  Midline trachea, normal thyroid, good ROM, no lymphadenopathy, no carotid bruits heard Lungs; Clear to auscultation bilaterally Breast:  No dominant palpable mass, retraction, or nipple discharge Cardiovascular: Regular rate and rhythm Abdomen:  Soft, non tender, no hepatosplenomegaly, has skin fungus under panniculus and has several areas at top of crack ?herpes Pelvic:  External genitalia is normal in appearance, no lesions.  The vagina has decreased color and moisture. The cervix and uterus are absent  No adnexal masses or tenderness noted.Bladder is non tender, no masses felt. Rectal: Good sphincter tone, no polyps, or hemorrhoids felt.  Hemoccult negative. Extremities/musculoskeletal:  No swelling or varicosities noted, no clubbing or cyanosis Psych:  No mood changes, alert and cooperative,seems happy   Impression:  Well woman gyn exam no  pap Skin fungus   Plan: Check CBC,CMP,TSH and lipids, A1c and vitamin D Rx mytrex cream bid prn to stomach with 1 refill Rx zovirax cream every 3-4 hours to top oc crack with refill Physical in 1 year Mammogram in am and yearly Colonoscopy advised

## 2015-11-26 ENCOUNTER — Telehealth: Payer: Self-pay | Admitting: Adult Health

## 2015-11-26 ENCOUNTER — Ambulatory Visit (HOSPITAL_COMMUNITY)
Admission: RE | Admit: 2015-11-26 | Discharge: 2015-11-26 | Disposition: A | Discharge status: Home / Self Care | Payer: 59 | Source: Ambulatory Visit | Attending: Family Medicine | Admitting: Family Medicine

## 2015-11-26 DIAGNOSIS — Z1231 Encounter for screening mammogram for malignant neoplasm of breast: Secondary | ICD-10-CM | POA: Insufficient documentation

## 2015-11-26 LAB — COMPREHENSIVE METABOLIC PANEL
A/G RATIO: 1.5 (ref 1.2–2.2)
ALK PHOS: 75 IU/L (ref 39–117)
ALT: 16 IU/L (ref 0–32)
AST: 23 IU/L (ref 0–40)
Albumin: 4.1 g/dL (ref 3.6–4.8)
BILIRUBIN TOTAL: 0.6 mg/dL (ref 0.0–1.2)
BUN/Creatinine Ratio: 18 (ref 12–28)
BUN: 15 mg/dL (ref 8–27)
CALCIUM: 9.8 mg/dL (ref 8.7–10.3)
CHLORIDE: 96 mmol/L (ref 96–106)
CO2: 24 mmol/L (ref 18–29)
Creatinine, Ser: 0.82 mg/dL (ref 0.57–1.00)
GFR calc Af Amer: 88 mL/min/{1.73_m2} (ref >59)
GFR calc non Af Amer: 76 mL/min/{1.73_m2} (ref >59)
GLOBULIN, TOTAL: 2.8 g/dL (ref 1.5–4.5)
Glucose: 83 mg/dL (ref 65–99)
POTASSIUM: 4.2 mmol/L (ref 3.5–5.2)
SODIUM: 137 mmol/L (ref 134–144)
Total Protein: 6.9 g/dL (ref 6.0–8.5)

## 2015-11-26 LAB — TSH: TSH: 0.85 u[IU]/mL (ref 0.450–4.500)

## 2015-11-26 LAB — CBC
Hematocrit: 38.8 % (ref 34.0–46.6)
Hemoglobin: 13.1 g/dL (ref 11.1–15.9)
MCH: 28.2 pg (ref 26.6–33.0)
MCHC: 33.8 g/dL (ref 31.5–35.7)
MCV: 83 fL (ref 79–97)
PLATELETS: 284 10*3/uL (ref 150–379)
RBC: 4.65 x10E6/uL (ref 3.77–5.28)
RDW: 14.8 % (ref 12.3–15.4)
WBC: 4.3 10*3/uL (ref 3.4–10.8)

## 2015-11-26 LAB — LIPID PANEL
CHOLESTEROL TOTAL: 201 mg/dL — AB (ref 100–199)
Chol/HDL Ratio: 3.2 ratio units (ref 0.0–4.4)
HDL: 63 mg/dL (ref >39)
LDL Calculated: 127 mg/dL — ABNORMAL HIGH (ref 0–99)
TRIGLYCERIDES: 57 mg/dL (ref 0–149)
VLDL Cholesterol Cal: 11 mg/dL (ref 5–40)

## 2015-11-26 LAB — HEMOGLOBIN A1C
ESTIMATED AVERAGE GLUCOSE: 128 mg/dL
Hgb A1c MFr Bld: 6.1 % — ABNORMAL HIGH (ref 4.8–5.6)

## 2015-11-26 LAB — VITAMIN D 25 HYDROXY (VIT D DEFICIENCY, FRACTURES): Vit D, 25-Hydroxy: 37.8 ng/mL (ref 30.0–100.0)

## 2015-11-26 NOTE — Telephone Encounter (Signed)
Left message about labs, take 1000-2000 IU of vitamin D3 cut carbs and walk

## 2015-12-23 ENCOUNTER — Telehealth: Payer: Self-pay | Admitting: Internal Medicine

## 2015-12-23 NOTE — Telephone Encounter (Signed)
Pt received a letter from DS to set up her colonoscopy. She works 2nd shift and will be able via phone until 3-330pm. She can be reached at [phone]

## 2015-12-27 NOTE — Telephone Encounter (Signed)
Pt called, she is not having any problems and no family hx of colon cancer. Her last colonoscopy was by Dr. Gala Romney in 07/2011 and the next recommended in 5 years. She has a hx of adenomatous polyps.  She is aware that she is on Recall for 07/2016 and we will notify her at that time. However, she will call us if she has any problems or concerns prior to that time.  I will send her pcp a letter also.

## 2015-12-27 NOTE — Telephone Encounter (Signed)
Noted and agree. 

## 2016-03-31 ENCOUNTER — Other Ambulatory Visit: Payer: Self-pay

## 2016-03-31 ENCOUNTER — Ambulatory Visit (INDEPENDENT_AMBULATORY_CARE_PROVIDER_SITE_OTHER): Payer: 59 | Admitting: Gastroenterology

## 2016-03-31 ENCOUNTER — Encounter: Payer: Self-pay | Admitting: Gastroenterology

## 2016-03-31 VITALS — BP 113/77 | HR 62 | Temp 98.2°F | Ht 61.0 in | Wt 204.2 lb

## 2016-03-31 DIAGNOSIS — R195 Other fecal abnormalities: Secondary | ICD-10-CM | POA: Insufficient documentation

## 2016-03-31 DIAGNOSIS — R194 Change in bowel habit: Secondary | ICD-10-CM | POA: Diagnosis not present

## 2016-03-31 DIAGNOSIS — K5900 Constipation, unspecified: Secondary | ICD-10-CM

## 2016-03-31 DIAGNOSIS — Z86010 Personal history of colonic polyps: Secondary | ICD-10-CM | POA: Insufficient documentation

## 2016-03-31 MED ORDER — LINACLOTIDE 145 MCG PO CAPS: 145.0000 ug | ORAL_CAPSULE | Freq: Every day | ORAL | 3 refills | Status: DC

## 2016-03-31 MED ORDER — NA SULFATE-K SULFATE-MG SULF 17.5-3.13-1.6 GM/177ML PO SOLN: 1.0000 | ORAL | 0 refills | Status: DC

## 2016-03-31 NOTE — Patient Instructions (Signed)
1. Colonoscopy is scheduled. Please see separate instructions. 2. Samples of Linzess provided today. Please take one on an empty stomach daily for constipation. It is important to take daily for 2-3 days prior to your bowel prep to make sure you get clean enough for your procedure. We did send a prescription and as well.

## 2016-03-31 NOTE — Assessment & Plan Note (Signed)
65 year old female with history of chronic constipation, personal history of adenomatous colon polyps, family history of adenomatous colon polyps. She describes decreased stool caliber, ongoing constipation on MiraLAX. She is due for surveillance colonoscopy in 3 months. Given perceived change in bowel habits, would go ahead and pursue colonoscopy at this time. Switch MiraLAX to Linzess 151mcg daily. Samples and RX provided.  I have discussed the risks, alternatives, benefits with regards to but not limited to the risk of reaction to medication, bleeding, infection, perforation and the patient is agreeable to proceed. Written consent to be obtained.

## 2016-03-31 NOTE — Progress Notes (Signed)
Primary Care Physician:  Purvis Kilts, MD  Primary Gastroenterologist:  Garfield Cornea, MD   Chief Complaint  Patient presents with  . Constipation    small in size and shape    HPI:  [name] is a 65 y.o. female here For further evaluation of change in stool caliber. She has chronic constipation. Takes MiraLAX daily but only has a BM every other day or so. Denies melena or rectal bleeding. Over the past several months she feels like her stool caliber has become much smaller than before. She worries about this. She is due for colonoscopy in December 2017 for surveillance purposes due to adenomatous colon polyps. She denies abdominal pain. No heartburn, dysphagia, vomiting. She rarely takes Vicodin for neck pain.  FH of adenomatous colon polyps, two sisters. No FH of CRC.   Current Outpatient Prescriptions  Medication Sig Dispense Refill  . buPROPion (WELLBUTRIN XL) 300 MG 24 hr tablet Take 300 mg by mouth daily.    . COCONUT OIL PO Take by mouth.    . Flaxseed Oil OIL by Does not apply route.    Marland Kitchen HYDROcodone-acetaminophen (NORCO/VICODIN) 5-325 MG tablet Take 1 tablet by mouth 2 (two) times daily as needed for moderate pain.     Marland Kitchen ibuprofen (ADVIL,MOTRIN) 800 MG tablet Take 800 mg by mouth as needed.     Marland Kitchen lisinopril-hydrochlorothiazide (PRINZIDE,ZESTORETIC) 20-12.5 MG per tablet Take 1 tablet by mouth daily before breakfast.     . polyethylene glycol powder (MIRALAX) powder Take 1 Container by mouth daily.    Marland Kitchen zolpidem (AMBIEN) 10 MG tablet Take 1 tablet by mouth at bedtime as needed and may repeat dose one time if needed.     No current facility-administered medications for this visit.     Allergies as of 03/31/2016  . (No Known Allergies)    Past Medical History:  Diagnosis Date  . Anxiety   . Arthritis, rheumatoid ([place])   . Complication of anesthesia    itching after spinal with knee replacment, stop breathing at times after 2001 hip replacment surgery  .  Constipation   . Constipation - functional 10/31/2013  . Diabetes mellitus    diet controlled  --NOTE FROM DR. GOLDING PT'S AIC WAS 5.5 ON 12/19/11  . Headache   . HTN (hypertension)   . Hyperlipidemia   . Obesity   . Osteoarthritis   . Superficial fungus infection of skin 11/25/2015  . Urge incontinence 11/04/2014  . Vertigo   . Vitamin D deficiency     Past Surgical History:  Procedure Laterality Date  . ABDOMINAL HYSTERECTOMY  1989  . COLONOSCOPY  10/20/02   normal rectum/left-sided diverticula  . COLONOSCOPY  07/06/2011   Dr. Gala Romney: Dionisio David anal canal hemorrhoids, diminutive rectal and transverse colon polyps. 2 adenoma on path. Left-sided diverticulosis.  Marland Kitchen JOINT REPLACEMENT    . PARTIAL HYSTERECTOMY  1987  . TOE SURGERY     second toe on both feet, bunionectomy  . TOTAL HIP ARTHROPLASTY  2001/2008   multiple surgeries on right hip including replacements. Two replacements and four dislocations.   Marland Kitchen TOTAL HIP ARTHROPLASTY  2010   left  . TOTAL HIP REVISION  12/25/2011   Procedure: TOTAL HIP REVISION;  Surgeon: Mauri Pole, MD;  Location: [facility];  Service: Orthopedics;  Laterality: Right;  . TOTAL KNEE ARTHROPLASTY  2008   left  . TOTAL KNEE ARTHROPLASTY  09/04/2011   Procedure: TOTAL KNEE ARTHROPLASTY;  Surgeon: Mauri Pole, MD;  Location: [facility];  Service: Orthopedics;  Laterality: Right;  . TOTAL SHOULDER ARTHROPLASTY Left 10/08/2014   Procedure: LEFT TOTAL SHOULDER ARTHROPLASTY;  Surgeon: Justice Britain, MD;  Location: [facility];  Service: Orthopedics;  Laterality: Left;  . TOTAL SHOULDER REPLACEMENT Left 10/08/2014   dr supple    Family History  Problem Relation Age of Onset  . Heart failure Mother   . Diabetes Mother   . Hypertension Mother   . Early death Father   . Pneumonia Father   . Alcohol abuse Father   . Heart disease Brother   . Hypertension Brother   . Diabetes Brother   . Hypertension Sister   . Hypertension Sister   . Sickle cell trait Brother   .  GER disease Brother   . Thyroid disease Daughter   . Heart failure Daughter   . Heart disease Maternal Aunt   . Heart disease Maternal Uncle   . Colon cancer Neg Hx     Social History   Social History  . Marital status: Divorced    Spouse name: N/A  . Number of children: 1  . Years of education: N/A   Occupational History  .  [employer]   Social History Main Topics  . Smoking status: Never Smoker  . Smokeless tobacco: Never Used  . Alcohol use No  . Drug use: No  . Sexual activity: Not Currently    Birth control/ protection: Surgical     Comment: hyst   Other Topics Concern  . Not on file   Social History Narrative  . No narrative on file      ROS:  General: Negative for anorexia, weight loss, fever, chills, fatigue, weakness. Eyes: Negative for vision changes.  ENT: Negative for hoarseness, difficulty swallowing , nasal congestion. CV: Negative for chest pain, angina, palpitations, dyspnea on exertion, peripheral edema.  Respiratory: Negative for dyspnea at rest, dyspnea on exertion, cough, sputum, wheezing.  GI: See history of present illness. GU:  Negative for dysuria, hematuria, urinary incontinence, urinary frequency, nocturnal urination.  MS: Negative for joint pain, low back pain.  Derm: Negative for rash or itching.  Neuro: Negative for weakness, abnormal sensation, seizure, frequent headaches, memory loss, confusion.  Psych: Negative for anxiety, depression, suicidal ideation, hallucinations.  Endo: Negative for unusual weight change.  Heme: Negative for bruising or bleeding. Allergy: Negative for rash or hives.    Physical Examination:  BP 113/77   Pulse 62   Temp 98.2 F (36.8 C) (Oral)   Ht 5\' 1"  (1.549 m)   Wt 204 lb 3.2 oz (92.6 kg)   BMI 38.58 kg/m    General: Well-nourished, well-developed in no acute distress.  Head: Normocephalic, atraumatic.   Eyes: Conjunctiva pink, no icterus. Mouth: Oropharyngeal mucosa moist and pink  , no lesions erythema or exudate. Neck: Supple without thyromegaly, masses, or lymphadenopathy.  Lungs: Clear to auscultation bilaterally.  Heart: Regular rate and rhythm, no murmurs rubs or gallops.  Abdomen: Bowel sounds are normal, nontender, nondistended, no hepatosplenomegaly or masses, no abdominal bruits or    hernia , no rebound or guarding.   Rectal: Deferred Extremities: No lower extremity edema. No clubbing or deformities.  Neuro: Alert and oriented x 4 , grossly normal neurologically.  Skin: Warm and dry, no rash or jaundice.   Psych: Alert and cooperative, normal mood and affect.  Labs: Lab Results  Component Value Date   TSH 0.850 11/25/2015   Lab Results  Component Value Date   HGBA1C  6.1 (H) 11/25/2015   Lab Results  Component Value Date   CREATININE 0.82 11/25/2015   BUN 15 11/25/2015   NA 137 11/25/2015   K 4.2 11/25/2015   CL 96 11/25/2015   CO2 24 11/25/2015   Lab Results  Component Value Date   ALT 16 11/25/2015   AST 23 11/25/2015   ALKPHOS 75 11/25/2015   BILITOT 0.6 11/25/2015   Lab Results  Component Value Date   WBC 4.3 11/25/2015   HGB 14.3 08/31/2015   HCT 38.8 11/25/2015   MCV 83 11/25/2015   PLT 284 11/25/2015     Imaging Studies: No results found.

## 2016-04-03 NOTE — Progress Notes (Signed)
cc'ed to pcp °

## 2016-04-19 ENCOUNTER — Telehealth: Payer: Self-pay | Admitting: Internal Medicine

## 2016-04-19 NOTE — Telephone Encounter (Signed)
Patient is scheduled for colonoscopy on 10/13 with RMR, She has questions about taking her prep. She is leaving for work around 330pm if someone could call her before then. [place]

## 2016-04-19 NOTE — Telephone Encounter (Signed)
talked with pt and answered questions

## 2016-04-21 ENCOUNTER — Encounter (HOSPITAL_COMMUNITY): Payer: Self-pay | Admitting: *Deleted

## 2016-04-21 ENCOUNTER — Encounter (HOSPITAL_COMMUNITY)
Admission: RE | Disposition: A | Discharge status: Home / Self Care | Payer: Self-pay | Source: Ambulatory Visit | Attending: Internal Medicine

## 2016-04-21 ENCOUNTER — Ambulatory Visit (HOSPITAL_COMMUNITY)
Admission: RE | Admit: 2016-04-21 | Discharge: 2016-04-21 | Disposition: A | Discharge status: Home / Self Care | Payer: 59 | Source: Ambulatory Visit | Attending: Internal Medicine | Admitting: Internal Medicine

## 2016-04-21 DIAGNOSIS — K5730 Diverticulosis of large intestine without perforation or abscess without bleeding: Secondary | ICD-10-CM | POA: Diagnosis not present

## 2016-04-21 DIAGNOSIS — Z8371 Family history of colonic polyps: Secondary | ICD-10-CM | POA: Insufficient documentation

## 2016-04-21 DIAGNOSIS — Z96643 Presence of artificial hip joint, bilateral: Secondary | ICD-10-CM | POA: Insufficient documentation

## 2016-04-21 DIAGNOSIS — E119 Type 2 diabetes mellitus without complications: Secondary | ICD-10-CM | POA: Insufficient documentation

## 2016-04-21 DIAGNOSIS — Z1211 Encounter for screening for malignant neoplasm of colon: Secondary | ICD-10-CM | POA: Insufficient documentation

## 2016-04-21 DIAGNOSIS — Z6839 Body mass index (BMI) 39.0-39.9, adult: Secondary | ICD-10-CM | POA: Diagnosis not present

## 2016-04-21 DIAGNOSIS — Q438 Other specified congenital malformations of intestine: Secondary | ICD-10-CM | POA: Diagnosis not present

## 2016-04-21 DIAGNOSIS — Z86010 Personal history of colonic polyps: Secondary | ICD-10-CM | POA: Insufficient documentation

## 2016-04-21 DIAGNOSIS — I10 Essential (primary) hypertension: Secondary | ICD-10-CM | POA: Insufficient documentation

## 2016-04-21 DIAGNOSIS — E669 Obesity, unspecified: Secondary | ICD-10-CM | POA: Insufficient documentation

## 2016-04-21 DIAGNOSIS — K5909 Other constipation: Secondary | ICD-10-CM | POA: Diagnosis not present

## 2016-04-21 DIAGNOSIS — Z96612 Presence of left artificial shoulder joint: Secondary | ICD-10-CM | POA: Insufficient documentation

## 2016-04-21 DIAGNOSIS — Z79899 Other long term (current) drug therapy: Secondary | ICD-10-CM | POA: Diagnosis not present

## 2016-04-21 HISTORY — PX: COLONOSCOPY: SHX5424

## 2016-04-21 SURGERY — COLONOSCOPY
Anesthesia: Moderate Sedation

## 2016-04-21 MED ORDER — SIMETHICONE 40 MG/0.6ML PO SUSP
ORAL | Status: DC | PRN
  Administered 2016-04-21: 2.5 mL

## 2016-04-21 MED ORDER — MIDAZOLAM HCL 5 MG/5ML IJ SOLN
INTRAMUSCULAR | Status: DC | PRN
  Administered 2016-04-21: 2 mg via INTRAVENOUS
  Administered 2016-04-21: 1 mg via INTRAVENOUS
  Administered 2016-04-21: 2 mg via INTRAVENOUS

## 2016-04-21 MED ORDER — ONDANSETRON HCL 4 MG/2ML IJ SOLN
INTRAMUSCULAR | Status: AC
  Filled 2016-04-21: qty 2

## 2016-04-21 MED ORDER — SODIUM CHLORIDE 0.9 % IV SOLN
INTRAVENOUS | Status: DC
  Administered 2016-04-21: 1000 mL via INTRAVENOUS

## 2016-04-21 MED ORDER — MEPERIDINE HCL 100 MG/ML IJ SOLN
INTRAMUSCULAR | Status: AC
  Filled 2016-04-21: qty 2

## 2016-04-21 MED ORDER — ONDANSETRON HCL 4 MG/2ML IJ SOLN
INTRAMUSCULAR | Status: DC | PRN
  Administered 2016-04-21: 4 mg via INTRAVENOUS

## 2016-04-21 MED ORDER — MIDAZOLAM HCL 5 MG/5ML IJ SOLN
INTRAMUSCULAR | Status: AC
  Filled 2016-04-21: qty 10

## 2016-04-21 MED ORDER — MEPERIDINE HCL 100 MG/ML IJ SOLN
INTRAMUSCULAR | Status: DC | PRN
  Administered 2016-04-21: 25 mg via INTRAVENOUS
  Administered 2016-04-21 (×2): 50 mg via INTRAVENOUS

## 2016-04-21 MED ORDER — SIMETHICONE 40 MG/0.6ML PO SUSP
ORAL | Status: AC
  Filled 2016-04-21: qty 30

## 2016-04-21 NOTE — Op Note (Signed)
[facility] Patient Name: [name] Procedure Date: 04/21/2016 8:44 AM MRN: [id number] Date of Birth: 1950/12/17 Attending MD: Norvel Richards , MD CSN: [id number] Age: 65 Admit Type: Outpatient Procedure:                Colonoscopy - Surveillance Indications:              High risk colon cancer surveillance: Personal                            history of colonic polyps Providers:                Norvel Richards, MD, Lurline Del, RN, Charlyne Petrin, RN, Purcell Nails. Berry, Mer[employer] Referring MD:              Medicines:                Midazolam 5 mg IV, Meperidine 125 mg IV,                            Ondansetron 4 mg IV Complications:            No immediate complications. Estimated Blood Loss:     Estimated blood loss: none. Procedure:                Pre-Anesthesia Assessment:                           - Prior to the procedure, a History and Physical                            was performed, and patient medications and                            allergies were reviewed. The patient's tolerance of                            previous anesthesia was also reviewed. The risks                            and benefits of the procedure and the sedation                            options and risks were discussed with the patient.                            All questions were answered, and informed consent                            was obtained. Prior Anticoagulants: The patient has                            taken no previous anticoagulant or antiplatelet  agents. ASA Grade Assessment: II - A patient with                            mild systemic disease. After reviewing the risks                            and benefits, the patient was deemed in                            satisfactory condition to undergo the procedure.                           After obtaining informed consent, the colonoscope   was passed under direct vision. Throughout the                            procedure, the patient's blood pressure, pulse, and                            oxygen saturations were monitored continuously. The                            [id number] scope was introduced through                            the anus and advanced to the the cecum, identified                            by appendiceal orifice and ileocecal valve. The                            colonoscopy was performed without difficulty. The                            patient tolerated the procedure well. The quality                            of the bowel preparation was adequate. The                            ileocecal valve, appendiceal orifice, and rectum                            were photographed. The ileocecal valve, appendiceal                            orifice, and rectum were photographed. The entire                            colon was well visualized. Scope In: 9:04:04 AM Scope Out: 9:20:54 AM Scope Withdrawal Time: 0 hours 8 minutes 53 seconds  Total Procedure Duration: 0 hours 16 minutes 50 seconds  Findings:      The perianal and digital rectal examinations were normal. Redundant /  elongated colon.      Many small and large-mouthed diverticula were found in the sigmoid colon       and descending colon.      The exam was otherwise without abnormality on direct and retroflexion       views. Impression:               - Diverticulosis in the sigmoid colon and in the                            descending colon.                           - The examination was otherwise normal on direct                            and retroflexion views. Redundant colon                           - No specimens collected. Linzess has resolved her                            lower GI tract symptoms. Add Benefiber 2 teaspoons                            twice daily. Moderate Sedation:      Moderate (conscious) sedation was  administered by the endoscopy nurse       and supervised by the endoscopist. The following parameters were       monitored: oxygen saturation, heart rate, blood pressure, respiratory       rate, EKG, adequacy of pulmonary ventilation, and response to care.       Total physician intraservice time was 22 minutes. Recommendation:           - Patient has a contact number available for                            emergencies. The signs and symptoms of potential                            delayed complications were discussed with the                            patient. Return to normal activities tomorrow.                            Written discharge instructions were provided to the                            patient.                           - Resume previous diet.                           - Continue present medications.                           -  Repeat colonoscopy in 5 years for surveillance.                           - Return to GI office in 12 weeks. Procedure Code(s):        --- Professional ---                           [phone], Colonoscopy, flexible; diagnostic, including                            collection of specimen(s) by brushing or washing,                            when performed (separate procedure)                           99152, Moderate sedation services provided by the                            same physician or other qualified health care                            professional performing the diagnostic or                            therapeutic service that the sedation supports,                            requiring the presence of an independent trained                            observer to assist in the monitoring of the                            patient's level of consciousness and physiological                            status; initial 15 minutes of intraservice time,                            patient age 41 years or older Diagnosis Code(s):        ---  Professional ---                           Z86.010, Personal history of colonic polyps                           K57.30, Diverticulosis of large intestine without                            perforation or abscess without bleeding CPT copyright 2016 American Medical Association. All rights reserved. The codes documented in this report are preliminary and upon coder review may  be revised to meet current compliance requirements. Cristopher Estimable. Gala Romney, MD Norvel Richards, MD 04/21/2016 9:32:47 AM  This report has been signed electronically. Number of Addenda: 0

## 2016-04-21 NOTE — Discharge Instructions (Addendum)
Constipation, Adult Constipation is when a person has fewer than three bowel movements a week, has difficulty having a bowel movement, or has stools that are dry, hard, or larger than normal. As people grow older, constipation is more common. A low-fiber diet, not taking in enough fluids, and taking certain medicines may make constipation worse.  CAUSES  Certain medicines, such as antidepressants, pain medicine, iron supplements, antacids, and water pills.  Certain diseases, such as diabetes, irritable bowel syndrome (IBS), thyroid disease, or depression.  Not drinking enough water.  Not eating enough fiber-rich foods.  Stress or travel.  Lack of physical activity or exercise.  Ignoring the urge to have a bowel movement.  Using laxatives too much.  SIGNS AND SYMPTOMS  Having fewer than three bowel movements a week.  Straining to have a bowel movement.  Having stools that are hard, dry, or larger than normal.  Feeling full or bloated.  Pain in the lower abdomen.  Not feeling relief after having a bowel movement.  DIAGNOSIS  Your health care provider will take a medical history and perform a physical exam. Further testing may be done for severe constipation. Some tests may include: A barium enema X-ray to examine your rectum, colon, and, sometimes, your small intestine.  A sigmoidoscopy to examine your lower colon.  A colonoscopy to examine your entire colon. TREATMENT  Treatment will depend on the severity of your constipation and what is causing it. Some dietary treatments include drinking more fluids and eating more fiber-rich foods. Lifestyle treatments may include regular exercise. If these diet and lifestyle recommendations do not help, your health care provider may recommend taking over-the-counter laxative medicines to help you have bowel movements. Prescription medicines may be prescribed if over-the-counter medicines do not work.  HOME CARE INSTRUCTIONS  Eat foods  that have a lot of fiber, such as fruits, vegetables, whole grains, and beans. Limit foods high in fat and processed sugars, such as french fries, hamburgers, cookies, candies, and soda.  A fiber supplement may be added to your diet if you cannot get enough fiber from foods.  Drink enough fluids to keep your urine clear or pale yellow.  Exercise regularly or as directed by your health care provider.  Go to the restroom when you have the urge to go. Do not hold it.  Only take over-the-counter or prescription medicines as directed by your health care provider. Do not take other medicines for constipation without talking to your health care provider first.  Nellie IF:  You have bright red blood in your stool.  Your constipation lasts for more than 4 days or gets worse.  You have abdominal or rectal pain.  You have thin, pencil-like stools.  You have unexplained weight loss. MAKE SURE YOU:  Understand these instructions. Will watch your condition. Will get help right away if you are not doing well or get worse.   This information is not intended to replace advice given to you by your health care provider. Make sure you discuss any questions you have with your health care provider.   Document Released: 03/24/2004 Document Revised: 07/17/2014 Document Reviewed: 04/07/2013 Elsevier Interactive Patient Education 2016 [employer]. Diverticulosis Diverticulosis is the condition that develops when small pouches (diverticula) form in the wall of your colon. Your colon, or large intestine, is where water is absorbed and stool is formed. The pouches form when the inside layer of your colon pushes through weak spots in the outer layers of your colon.  CAUSES  No one knows exactly what causes diverticulosis. RISK FACTORS  Being older than 51. Your risk for this condition increases with age. Diverticulosis is rare in people younger than 40 years. By age 81, almost everyone  has it.  Eating a low-fiber diet.  Being frequently constipated.  Being overweight.  Not getting enough exercise.  Smoking.  Taking over-the-counter pain medicines, like aspirin and ibuprofen. SYMPTOMS  Most people with diverticulosis do not have symptoms. DIAGNOSIS  Because diverticulosis often has no symptoms, health care providers often discover the condition during an exam for other colon problems. In many cases, a health care provider will diagnose diverticulosis while using a flexible scope to examine the colon (colonoscopy). TREATMENT  If you have never developed an infection related to diverticulosis, you may not need treatment. If you have had an infection before, treatment may include:  Eating more fruits, vegetables, and grains.  Taking a fiber supplement.  Taking a live bacteria supplement (probiotic).  Taking medicine to relax your colon. HOME CARE INSTRUCTIONS   Drink at least 6-8 glasses of water each day to prevent constipation.  Try not to strain when you have a bowel movement.  Keep all follow-up appointments. If you have had an infection before:  Increase the fiber in your diet as directed by your health care provider or dietitian.  Take a dietary fiber supplement if your health care provider approves.  Only take medicines as directed by your health care provider. SEEK MEDICAL CARE IF:   You have abdominal pain.  You have bloating.  You have cramps.  You have not gone to the bathroom in 3 days. SEEK IMMEDIATE MEDICAL CARE IF:   Your pain gets worse.  Yourbloating becomes very bad.  You have a fever or chills, and your symptoms suddenly get worse.  You begin vomiting.  You have bowel movements that are bloody or black. MAKE SURE YOU:  Understand these instructions.  Will watch your condition.  Will get help right away if you are not doing well or get worse.   This information is not intended to replace advice given to you by  your health care provider. Make sure you discuss any questions you have with your health care provider.   Document Released: 03/23/2004 Document Revised: 07/01/2013 Document Reviewed: 05/21/2013 Elsevier Interactive Patient Education 2016 Elsevier Inc.  Colonoscopy Discharge Instructions  Read the instructions outlined below and refer to this sheet in the next few weeks. These discharge instructions provide you with general information on caring for yourself after you leave the hospital. Your doctor may also give you specific instructions. While your treatment has been planned according to the most current medical practices available, unavoidable complications occasionally occur. If you have any problems or questions after discharge, call Dr. Gala Romney at [phone]. ACTIVITY  You may resume your regular activity, but move at a slower pace for the next 24 hours.   Take frequent rest periods for the next 24 hours.   Walking will help get rid of the air and reduce the bloated feeling in your belly (abdomen).   No driving for 24 hours (because of the medicine (anesthesia) used during the test).    Do not sign any important legal documents or operate any machinery for 24 hours (because of the anesthesia used during the test).  NUTRITION  Drink plenty of fluids.   You may resume your normal diet as instructed by your doctor.   Begin with a light meal and progress to your  normal diet. Heavy or fried foods are harder to digest and may make you feel sick to your stomach (nauseated).   Avoid alcoholic beverages for 24 hours or as instructed.  MEDICATIONS  You may resume your normal medications unless your doctor tells you otherwise.  WHAT YOU CAN EXPECT TODAY  Some feelings of bloating in the abdomen.   Passage of more gas than usual.   Spotting of blood in your stool or on the toilet paper.  IF YOU HAD POLYPS REMOVED DURING THE COLONOSCOPY:  No aspirin products for 7 days or as  instructed.   No alcohol for 7 days or as instructed.   Eat a soft diet for the next 24 hours.  FINDING OUT THE RESULTS OF YOUR TEST Not all test results are available during your visit. If your test results are not back during the visit, make an appointment with your caregiver to find out the results. Do not assume everything is normal if you have not heard from your caregiver or the medical facility. It is important for you to follow up on all of your test results.  SEEK IMMEDIATE MEDICAL ATTENTION IF:  You have more than a spotting of blood in your stool.   Your belly is swollen (abdominal distention).   You are nauseated or vomiting.   You have a temperature over 101.   You have abdominal pain or discomfort that is severe or gets worse throughout the day.      Constipation and diverticulosis information provided  Continue Linzess daily  Benefiber 2 teaspoons twice daily  Repeat colonoscopy in 5 years  Office visit with [place] in 3 months

## 2016-04-21 NOTE — Interval H&P Note (Signed)
History and Physical Interval Note:  04/21/2016 8:46 AM  [name]  has presented today for surgery, with the diagnosis of COLON POLYPS  The various methods of treatment have been discussed with the patient and family. After consideration of risks, benefits and other options for treatment, the patient has consented to  Procedure(s) with comments: COLONOSCOPY (N/A) - 930  as a surgical intervention .  The patient's history has been reviewed, patient examined, no change in status, stable for surgery.  I have reviewed the patient's chart and labs.  Questions were answered to the patient's satisfaction.     Carla Whilden  No change;  The risks, benefits, limitations, alternatives and imponderables have been reviewed with the patient. Questions have been answered. All parties are agreeable.

## 2016-04-21 NOTE — H&P (View-Only) (Signed)
Primary Care Physician:  Purvis Kilts, MD  Primary Gastroenterologist:  Garfield Cornea, MD   Chief Complaint  Patient presents with  . Constipation    small in size and shape    HPI:  [name] is a 65 y.o. female here For further evaluation of change in stool caliber. She has chronic constipation. Takes MiraLAX daily but only has a BM every other day or so. Denies melena or rectal bleeding. Over the past several months she feels like her stool caliber has become much smaller than before. She worries about this. She is due for colonoscopy in December 2017 for surveillance purposes due to adenomatous colon polyps. She denies abdominal pain. No heartburn, dysphagia, vomiting. She rarely takes Vicodin for neck pain.  FH of adenomatous colon polyps, two sisters. No FH of CRC.   Current Outpatient Prescriptions  Medication Sig Dispense Refill  . buPROPion (WELLBUTRIN XL) 300 MG 24 hr tablet Take 300 mg by mouth daily.    . COCONUT OIL PO Take by mouth.    . Flaxseed Oil OIL by Does not apply route.    Marland Kitchen HYDROcodone-acetaminophen (NORCO/VICODIN) 5-325 MG tablet Take 1 tablet by mouth 2 (two) times daily as needed for moderate pain.     Marland Kitchen ibuprofen (ADVIL,MOTRIN) 800 MG tablet Take 800 mg by mouth as needed.     Marland Kitchen lisinopril-hydrochlorothiazide (PRINZIDE,ZESTORETIC) 20-12.5 MG per tablet Take 1 tablet by mouth daily before breakfast.     . polyethylene glycol powder (MIRALAX) powder Take 1 Container by mouth daily.    Marland Kitchen zolpidem (AMBIEN) 10 MG tablet Take 1 tablet by mouth at bedtime as needed and may repeat dose one time if needed.     No current facility-administered medications for this visit.     Allergies as of 03/31/2016  . (No Known Allergies)    Past Medical History:  Diagnosis Date  . Anxiety   . Arthritis, rheumatoid ([place])   . Complication of anesthesia    itching after spinal with knee replacment, stop breathing at times after 2001 hip replacment surgery  .  Constipation   . Constipation - functional 10/31/2013  . Diabetes mellitus    diet controlled  --NOTE FROM DR. GOLDING PT'S AIC WAS 5.5 ON 12/19/11  . Headache   . HTN (hypertension)   . Hyperlipidemia   . Obesity   . Osteoarthritis   . Superficial fungus infection of skin 11/25/2015  . Urge incontinence 11/04/2014  . Vertigo   . Vitamin D deficiency     Past Surgical History:  Procedure Laterality Date  . ABDOMINAL HYSTERECTOMY  1989  . COLONOSCOPY  10/20/02   normal rectum/left-sided diverticula  . COLONOSCOPY  07/06/2011   Dr. Gala Romney: Dionisio David anal canal hemorrhoids, diminutive rectal and transverse colon polyps. 2 adenoma on path. Left-sided diverticulosis.  Marland Kitchen JOINT REPLACEMENT    . PARTIAL HYSTERECTOMY  1987  . TOE SURGERY     second toe on both feet, bunionectomy  . TOTAL HIP ARTHROPLASTY  2001/2008   multiple surgeries on right hip including replacements. Two replacements and four dislocations.   Marland Kitchen TOTAL HIP ARTHROPLASTY  2010   left  . TOTAL HIP REVISION  12/25/2011   Procedure: TOTAL HIP REVISION;  Surgeon: Mauri Pole, MD;  Location: [facility];  Service: Orthopedics;  Laterality: Right;  . TOTAL KNEE ARTHROPLASTY  2008   left  . TOTAL KNEE ARTHROPLASTY  09/04/2011   Procedure: TOTAL KNEE ARTHROPLASTY;  Surgeon: Mauri Pole, MD;  Location: [facility];  Service: Orthopedics;  Laterality: Right;  . TOTAL SHOULDER ARTHROPLASTY Left 10/08/2014   Procedure: LEFT TOTAL SHOULDER ARTHROPLASTY;  Surgeon: Justice Britain, MD;  Location: [facility];  Service: Orthopedics;  Laterality: Left;  . TOTAL SHOULDER REPLACEMENT Left 10/08/2014   dr supple    Family History  Problem Relation Age of Onset  . Heart failure Mother   . Diabetes Mother   . Hypertension Mother   . Early death Father   . Pneumonia Father   . Alcohol abuse Father   . Heart disease Brother   . Hypertension Brother   . Diabetes Brother   . Hypertension Sister   . Hypertension Sister   . Sickle cell trait Brother   .  GER disease Brother   . Thyroid disease Daughter   . Heart failure Daughter   . Heart disease Maternal Aunt   . Heart disease Maternal Uncle   . Colon cancer Neg Hx     Social History   Social History  . Marital status: Divorced    Spouse name: N/A  . Number of children: 1  . Years of education: N/A   Occupational History  .  [employer]   Social History Main Topics  . Smoking status: Never Smoker  . Smokeless tobacco: Never Used  . Alcohol use No  . Drug use: No  . Sexual activity: Not Currently    Birth control/ protection: Surgical     Comment: hyst   Other Topics Concern  . Not on file   Social History Narrative  . No narrative on file      ROS:  General: Negative for anorexia, weight loss, fever, chills, fatigue, weakness. Eyes: Negative for vision changes.  ENT: Negative for hoarseness, difficulty swallowing , nasal congestion. CV: Negative for chest pain, angina, palpitations, dyspnea on exertion, peripheral edema.  Respiratory: Negative for dyspnea at rest, dyspnea on exertion, cough, sputum, wheezing.  GI: See history of present illness. GU:  Negative for dysuria, hematuria, urinary incontinence, urinary frequency, nocturnal urination.  MS: Negative for joint pain, low back pain.  Derm: Negative for rash or itching.  Neuro: Negative for weakness, abnormal sensation, seizure, frequent headaches, memory loss, confusion.  Psych: Negative for anxiety, depression, suicidal ideation, hallucinations.  Endo: Negative for unusual weight change.  Heme: Negative for bruising or bleeding. Allergy: Negative for rash or hives.    Physical Examination:  BP 113/77   Pulse 62   Temp 98.2 F (36.8 C) (Oral)   Ht 5\' 1"  (1.549 m)   Wt 204 lb 3.2 oz (92.6 kg)   BMI 38.58 kg/m    General: Well-nourished, well-developed in no acute distress.  Head: Normocephalic, atraumatic.   Eyes: Conjunctiva pink, no icterus. Mouth: Oropharyngeal mucosa moist and pink  , no lesions erythema or exudate. Neck: Supple without thyromegaly, masses, or lymphadenopathy.  Lungs: Clear to auscultation bilaterally.  Heart: Regular rate and rhythm, no murmurs rubs or gallops.  Abdomen: Bowel sounds are normal, nontender, nondistended, no hepatosplenomegaly or masses, no abdominal bruits or    hernia , no rebound or guarding.   Rectal: Deferred Extremities: No lower extremity edema. No clubbing or deformities.  Neuro: Alert and oriented x 4 , grossly normal neurologically.  Skin: Warm and dry, no rash or jaundice.   Psych: Alert and cooperative, normal mood and affect.  Labs: Lab Results  Component Value Date   TSH 0.850 11/25/2015   Lab Results  Component Value Date   HGBA1C  6.1 (H) 11/25/2015   Lab Results  Component Value Date   CREATININE 0.82 11/25/2015   BUN 15 11/25/2015   NA 137 11/25/2015   K 4.2 11/25/2015   CL 96 11/25/2015   CO2 24 11/25/2015   Lab Results  Component Value Date   ALT 16 11/25/2015   AST 23 11/25/2015   ALKPHOS 75 11/25/2015   BILITOT 0.6 11/25/2015   Lab Results  Component Value Date   WBC 4.3 11/25/2015   HGB 14.3 08/31/2015   HCT 38.8 11/25/2015   MCV 83 11/25/2015   PLT 284 11/25/2015     Imaging Studies: No results found.

## 2016-04-28 ENCOUNTER — Encounter (HOSPITAL_COMMUNITY): Payer: Self-pay | Admitting: Internal Medicine

## 2016-06-09 HISTORY — PX: CARPAL TUNNEL RELEASE: SHX101

## 2016-07-24 ENCOUNTER — Ambulatory Visit: Payer: 59 | Admitting: Gastroenterology

## 2016-09-04 ENCOUNTER — Ambulatory Visit: Payer: 59 | Admitting: Gastroenterology

## 2016-09-22 ENCOUNTER — Other Ambulatory Visit: Payer: Self-pay | Admitting: Adult Health

## 2016-09-22 DIAGNOSIS — Z1231 Encounter for screening mammogram for malignant neoplasm of breast: Secondary | ICD-10-CM

## 2016-11-27 ENCOUNTER — Ambulatory Visit (HOSPITAL_COMMUNITY)
Admission: RE | Admit: 2016-11-27 | Discharge: 2016-11-27 | Disposition: A | Discharge status: Home / Self Care | Payer: 59 | Source: Ambulatory Visit | Attending: Adult Health | Admitting: Adult Health

## 2016-11-27 DIAGNOSIS — Z1231 Encounter for screening mammogram for malignant neoplasm of breast: Secondary | ICD-10-CM | POA: Insufficient documentation

## 2016-11-28 ENCOUNTER — Encounter: Payer: Self-pay | Admitting: Adult Health

## 2016-11-28 ENCOUNTER — Ambulatory Visit (INDEPENDENT_AMBULATORY_CARE_PROVIDER_SITE_OTHER): Payer: 59 | Admitting: Adult Health

## 2016-11-28 VITALS — BP 100/62 | HR 56 | Ht 61.0 in | Wt 212.5 lb

## 2016-11-28 DIAGNOSIS — Z1211 Encounter for screening for malignant neoplasm of colon: Secondary | ICD-10-CM | POA: Diagnosis not present

## 2016-11-28 DIAGNOSIS — R319 Hematuria, unspecified: Secondary | ICD-10-CM | POA: Diagnosis not present

## 2016-11-28 DIAGNOSIS — Z01419 Encounter for gynecological examination (general) (routine) without abnormal findings: Secondary | ICD-10-CM

## 2016-11-28 DIAGNOSIS — Z1212 Encounter for screening for malignant neoplasm of rectum: Secondary | ICD-10-CM | POA: Diagnosis not present

## 2016-11-28 DIAGNOSIS — N3941 Urge incontinence: Secondary | ICD-10-CM

## 2016-11-28 LAB — HEMOCCULT GUIAC POC 1CARD (OFFICE): FECAL OCCULT BLD: NEGATIVE

## 2016-11-28 LAB — POCT URINALYSIS DIPSTICK
GLUCOSE UA: NEGATIVE
Ketones, UA: NEGATIVE
Leukocytes, UA: NEGATIVE
NITRITE UA: NEGATIVE
Protein, UA: NEGATIVE

## 2016-11-28 MED ORDER — MIRABEGRON ER 50 MG PO TB24: 50.0000 mg | ORAL_TABLET | Freq: Every day | ORAL | 1 refills | Status: DC

## 2016-11-28 NOTE — Progress Notes (Signed)
Patient ID: [name], female   DOB: 11-06-1950, 66 y.o.   MRN: [id number] History of Present Illness: [name] is s 66 year old black female in for well woman gyn exam, she is sp hysterectomy.She works at [employer], but is out of work due to surgery on right hand and elbow. Has urge incontinence.  PCP is Dr Hilma Favors.    Current Medications, Allergies, Past Medical History, Past Surgical History, Family History and Social History were reviewed in [employer] record.     Review of Systems: Patient denies any headaches, hearing loss, fatigue, blurred vision, shortness of breath, chest pain, abdominal pain, problems with bowel movements, or intercourse(not currently). No mood swings.+joint pain, and urge incontinence, will lose urine occasionally when sneezes but not much.     Physical Exam:BP 100/62 (BP Location: Left Arm, Patient Position: Sitting, Cuff Size: Large)   Pulse (!) 56   Ht 5\' 1"  (1.549 m)   Wt 212 lb 8 oz (96.4 kg)   BMI 40.15 kg/m Urine trace blood  General:  Well developed, well nourished, no acute distress Skin:  Warm and dry Neck:  Midline trachea, normal thyroid, good ROM, no lymphadenopathy Lungs; Clear to auscultation bilaterally Breast:  No dominant palpable mass, retraction, or nipple discharge Cardiovascular: Regular rate and rhythm Abdomen:  Soft, non tender, no hepatosplenomegaly Pelvic:  External genitalia is normal in appearance, no lesions.  The vagina is normal in appearance. Urethra has no lesions or masses. The cervix and uterus are absent. No adnexal masses or tenderness noted.Bladder is non tender, no masses felt. Rectal: Good sphincter tone, no polyps, or hemorrhoids felt.  Hemoccult negative. Extremities/musculoskeletal:  No swelling or varicosities noted, no clubbing or cyanosis, has had surgery right hand and elbow, due to RA. Psych:  No mood changes, alert and cooperative,seems happy PHQ 9 score 6,may be depressed due to  being home since surgery, is on Wellbutrin.Denies any suicidal ideations. Will try 50 mg of myrbetriq and if no help may add vesicare and then if not better, refer to Dr McDermot at [facility].   Impression: 1. Well woman exam with routine gynecological exam   2. Hematuria, unspecified type   3. Urge incontinence   4. Screening for colorectal cancer       Plan: UA C&S sent  Rx myrbetriq 5o mg take 1 daily with 1 refill Follow up in 4 weeks Physical in 1 year Mammogram yearly Labs with PCP  Colonoscopy per GI

## 2016-11-29 LAB — URINALYSIS, ROUTINE W REFLEX MICROSCOPIC
BILIRUBIN UA: NEGATIVE
GLUCOSE, UA: NEGATIVE
Ketones, UA: NEGATIVE
LEUKOCYTES UA: NEGATIVE
Nitrite, UA: NEGATIVE
PROTEIN UA: NEGATIVE
RBC UA: NEGATIVE
Specific Gravity, UA: 1.02 (ref 1.005–1.030)
UUROB: 0.2 mg/dL (ref 0.2–1.0)
pH, UA: 8.5 — ABNORMAL HIGH (ref 5.0–7.5)

## 2016-11-30 LAB — URINE CULTURE: ORGANISM ID, BACTERIA: NO GROWTH

## 2016-12-22 ENCOUNTER — Other Ambulatory Visit: Payer: Self-pay | Admitting: Family Medicine

## 2016-12-22 DIAGNOSIS — E2839 Other primary ovarian failure: Secondary | ICD-10-CM

## 2016-12-25 ENCOUNTER — Ambulatory Visit
Admission: RE | Admit: 2016-12-25 | Discharge: 2016-12-25 | Disposition: A | Discharge status: Home / Self Care | Payer: 59 | Source: Ambulatory Visit | Attending: Family Medicine | Admitting: Family Medicine

## 2016-12-25 DIAGNOSIS — E2839 Other primary ovarian failure: Secondary | ICD-10-CM

## 2016-12-26 ENCOUNTER — Ambulatory Visit: Payer: 59 | Admitting: Adult Health

## 2017-05-07 DIAGNOSIS — M25562 Pain in left knee: Secondary | ICD-10-CM | POA: Diagnosis not present

## 2017-05-07 DIAGNOSIS — M25561 Pain in right knee: Secondary | ICD-10-CM | POA: Diagnosis not present

## 2017-05-07 DIAGNOSIS — Z471 Aftercare following joint replacement surgery: Secondary | ICD-10-CM | POA: Diagnosis not present

## 2017-05-07 DIAGNOSIS — Z96653 Presence of artificial knee joint, bilateral: Secondary | ICD-10-CM | POA: Diagnosis not present

## 2017-05-10 ENCOUNTER — Other Ambulatory Visit (HOSPITAL_COMMUNITY): Payer: Self-pay | Admitting: Orthopedic Surgery

## 2017-05-10 DIAGNOSIS — Z96653 Presence of artificial knee joint, bilateral: Principal | ICD-10-CM

## 2017-05-10 DIAGNOSIS — Z471 Aftercare following joint replacement surgery: Secondary | ICD-10-CM

## 2017-05-17 ENCOUNTER — Encounter (HOSPITAL_COMMUNITY)
Admission: RE | Admit: 2017-05-17 | Discharge: 2017-05-17 | Disposition: A | Discharge status: Home / Self Care | Payer: Medicare Other | Source: Ambulatory Visit | Attending: Orthopedic Surgery | Admitting: Orthopedic Surgery

## 2017-05-17 DIAGNOSIS — Z96653 Presence of artificial knee joint, bilateral: Secondary | ICD-10-CM | POA: Insufficient documentation

## 2017-05-17 DIAGNOSIS — Z471 Aftercare following joint replacement surgery: Secondary | ICD-10-CM | POA: Diagnosis not present

## 2017-05-17 MED ORDER — TECHNETIUM TC 99M MEDRONATE IV KIT
22.0000 | PACK | Freq: Once | INTRAVENOUS | Status: AC
  Administered 2017-05-17: 22 via INTRAVENOUS

## 2017-05-25 DIAGNOSIS — M25561 Pain in right knee: Secondary | ICD-10-CM | POA: Diagnosis not present

## 2017-05-25 DIAGNOSIS — M25562 Pain in left knee: Secondary | ICD-10-CM | POA: Diagnosis not present

## 2017-06-05 ENCOUNTER — Telehealth: Payer: Self-pay | Admitting: Adult Health

## 2017-06-05 NOTE — Telephone Encounter (Signed)
LMOVM to return call.

## 2017-06-05 NOTE — Telephone Encounter (Signed)
Patient called and left a message on machine that she would like for [name] to call her in a refill of a cream that she has prescribed before for a rash. Please contact pt

## 2017-06-06 ENCOUNTER — Telehealth: Payer: Self-pay | Admitting: Adult Health

## 2017-06-06 MED ORDER — NYSTATIN-TRIAMCINOLONE 100000-0.1 UNIT/GM-% EX CREA: 1.0000 "application " | TOPICAL_CREAM | Freq: Two times a day (BID) | CUTANEOUS | 1 refills | Status: DC

## 2017-06-06 NOTE — Telephone Encounter (Signed)
Left message that cream sent to [facility]

## 2017-06-06 NOTE — Telephone Encounter (Signed)
Patient called stating she has a rash on her "boo boo" that itches occasionally. It is in the same area as before on the side near her rectum. She has had it before and applied the cream prescribed and it helped but she is out of the cream and would like a refill. PLease advise.

## 2017-06-11 DIAGNOSIS — M25562 Pain in left knee: Secondary | ICD-10-CM | POA: Diagnosis not present

## 2017-06-11 DIAGNOSIS — M25561 Pain in right knee: Secondary | ICD-10-CM | POA: Diagnosis not present

## 2017-06-12 DIAGNOSIS — Z1389 Encounter for screening for other disorder: Secondary | ICD-10-CM | POA: Diagnosis not present

## 2017-06-12 DIAGNOSIS — Z23 Encounter for immunization: Secondary | ICD-10-CM | POA: Diagnosis not present

## 2017-06-14 DIAGNOSIS — M25561 Pain in right knee: Secondary | ICD-10-CM | POA: Diagnosis not present

## 2017-06-14 DIAGNOSIS — M25562 Pain in left knee: Secondary | ICD-10-CM | POA: Diagnosis not present

## 2017-06-21 DIAGNOSIS — M25561 Pain in right knee: Secondary | ICD-10-CM | POA: Diagnosis not present

## 2017-06-21 DIAGNOSIS — M25562 Pain in left knee: Secondary | ICD-10-CM | POA: Diagnosis not present

## 2017-06-25 DIAGNOSIS — M25562 Pain in left knee: Secondary | ICD-10-CM | POA: Diagnosis not present

## 2017-06-25 DIAGNOSIS — M25561 Pain in right knee: Secondary | ICD-10-CM | POA: Diagnosis not present

## 2017-06-28 DIAGNOSIS — M25562 Pain in left knee: Secondary | ICD-10-CM | POA: Diagnosis not present

## 2017-06-28 DIAGNOSIS — M25561 Pain in right knee: Secondary | ICD-10-CM | POA: Diagnosis not present

## 2017-07-05 DIAGNOSIS — M25562 Pain in left knee: Secondary | ICD-10-CM | POA: Diagnosis not present

## 2017-07-05 DIAGNOSIS — M25561 Pain in right knee: Secondary | ICD-10-CM | POA: Diagnosis not present

## 2017-07-09 DIAGNOSIS — M25561 Pain in right knee: Secondary | ICD-10-CM | POA: Diagnosis not present

## 2017-07-09 DIAGNOSIS — M25562 Pain in left knee: Secondary | ICD-10-CM | POA: Diagnosis not present

## 2017-07-16 DIAGNOSIS — M25569 Pain in unspecified knee: Secondary | ICD-10-CM | POA: Diagnosis not present

## 2017-07-19 DIAGNOSIS — M25569 Pain in unspecified knee: Secondary | ICD-10-CM | POA: Diagnosis not present

## 2017-07-24 DIAGNOSIS — M25569 Pain in unspecified knee: Secondary | ICD-10-CM | POA: Diagnosis not present

## 2017-07-26 DIAGNOSIS — M25569 Pain in unspecified knee: Secondary | ICD-10-CM | POA: Diagnosis not present

## 2017-08-28 ENCOUNTER — Other Ambulatory Visit: Payer: Self-pay | Admitting: Adult Health

## 2017-10-03 DIAGNOSIS — M160 Bilateral primary osteoarthritis of hip: Secondary | ICD-10-CM | POA: Diagnosis not present

## 2017-10-03 DIAGNOSIS — M170 Bilateral primary osteoarthritis of knee: Secondary | ICD-10-CM | POA: Diagnosis not present

## 2017-10-24 ENCOUNTER — Other Ambulatory Visit: Payer: Self-pay | Admitting: Adult Health

## 2017-10-24 DIAGNOSIS — Z1231 Encounter for screening mammogram for malignant neoplasm of breast: Secondary | ICD-10-CM

## 2017-10-30 DIAGNOSIS — H4323 Crystalline deposits in vitreous body, bilateral: Secondary | ICD-10-CM | POA: Diagnosis not present

## 2017-10-30 DIAGNOSIS — H52223 Regular astigmatism, bilateral: Secondary | ICD-10-CM | POA: Diagnosis not present

## 2017-10-30 DIAGNOSIS — H524 Presbyopia: Secondary | ICD-10-CM | POA: Diagnosis not present

## 2017-11-21 DIAGNOSIS — M4802 Spinal stenosis, cervical region: Secondary | ICD-10-CM | POA: Diagnosis not present

## 2017-11-21 DIAGNOSIS — M5030 Other cervical disc degeneration, unspecified cervical region: Secondary | ICD-10-CM | POA: Diagnosis not present

## 2017-11-29 ENCOUNTER — Encounter: Payer: Self-pay | Admitting: Adult Health

## 2017-11-29 ENCOUNTER — Ambulatory Visit: Payer: Medicare Other | Admitting: Adult Health

## 2017-11-29 VITALS — BP 120/64 | HR 75 | Ht 61.0 in | Wt 224.5 lb

## 2017-11-29 DIAGNOSIS — Z01419 Encounter for gynecological examination (general) (routine) without abnormal findings: Secondary | ICD-10-CM | POA: Insufficient documentation

## 2017-11-29 DIAGNOSIS — N3941 Urge incontinence: Secondary | ICD-10-CM

## 2017-11-29 DIAGNOSIS — Z01411 Encounter for gynecological examination (general) (routine) with abnormal findings: Secondary | ICD-10-CM | POA: Diagnosis not present

## 2017-11-29 DIAGNOSIS — Z1212 Encounter for screening for malignant neoplasm of rectum: Secondary | ICD-10-CM | POA: Diagnosis not present

## 2017-11-29 DIAGNOSIS — Z1211 Encounter for screening for malignant neoplasm of colon: Secondary | ICD-10-CM | POA: Diagnosis not present

## 2017-11-29 LAB — HEMOCCULT GUIAC POC 1CARD (OFFICE): Fecal Occult Blood, POC: NEGATIVE

## 2017-11-29 MED ORDER — MIRABEGRON ER 50 MG PO TB24: 50.0000 mg | ORAL_TABLET | Freq: Every day | ORAL | 0 refills | Status: DC

## 2017-11-29 NOTE — Progress Notes (Signed)
Patient ID: [name], female   DOB: Mar 21, 1951, 67 y.o.   MRN: [id number] History of Present Illness: [name] is a 67 year old black female,sp hysterectomy in for well woman gyn exam.She has retired. PCP is Dr Hilma Favors.   Current Medications, Allergies, Past Medical History, Past Surgical History, Family History and Social History were reviewed in [employer] record.     Review of Systems: Patient denies any headaches, hearing loss, fatigue, blurred vision, shortness of breath, chest pain, abdominal pain, problems with bowel movements, or intercourse(not having sex). No joint pain or mood swings. +UI.myrbetriq helps, but is out of samples and insurance coverage not good, too expensive    Physical Exam:BP 120/64 (BP Location: Left Arm, Patient Position: Sitting, Cuff Size: Large)   Pulse 75   Ht 5\' 1"  (1.549 m)   Wt 224 lb 8 oz (101.8 kg)   BMI 42.42 kg/m  General:  Well developed, well nourished, no acute distress Skin:  Warm and dry Neck:  Midline trachea, normal thyroid, good ROM, no lymphadenopathy,no carotid bruits heard Lungs; Clear to auscultation bilaterally Breast:  No dominant palpable mass, retraction, or nipple discharge Cardiovascular: Regular rate and rhythm Abdomen:  Soft, non tender, no hepatosplenomegaly Pelvic:  External genitalia is normal in appearance, no lesions.  The vagina is normal in appearance. Urethra has no lesions or masses. The cervix and uterus are absent. No adnexal masses or tenderness noted.Bladder is non tender, no masses felt. Rectal: Good sphincter tone, no polyps, or hemorrhoids felt.  Hemoccult negative. Extremities/musculoskeletal:  No swelling or varicosities noted, no clubbing or cyanosis,has RA in hands  Psych:  No mood changes, alert and cooperative,seems happy PHQ 9 score 9, is on Wellbutrin and denies being suicidal.  Impression: 1. Well woman exam with routine gynecological exam   2. Urge incontinence   3.  Screening for colorectal cancer       Plan: Meds ordered this encounter  Medications  . mirabegron ER (MYRBETRIQ) 50 MG TB24 tablet    Sig: Take 1 tablet (50 mg total) by mouth daily.    Dispense:  56 tablet    Refill:  0    Order Specific Question:   Supervising Provider    Answer:   Tania Ade H [[id number]]  Mammogram in am Labs with PCP  Colonoscopy per GI Physical in 2 years

## 2017-11-30 ENCOUNTER — Ambulatory Visit
Admission: RE | Admit: 2017-11-30 | Discharge: 2017-11-30 | Disposition: A | Discharge status: Home / Self Care | Payer: Medicare Other | Source: Ambulatory Visit | Attending: Adult Health | Admitting: Adult Health

## 2017-11-30 DIAGNOSIS — Z1231 Encounter for screening mammogram for malignant neoplasm of breast: Secondary | ICD-10-CM

## 2017-12-12 DIAGNOSIS — Z0001 Encounter for general adult medical examination with abnormal findings: Secondary | ICD-10-CM | POA: Diagnosis not present

## 2017-12-12 DIAGNOSIS — E782 Mixed hyperlipidemia: Secondary | ICD-10-CM | POA: Diagnosis not present

## 2017-12-12 DIAGNOSIS — R002 Palpitations: Secondary | ICD-10-CM | POA: Diagnosis not present

## 2017-12-12 DIAGNOSIS — Z0000 Encounter for general adult medical examination without abnormal findings: Secondary | ICD-10-CM | POA: Diagnosis not present

## 2017-12-12 DIAGNOSIS — R7309 Other abnormal glucose: Secondary | ICD-10-CM | POA: Diagnosis not present

## 2017-12-17 DIAGNOSIS — M150 Primary generalized (osteo)arthritis: Secondary | ICD-10-CM | POA: Diagnosis not present

## 2017-12-17 DIAGNOSIS — R5383 Other fatigue: Secondary | ICD-10-CM | POA: Diagnosis not present

## 2017-12-17 DIAGNOSIS — M2550 Pain in unspecified joint: Secondary | ICD-10-CM | POA: Diagnosis not present

## 2017-12-31 DIAGNOSIS — M150 Primary generalized (osteo)arthritis: Secondary | ICD-10-CM | POA: Diagnosis not present

## 2017-12-31 DIAGNOSIS — M2550 Pain in unspecified joint: Secondary | ICD-10-CM | POA: Diagnosis not present

## 2018-01-23 DIAGNOSIS — M79641 Pain in right hand: Secondary | ICD-10-CM | POA: Diagnosis not present

## 2018-01-23 DIAGNOSIS — M79642 Pain in left hand: Secondary | ICD-10-CM | POA: Diagnosis not present

## 2018-04-01 DIAGNOSIS — M2550 Pain in unspecified joint: Secondary | ICD-10-CM | POA: Diagnosis not present

## 2018-04-01 DIAGNOSIS — M150 Primary generalized (osteo)arthritis: Secondary | ICD-10-CM | POA: Diagnosis not present

## 2018-04-25 ENCOUNTER — Telehealth: Payer: Self-pay | Admitting: Adult Health

## 2018-04-25 NOTE — Telephone Encounter (Signed)
Patient informed we do have samples and will leave some at front desk.  Will pick up tomorrow.

## 2018-04-25 NOTE — Telephone Encounter (Signed)
Patient called stating that she would like for [facility] to give her samples of the medication that helps with her Bladder issues. Pt states that she is going out if she doesn't pick up at her home number please contact her at her mobile number.

## 2018-06-27 DIAGNOSIS — Z471 Aftercare following joint replacement surgery: Secondary | ICD-10-CM | POA: Diagnosis not present

## 2018-06-27 DIAGNOSIS — Z96653 Presence of artificial knee joint, bilateral: Secondary | ICD-10-CM | POA: Diagnosis not present

## 2018-06-27 DIAGNOSIS — Z96643 Presence of artificial hip joint, bilateral: Secondary | ICD-10-CM | POA: Diagnosis not present

## 2018-07-01 DIAGNOSIS — E782 Mixed hyperlipidemia: Secondary | ICD-10-CM | POA: Diagnosis not present

## 2018-07-01 DIAGNOSIS — R7309 Other abnormal glucose: Secondary | ICD-10-CM | POA: Diagnosis not present

## 2018-07-01 DIAGNOSIS — Z1389 Encounter for screening for other disorder: Secondary | ICD-10-CM | POA: Diagnosis not present

## 2018-07-01 DIAGNOSIS — I10 Essential (primary) hypertension: Secondary | ICD-10-CM | POA: Diagnosis not present

## 2018-07-01 DIAGNOSIS — E7849 Other hyperlipidemia: Secondary | ICD-10-CM | POA: Diagnosis not present

## 2018-07-01 DIAGNOSIS — M1991 Primary osteoarthritis, unspecified site: Secondary | ICD-10-CM | POA: Diagnosis not present

## 2018-07-01 DIAGNOSIS — Z7689 Persons encountering health services in other specified circumstances: Secondary | ICD-10-CM | POA: Diagnosis not present

## 2018-07-11 DIAGNOSIS — G5602 Carpal tunnel syndrome, left upper limb: Secondary | ICD-10-CM | POA: Diagnosis not present

## 2018-07-11 DIAGNOSIS — M13832 Other specified arthritis, left wrist: Secondary | ICD-10-CM | POA: Diagnosis not present

## 2018-07-11 DIAGNOSIS — G5622 Lesion of ulnar nerve, left upper limb: Secondary | ICD-10-CM | POA: Diagnosis not present

## 2018-07-11 DIAGNOSIS — M79642 Pain in left hand: Secondary | ICD-10-CM | POA: Diagnosis not present

## 2018-07-11 DIAGNOSIS — M25532 Pain in left wrist: Secondary | ICD-10-CM | POA: Diagnosis not present

## 2018-07-13 DIAGNOSIS — G5603 Carpal tunnel syndrome, bilateral upper limbs: Secondary | ICD-10-CM | POA: Insufficient documentation

## 2018-07-13 DIAGNOSIS — G5620 Lesion of ulnar nerve, unspecified upper limb: Secondary | ICD-10-CM | POA: Insufficient documentation

## 2018-10-08 DIAGNOSIS — M150 Primary generalized (osteo)arthritis: Secondary | ICD-10-CM | POA: Diagnosis not present

## 2018-10-08 DIAGNOSIS — M2550 Pain in unspecified joint: Secondary | ICD-10-CM | POA: Diagnosis not present

## 2018-10-29 ENCOUNTER — Other Ambulatory Visit (HOSPITAL_COMMUNITY): Payer: Self-pay | Admitting: Family Medicine

## 2018-10-29 DIAGNOSIS — R7309 Other abnormal glucose: Secondary | ICD-10-CM | POA: Diagnosis not present

## 2018-10-29 DIAGNOSIS — I10 Essential (primary) hypertension: Secondary | ICD-10-CM | POA: Diagnosis not present

## 2018-10-29 DIAGNOSIS — E7849 Other hyperlipidemia: Secondary | ICD-10-CM | POA: Diagnosis not present

## 2018-10-29 DIAGNOSIS — R519 Headache, unspecified: Secondary | ICD-10-CM

## 2018-10-29 DIAGNOSIS — Z0001 Encounter for general adult medical examination with abnormal findings: Secondary | ICD-10-CM | POA: Diagnosis not present

## 2018-10-29 DIAGNOSIS — R51 Headache: Principal | ICD-10-CM

## 2018-11-06 DIAGNOSIS — M5416 Radiculopathy, lumbar region: Secondary | ICD-10-CM | POA: Diagnosis not present

## 2018-11-06 DIAGNOSIS — Z96653 Presence of artificial knee joint, bilateral: Secondary | ICD-10-CM | POA: Diagnosis not present

## 2018-11-06 DIAGNOSIS — M25552 Pain in left hip: Secondary | ICD-10-CM | POA: Diagnosis not present

## 2018-11-06 DIAGNOSIS — Z471 Aftercare following joint replacement surgery: Secondary | ICD-10-CM | POA: Diagnosis not present

## 2018-12-11 ENCOUNTER — Other Ambulatory Visit: Payer: Self-pay

## 2018-12-11 ENCOUNTER — Ambulatory Visit (HOSPITAL_COMMUNITY)
Admission: RE | Admit: 2018-12-11 | Discharge: 2018-12-11 | Disposition: A | Discharge status: Home / Self Care | Payer: Medicare Other | Source: Ambulatory Visit | Attending: Family Medicine | Admitting: Family Medicine

## 2018-12-11 DIAGNOSIS — R51 Headache: Secondary | ICD-10-CM | POA: Insufficient documentation

## 2018-12-11 DIAGNOSIS — H538 Other visual disturbances: Secondary | ICD-10-CM | POA: Diagnosis not present

## 2018-12-11 DIAGNOSIS — R519 Headache, unspecified: Secondary | ICD-10-CM

## 2018-12-16 DIAGNOSIS — Z471 Aftercare following joint replacement surgery: Secondary | ICD-10-CM | POA: Diagnosis not present

## 2018-12-16 DIAGNOSIS — M542 Cervicalgia: Secondary | ICD-10-CM | POA: Diagnosis not present

## 2018-12-16 DIAGNOSIS — Z96643 Presence of artificial hip joint, bilateral: Secondary | ICD-10-CM | POA: Diagnosis not present

## 2018-12-16 DIAGNOSIS — Z96653 Presence of artificial knee joint, bilateral: Secondary | ICD-10-CM | POA: Diagnosis not present

## 2019-01-01 ENCOUNTER — Other Ambulatory Visit (HOSPITAL_COMMUNITY): Payer: Self-pay | Admitting: Family Medicine

## 2019-01-01 DIAGNOSIS — Z1231 Encounter for screening mammogram for malignant neoplasm of breast: Secondary | ICD-10-CM

## 2019-01-09 ENCOUNTER — Other Ambulatory Visit: Payer: Self-pay

## 2019-01-09 ENCOUNTER — Ambulatory Visit (HOSPITAL_COMMUNITY)
Admission: RE | Admit: 2019-01-09 | Discharge: 2019-01-09 | Disposition: A | Discharge status: Home / Self Care | Payer: Medicare Other | Source: Ambulatory Visit | Attending: Family Medicine | Admitting: Family Medicine

## 2019-01-09 DIAGNOSIS — Z1231 Encounter for screening mammogram for malignant neoplasm of breast: Secondary | ICD-10-CM

## 2019-01-15 ENCOUNTER — Other Ambulatory Visit: Payer: Medicare Other | Admitting: Adult Health

## 2019-02-04 DIAGNOSIS — I10 Essential (primary) hypertension: Secondary | ICD-10-CM | POA: Diagnosis not present

## 2019-02-04 DIAGNOSIS — E7849 Other hyperlipidemia: Secondary | ICD-10-CM | POA: Diagnosis not present

## 2019-02-04 DIAGNOSIS — R7309 Other abnormal glucose: Secondary | ICD-10-CM | POA: Diagnosis not present

## 2019-02-04 DIAGNOSIS — Z1389 Encounter for screening for other disorder: Secondary | ICD-10-CM | POA: Diagnosis not present

## 2019-02-11 ENCOUNTER — Encounter: Payer: Self-pay | Admitting: Adult Health

## 2019-03-03 ENCOUNTER — Other Ambulatory Visit: Payer: Medicare Other | Admitting: Adult Health

## 2019-03-21 DIAGNOSIS — M19071 Primary osteoarthritis, right ankle and foot: Secondary | ICD-10-CM | POA: Diagnosis not present

## 2019-03-21 DIAGNOSIS — M19072 Primary osteoarthritis, left ankle and foot: Secondary | ICD-10-CM | POA: Diagnosis not present

## 2019-03-21 DIAGNOSIS — M21962 Unspecified acquired deformity of left lower leg: Secondary | ICD-10-CM | POA: Diagnosis not present

## 2019-03-21 DIAGNOSIS — M21961 Unspecified acquired deformity of right lower leg: Secondary | ICD-10-CM | POA: Diagnosis not present

## 2019-04-04 DIAGNOSIS — M21961 Unspecified acquired deformity of right lower leg: Secondary | ICD-10-CM | POA: Diagnosis not present

## 2019-04-04 DIAGNOSIS — M19072 Primary osteoarthritis, left ankle and foot: Secondary | ICD-10-CM | POA: Diagnosis not present

## 2019-04-04 DIAGNOSIS — M19071 Primary osteoarthritis, right ankle and foot: Secondary | ICD-10-CM | POA: Diagnosis not present

## 2019-04-04 DIAGNOSIS — M21962 Unspecified acquired deformity of left lower leg: Secondary | ICD-10-CM | POA: Diagnosis not present

## 2019-04-24 DIAGNOSIS — M25532 Pain in left wrist: Secondary | ICD-10-CM | POA: Diagnosis not present

## 2019-04-24 DIAGNOSIS — M150 Primary generalized (osteo)arthritis: Secondary | ICD-10-CM | POA: Diagnosis not present

## 2019-04-24 DIAGNOSIS — M2550 Pain in unspecified joint: Secondary | ICD-10-CM | POA: Diagnosis not present

## 2019-05-12 ENCOUNTER — Other Ambulatory Visit: Payer: Medicare Other | Admitting: Adult Health

## 2019-06-09 DIAGNOSIS — E7849 Other hyperlipidemia: Secondary | ICD-10-CM | POA: Diagnosis not present

## 2019-06-09 DIAGNOSIS — I10 Essential (primary) hypertension: Secondary | ICD-10-CM | POA: Diagnosis not present

## 2019-06-09 DIAGNOSIS — M1991 Primary osteoarthritis, unspecified site: Secondary | ICD-10-CM | POA: Diagnosis not present

## 2019-07-10 DIAGNOSIS — M1991 Primary osteoarthritis, unspecified site: Secondary | ICD-10-CM | POA: Diagnosis not present

## 2019-07-10 DIAGNOSIS — I10 Essential (primary) hypertension: Secondary | ICD-10-CM | POA: Diagnosis not present

## 2019-07-10 DIAGNOSIS — E7849 Other hyperlipidemia: Secondary | ICD-10-CM | POA: Diagnosis not present

## 2019-07-28 ENCOUNTER — Other Ambulatory Visit: Payer: Medicare Other | Admitting: Adult Health

## 2019-08-10 DIAGNOSIS — I10 Essential (primary) hypertension: Secondary | ICD-10-CM | POA: Diagnosis not present

## 2019-08-10 DIAGNOSIS — M1991 Primary osteoarthritis, unspecified site: Secondary | ICD-10-CM | POA: Diagnosis not present

## 2019-08-10 DIAGNOSIS — E7849 Other hyperlipidemia: Secondary | ICD-10-CM | POA: Diagnosis not present

## 2019-09-11 ENCOUNTER — Other Ambulatory Visit: Payer: Self-pay

## 2019-09-11 ENCOUNTER — Encounter: Payer: Self-pay | Admitting: Adult Health

## 2019-09-11 ENCOUNTER — Ambulatory Visit (INDEPENDENT_AMBULATORY_CARE_PROVIDER_SITE_OTHER): Payer: Medicare Other | Admitting: Adult Health

## 2019-09-11 VITALS — BP 108/72 | HR 61 | Ht 61.0 in | Wt 235.0 lb

## 2019-09-11 DIAGNOSIS — Z01419 Encounter for gynecological examination (general) (routine) without abnormal findings: Secondary | ICD-10-CM | POA: Diagnosis not present

## 2019-09-11 DIAGNOSIS — Z1211 Encounter for screening for malignant neoplasm of colon: Secondary | ICD-10-CM | POA: Diagnosis not present

## 2019-09-11 DIAGNOSIS — N3941 Urge incontinence: Secondary | ICD-10-CM

## 2019-09-11 DIAGNOSIS — Z1212 Encounter for screening for malignant neoplasm of rectum: Secondary | ICD-10-CM | POA: Diagnosis not present

## 2019-09-11 LAB — HEMOCCULT GUIAC POC 1CARD (OFFICE): Fecal Occult Blood, POC: NEGATIVE

## 2019-09-11 NOTE — Progress Notes (Signed)
Patient ID: [name], female   DOB: 02-Apr-1951, 69 y.o.   MRN: [id number] History of Present Illness: [name] is a divorced black female, sp hysterectomy in for a well woman gyn exam. PCP is Dr Hilma Favors.   Current Medications, Allergies, Past Medical History, Past Surgical History, Family History and Social History were reviewed in [employer] record.     Review of Systems: Patient denies any headaches, hearing loss, fatigue, blurred vision, shortness of breath, chest pain, abdominal pain, problems with bowel movements, or intercourse(not active). No joint pain or mood swings. Has UI and myrbetriq helps   Physical Exam:BP 108/72 (BP Location: Left Arm, Patient Position: Sitting, Cuff Size: Normal)   Pulse 61   Ht 5\' 1"  (1.549 m)   Wt 235 lb (106.6 kg)   BMI 44.40 kg/m  General:  Well developed, well nourished, no acute distress Skin:  Warm and dry Neck:  Midline trachea, normal thyroid, good ROM, no lymphadenopathy,no carotid bruits heard Lungs; Clear to auscultation bilaterally Breast:  No dominant palpable mass, retraction, or nipple discharge Cardiovascular: Regular rate and rhythm Abdomen:  Soft, non tender, no hepatosplenomegaly Pelvic:  External genitalia is normal in appearance, no lesions.  The vagina is pale with loss of moisture and rugae. Urethra has no lesions or masses. The cervix and uterus are absent, no lesions seen.  No adnexal masses or tenderness noted.Bladder is non tender, no masses felt. Rectal: Good sphincter tone, no polyps, or hemorrhoids felt.  Hemoccult negative. Extremities/musculoskeletal:  No swelling or varicosities noted, no clubbing or cyanosis Psych:  No mood changes, alert and cooperative,seems happy Fall risk is low PHQ 2 score is 0 Examination chaperoned by Estill Bamberg Rash LPN  Impression and Plan: 1. Well woman exam with routine gynecological exam Physical in 2 years Mammogram yearly Labs with PCP  2. Screening for  colorectal cancer Colonoscopy per GI  3. Urge incontinence Given 56 tablets  Myrbetriq 50 mg 1 daily

## 2019-09-12 DIAGNOSIS — I10 Essential (primary) hypertension: Secondary | ICD-10-CM | POA: Diagnosis not present

## 2019-09-12 DIAGNOSIS — Z0000 Encounter for general adult medical examination without abnormal findings: Secondary | ICD-10-CM | POA: Diagnosis not present

## 2019-09-12 DIAGNOSIS — M1991 Primary osteoarthritis, unspecified site: Secondary | ICD-10-CM | POA: Diagnosis not present

## 2019-09-12 DIAGNOSIS — R7309 Other abnormal glucose: Secondary | ICD-10-CM | POA: Diagnosis not present

## 2019-09-12 DIAGNOSIS — E7849 Other hyperlipidemia: Secondary | ICD-10-CM | POA: Diagnosis not present

## 2019-09-12 DIAGNOSIS — Z1389 Encounter for screening for other disorder: Secondary | ICD-10-CM | POA: Diagnosis not present

## 2019-09-16 DIAGNOSIS — M25362 Other instability, left knee: Secondary | ICD-10-CM | POA: Diagnosis not present

## 2019-10-08 DIAGNOSIS — I10 Essential (primary) hypertension: Secondary | ICD-10-CM | POA: Diagnosis not present

## 2019-10-08 DIAGNOSIS — E7849 Other hyperlipidemia: Secondary | ICD-10-CM | POA: Diagnosis not present

## 2019-10-08 DIAGNOSIS — M1991 Primary osteoarthritis, unspecified site: Secondary | ICD-10-CM | POA: Diagnosis not present

## 2019-11-06 DIAGNOSIS — M21962 Unspecified acquired deformity of left lower leg: Secondary | ICD-10-CM | POA: Diagnosis not present

## 2019-11-06 DIAGNOSIS — M19072 Primary osteoarthritis, left ankle and foot: Secondary | ICD-10-CM | POA: Diagnosis not present

## 2019-11-06 DIAGNOSIS — M21961 Unspecified acquired deformity of right lower leg: Secondary | ICD-10-CM | POA: Diagnosis not present

## 2019-11-06 DIAGNOSIS — M19071 Primary osteoarthritis, right ankle and foot: Secondary | ICD-10-CM | POA: Diagnosis not present

## 2019-11-06 DIAGNOSIS — M65871 Other synovitis and tenosynovitis, right ankle and foot: Secondary | ICD-10-CM | POA: Diagnosis not present

## 2019-11-07 DIAGNOSIS — E7849 Other hyperlipidemia: Secondary | ICD-10-CM | POA: Diagnosis not present

## 2019-11-07 DIAGNOSIS — M1991 Primary osteoarthritis, unspecified site: Secondary | ICD-10-CM | POA: Diagnosis not present

## 2019-11-07 DIAGNOSIS — I10 Essential (primary) hypertension: Secondary | ICD-10-CM | POA: Diagnosis not present

## 2019-12-08 DIAGNOSIS — E7849 Other hyperlipidemia: Secondary | ICD-10-CM | POA: Diagnosis not present

## 2019-12-08 DIAGNOSIS — I10 Essential (primary) hypertension: Secondary | ICD-10-CM | POA: Diagnosis not present

## 2019-12-08 DIAGNOSIS — M1991 Primary osteoarthritis, unspecified site: Secondary | ICD-10-CM | POA: Diagnosis not present

## 2019-12-24 ENCOUNTER — Other Ambulatory Visit (HOSPITAL_COMMUNITY): Payer: Self-pay | Admitting: Family Medicine

## 2019-12-24 DIAGNOSIS — Z1231 Encounter for screening mammogram for malignant neoplasm of breast: Secondary | ICD-10-CM

## 2020-01-07 DIAGNOSIS — E7849 Other hyperlipidemia: Secondary | ICD-10-CM | POA: Diagnosis not present

## 2020-01-07 DIAGNOSIS — I10 Essential (primary) hypertension: Secondary | ICD-10-CM | POA: Diagnosis not present

## 2020-01-07 DIAGNOSIS — M1991 Primary osteoarthritis, unspecified site: Secondary | ICD-10-CM | POA: Diagnosis not present

## 2020-01-16 ENCOUNTER — Ambulatory Visit (HOSPITAL_COMMUNITY)
Admission: RE | Admit: 2020-01-16 | Discharge: 2020-01-16 | Disposition: A | Discharge status: Home / Self Care | Payer: Medicare Other | Source: Ambulatory Visit | Attending: Family Medicine | Admitting: Family Medicine

## 2020-01-16 ENCOUNTER — Other Ambulatory Visit: Payer: Self-pay

## 2020-01-16 DIAGNOSIS — Z1231 Encounter for screening mammogram for malignant neoplasm of breast: Secondary | ICD-10-CM | POA: Diagnosis not present

## 2020-01-21 DIAGNOSIS — Z79899 Other long term (current) drug therapy: Secondary | ICD-10-CM | POA: Diagnosis not present

## 2020-02-06 DIAGNOSIS — E7849 Other hyperlipidemia: Secondary | ICD-10-CM | POA: Diagnosis not present

## 2020-02-06 DIAGNOSIS — I10 Essential (primary) hypertension: Secondary | ICD-10-CM | POA: Diagnosis not present

## 2020-02-06 DIAGNOSIS — M1991 Primary osteoarthritis, unspecified site: Secondary | ICD-10-CM | POA: Diagnosis not present

## 2020-03-09 DIAGNOSIS — E7849 Other hyperlipidemia: Secondary | ICD-10-CM | POA: Diagnosis not present

## 2020-03-09 DIAGNOSIS — M1991 Primary osteoarthritis, unspecified site: Secondary | ICD-10-CM | POA: Diagnosis not present

## 2020-03-09 DIAGNOSIS — I10 Essential (primary) hypertension: Secondary | ICD-10-CM | POA: Diagnosis not present

## 2020-03-29 DIAGNOSIS — R7309 Other abnormal glucose: Secondary | ICD-10-CM | POA: Diagnosis not present

## 2020-03-29 DIAGNOSIS — Z23 Encounter for immunization: Secondary | ICD-10-CM | POA: Diagnosis not present

## 2020-05-08 DIAGNOSIS — I10 Essential (primary) hypertension: Secondary | ICD-10-CM | POA: Diagnosis not present

## 2020-05-08 DIAGNOSIS — M1991 Primary osteoarthritis, unspecified site: Secondary | ICD-10-CM | POA: Diagnosis not present

## 2020-05-08 DIAGNOSIS — E7849 Other hyperlipidemia: Secondary | ICD-10-CM | POA: Diagnosis not present

## 2020-05-20 ENCOUNTER — Ambulatory Visit: Payer: Medicare Other | Attending: Internal Medicine

## 2020-05-20 DIAGNOSIS — Z23 Encounter for immunization: Secondary | ICD-10-CM

## 2020-05-20 NOTE — Progress Notes (Signed)
   Covid-19 Vaccination Clinic  Name:  [name]    MRN: [id number] DOB: [birth year]  05/20/2020  [name] was observed post Covid-19 immunization for 15 minutes without incident. She was provided with Vaccine Information Sheet and instruction to access the V-Safe system.   [name] was instructed to call 911 with any severe reactions post vaccine: Marland Kitchen Difficulty breathing  . Swelling of face and throat  . A fast heartbeat  . A bad rash all over body  . Dizziness and weakness

## 2020-06-08 DIAGNOSIS — I10 Essential (primary) hypertension: Secondary | ICD-10-CM | POA: Diagnosis not present

## 2020-06-08 DIAGNOSIS — E7849 Other hyperlipidemia: Secondary | ICD-10-CM | POA: Diagnosis not present

## 2020-06-08 DIAGNOSIS — M1991 Primary osteoarthritis, unspecified site: Secondary | ICD-10-CM | POA: Diagnosis not present

## 2020-06-10 ENCOUNTER — Other Ambulatory Visit: Payer: Medicare Other

## 2020-06-10 DIAGNOSIS — Z20822 Contact with and (suspected) exposure to covid-19: Secondary | ICD-10-CM

## 2020-06-11 LAB — SARS-COV-2, NAA 2 DAY TAT

## 2020-06-11 LAB — NOVEL CORONAVIRUS, NAA: SARS-CoV-2, NAA: NOT DETECTED

## 2020-06-28 DIAGNOSIS — M150 Primary generalized (osteo)arthritis: Secondary | ICD-10-CM | POA: Diagnosis not present

## 2020-06-28 DIAGNOSIS — M1990 Unspecified osteoarthritis, unspecified site: Secondary | ICD-10-CM | POA: Diagnosis not present

## 2020-06-28 DIAGNOSIS — Z79899 Other long term (current) drug therapy: Secondary | ICD-10-CM | POA: Diagnosis not present

## 2020-06-28 DIAGNOSIS — M25532 Pain in left wrist: Secondary | ICD-10-CM | POA: Diagnosis not present

## 2020-07-13 ENCOUNTER — Ambulatory Visit: Payer: Medicare Other | Admitting: Cardiology

## 2020-08-05 ENCOUNTER — Other Ambulatory Visit: Payer: Medicare Other

## 2020-08-05 DIAGNOSIS — Z20822 Contact with and (suspected) exposure to covid-19: Secondary | ICD-10-CM

## 2020-08-06 LAB — SARS-COV-2, NAA 2 DAY TAT

## 2020-08-06 LAB — NOVEL CORONAVIRUS, NAA: SARS-CoV-2, NAA: NOT DETECTED

## 2020-08-25 DIAGNOSIS — R7309 Other abnormal glucose: Secondary | ICD-10-CM | POA: Diagnosis not present

## 2020-08-25 DIAGNOSIS — E7849 Other hyperlipidemia: Secondary | ICD-10-CM | POA: Diagnosis not present

## 2020-08-25 DIAGNOSIS — Z0001 Encounter for general adult medical examination with abnormal findings: Secondary | ICD-10-CM | POA: Diagnosis not present

## 2020-08-25 DIAGNOSIS — I10 Essential (primary) hypertension: Secondary | ICD-10-CM | POA: Diagnosis not present

## 2020-08-25 DIAGNOSIS — Z1389 Encounter for screening for other disorder: Secondary | ICD-10-CM | POA: Diagnosis not present

## 2020-08-31 ENCOUNTER — Ambulatory Visit: Payer: Medicare Other | Admitting: Cardiology

## 2020-08-31 ENCOUNTER — Other Ambulatory Visit: Payer: Self-pay

## 2020-08-31 ENCOUNTER — Encounter: Payer: Self-pay | Admitting: Cardiology

## 2020-08-31 VITALS — BP 120/84 | HR 72 | Ht 61.0 in | Wt 234.0 lb

## 2020-08-31 DIAGNOSIS — R011 Cardiac murmur, unspecified: Secondary | ICD-10-CM | POA: Diagnosis not present

## 2020-08-31 DIAGNOSIS — R0609 Other forms of dyspnea: Secondary | ICD-10-CM

## 2020-08-31 DIAGNOSIS — Z8249 Family history of ischemic heart disease and other diseases of the circulatory system: Secondary | ICD-10-CM

## 2020-08-31 DIAGNOSIS — I10 Essential (primary) hypertension: Secondary | ICD-10-CM | POA: Diagnosis not present

## 2020-08-31 DIAGNOSIS — R0600 Dyspnea, unspecified: Secondary | ICD-10-CM

## 2020-08-31 NOTE — Progress Notes (Signed)
Cardiology Office Note  Date: 08/31/2020   ID: Jennifer, [name] 14-Mar-1951, MRN [id number]  PCP:  Jennifer Sites, MD  Cardiologist:  Rozann Lesches, MD Electrophysiologist:  None   Chief Complaint  Patient presents with  . Shortness of Breath    History of Present Illness: [name] is a 70 y.o. female referred for cardiology consultation by Dr. Hilma Favors with request for stress test.  She has a history of hypertension and diet managed type 2 diabetes mellitus, also heart disease in her family.  Her mother died at age 89 with congestive heart failure and CAD.  She recalls undergoing a treadmill test greater than 20 years ago, no more recent ischemic assessment.  She states that she has noticed being more short of breath with activity, usually only when she is wearing a mask.  No exertional chest pain, no palpitations or syncope.  I personally reviewed her ECG today which shows sinus rhythm with possible biatrial enlargement.  I reviewed her medications which are outlined below.  Blood pressure is well controlled today on Prinzide.  She has a history of osteoarthritis and multiple joint replacements as noted below.  Past Medical History:  Diagnosis Date  . Anxiety   . Constipation   . Essential hypertension   . Headache   . Hyperlipidemia   . Insomnia   . Obesity   . Osteoarthritis   . Superficial fungus infection of skin 11/25/2015  . Type 2 diabetes mellitus ([place])   . Urge incontinence 11/04/2014  . Vertigo   . Vitamin D deficiency     Past Surgical History:  Procedure Laterality Date  . ABDOMINAL HYSTERECTOMY  1989  . CARPAL TUNNEL RELEASE Right 06/2016   right middle finger fused; wire in right thumb  . COLONOSCOPY  10/20/02   normal rectum/left-sided diverticula  . COLONOSCOPY  07/06/2011   Dr. Gala Romney: Dionisio David anal canal hemorrhoids, diminutive rectal and transverse colon polyps. 2 adenoma on path. Left-sided diverticulosis.  . COLONOSCOPY N/A 04/21/2016    Procedure: COLONOSCOPY;  Surgeon: Daneil Dolin, MD;  Location: [facility];  Service: Endoscopy;  Laterality: N/A;  930   . JOINT REPLACEMENT    . PARTIAL HYSTERECTOMY  1987  . TOE SURGERY     second toe on both feet, bunionectomy  . TOTAL HIP ARTHROPLASTY  2001/2008   multiple surgeries on right hip including replacements. Two replacements and four dislocations.   Marland Kitchen TOTAL HIP ARTHROPLASTY  2010   left  . TOTAL HIP REVISION  12/25/2011   Procedure: TOTAL HIP REVISION;  Surgeon: Mauri Pole, MD;  Location: [facility];  Service: Orthopedics;  Laterality: Right;  . TOTAL KNEE ARTHROPLASTY  2008   left  . TOTAL KNEE ARTHROPLASTY  09/04/2011   Procedure: TOTAL KNEE ARTHROPLASTY;  Surgeon: Mauri Pole, MD;  Location: [facility];  Service: Orthopedics;  Laterality: Right;  . TOTAL SHOULDER ARTHROPLASTY Left 10/08/2014   Procedure: LEFT TOTAL SHOULDER ARTHROPLASTY;  Surgeon: Justice Britain, MD;  Location: [facility];  Service: Orthopedics;  Laterality: Left;  . TOTAL SHOULDER REPLACEMENT Left 10/08/2014   dr supple    Current Outpatient Medications  Medication Sig Dispense Refill  . aspirin EC 81 MG tablet Take 81 mg by mouth daily. Swallow whole.    . Calcium Carbonate (CALTRATE 600 PO) Take by mouth daily.    Marland Kitchen escitalopram (LEXAPRO) 10 MG tablet Take 10 mg by mouth daily.    . hydroxychloroquine (PLAQUENIL) 200 MG tablet Take  200 mg by mouth daily.    Marland Kitchen lisinopril-hydrochlorothiazide (PRINZIDE,ZESTORETIC) 20-12.5 MG per tablet Take 1 tablet by mouth daily before breakfast.    . Multiple Vitamin (MULTIVITAMIN) tablet Take 1 tablet by mouth daily.    Marland Kitchen nystatin cream (MYCOSTATIN) APPLY TO AFFECTED AREAS TWICE DAILY. 30 g 1  . Omega-3 Fatty Acids (FISH OIL PO) Take by mouth daily.    . polyethylene glycol powder (GLYCOLAX/MIRALAX) 17 GM/SCOOP powder Take 1 Container by mouth daily.    Marland Kitchen zolpidem (AMBIEN) 10 MG tablet Take 1 tablet by mouth at bedtime as needed and may repeat dose one time if needed.      No current facility-administered medications for this visit.   Allergies:  Patient has no known allergies.   Social History: The patient  reports that she has never smoked. She has never used smokeless tobacco. She reports that she does not drink alcohol and does not use drugs.   Family History: The patient's family history includes Alcohol abuse in her father; Diabetes in her brother and mother; Early death in her father; GER disease in her brother; Heart attack in her daughter; Heart disease in her brother, maternal aunt, and maternal uncle; Heart failure in her daughter and mother; Hypertension in her brother, mother, sister, and sister; Pneumonia in her father; Sickle cell trait in her brother; Thyroid disease in her daughter.   ROS: No palpitations or syncope.  Physical Exam: VS:  BP 120/84   Pulse 72   Ht 5\' 1"  (1.549 m)   Wt 234 lb (106.1 kg)   SpO2 100%   BMI 44.21 kg/m , BMI Body mass index is 44.21 kg/m.  Wt Readings from Last 3 Encounters:  08/31/20 234 lb (106.1 kg)  09/11/19 235 lb (106.6 kg)  11/29/17 224 lb 8 oz (101.8 kg)    General: Patient appears comfortable at rest. HEENT: Conjunctiva and lids normal, wearing a mask. Neck: Supple, no elevated JVP or carotid bruits, no thyromegaly. Lungs: Clear to auscultation, nonlabored breathing at rest. Cardiac: Regular rate and rhythm, no S3, soft systolic murmur, no pericardial rub. Abdomen: Soft, nontender, bowel sounds present. Extremities: No pitting edema, distal pulses 2+.  Arthritic joint deformities. Skin: Warm and dry. Musculoskeletal: No kyphosis. Neuropsychiatric: Alert and oriented x3, affect grossly appropriate.  ECG:  An ECG dated 08/31/2015 was personally reviewed today and demonstrated:  Sinus bradycardia.  Recent Labwork:    Component Value Date/Time   CHOL 201 (H) 11/25/2015 1429   TRIG 57 11/25/2015 1429   HDL 63 11/25/2015 1429   CHOLHDL 3.2 11/25/2015 1429   CHOLHDL 3.5 10/31/2013 1044    VLDL 14 10/31/2013 1044   LDLCALC 127 (H) 11/25/2015 1429  September 2021: Hemoglobin A1c 5.6%  Other Studies Reviewed Today:  No prior cardiac testing for review.  Assessment and Plan:  1.  Dyspnea on exertion in a 70 year old woman with hypertension, diet managed type 2 diabetes mellitus, and family history of heart disease in her mother.  ECG reviewed and nonspecific.  She has not undergone any recent ischemic evaluation.  With multiple joint replacements, not optimal candidate for treadmill.  We will proceed with a [place] for ischemic evaluation.  2.  Heart murmur, soft in aortic position.  Obtain echocardiogram.  3.  Essential hypertension, blood pressure well controlled on Prinzide.  4.  Diet managed type 2 diabetes mellitus, followed by Dr. Hilma Favors.  Last hemoglobin A1c 5.6%.  Medication Adjustments/Labs and Tests Ordered: Current medicines are reviewed at length with  the patient today.  Concerns regarding medicines are outlined above.   Tests Ordered: Orders Placed This Encounter  Procedures  . NM Myocar Multi W/Spect W/Wall Motion / EF  . EKG 12-Lead  . ECHOCARDIOGRAM COMPLETE    Medication Changes: No orders of the defined types were placed in this encounter.   Disposition:  Follow up test results.  Signed, Satira Sark, MD, [facility] 08/31/2020 2:55 PM    [place] [facility] [address], [place], [place] [address] Phone: [phone]; Fax: [phone]

## 2020-08-31 NOTE — Patient Instructions (Signed)
Medication Instructions:  Your physician recommends that you continue on your current medications as directed. Please refer to the Current Medication list given to you today.  *If you need a refill on your cardiac medications before your next appointment, please call your pharmacy*   Lab Work: None today  If you have labs (blood work) drawn today and your tests are completely normal, you will receive your results only by: MyChart Message (if you have MyChart) OR A paper copy in the mail If you have any lab test that is abnormal or we need to change your treatment, we will call you to review the results.   Testing/Procedures: Your physician has requested that you have an echocardiogram. Echocardiography is a painless test that uses sound waves to create images of your heart. It provides your doctor with information about the size and shape of your heart and how well your heart's chambers and valves are working. This procedure takes approximately one hour. There are no restrictions for this procedure.   Your physician has requested that you have a lexiscan myoview. For further information please visit [email or web address]. Please follow instruction sheet, as given.    Follow-Up: At [facility], you and your health needs are our priority.  As part of our continuing mission to provide you with exceptional heart care, we have created designated Provider Care Teams.  These Care Teams include your primary Cardiologist (physician) and Advanced Practice Providers (APPs -  Physician Assistants and Nurse Practitioners) who all work together to provide you with the care you need, when you need it.  We recommend signing up for the patient portal called "MyChart".  Sign up information is provided on this After Visit Summary.  MyChart is used to connect with patients for Virtual Visits (Telemedicine).  Patients are able to view lab/test results, encounter notes, upcoming appointments, etc.  Non-urgent  messages can be sent to your provider as well.   To learn more about what you can do with MyChart, go to [email or web address].    Your next appointment:  We will call you with test results          Thank you for choosing [facility] !       

## 2020-09-06 DIAGNOSIS — Z96641 Presence of right artificial hip joint: Secondary | ICD-10-CM | POA: Diagnosis not present

## 2020-09-06 DIAGNOSIS — Z96653 Presence of artificial knee joint, bilateral: Secondary | ICD-10-CM | POA: Diagnosis not present

## 2020-09-06 DIAGNOSIS — Z96642 Presence of left artificial hip joint: Secondary | ICD-10-CM | POA: Diagnosis not present

## 2020-09-09 ENCOUNTER — Encounter (HOSPITAL_COMMUNITY): Payer: Medicare Other

## 2020-09-15 ENCOUNTER — Other Ambulatory Visit: Payer: Self-pay | Admitting: Orthopedic Surgery

## 2020-09-16 ENCOUNTER — Ambulatory Visit (HOSPITAL_COMMUNITY)
Admission: RE | Admit: 2020-09-16 | Discharge: 2020-09-16 | Disposition: A | Discharge status: Home / Self Care | Payer: Medicare Other | Source: Ambulatory Visit | Attending: Cardiology | Admitting: Cardiology

## 2020-09-16 ENCOUNTER — Encounter (HOSPITAL_COMMUNITY)
Admission: RE | Admit: 2020-09-16 | Discharge: 2020-09-16 | Disposition: A | Discharge status: Home / Self Care | Payer: Medicare Other | Source: Ambulatory Visit | Attending: Cardiology | Admitting: Cardiology

## 2020-09-16 ENCOUNTER — Other Ambulatory Visit (HOSPITAL_COMMUNITY): Payer: Self-pay | Admitting: Orthopedic Surgery

## 2020-09-16 ENCOUNTER — Other Ambulatory Visit: Payer: Self-pay

## 2020-09-16 ENCOUNTER — Encounter (HOSPITAL_BASED_OUTPATIENT_CLINIC_OR_DEPARTMENT_OTHER)
Admission: RE | Admit: 2020-09-16 | Discharge: 2020-09-16 | Disposition: A | Discharge status: Home / Self Care | Payer: Medicare Other | Source: Ambulatory Visit | Attending: Cardiology | Admitting: Cardiology

## 2020-09-16 DIAGNOSIS — Z96653 Presence of artificial knee joint, bilateral: Secondary | ICD-10-CM

## 2020-09-16 DIAGNOSIS — R0600 Dyspnea, unspecified: Secondary | ICD-10-CM

## 2020-09-16 DIAGNOSIS — R011 Cardiac murmur, unspecified: Secondary | ICD-10-CM | POA: Insufficient documentation

## 2020-09-16 DIAGNOSIS — R0609 Other forms of dyspnea: Secondary | ICD-10-CM

## 2020-09-16 LAB — NM MYOCAR MULTI W/SPECT W/WALL MOTION / EF
LV dias vol: 64 mL (ref 46–106)
LV sys vol: 23 mL
Peak HR: 92 {beats}/min
RATE: 0.25
Rest HR: 61 {beats}/min
SDS: 0
SRS: 4
SSS: 4
TID: 0.91

## 2020-09-16 LAB — ECHOCARDIOGRAM COMPLETE
Area-P 1/2: 2.42 cm2
S' Lateral: 2.6 cm

## 2020-09-16 MED ORDER — TECHNETIUM TC 99M TETROFOSMIN IV KIT
10.0000 | PACK | Freq: Once | INTRAVENOUS | Status: AC | PRN
  Administered 2020-09-16: 11 via INTRAVENOUS

## 2020-09-16 MED ORDER — REGADENOSON 0.4 MG/5ML IV SOLN
INTRAVENOUS | Status: AC
  Administered 2020-09-16: 0.4 mg via INTRAVENOUS
  Filled 2020-09-16: qty 5

## 2020-09-16 MED ORDER — SODIUM CHLORIDE FLUSH 0.9 % IV SOLN
INTRAVENOUS | Status: AC
  Administered 2020-09-16: 10 mL via INTRAVENOUS
  Filled 2020-09-16: qty 10

## 2020-09-16 MED ORDER — TECHNETIUM TC 99M TETROFOSMIN IV KIT
30.0000 | PACK | Freq: Once | INTRAVENOUS | Status: AC | PRN
  Administered 2020-09-16: 30.8 via INTRAVENOUS

## 2020-09-16 NOTE — Progress Notes (Signed)
*  PRELIMINARY RESULTS* Echocardiogram 2D Echocardiogram has been performed.  Samuel Germany 09/16/2020, 12:39 PM

## 2020-09-17 ENCOUNTER — Telehealth: Payer: Self-pay

## 2020-09-17 NOTE — Telephone Encounter (Signed)
Patient contacted and verbalized understanding. Patient will follow up with PCP. Patient had no questions or concerns at this time.

## 2020-09-17 NOTE — Telephone Encounter (Signed)
-----   Message from Satira Sark, MD sent at 09/16/2020  4:15 PM EST ----- Results reviewed.  Stress test is low risk, suspected soft tissue attenuation and no significant ischemia to suggest obstructive CAD at this point.  Would focus on risk factor modification and medical therapy with PCP.  Follow-up echocardiogram result.

## 2020-09-17 NOTE — Telephone Encounter (Signed)
-----   Message from Satira Sark, MD sent at 09/16/2020  4:53 PM EST ----- Results reviewed.  Echocardiogram also demonstrates normal LVEF at 60 to 65%, mildly calcified aortic valve that is nonstenotic with only trivial aortic regurgitation.  In light of this and recent stress testing, no further cardiac testing is planned at this point.  Would keep follow-up with PCP

## 2020-10-04 ENCOUNTER — Encounter (HOSPITAL_COMMUNITY)
Admission: RE | Admit: 2020-10-04 | Discharge: 2020-10-04 | Disposition: A | Discharge status: Home / Self Care | Payer: Medicare Other | Source: Ambulatory Visit | Attending: Orthopedic Surgery | Admitting: Orthopedic Surgery

## 2020-10-04 DIAGNOSIS — M25562 Pain in left knee: Secondary | ICD-10-CM | POA: Diagnosis not present

## 2020-10-04 DIAGNOSIS — Z96653 Presence of artificial knee joint, bilateral: Secondary | ICD-10-CM | POA: Diagnosis not present

## 2020-10-04 MED ORDER — TECHNETIUM TC 99M MEDRONATE IV KIT
20.0000 | PACK | Freq: Once | INTRAVENOUS | Status: AC | PRN
  Administered 2020-10-04: 19.2 via INTRAVENOUS

## 2020-10-06 DIAGNOSIS — M25562 Pain in left knee: Secondary | ICD-10-CM | POA: Diagnosis not present

## 2020-10-06 DIAGNOSIS — M1991 Primary osteoarthritis, unspecified site: Secondary | ICD-10-CM | POA: Diagnosis not present

## 2020-10-06 DIAGNOSIS — T8484XA Pain due to internal orthopedic prosthetic devices, implants and grafts, initial encounter: Secondary | ICD-10-CM | POA: Diagnosis not present

## 2020-10-06 DIAGNOSIS — E7849 Other hyperlipidemia: Secondary | ICD-10-CM | POA: Diagnosis not present

## 2020-10-06 DIAGNOSIS — I10 Essential (primary) hypertension: Secondary | ICD-10-CM | POA: Diagnosis not present

## 2020-10-06 DIAGNOSIS — Z96652 Presence of left artificial knee joint: Secondary | ICD-10-CM | POA: Diagnosis not present

## 2020-11-06 DIAGNOSIS — E7849 Other hyperlipidemia: Secondary | ICD-10-CM | POA: Diagnosis not present

## 2020-11-06 DIAGNOSIS — M1991 Primary osteoarthritis, unspecified site: Secondary | ICD-10-CM | POA: Diagnosis not present

## 2020-11-06 DIAGNOSIS — I10 Essential (primary) hypertension: Secondary | ICD-10-CM | POA: Diagnosis not present

## 2021-01-20 ENCOUNTER — Other Ambulatory Visit (HOSPITAL_COMMUNITY): Payer: Self-pay | Admitting: Family Medicine

## 2021-01-20 DIAGNOSIS — Z1231 Encounter for screening mammogram for malignant neoplasm of breast: Secondary | ICD-10-CM

## 2021-01-26 ENCOUNTER — Ambulatory Visit (HOSPITAL_COMMUNITY)
Admission: RE | Admit: 2021-01-26 | Discharge: 2021-01-26 | Disposition: A | Discharge status: Home / Self Care | Payer: Medicare Other | Source: Ambulatory Visit | Attending: Family Medicine | Admitting: Family Medicine

## 2021-01-26 ENCOUNTER — Other Ambulatory Visit: Payer: Self-pay

## 2021-01-26 DIAGNOSIS — Z1231 Encounter for screening mammogram for malignant neoplasm of breast: Secondary | ICD-10-CM | POA: Diagnosis not present

## 2021-02-08 DIAGNOSIS — Z01810 Encounter for preprocedural cardiovascular examination: Secondary | ICD-10-CM | POA: Diagnosis not present

## 2021-02-08 DIAGNOSIS — Z7984 Long term (current) use of oral hypoglycemic drugs: Secondary | ICD-10-CM | POA: Diagnosis not present

## 2021-02-08 DIAGNOSIS — M1991 Primary osteoarthritis, unspecified site: Secondary | ICD-10-CM | POA: Diagnosis not present

## 2021-02-08 DIAGNOSIS — E782 Mixed hyperlipidemia: Secondary | ICD-10-CM | POA: Diagnosis not present

## 2021-02-08 DIAGNOSIS — R7309 Other abnormal glucose: Secondary | ICD-10-CM | POA: Diagnosis not present

## 2021-02-08 DIAGNOSIS — I10 Essential (primary) hypertension: Secondary | ICD-10-CM | POA: Diagnosis not present

## 2021-02-25 IMAGING — MG DIGITAL SCREENING BILATERAL MAMMOGRAM WITH TOMO AND CAD
6 of 12 series · 6 of 36 positions shown · non-contrast
Comparison: Previous exam(s).

CLINICAL DATA: Screening.

EXAM:
DIGITAL SCREENING BILATERAL MAMMOGRAM WITH TOMO AND CAD

[R CC synth-2D (1 of 2)]
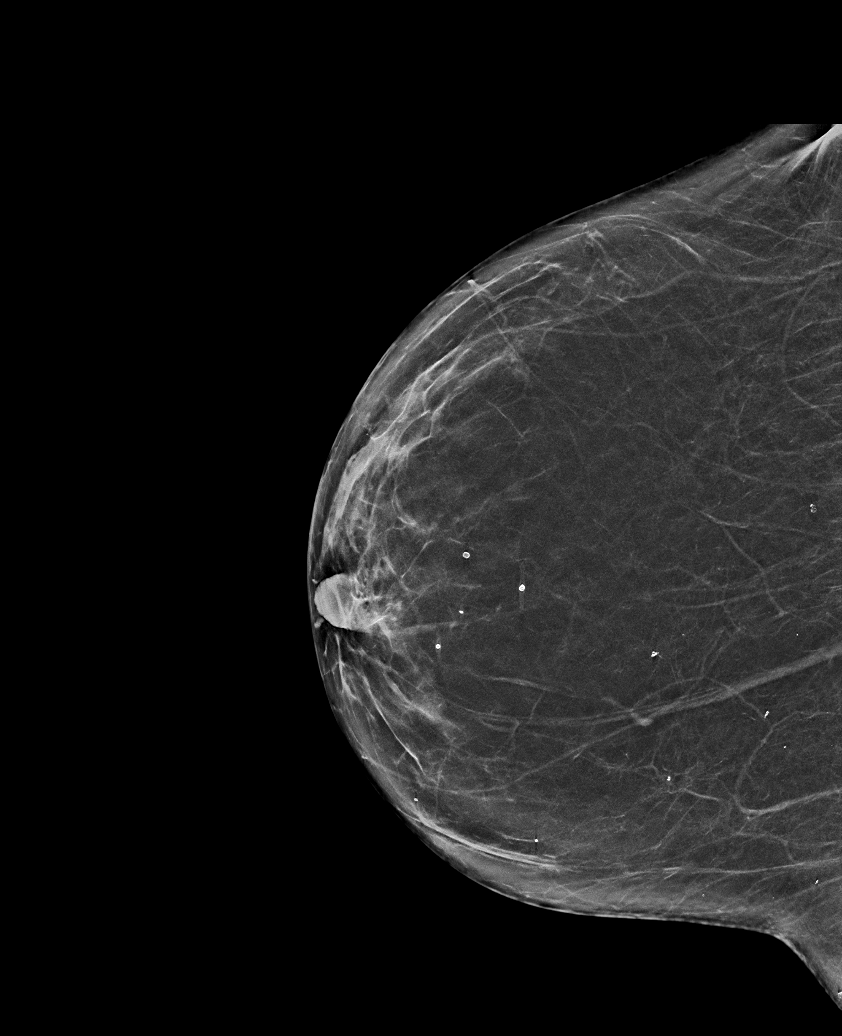

[L CC synth-2D (1 of 2)]
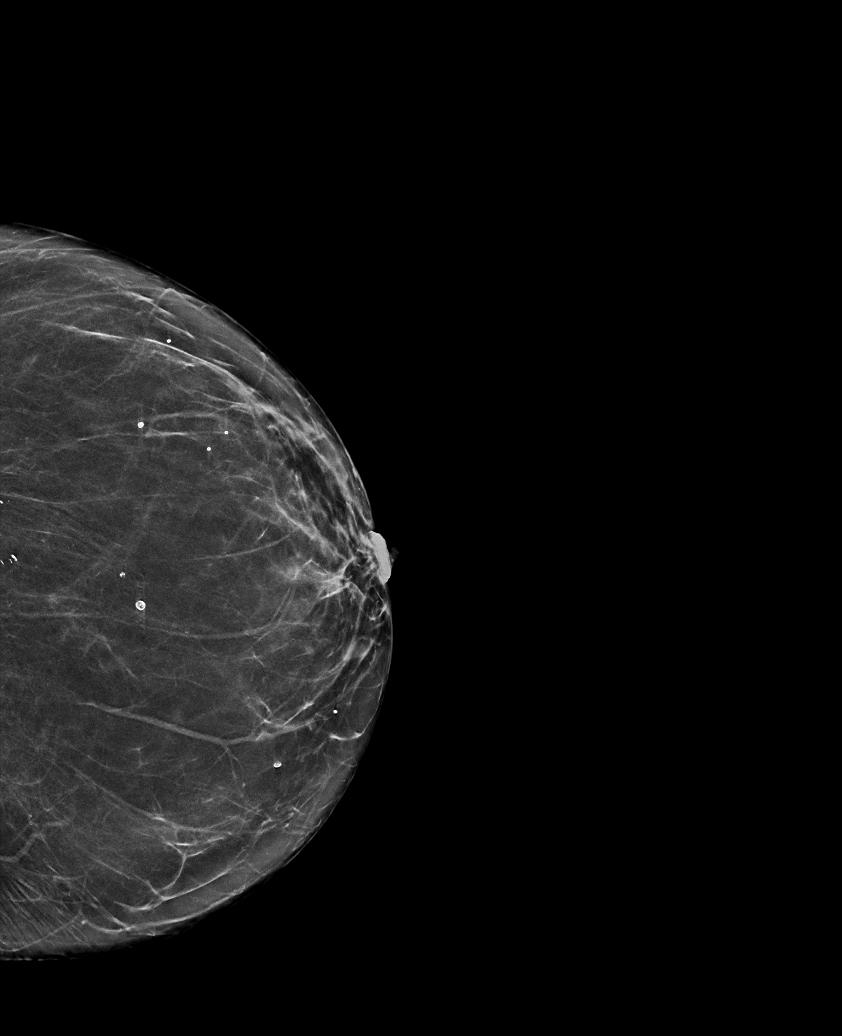

[R MLO synth-2D]
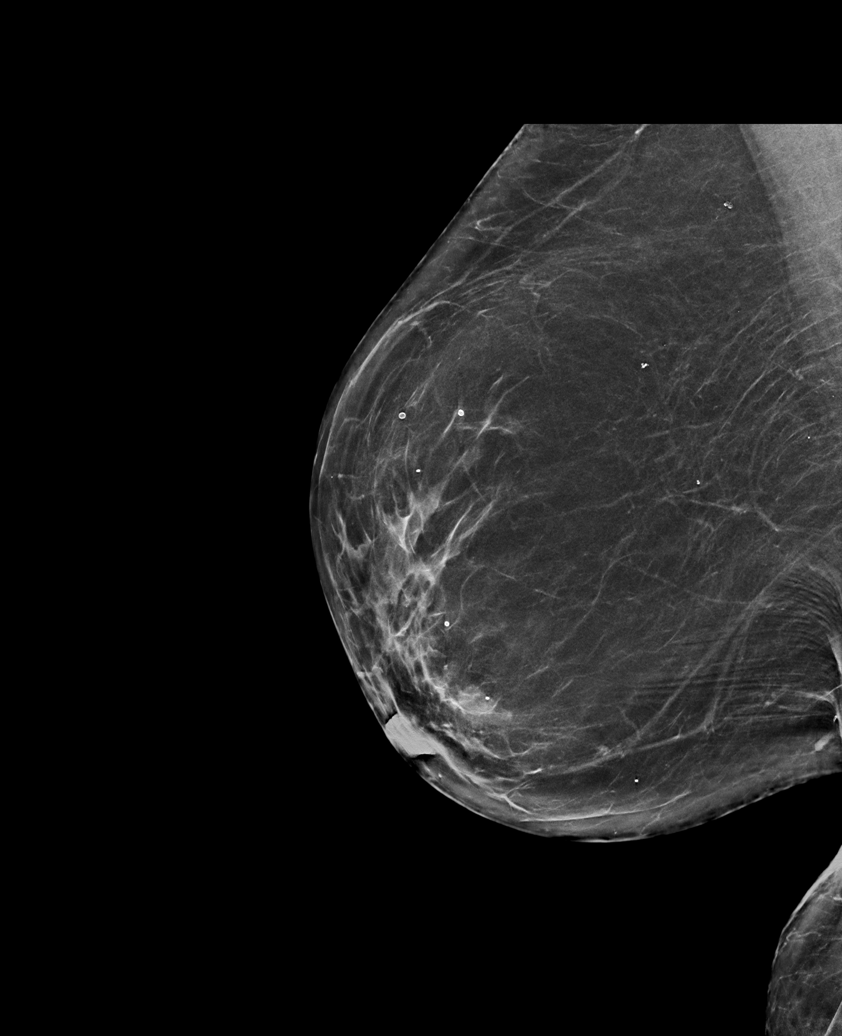

[L MLO synth-2D]
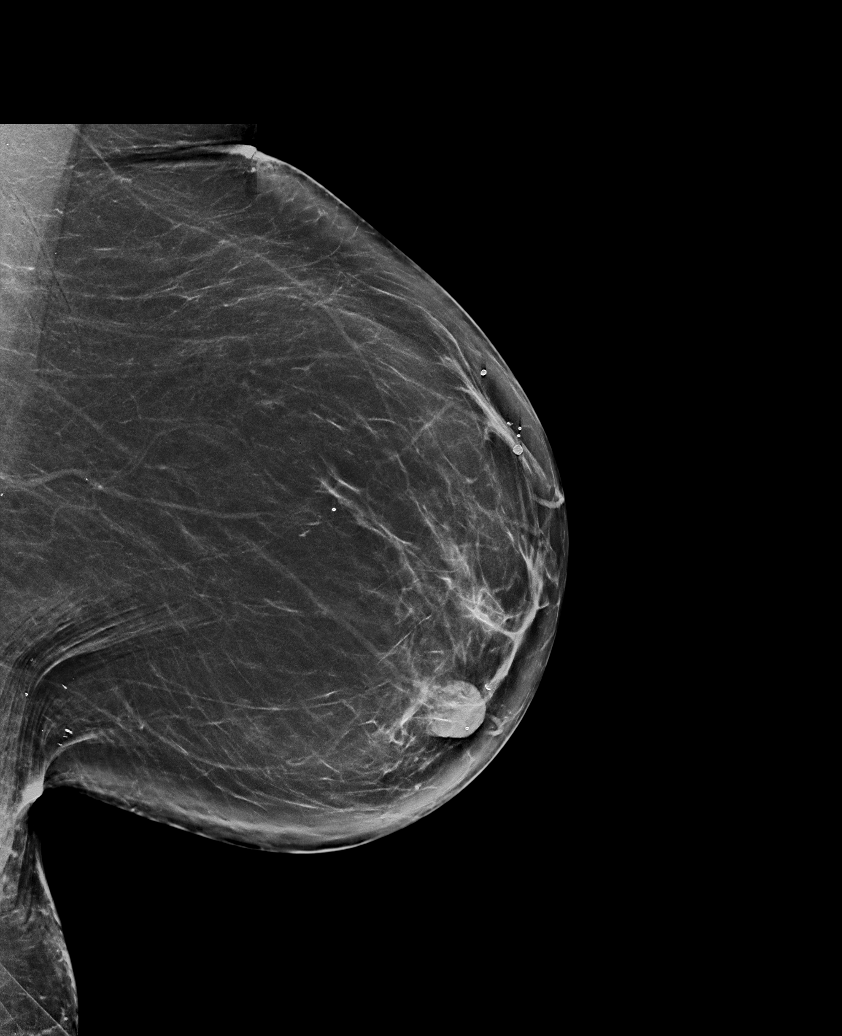

[L CC synth-2D (2 of 2)]
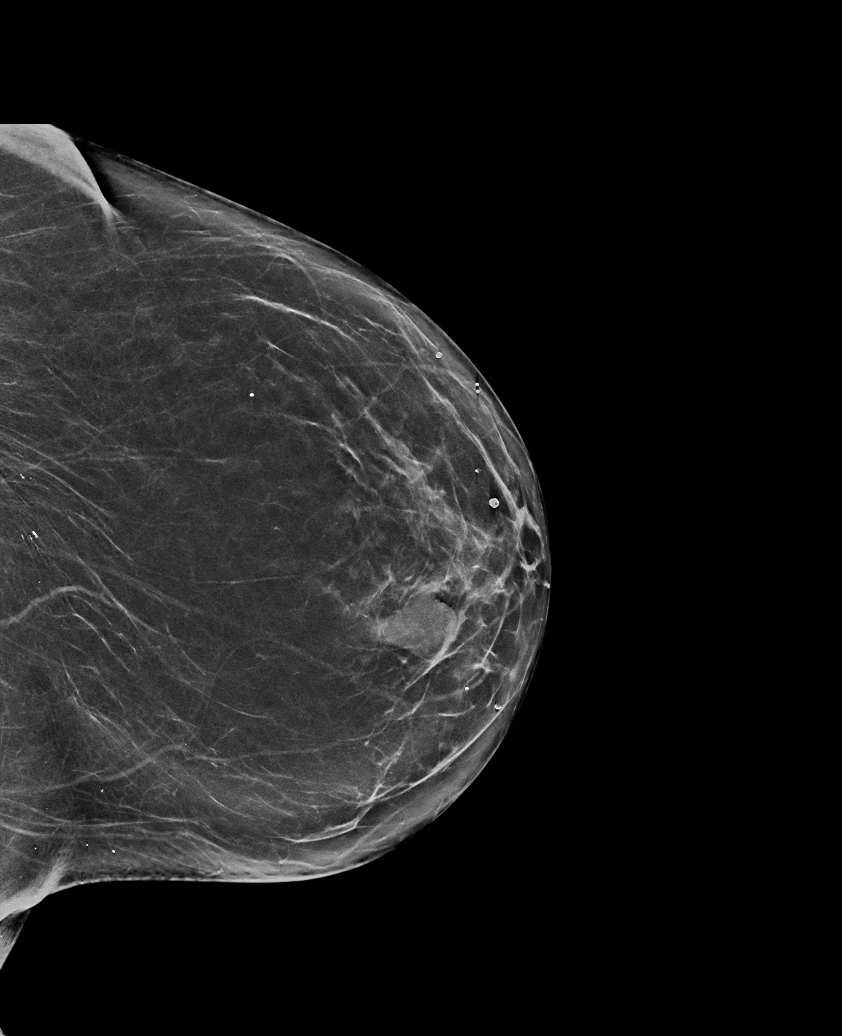

[R CC synth-2D (2 of 2)]
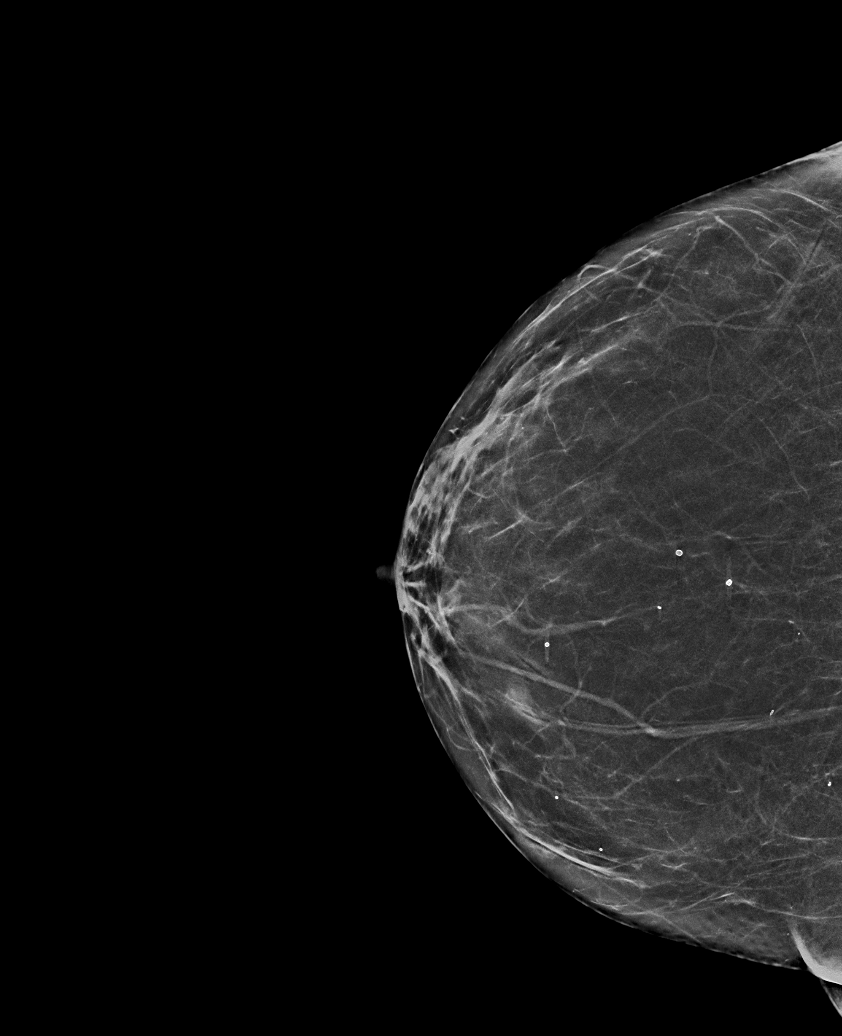

[6 of 36 positions shown; findings below may reference images not displayed]

ACR Breast Density Category b: There are scattered areas of
fibroglandular density.
FINDINGS: There are no findings suspicious for malignancy. Images were
processed with CAD.
IMPRESSION: No mammographic evidence of malignancy. A result letter of this
screening mammogram will be mailed directly to the patient.

RECOMMENDATION:
Screening mammogram in one year. (Code:[id number])

BI-RADS CATEGORY  1: Negative.

## 2021-03-08 DIAGNOSIS — M25532 Pain in left wrist: Secondary | ICD-10-CM | POA: Diagnosis not present

## 2021-03-08 DIAGNOSIS — M1990 Unspecified osteoarthritis, unspecified site: Secondary | ICD-10-CM | POA: Diagnosis not present

## 2021-03-08 DIAGNOSIS — Z79899 Other long term (current) drug therapy: Secondary | ICD-10-CM | POA: Diagnosis not present

## 2021-03-08 DIAGNOSIS — M150 Primary generalized (osteo)arthritis: Secondary | ICD-10-CM | POA: Diagnosis not present

## 2021-03-09 NOTE — Patient Instructions (Addendum)
DUE TO COVID-19 ONLY ONE VISITOR IS ALLOWED TO COME WITH YOU AND STAY IN THE WAITING ROOM ONLY DURING PRE OP AND PROCEDURE.   **NO VISITORS ARE ALLOWED IN THE SHORT STAY AREA OR RECOVERY ROOM!!**  IF YOU WILL BE ADMITTED INTO THE HOSPITAL YOU ARE ALLOWED ONLY TWO SUPPORT PEOPLE DURING VISITATION HOURS ONLY (10AM -8PM)   The support person(s) may change daily. The support person(s) must pass our screening, gel in and out, and wear a mask at all times, including in the patient's room. Patients must also wear a mask when staff or their support person are in the room.  No visitors under the age of 40. Any visitor under the age of 51 must be accompanied by an adult.    COVID SWAB TESTING MUST BE COMPLETED ON:  03/18/21 **MUST PRESENT COMPLETED FORM AT TESTING SITE**    [facility] [place] (backside of the building) Open 8am-3pm. No appointment needed You are not required to quarantine, however you are required to wear a well-fitted mask when you are out and around people not in your household.  Hand Hygiene often Do NOT share personal items Notify your provider if you are in close contact with someone who has COVID or you develop fever 100.4 or greater, new onset of sneezing, cough, sore throat, shortness of breath or body aches.       Your procedure is scheduled on: 03/22/21   Report to [facility] Main  Entrance    Report to admitting at 9:30 AM   Call this number if you have problems the morning of surgery [phone]   Do not eat food :After Midnight.   May have liquids until  9:15 AM  day of surgery  CLEAR LIQUID DIET  Foods Allowed                                                                     Foods Excluded  Water, Black Coffee and tea (No milk or creamer)           liquids that you cannot  Plain Jell-O in any flavor  (No red)                                    see through such as: Fruit ices (not with fruit pulp)                                             milk, soups, orange juice              Iced Popsicles (No red)                                                All solid food  Apple juices Sports drinks like Gatorade (No red) Lightly seasoned clear broth or consume(fat free) Sugar, honey syrup     The day of surgery:  Drink ONE (1) Pre-Surgery G2 by 9:15 am the morning of surgery. Drink in one sitting. Do not sip.  This drink was given to you during your hospital  pre-op appointment visit. Nothing else to drink after completing the  Pre-Surgery G2.          If you have questions, please contact your surgeon's office.     Oral Hygiene is also important to reduce your risk of infection.                                    Remember - BRUSH YOUR TEETH THE MORNING OF SURGERY WITH YOUR REGULAR TOOTHPASTE    Take these medicines the morning of surgery with A SIP OF WATER: Hydroxychloroquine                              You may not have any metal on your body including hair pins, jewelry, and body piercing             Do not wear make-up, lotions, powders, perfumes, or deodorant  Do not wear nail polish including gel and S&S, artificial/acrylic nails, or any other type of covering on natural nails including finger and toenails. If you have artificial nails, gel coating, etc. that needs to be removed by a nail salon please have this removed prior to surgery or surgery may need to be canceled/ delayed if the surgeon/ anesthesia feels like they are unable to be safely monitored.   Do not shave  48 hours prior to surgery.    Do not bring valuables to the hospital. [place].   Bring small overnight bag day of surgery.   Special Instructions: Bring a copy of your healthcare power of attorney and living will documents         the day of surgery if you haven't scanned them in before.  Please read over the following fact sheets you were given: IF YOU HAVE  QUESTIONS ABOUT YOUR PRE OP INSTRUCTIONS PLEASE CALL [facility] - Preparing for Surgery Before surgery, you can play an important role.  Because skin is not sterile, your skin needs to be as free of germs as possible.  You can reduce the number of germs on your skin by washing with CHG (chlorahexidine gluconate) soap before surgery.  CHG is an antiseptic cleaner which kills germs and bonds with the skin to continue killing germs even after washing. Please DO NOT use if you have an allergy to CHG or antibacterial soaps.  If your skin becomes reddened/irritated stop using the CHG and inform your nurse when you arrive at Short Stay. Do not shave (including legs and underarms) for at least 48 hours prior to the first CHG shower.  You may shave your face/neck.  Please follow these instructions carefully:  1.  Shower with CHG Soap the night before surgery and the  morning of surgery.  2.  If you choose to wash your hair, wash your hair first as usual with your normal  shampoo.  3.  After you shampoo, rinse your hair and  body thoroughly to remove the shampoo.                             4.  Use CHG as you would any other liquid soap.  You can apply chg directly to the skin and wash.  Gently with a scrungie or clean washcloth.  5.  Apply the CHG Soap to your body ONLY FROM THE NECK DOWN.   Do   not use on face/ open                           Wound or open sores. Avoid contact with eyes, ears mouth and   genitals (private parts).                       Wash face,  Genitals (private parts) with your normal soap.             6.  Wash thoroughly, paying special attention to the area where your    surgery  will be performed.  7.  Thoroughly rinse your body with warm water from the neck down.  8.  DO NOT shower/wash with your normal soap after using and rinsing off the CHG Soap.                9.  Pat yourself dry with a clean towel.            10.  Wear clean pajamas.            11.   Place clean sheets on your bed the night of your first shower and do not  sleep with pets. Day of Surgery : Do not apply any lotions/deodorants the morning of surgery.  Please wear clean clothes to the hospital/surgery center.  FAILURE TO FOLLOW THESE INSTRUCTIONS MAY RESULT IN THE CANCELLATION OF YOUR SURGERY  PATIENT SIGNATURE_________________________________  NURSE SIGNATURE__________________________________  ________________________________________________________________________   [name]  An incentive spirometer is a tool that can help keep your lungs clear and active. This tool measures how well you are filling your lungs with each breath. Taking long deep breaths may help reverse or decrease the chance of developing breathing (pulmonary) problems (especially infection) following: A long period of time when you are unable to move or be active. BEFORE THE PROCEDURE  If the spirometer includes an indicator to show your best effort, your nurse or respiratory therapist will set it to a desired goal. If possible, sit up straight or lean slightly forward. Try not to slouch. Hold the incentive spirometer in an upright position. INSTRUCTIONS FOR USE  Sit on the edge of your bed if possible, or sit up as far as you can in bed or on a chair. Hold the incentive spirometer in an upright position. Breathe out normally. Place the mouthpiece in your mouth and seal your lips tightly around it. Breathe in slowly and as deeply as possible, raising the piston or the ball toward the top of the column. Hold your breath for 3-5 seconds or for as long as possible. Allow the piston or ball to fall to the bottom of the column. Remove the mouthpiece from your mouth and breathe out normally. Rest for a few seconds and repeat Steps 1 through 7 at least 10 times every 1-2 hours when you are awake. Take your time and take a few normal breaths between deep breaths. The spirometer  may include an  indicator to show your best effort. Use the indicator as a goal to work toward during each repetition. After each set of 10 deep breaths, practice coughing to be sure your lungs are clear. If you have an incision (the cut made at the time of surgery), support your incision when coughing by placing a pillow or rolled up towels firmly against it. Once you are able to get out of bed, walk around indoors and cough well. You may stop using the incentive spirometer when instructed by your caregiver.  RISKS AND COMPLICATIONS Take your time so you do not get dizzy or light-headed. If you are in pain, you may need to take or ask for pain medication before doing incentive spirometry. It is harder to take a deep breath if you are having pain. AFTER USE Rest and breathe slowly and easily. It can be helpful to keep track of a log of your progress. Your caregiver can provide you with a simple table to help with this. If you are using the spirometer at home, follow these instructions: Reserve IF:  You are having difficultly using the spirometer. You have trouble using the spirometer as often as instructed. Your pain medication is not giving enough relief while using the spirometer. You develop fever of 100.5 F (38.1 C) or higher. SEEK IMMEDIATE MEDICAL CARE IF:  You cough up bloody sputum that had not been present before. You develop fever of 102 F (38.9 C) or greater. You develop worsening pain at or near the incision site. MAKE SURE YOU:  Understand these instructions. Will watch your condition. Will get help right away if you are not doing well or get worse. Document Released: 11/06/2006 Document Revised: 09/18/2011 Document Reviewed: 01/07/2007 Exit[employer] Patient Information 2014 [employer], [place].   ________________________________________________________________________  WHAT IS A BLOOD TRANSFUSION? Blood Transfusion Information  A transfusion is the replacement of blood or some of its  parts. Blood is made up of multiple cells which provide different functions. Red blood cells carry oxygen and are used for blood loss replacement. White blood cells fight against infection. Platelets control bleeding. Plasma helps clot blood. Other blood products are available for specialized needs, such as hemophilia or other clotting disorders. BEFORE THE TRANSFUSION  Who gives blood for transfusions?  Healthy volunteers who are fully evaluated to make sure their blood is safe. This is blood bank blood. Transfusion therapy is the safest it has ever been in the practice of medicine. Before blood is taken from a donor, a complete history is taken to make sure that person has no history of diseases nor engages in risky social behavior (examples are intravenous drug use or sexual activity with multiple partners). The donor's travel history is screened to minimize risk of transmitting infections, such as malaria. The donated blood is tested for signs of infectious diseases, such as HIV and hepatitis. The blood is then tested to be sure it is compatible with you in order to minimize the chance of a transfusion reaction. If you or a relative donates blood, this is often done in anticipation of surgery and is not appropriate for emergency situations. It takes many days to process the donated blood. RISKS AND COMPLICATIONS Although transfusion therapy is very safe and saves many lives, the main dangers of transfusion include:  Getting an infectious disease. Developing a transfusion reaction. This is an allergic reaction to something in the blood you were given. Every precaution is taken to prevent this. The decision to  have a blood transfusion has been considered carefully by your caregiver before blood is given. Blood is not given unless the benefits outweigh the risks. AFTER THE TRANSFUSION Right after receiving a blood transfusion, you will usually feel much better and more energetic. This is especially  true if your red blood cells have gotten low (anemic). The transfusion raises the level of the red blood cells which carry oxygen, and this usually causes an energy increase. The nurse administering the transfusion will monitor you carefully for complications. HOME CARE INSTRUCTIONS  No special instructions are needed after a transfusion. You may find your energy is better. Speak with your caregiver about any limitations on activity for underlying diseases you may have. SEEK MEDICAL CARE IF:  Your condition is not improving after your transfusion. You develop redness or irritation at the intravenous (IV) site. SEEK IMMEDIATE MEDICAL CARE IF:  Any of the following symptoms occur over the next 12 hours: Shaking chills. You have a temperature by mouth above 102 F (38.9 C), not controlled by medicine. Chest, back, or muscle pain. People around you feel you are not acting correctly or are confused. Shortness of breath or difficulty breathing. Dizziness and fainting. You get a rash or develop hives. You have a decrease in urine output. Your urine turns a dark color or changes to pink, red, or brown. Any of the following symptoms occur over the next 10 days: You have a temperature by mouth above 102 F (38.9 C), not controlled by medicine. Shortness of breath. Weakness after normal activity. The white part of the eye turns yellow (jaundice). You have a decrease in the amount of urine or are urinating less often. Your urine turns a dark color or changes to pink, red, or brown. Document Released: 06/23/2000 Document Revised: 09/18/2011 Document Reviewed: 02/10/2008 [facility] Patient Information 2014 [place], [place].  _______________________________________________________________________

## 2021-03-09 NOTE — Progress Notes (Addendum)
COVID swab appointment: 03/18/21  COVID Vaccine Completed: yes x4 Date COVID Vaccine completed: 05/20/20, 09/03/19 Has received booster: 10/01/19, 01/24/21 COVID vaccine manufacturer:   Moderna     Date of COVID positive in last 90 days: no  PCP - Sharilyn Sites, MD Cardiologist - Rozann Lesches, MD  Chest x-ray - N/a EKG - 08/31/20 Epic Stress Test - 09/16/20 Epic ECHO - 09/16/20 Epic Cardiac Cath - N/a Pacemaker/ICD device last checked: N/a Spinal Cord Stimulator: N/a  Sleep Study - yes, negative apnea CPAP -   Fasting Blood Sugar - doesn't check at home Checks Blood Sugar _____ times a day  Blood Thinner Instructions: Aspirin Instructions: ASA 81, stop 5 days Last Dose: 03/16/21  Activity level: Can go up a flight of stairs and perform activities of daily living without stopping and without symptoms of chest pain or shortness of breath.       Anesthesia review:   Patient denies shortness of breath, fever, cough and chest pain at PAT appointment   Patient verbalized understanding of instructions that were given to them at the PAT appointment. Patient was also instructed that they will need to review over the PAT instructions again at home before surgery.

## 2021-03-10 ENCOUNTER — Encounter (HOSPITAL_COMMUNITY): Payer: Self-pay

## 2021-03-10 ENCOUNTER — Encounter (HOSPITAL_COMMUNITY)
Admission: RE | Admit: 2021-03-10 | Discharge: 2021-03-10 | Disposition: A | Discharge status: Home / Self Care | Payer: Medicare Other | Source: Ambulatory Visit | Attending: Orthopedic Surgery | Admitting: Orthopedic Surgery

## 2021-03-10 ENCOUNTER — Other Ambulatory Visit: Payer: Self-pay

## 2021-03-10 DIAGNOSIS — Z01812 Encounter for preprocedural laboratory examination: Secondary | ICD-10-CM | POA: Diagnosis not present

## 2021-03-10 LAB — COMPREHENSIVE METABOLIC PANEL WITH GFR
ALT: 21 U/L (ref 0–44)
AST: 28 U/L (ref 15–41)
Albumin: 4 g/dL (ref 3.5–5.0)
Alkaline Phosphatase: 59 U/L (ref 38–126)
Anion gap: 10 (ref 5–15)
BUN: 15 mg/dL (ref 8–23)
CO2: 27 mmol/L (ref 22–32)
Calcium: 9.9 mg/dL (ref 8.9–10.3)
Chloride: 102 mmol/L (ref 98–111)
Creatinine, Ser: 0.82 mg/dL (ref 0.44–1.00)
GFR, Estimated: >60 mL/min
Glucose, Bld: 94 mg/dL (ref 70–99)
Potassium: 4 mmol/L (ref 3.5–5.1)
Sodium: 139 mmol/L (ref 135–145)
Total Bilirubin: 0.7 mg/dL (ref 0.3–1.2)
Total Protein: 7.5 g/dL (ref 6.5–8.1)

## 2021-03-10 LAB — CBC
HCT: 39.1 % (ref 36.0–46.0)
Hemoglobin: 12.7 g/dL (ref 12.0–15.0)
MCH: 27.3 pg (ref 26.0–34.0)
MCHC: 32.5 g/dL (ref 30.0–36.0)
MCV: 84.1 fL (ref 80.0–100.0)
Platelets: 266 K/uL (ref 150–400)
RBC: 4.65 MIL/uL (ref 3.87–5.11)
RDW: 15.1 % (ref 11.5–15.5)
WBC: 3.4 K/uL — ABNORMAL LOW (ref 4.0–10.5)
nRBC: 0 % (ref 0.0–0.2)

## 2021-03-10 LAB — GLUCOSE, CAPILLARY: Glucose-Capillary: 102 mg/dL — ABNORMAL HIGH (ref 70–99)

## 2021-03-10 LAB — HEMOGLOBIN A1C
Hgb A1c MFr Bld: 5.7 % — ABNORMAL HIGH (ref 4.8–5.6)
Mean Plasma Glucose: 116.89 mg/dL

## 2021-03-10 LAB — TYPE AND SCREEN
ABO/RH(D): O NEG
Antibody Screen: NEGATIVE

## 2021-03-10 LAB — SURGICAL PCR SCREEN
MRSA, PCR: NEGATIVE
Staphylococcus aureus: NEGATIVE

## 2021-03-18 ENCOUNTER — Other Ambulatory Visit: Payer: Self-pay | Admitting: Orthopedic Surgery

## 2021-03-19 LAB — SARS CORONAVIRUS 2 (TAT 6-24 HRS): SARS Coronavirus 2: NEGATIVE

## 2021-03-21 NOTE — H&P (Signed)
TOTAL KNEE REVISION ADMISSION H&P  Patient is being admitted for left revision total knee arthroplasty.  Subjective:  Chief Complaint:left knee pain.  HPI: [name], 70 y.o. female, has a history of pain and functional disability in the left knee(s) due to failed previous arthroplasty and patient has failed non-surgical conservative treatments for greater than 12 weeks to include use of assistive devices and activity modification. The indications for the revision of the total knee arthroplasty are loosening of one or more components. She has a history of left total knee arthroplasty in 2008 by Dr. Alvan Dame. She reports pain over the last few years. Bone scan was performed, which showed increased uptake around her left total knee arthroplasty components around both components perhaps a little bit more around the femoral component. Dr. Alvan Dame discussed with her revision knee surgery and she did wish to proceed. The patient denies an active infection.  Patient Active Problem List   Diagnosis Date Noted   Screening for colorectal cancer 11/29/2017   Well woman exam with routine gynecological exam 11/29/2017   Change in stool caliber 03/31/2016   Hx of adenomatous colonic polyps 03/31/2016   Superficial fungus infection of skin 11/25/2015   Urge incontinence 11/04/2014   S/P shoulder replacement 10/08/2014   Constipation - functional 10/31/2013   S/P right TH revision 12/25/2011   S/P right TKA 09/04/2011   Constipation 06/22/2011   Rectal bleeding 06/22/2011   Family history of colonic polyps 06/22/2011   Past Medical History:  Diagnosis Date   Anxiety    Constipation    Essential hypertension    Headache    Hyperlipidemia    Insomnia    Obesity    Osteoarthritis    Superficial fungus infection of skin 11/25/2015   Type 2 diabetes mellitus ([place])    Urge incontinence 11/04/2014   Vertigo    Vitamin D deficiency     Past Surgical History:  Procedure Laterality Date   ABDOMINAL  HYSTERECTOMY  1989   CARPAL TUNNEL RELEASE Right 06/2016   right middle finger fused; wire in right thumb   COLONOSCOPY  10/20/02   normal rectum/left-sided diverticula   COLONOSCOPY  07/06/2011   Dr. Gala Romney: Dionisio David anal canal hemorrhoids, diminutive rectal and transverse colon polyps. 2 adenoma on path. Left-sided diverticulosis.   COLONOSCOPY N/A 04/21/2016   Procedure: COLONOSCOPY;  Surgeon: Daneil Dolin, MD;  Location: [facility];  Service: Endoscopy;  Laterality: N/A;  930    JOINT REPLACEMENT     PARTIAL HYSTERECTOMY  1987   TOE SURGERY     second toe on both feet, bunionectomy   TOTAL HIP ARTHROPLASTY  2001/2008   multiple surgeries on right hip including replacements. Two replacements and four dislocations.    TOTAL HIP ARTHROPLASTY  2010   left   TOTAL HIP REVISION  12/25/2011   Procedure: TOTAL HIP REVISION;  Surgeon: Mauri Pole, MD;  Location: [facility];  Service: Orthopedics;  Laterality: Right;   TOTAL KNEE ARTHROPLASTY  2008   left   TOTAL KNEE ARTHROPLASTY  09/04/2011   Procedure: TOTAL KNEE ARTHROPLASTY;  Surgeon: Mauri Pole, MD;  Location: [facility];  Service: Orthopedics;  Laterality: Right;   TOTAL SHOULDER ARTHROPLASTY Left 10/08/2014   Procedure: LEFT TOTAL SHOULDER ARTHROPLASTY;  Surgeon: Justice Britain, MD;  Location: [facility];  Service: Orthopedics;  Laterality: Left;   TOTAL SHOULDER REPLACEMENT Left 10/08/2014   dr supple    No current facility-administered medications for this encounter.  Current Outpatient Medications  Medication Sig Dispense Refill Last Dose   aspirin EC 81 MG tablet Take 81 mg by mouth every other day. Swallow whole.      cholecalciferol (VITAMIN D) 25 MCG (1000 UNIT) tablet Take 1,000 Units by mouth daily.      hydroxychloroquine (PLAQUENIL) 200 MG tablet Take 400 mg by mouth daily.      lisinopril-hydrochlorothiazide (PRINZIDE,ZESTORETIC) 20-12.5 MG per tablet Take 1 tablet by mouth daily before breakfast.      Multiple Vitamin  (MULTIVITAMIN) tablet Take 1 tablet by mouth daily. Centrum      Omega-3 Fatty Acids (FISH OIL PO) Take 1 capsule by mouth daily.      phentermine (ADIPEX-P) 37.5 MG tablet Take 37.5 mg by mouth daily before breakfast.      polyethylene glycol powder (GLYCOLAX/MIRALAX) 17 GM/SCOOP powder Take 1 Container by mouth daily.      zolpidem (AMBIEN) 10 MG tablet Take 1 tablet by mouth at bedtime as needed and may repeat dose one time if needed for sleep.      No Known Allergies  Social History   Tobacco Use   Smoking status: Never   Smokeless tobacco: Never  Substance Use Topics   Alcohol use: No    Family History  Problem Relation Age of Onset   Heart failure Mother        Died 60   Diabetes Mother    Hypertension Mother    Early death Father    Pneumonia Father    Alcohol abuse Father    Heart disease Brother    Hypertension Brother    Diabetes Brother    Hypertension Sister    Hypertension Sister    Sickle cell trait Brother    GER disease Brother    Thyroid disease Daughter    Heart failure Daughter    Heart attack Daughter    Heart disease Maternal Aunt    Heart disease Maternal Uncle    Colon cancer Neg Hx       Review of Systems  Constitutional:  Negative for chills and fever.  Respiratory:  Negative for cough and shortness of breath.   Cardiovascular:  Negative for chest pain.  Gastrointestinal:  Negative for nausea and vomiting.  Musculoskeletal:  Positive for arthralgias.     Objective:  Physical Exam Well nourished and well developed. General: Alert and oriented x3, cooperative and pleasant, no acute distress. Head: normocephalic, atraumatic, neck supple. Eyes: EOMI.  Musculoskeletal:  Left knee exam: Surgical incision is healed, no erythema or warmth, no palpable effusion Tenderness to palpation around the knee Medial and lateral collateral ligaments appear to be stable and intact. No calf tenderness, no lower extremity edema or erythema  Calves  soft and nontender. Motor function intact in LE. Strength 5/5 LE bilaterally. Neuro: Distal pulses 2+. Sensation to light touch intact in LE.  Vital signs in last 24 hours:    Labs:  Estimated body mass index is 39.91 kg/m as calculated from the following:   Height as of 03/10/21: 5' 2.75" (1.594 m).   Weight as of 03/10/21: 101.4 kg.  Imaging Review Imaging: In office, we reviewed her plain radiographs as well as the bone scan that was performed on 10/04/2020. Bone scan findings revealed increased uptake around her left total knee arthroplasty components around both components perhaps a little bit more around the femoral component.  Assessment/Plan:   Aseptic loosening, left knee(s) with failed previous arthroplasty.   The patient history, physical examination,  clinical judgment of the provider and imaging studies are consistent with failed left total knee arthroplasty of the left knee(s), previous total knee arthroplasty. Revision total knee arthroplasty is deemed medically necessary. The treatment options including medical management, injection therapy, arthroscopy and revision arthroplasty were discussed at length. The risks and benefits of revision total knee arthroplasty were presented and reviewed. The risks due to aseptic loosening, infection, stiffness, patella tracking problems, thromboembolic complications and other imponderables were discussed. The patient acknowledged the explanation, agreed to proceed with the plan and consent was signed. Patient is being admitted for inpatient treatment for surgery, pain control, PT, OT, prophylactic antibiotics, VTE prophylaxis, progressive ambulation and ADL's and discharge planning.The patient is planning to be discharged  home.  Therapy Plans: outpatient therapy Disposition: Home with sister Planned DVT Prophylaxis: aspirin '81mg'$  BID DME needed: walker PCP: Dr. Hilma Favors, clearance received Rheumatologist: Dr. Amil Amen TXA: IV Allergies:  NKDA Anesthesia Concerns: Itching after spinal anesthesia BMI: 41.5 Last HgbA1c: Not diabetic.  Other: - Oxycodone, celebrex, robaxin, tylenol  Griffith Citron, PA-C Orthopedic Surgery [facility] [phone]

## 2021-03-22 ENCOUNTER — Inpatient Hospital Stay (HOSPITAL_COMMUNITY): Payer: Medicare Other | Admitting: Registered Nurse

## 2021-03-22 ENCOUNTER — Inpatient Hospital Stay (HOSPITAL_COMMUNITY)
Admission: RE | Admit: 2021-03-22 | Discharge: 2021-03-25 | DRG: 468 | Disposition: A | Discharge status: Home / Self Care | Payer: Medicare Other | Attending: Orthopedic Surgery | Admitting: Orthopedic Surgery

## 2021-03-22 ENCOUNTER — Encounter (HOSPITAL_COMMUNITY)
Admission: RE | Disposition: A | Discharge status: Home / Self Care | Payer: Self-pay | Source: Home / Self Care | Attending: Orthopedic Surgery

## 2021-03-22 ENCOUNTER — Encounter (HOSPITAL_COMMUNITY): Payer: Self-pay | Admitting: Orthopedic Surgery

## 2021-03-22 DIAGNOSIS — Z7982 Long term (current) use of aspirin: Secondary | ICD-10-CM

## 2021-03-22 DIAGNOSIS — I10 Essential (primary) hypertension: Secondary | ICD-10-CM | POA: Diagnosis present

## 2021-03-22 DIAGNOSIS — T8484XA Pain due to internal orthopedic prosthetic devices, implants and grafts, initial encounter: Secondary | ICD-10-CM | POA: Diagnosis present

## 2021-03-22 DIAGNOSIS — Z96652 Presence of left artificial knee joint: Secondary | ICD-10-CM | POA: Diagnosis not present

## 2021-03-22 DIAGNOSIS — G4700 Insomnia, unspecified: Secondary | ICD-10-CM | POA: Diagnosis present

## 2021-03-22 DIAGNOSIS — Z20822 Contact with and (suspected) exposure to covid-19: Secondary | ICD-10-CM | POA: Diagnosis not present

## 2021-03-22 DIAGNOSIS — Z833 Family history of diabetes mellitus: Secondary | ICD-10-CM

## 2021-03-22 DIAGNOSIS — T84033A Mechanical loosening of internal left knee prosthetic joint, initial encounter: Principal | ICD-10-CM | POA: Diagnosis present

## 2021-03-22 DIAGNOSIS — Z6839 Body mass index (BMI) 39.0-39.9, adult: Secondary | ICD-10-CM

## 2021-03-22 DIAGNOSIS — Z90710 Acquired absence of both cervix and uterus: Secondary | ICD-10-CM | POA: Diagnosis not present

## 2021-03-22 DIAGNOSIS — M25462 Effusion, left knee: Secondary | ICD-10-CM | POA: Diagnosis not present

## 2021-03-22 DIAGNOSIS — E669 Obesity, unspecified: Secondary | ICD-10-CM | POA: Diagnosis present

## 2021-03-22 DIAGNOSIS — Z885 Allergy status to narcotic agent status: Secondary | ICD-10-CM

## 2021-03-22 DIAGNOSIS — Z8249 Family history of ischemic heart disease and other diseases of the circulatory system: Secondary | ICD-10-CM | POA: Diagnosis not present

## 2021-03-22 DIAGNOSIS — E559 Vitamin D deficiency, unspecified: Secondary | ICD-10-CM | POA: Diagnosis not present

## 2021-03-22 DIAGNOSIS — G8918 Other acute postprocedural pain: Secondary | ICD-10-CM | POA: Diagnosis not present

## 2021-03-22 DIAGNOSIS — K5900 Constipation, unspecified: Secondary | ICD-10-CM | POA: Diagnosis not present

## 2021-03-22 DIAGNOSIS — Y792 Prosthetic and other implants, materials and accessory orthopedic devices associated with adverse incidents: Secondary | ICD-10-CM | POA: Diagnosis present

## 2021-03-22 DIAGNOSIS — T84099D Other mechanical complication of unspecified internal joint prosthesis, subsequent encounter: Secondary | ICD-10-CM | POA: Diagnosis not present

## 2021-03-22 DIAGNOSIS — Z888 Allergy status to other drugs, medicaments and biological substances status: Secondary | ICD-10-CM

## 2021-03-22 DIAGNOSIS — M25562 Pain in left knee: Secondary | ICD-10-CM | POA: Diagnosis present

## 2021-03-22 DIAGNOSIS — M1990 Unspecified osteoarthritis, unspecified site: Secondary | ICD-10-CM | POA: Diagnosis not present

## 2021-03-22 DIAGNOSIS — M1710 Unilateral primary osteoarthritis, unspecified knee: Secondary | ICD-10-CM

## 2021-03-22 DIAGNOSIS — Z471 Aftercare following joint replacement surgery: Secondary | ICD-10-CM | POA: Diagnosis not present

## 2021-03-22 DIAGNOSIS — Z79899 Other long term (current) drug therapy: Secondary | ICD-10-CM

## 2021-03-22 DIAGNOSIS — E119 Type 2 diabetes mellitus without complications: Secondary | ICD-10-CM | POA: Diagnosis present

## 2021-03-22 DIAGNOSIS — E785 Hyperlipidemia, unspecified: Secondary | ICD-10-CM | POA: Diagnosis not present

## 2021-03-22 HISTORY — PX: TOTAL KNEE REVISION: SHX996

## 2021-03-22 LAB — GLUCOSE, CAPILLARY
Glucose-Capillary: 97 mg/dL (ref 70–99)
Glucose-Capillary: 121 mg/dL — ABNORMAL HIGH (ref 70–99)

## 2021-03-22 SURGERY — TOTAL KNEE REVISION
Anesthesia: General | Site: Knee | Laterality: Left

## 2021-03-22 MED ORDER — ONDANSETRON HCL 4 MG/2ML IJ SOLN
INTRAMUSCULAR | Status: DC | PRN
  Administered 2021-03-22: 4 mg via INTRAVENOUS

## 2021-03-22 MED ORDER — ASPIRIN 81 MG PO CHEW
81.0000 mg | CHEWABLE_TABLET | Freq: Two times a day (BID) | ORAL | Status: DC
  Administered 2021-03-22 – 2021-03-25 (×6): 81 mg via ORAL
  Filled 2021-03-22 (×6): qty 1

## 2021-03-22 MED ORDER — HYDROMORPHONE HCL 1 MG/ML IJ SOLN
0.2500 mg | INTRAMUSCULAR | Status: DC | PRN
  Administered 2021-03-22 (×2): 0.5 mg via INTRAVENOUS

## 2021-03-22 MED ORDER — POVIDONE-IODINE 10 % EX SWAB
2.0000 "application " | Freq: Once | CUTANEOUS | Status: DC
  Administered 2021-03-22: 2 via TOPICAL

## 2021-03-22 MED ORDER — ONDANSETRON HCL 4 MG/2ML IJ SOLN
INTRAMUSCULAR | Status: AC
  Filled 2021-03-22: qty 2

## 2021-03-22 MED ORDER — TRANEXAMIC ACID-NACL 1000-0.7 MG/100ML-% IV SOLN: 1000.0000 mg | Freq: Once | INTRAVENOUS | Status: AC

## 2021-03-22 MED ORDER — ROCURONIUM BROMIDE 10 MG/ML (PF) SYRINGE
PREFILLED_SYRINGE | INTRAVENOUS | Status: AC
  Filled 2021-03-22: qty 10

## 2021-03-22 MED ORDER — SODIUM CHLORIDE 0.9 % IV SOLN: INTRAVENOUS | Status: DC

## 2021-03-22 MED ORDER — CHLORHEXIDINE GLUCONATE 0.12 % MT SOLN: 15.0000 mL | Freq: Once | OROMUCOSAL | Status: DC

## 2021-03-22 MED ORDER — LACTATED RINGERS IV SOLN: INTRAVENOUS | Status: DC

## 2021-03-22 MED ORDER — LIDOCAINE 2% (20 MG/ML) 5 ML SYRINGE
INTRAMUSCULAR | Status: AC
  Filled 2021-03-22: qty 5

## 2021-03-22 MED ORDER — HYDROCHLOROTHIAZIDE 12.5 MG PO CAPS
12.5000 mg | ORAL_CAPSULE | Freq: Every day | ORAL | Status: DC
  Administered 2021-03-23 – 2021-03-25 (×3): 12.5 mg via ORAL
  Filled 2021-03-22 (×4): qty 1

## 2021-03-22 MED ORDER — CELECOXIB 200 MG PO CAPS
ORAL_CAPSULE | ORAL | Status: AC
  Administered 2021-03-22: 200 mg via ORAL
  Filled 2021-03-22: qty 1

## 2021-03-22 MED ORDER — ORAL CARE MOUTH RINSE: 15.0000 mL | Freq: Once | OROMUCOSAL | Status: DC

## 2021-03-22 MED ORDER — FENTANYL CITRATE (PF) 250 MCG/5ML IJ SOLN
INTRAMUSCULAR | Status: AC
  Filled 2021-03-22: qty 5

## 2021-03-22 MED ORDER — FENTANYL CITRATE PF 50 MCG/ML IJ SOSY
25.0000 ug | PREFILLED_SYRINGE | INTRAMUSCULAR | Status: DC | PRN
  Administered 2021-03-22 (×2): 50 ug via INTRAVENOUS

## 2021-03-22 MED ORDER — 0.9 % SODIUM CHLORIDE (POUR BTL) OPTIME
TOPICAL | Status: DC | PRN
  Administered 2021-03-22: 1000 mL

## 2021-03-22 MED ORDER — FERROUS SULFATE 325 (65 FE) MG PO TABS
325.0000 mg | ORAL_TABLET | Freq: Three times a day (TID) | ORAL | Status: DC
  Administered 2021-03-23 – 2021-03-25 (×7): 325 mg via ORAL
  Filled 2021-03-22 (×8): qty 1

## 2021-03-22 MED ORDER — METOCLOPRAMIDE HCL 5 MG/ML IJ SOLN: 5.0000 mg | Freq: Three times a day (TID) | INTRAMUSCULAR | Status: DC | PRN

## 2021-03-22 MED ORDER — POLYETHYLENE GLYCOL 3350 17 G PO PACK
17.0000 g | PACK | Freq: Every day | ORAL | Status: DC | PRN
  Filled 2021-03-22: qty 1

## 2021-03-22 MED ORDER — BISACODYL 10 MG RE SUPP
10.0000 mg | Freq: Every day | RECTAL | Status: DC | PRN
  Filled 2021-03-22: qty 1

## 2021-03-22 MED ORDER — HYDROMORPHONE HCL 1 MG/ML IJ SOLN
INTRAMUSCULAR | Status: AC
  Filled 2021-03-22: qty 1

## 2021-03-22 MED ORDER — MENTHOL 3 MG MT LOZG
1.0000 | LOZENGE | OROMUCOSAL | Status: DC | PRN
  Filled 2021-03-22: qty 9

## 2021-03-22 MED ORDER — PROPOFOL 10 MG/ML IV BOLUS
INTRAVENOUS | Status: DC | PRN
  Administered 2021-03-22: 200 mg via INTRAVENOUS

## 2021-03-22 MED ORDER — KETOROLAC TROMETHAMINE 30 MG/ML IJ SOLN
INTRAMUSCULAR | Status: AC
  Filled 2021-03-22: qty 1

## 2021-03-22 MED ORDER — FENTANYL CITRATE PF 50 MCG/ML IJ SOSY
PREFILLED_SYRINGE | INTRAMUSCULAR | Status: AC
  Administered 2021-03-22: 50 ug via INTRAVENOUS
  Filled 2021-03-22: qty 3

## 2021-03-22 MED ORDER — CELECOXIB 200 MG PO CAPS
200.0000 mg | ORAL_CAPSULE | Freq: Two times a day (BID) | ORAL | Status: DC
  Administered 2021-03-23 – 2021-03-25 (×5): 200 mg via ORAL
  Filled 2021-03-22 (×5): qty 1

## 2021-03-22 MED ORDER — CEFAZOLIN SODIUM-DEXTROSE 2-4 GM/100ML-% IV SOLN
INTRAVENOUS | Status: AC
  Administered 2021-03-22: 2 g via INTRAVENOUS
  Filled 2021-03-22: qty 100

## 2021-03-22 MED ORDER — MIDAZOLAM HCL 2 MG/2ML IJ SOLN
1.0000 mg | INTRAMUSCULAR | Status: DC
  Administered 2021-03-22: 2 mg via INTRAVENOUS
  Filled 2021-03-22: qty 2

## 2021-03-22 MED ORDER — LISINOPRIL 20 MG PO TABS
20.0000 mg | ORAL_TABLET | Freq: Every day | ORAL | Status: DC
  Administered 2021-03-24 – 2021-03-25 (×2): 20 mg via ORAL
  Filled 2021-03-22 (×4): qty 1

## 2021-03-22 MED ORDER — PHENYLEPHRINE 40 MCG/ML (10ML) SYRINGE FOR IV PUSH (FOR BLOOD PRESSURE SUPPORT)
PREFILLED_SYRINGE | INTRAVENOUS | Status: AC
  Filled 2021-03-22: qty 10

## 2021-03-22 MED ORDER — PHENYLEPHRINE 40 MCG/ML (10ML) SYRINGE FOR IV PUSH (FOR BLOOD PRESSURE SUPPORT)
PREFILLED_SYRINGE | INTRAVENOUS | Status: DC | PRN
  Administered 2021-03-22: 80 ug via INTRAVENOUS

## 2021-03-22 MED ORDER — HYDROMORPHONE HCL 1 MG/ML IJ SOLN: 0.5000 mg | INTRAMUSCULAR | Status: DC | PRN

## 2021-03-22 MED ORDER — ACETAMINOPHEN 325 MG PO TABS: 325.0000 mg | ORAL_TABLET | Freq: Four times a day (QID) | ORAL | Status: DC | PRN

## 2021-03-22 MED ORDER — DEXAMETHASONE SODIUM PHOSPHATE 10 MG/ML IJ SOLN
10.0000 mg | Freq: Once | INTRAMUSCULAR | Status: AC
  Administered 2021-03-23: 10 mg via INTRAVENOUS
  Filled 2021-03-22: qty 1

## 2021-03-22 MED ORDER — FENTANYL CITRATE (PF) 250 MCG/5ML IJ SOLN
INTRAMUSCULAR | Status: DC | PRN
  Administered 2021-03-22: 25 ug via INTRAVENOUS
  Administered 2021-03-22: 50 ug via INTRAVENOUS
  Administered 2021-03-22: 150 ug via INTRAVENOUS
  Administered 2021-03-22: 25 ug via INTRAVENOUS

## 2021-03-22 MED ORDER — STERILE WATER FOR IRRIGATION IR SOLN
Status: DC | PRN
  Administered 2021-03-22: 2000 mL

## 2021-03-22 MED ORDER — ONDANSETRON HCL 4 MG/2ML IJ SOLN: 4.0000 mg | Freq: Four times a day (QID) | INTRAMUSCULAR | Status: DC | PRN

## 2021-03-22 MED ORDER — METOCLOPRAMIDE HCL 5 MG PO TABS
5.0000 mg | ORAL_TABLET | Freq: Three times a day (TID) | ORAL | Status: DC | PRN
  Filled 2021-03-22: qty 2

## 2021-03-22 MED ORDER — METHOCARBAMOL 500 MG PO TABS
500.0000 mg | ORAL_TABLET | Freq: Four times a day (QID) | ORAL | Status: DC | PRN
  Administered 2021-03-23 – 2021-03-25 (×5): 500 mg via ORAL
  Filled 2021-03-22 (×6): qty 1

## 2021-03-22 MED ORDER — CEFAZOLIN SODIUM-DEXTROSE 2-4 GM/100ML-% IV SOLN
2.0000 g | Freq: Four times a day (QID) | INTRAVENOUS | Status: AC
  Administered 2021-03-23: 2 g via INTRAVENOUS
  Filled 2021-03-22: qty 100

## 2021-03-22 MED ORDER — ONDANSETRON HCL 4 MG PO TABS
4.0000 mg | ORAL_TABLET | Freq: Four times a day (QID) | ORAL | Status: DC | PRN
  Administered 2021-03-25: 4 mg via ORAL
  Filled 2021-03-22 (×2): qty 1

## 2021-03-22 MED ORDER — DIPHENHYDRAMINE HCL 12.5 MG/5ML PO ELIX
12.5000 mg | ORAL_SOLUTION | ORAL | Status: DC | PRN
  Filled 2021-03-22: qty 10

## 2021-03-22 MED ORDER — LISINOPRIL-HYDROCHLOROTHIAZIDE 20-12.5 MG PO TABS: 1.0000 | ORAL_TABLET | Freq: Every day | ORAL | Status: DC

## 2021-03-22 MED ORDER — TRANEXAMIC ACID-NACL 1000-0.7 MG/100ML-% IV SOLN
1000.0000 mg | INTRAVENOUS | Status: AC
  Administered 2021-03-22: 1000 mg via INTRAVENOUS
  Filled 2021-03-22: qty 100

## 2021-03-22 MED ORDER — FENTANYL CITRATE PF 50 MCG/ML IJ SOSY
50.0000 ug | PREFILLED_SYRINGE | INTRAMUSCULAR | Status: DC
  Administered 2021-03-22: 50 ug via INTRAVENOUS
  Filled 2021-03-22: qty 2

## 2021-03-22 MED ORDER — CEFAZOLIN SODIUM-DEXTROSE 2-4 GM/100ML-% IV SOLN
2.0000 g | INTRAVENOUS | Status: AC
  Administered 2021-03-22: 2 g via INTRAVENOUS
  Filled 2021-03-22: qty 100

## 2021-03-22 MED ORDER — HYDROMORPHONE HCL 1 MG/ML IJ SOLN
INTRAMUSCULAR | Status: AC
  Administered 2021-03-22: 0.5 mg via INTRAVENOUS
  Filled 2021-03-22: qty 1

## 2021-03-22 MED ORDER — SUGAMMADEX SODIUM 200 MG/2ML IV SOLN
INTRAVENOUS | Status: DC | PRN
  Administered 2021-03-22: 200 mg via INTRAVENOUS

## 2021-03-22 MED ORDER — OXYCODONE HCL 5 MG PO TABS
5.0000 mg | ORAL_TABLET | ORAL | Status: DC | PRN
  Administered 2021-03-23 (×4): 5 mg via ORAL
  Administered 2021-03-25: 10 mg via ORAL
  Filled 2021-03-22: qty 2
  Filled 2021-03-22: qty 1
  Filled 2021-03-22: qty 2
  Filled 2021-03-22: qty 1
  Filled 2021-03-22: qty 2
  Filled 2021-03-22: qty 1

## 2021-03-22 MED ORDER — BUPIVACAINE-EPINEPHRINE (PF) 0.25% -1:200000 IJ SOLN
INTRAMUSCULAR | Status: AC
  Filled 2021-03-22: qty 30

## 2021-03-22 MED ORDER — OXYCODONE HCL 5 MG PO TABS
ORAL_TABLET | ORAL | Status: AC
  Administered 2021-03-23: 5 mg via ORAL
  Filled 2021-03-22: qty 1

## 2021-03-22 MED ORDER — LIDOCAINE 2% (20 MG/ML) 5 ML SYRINGE
INTRAMUSCULAR | Status: DC | PRN
  Administered 2021-03-22: 100 mg via INTRAVENOUS

## 2021-03-22 MED ORDER — TRANEXAMIC ACID-NACL 1000-0.7 MG/100ML-% IV SOLN
INTRAVENOUS | Status: AC
  Administered 2021-03-22: 1000 mg via INTRAVENOUS
  Filled 2021-03-22: qty 100

## 2021-03-22 MED ORDER — PROPOFOL 10 MG/ML IV BOLUS
INTRAVENOUS | Status: AC
  Filled 2021-03-22: qty 20

## 2021-03-22 MED ORDER — OXYCODONE HCL 5 MG PO TABS
10.0000 mg | ORAL_TABLET | ORAL | Status: DC | PRN
  Administered 2021-03-23 – 2021-03-25 (×6): 10 mg via ORAL
  Filled 2021-03-22 (×5): qty 2

## 2021-03-22 MED ORDER — DEXAMETHASONE SODIUM PHOSPHATE 10 MG/ML IJ SOLN
8.0000 mg | Freq: Once | INTRAMUSCULAR | Status: AC
  Administered 2021-03-22: 10 mg via INTRAVENOUS

## 2021-03-22 MED ORDER — HYDROXYCHLOROQUINE SULFATE 200 MG PO TABS
400.0000 mg | ORAL_TABLET | Freq: Every day | ORAL | Status: DC
  Administered 2021-03-23 – 2021-03-25 (×3): 400 mg via ORAL
  Filled 2021-03-22 (×4): qty 2

## 2021-03-22 MED ORDER — PHENOL 1.4 % MT LIQD
1.0000 | OROMUCOSAL | Status: DC | PRN
  Filled 2021-03-22: qty 177

## 2021-03-22 MED ORDER — DEXAMETHASONE SODIUM PHOSPHATE 10 MG/ML IJ SOLN
INTRAMUSCULAR | Status: AC
  Filled 2021-03-22: qty 1

## 2021-03-22 MED ORDER — DOCUSATE SODIUM 100 MG PO CAPS
100.0000 mg | ORAL_CAPSULE | Freq: Two times a day (BID) | ORAL | Status: DC
  Administered 2021-03-22 – 2021-03-25 (×6): 100 mg via ORAL
  Filled 2021-03-22 (×6): qty 1

## 2021-03-22 MED ORDER — ZOLPIDEM TARTRATE 5 MG PO TABS: 5.0000 mg | ORAL_TABLET | Freq: Every evening | ORAL | Status: DC | PRN

## 2021-03-22 MED ORDER — ROCURONIUM BROMIDE 10 MG/ML (PF) SYRINGE
PREFILLED_SYRINGE | INTRAVENOUS | Status: DC | PRN
  Administered 2021-03-22: 60 mg via INTRAVENOUS

## 2021-03-22 MED ORDER — METHOCARBAMOL 500 MG IVPB - SIMPLE MED
500.0000 mg | Freq: Four times a day (QID) | INTRAVENOUS | Status: DC | PRN
  Administered 2021-03-22: 500 mg via INTRAVENOUS
  Filled 2021-03-22: qty 500

## 2021-03-22 SURGICAL SUPPLY — 65 items
ATUNE TIB SLV M/L 53 (Orthopedic Implant) ×2 IMPLANT
AUGMENT DISTAL FEM SZ4 12 HIP (Miscellaneous) ×2 IMPLANT
AUGMENT DISTAL FEM SZ4 8 HIP (Miscellaneous) ×2 IMPLANT
BAG COUNTER SPONGE SURGICOUNT (BAG) IMPLANT
BAG DECANTER FOR FLEXI CONT (MISCELLANEOUS) IMPLANT
BAG ZIPLOCK 12X15 (MISCELLANEOUS) IMPLANT
BLADE SAW SGTL 11.0X1.19X90.0M (BLADE) IMPLANT
BLADE SAW SGTL 13.0X1.19X90.0M (BLADE) ×2 IMPLANT
BLADE SAW SGTL 81X20 HD (BLADE) ×2 IMPLANT
BLADE SURG SZ10 CARB STEEL (BLADE) ×4 IMPLANT
BNDG ELASTIC 6X5.8 VLCR STR LF (GAUZE/BANDAGES/DRESSINGS) ×2 IMPLANT
BRUSH FEMORAL CANAL (MISCELLANEOUS) IMPLANT
CEMENT HV SMART SET (Cement) ×4 IMPLANT
CEMENT RESTRICTOR DEPUY SZ 6 (Cement) ×2 IMPLANT
COMP FEM ATTUNE CRS SZ4 LT (Femur) ×2 IMPLANT
COMPONENT FEM ATN CR SZ4 LT (Femur) ×1 IMPLANT
COVER SURGICAL LIGHT HANDLE (MISCELLANEOUS) ×2 IMPLANT
CUFF TOURN SGL QUICK 34 (TOURNIQUET CUFF) ×2
CUFF TRNQT CYL 34X4.125X (TOURNIQUET CUFF) ×1 IMPLANT
DECANTER SPIKE VIAL GLASS SM (MISCELLANEOUS) IMPLANT
DERMABOND ADVANCED (GAUZE/BANDAGES/DRESSINGS) ×1
DERMABOND ADVANCED .7 DNX12 (GAUZE/BANDAGES/DRESSINGS) ×1 IMPLANT
DRAPE INCISE IOBAN 66X45 STRL (DRAPES) ×6 IMPLANT
DRAPE U-SHAPE 47X51 STRL (DRAPES) ×2 IMPLANT
DRESSING AQUACEL AG SP 3.5X10 (GAUZE/BANDAGES/DRESSINGS) ×1 IMPLANT
DRSG AQUACEL AG ADV 3.5X10 (GAUZE/BANDAGES/DRESSINGS) ×2 IMPLANT
DRSG AQUACEL AG ADV 3.5X14 (GAUZE/BANDAGES/DRESSINGS) ×2 IMPLANT
DRSG AQUACEL AG SP 3.5X10 (GAUZE/BANDAGES/DRESSINGS) ×2
DURAPREP 26ML APPLICATOR (WOUND CARE) ×4 IMPLANT
ELECT REM PT RETURN 15FT ADLT (MISCELLANEOUS) ×2 IMPLANT
GAUZE SPONGE 2X2 8PLY STRL LF (GAUZE/BANDAGES/DRESSINGS) IMPLANT
GLOVE SURG UNDER POLY LF SZ7.5 (GLOVE) ×4 IMPLANT
GOWN STRL REUS W/TWL LRG LVL3 (GOWN DISPOSABLE) ×2 IMPLANT
HANDPIECE INTERPULSE COAX TIP (DISPOSABLE) ×2
HOLDER FOLEY CATH W/STRAP (MISCELLANEOUS) IMPLANT
INSERT ROTATE PLTFM SZ4 14 HIP (Miscellaneous) ×2 IMPLANT
IRRIGATION SURGIPHOR STRL (IV SOLUTION) IMPLANT
KIT TURNOVER KIT A (KITS) ×2 IMPLANT
MANIFOLD NEPTUNE II (INSTRUMENTS) ×2 IMPLANT
NDL SAFETY ECLIPSE 18X1.5 (NEEDLE) IMPLANT
NEEDLE HYPO 18GX1.5 SHARP (NEEDLE)
NS IRRIG 1000ML POUR BTL (IV SOLUTION) ×2 IMPLANT
PACK TOTAL KNEE CUSTOM (KITS) ×2 IMPLANT
PLATE REV TIB BAS ROT SZ5 KNEE (Orthopedic Implant) ×1 IMPLANT
PROTECTOR NERVE ULNAR (MISCELLANEOUS) ×2 IMPLANT
REV TIB BASE ROT PLAT SZ5 KNEE (Orthopedic Implant) ×2 IMPLANT
SET HNDPC FAN SPRY TIP SCT (DISPOSABLE) ×1 IMPLANT
SET PAD KNEE POSITIONER (MISCELLANEOUS) ×2 IMPLANT
SLEEVE ATTUNE TIB M/L 53 (Orthopedic Implant) ×1 IMPLANT
SPONGE GAUZE 2X2 STER 10/PKG (GAUZE/BANDAGES/DRESSINGS)
STAPLER VISISTAT 35W (STAPLE) IMPLANT
STEM CEMT ATTUNE 14X80 (Knees) ×2 IMPLANT
STEM STR ATTUNE PF 16X60 (Knees) ×2 IMPLANT
SUT MNCRL AB 3-0 PS2 18 (SUTURE) ×2 IMPLANT
SUT STRATAFIX PDS+ 0 24IN (SUTURE) ×2 IMPLANT
SUT VIC AB 1 CT1 36 (SUTURE) ×2 IMPLANT
SUT VIC AB 2-0 CT1 27 (SUTURE) ×6
SUT VIC AB 2-0 CT1 TAPERPNT 27 (SUTURE) ×3 IMPLANT
SYR 50ML LL SCALE MARK (SYRINGE) IMPLANT
TOWER CARTRIDGE SMART MIX (DISPOSABLE) ×2 IMPLANT
TRAY FOLEY MTR SLVR 16FR STAT (SET/KITS/TRAYS/PACK) ×2 IMPLANT
TUBE KAMVAC SUCTION (TUBING) IMPLANT
TUBE SUCTION HIGH CAP CLEAR NV (SUCTIONS) ×2 IMPLANT
WATER STERILE IRR 1000ML POUR (IV SOLUTION) ×2 IMPLANT
WRAP KNEE MAXI GEL POST OP (GAUZE/BANDAGES/DRESSINGS) ×2 IMPLANT

## 2021-03-22 NOTE — Brief Op Note (Signed)
03/22/2021  3:48 PM  PATIENT:  [name]  70 y.o. female  PRE-OPERATIVE DIAGNOSIS:  Aseptic Loosening of Left total knee arthroplasty  POST-OPERATIVE DIAGNOSIS:  Aseptic Loosening of Left total knee arthroplasty  PROCEDURE:  Procedure(s): TOTAL KNEE REVISION (Left)  SURGEON:  Surgeon(s) and Role:    Paralee Cancel, MD - Primary  PHYSICIAN ASSISTANT: Costella Hatcher, PA-C  ANESTHESIA:   regional and general  EBL:  < 150 cc  BLOOD ADMINISTERED:none  DRAINS: none   LOCAL MEDICATIONS USED:  MARCAINE     SPECIMEN:  No Specimen  DISPOSITION OF SPECIMEN:  N/A  COUNTS:  YES  TOURNIQUET:  75 min at 225 mmHg  DICTATION: .Other Dictation: Dictation Number [id number]  PLAN OF CARE: Admit to inpatient   PATIENT DISPOSITION:  PACU - hemodynamically stable.   Delay start of Pharmacological VTE agent (>24hrs) due to surgical blood loss or risk of bleeding: no

## 2021-03-22 NOTE — Interval H&P Note (Signed)
History and Physical Interval Note:  03/22/2021 2:31 PM  [name]  has presented today for surgery, with the diagnosis of Aseptic Loosening of Left total knee arthroplasty.  The various methods of treatment have been discussed with the patient and family. After consideration of risks, benefits and other options for treatment, the patient has consented to  Procedure(s): TOTAL KNEE REVISION (Left) as a surgical intervention.  The patient's history has been reviewed, patient examined, no change in status, stable for surgery.  I have reviewed the patient's chart and labs.  Questions were answered to the patient's satisfaction.     Mauri Pole

## 2021-03-22 NOTE — Anesthesia Postprocedure Evaluation (Signed)
Anesthesia Post Note  Patient: [name]  Procedure(s) Performed: TOTAL KNEE REVISION (Left: Knee)     Patient location during evaluation: PACU Anesthesia Type: General Level of consciousness: awake Pain management: pain level controlled Respiratory status: spontaneous breathing Cardiovascular status: stable Postop Assessment: no apparent nausea or vomiting Anesthetic complications: no   No notable events documented.  Last Vitals:  Vitals:   03/22/21 1915 03/22/21 1930  BP: (!) 148/77 (!) 150/86  Pulse: 65 65  Resp: 13 14  Temp:    SpO2: 100% 100%    Last Pain:  Vitals:   03/22/21 1900  TempSrc:   PainSc: Asleep                 [name]

## 2021-03-22 NOTE — Discharge Instructions (Addendum)

## 2021-03-22 NOTE — Anesthesia Procedure Notes (Signed)
Procedure Name: Intubation Date/Time: 03/22/2021 3:45 PM Performed by: Talbot Grumbling, CRNA Pre-anesthesia Checklist: Patient identified, Suction available, Patient being monitored and Emergency Drugs available Patient Re-evaluated:Patient Re-evaluated prior to induction Oxygen Delivery Method: Circle system utilized Preoxygenation: Pre-oxygenation with 100% oxygen Induction Type: IV induction Ventilation: Mask ventilation without difficulty Laryngoscope Size: Mac and 3 Grade View: Grade I Tube type: Oral Tube size: 7.5 mm Number of attempts: 1 Airway Equipment and Method: Stylet Placement Confirmation: ETT inserted through vocal cords under direct vision, positive ETCO2 and breath sounds checked- equal and bilateral Secured at: 22 cm Tube secured with: Tape Dental Injury: Teeth and Oropharynx as per pre-operative assessment

## 2021-03-22 NOTE — Progress Notes (Signed)
Assisted Dr. Belinda Block with left, ultrasound guided, adductor canal block. Side rails up, monitors on throughout procedure. See vital signs in flow sheet. Tolerated Procedure well.

## 2021-03-22 NOTE — Anesthesia Preprocedure Evaluation (Addendum)
Anesthesia Evaluation  Patient identified by MRN, date of birth, ID band Patient awake    Reviewed: Allergy & Precautions, NPO status , Patient's Chart, lab work & pertinent test results  History of Anesthesia Complications (+) MALIGNANT HYPERTHERMIA  Airway Mallampati: II  TM Distance: >3 FB     Dental   Pulmonary    breath sounds clear to auscultation       Cardiovascular hypertension,  Rhythm:Regular Rate:Normal     Neuro/Psych  Headaches, Anxiety    GI/Hepatic negative GI ROS, Neg liver ROS,   Endo/Other  diabetes  Renal/GU negative Renal ROS     Musculoskeletal  (+) Arthritis ,   Abdominal   Peds  Hematology   Anesthesia Other Findings   Reproductive/Obstetrics                            Anesthesia Physical Anesthesia Plan  ASA: 3  Anesthesia Plan: General   Post-op Pain Management:  Regional for Post-op pain   Induction:   PONV Risk Score and Plan: Ondansetron, Propofol infusion and Midazolam  Airway Management Planned: Oral ETT  Additional Equipment:   Intra-op Plan:   Post-operative Plan: Extubation in OR  Informed Consent: I have reviewed the patients History and Physical, chart, labs and discussed the procedure including the risks, benefits and alternatives for the proposed anesthesia with the patient or authorized representative who has indicated his/her understanding and acceptance.     Dental advisory given  Plan Discussed with: CRNA and Anesthesiologist  Anesthesia Plan Comments:        Anesthesia Quick Evaluation

## 2021-03-22 NOTE — Anesthesia Procedure Notes (Signed)
Anesthesia Regional Block: Adductor canal block   Pre-Anesthetic Checklist: , timeout performed,  Correct Patient, Correct Site, Correct Laterality,  Correct Procedure, Correct Position, site marked,  Risks and benefits discussed,  Surgical consent,  Pre-op evaluation,  At surgeon's request  Laterality: Left  Prep: chloraprep       Needles:  Injection technique: Single-shot  Needle Type: Stimulator Needle - 80          Additional Needles:   Procedures: Doppler guided,,,, ultrasound used (permanent image in chart),,    Narrative:  Start time: 03/22/2021 3:00 PM End time: 03/22/2021 3:15 PM Injection made incrementally with aspirations every 5 mL.  Performed by: Personally  Anesthesiologist: Belinda Block, MD

## 2021-03-22 NOTE — Transfer of Care (Signed)
Immediate Anesthesia Transfer of Care Note  Patient: [name]  Procedure(s) Performed: TOTAL KNEE REVISION (Left: Knee)  Patient Location: PACU  Anesthesia Type:General  Level of Consciousness: awake  Airway & Oxygen Therapy: Patient Spontanous Breathing and Patient connected to face mask oxygen  Post-op Assessment: Report given to RN and Post -op Vital signs reviewed and stable  Post vital signs: Reviewed and stable  Last Vitals:  Vitals Value Taken Time  BP 135/85 03/22/21 1820  Temp    Pulse 71 03/22/21 1823  Resp 20 03/22/21 1823  SpO2 100 % 03/22/21 1823  Vitals shown include unvalidated device data.  Last Pain:  Vitals:   03/22/21 1510  TempSrc:   PainSc: 0-No pain      Patients Stated Pain Goal: 3 ([id number])  Complications: No notable events documented.

## 2021-03-23 ENCOUNTER — Encounter (HOSPITAL_COMMUNITY): Payer: Self-pay | Admitting: Orthopedic Surgery

## 2021-03-23 ENCOUNTER — Other Ambulatory Visit: Payer: Self-pay

## 2021-03-23 ENCOUNTER — Inpatient Hospital Stay (HOSPITAL_COMMUNITY): Payer: Medicare Other

## 2021-03-23 LAB — CBC
HCT: 30.4 % — ABNORMAL LOW (ref 36.0–46.0)
Hemoglobin: 9.8 g/dL — ABNORMAL LOW (ref 12.0–15.0)
MCH: 27 pg (ref 26.0–34.0)
MCHC: 32.2 g/dL (ref 30.0–36.0)
MCV: 83.7 fL (ref 80.0–100.0)
Platelets: 285 10*3/uL (ref 150–400)
RBC: 3.63 MIL/uL — ABNORMAL LOW (ref 3.87–5.11)
RDW: 14.7 % (ref 11.5–15.5)
WBC: 8.4 10*3/uL (ref 4.0–10.5)
nRBC: 0 % (ref 0.0–0.2)

## 2021-03-23 LAB — BASIC METABOLIC PANEL
Anion gap: 8 (ref 5–15)
BUN: 13 mg/dL (ref 8–23)
CO2: 24 mmol/L (ref 22–32)
Calcium: 9 mg/dL (ref 8.9–10.3)
Chloride: 108 mmol/L (ref 98–111)
Creatinine, Ser: 0.69 mg/dL (ref 0.44–1.00)
GFR, Estimated: >60 mL/min (ref >60)
Glucose, Bld: 138 mg/dL — ABNORMAL HIGH (ref 70–99)
Potassium: 4.1 mmol/L (ref 3.5–5.1)
Sodium: 140 mmol/L (ref 135–145)

## 2021-03-23 LAB — GLUCOSE, CAPILLARY: Glucose-Capillary: 121 mg/dL — ABNORMAL HIGH (ref 70–99)

## 2021-03-23 MED ORDER — ASPIRIN 81 MG PO CHEW: 81.0000 mg | CHEWABLE_TABLET | Freq: Two times a day (BID) | ORAL | 0 refills | Status: AC

## 2021-03-23 MED ORDER — CELECOXIB 200 MG PO CAPS: 200.0000 mg | ORAL_CAPSULE | Freq: Two times a day (BID) | ORAL | 0 refills | Status: DC

## 2021-03-23 MED ORDER — OXYCODONE HCL 5 MG PO TABS: 5.0000 mg | ORAL_TABLET | ORAL | 0 refills | Status: DC | PRN

## 2021-03-23 MED ORDER — METHOCARBAMOL 500 MG PO TABS: 500.0000 mg | ORAL_TABLET | Freq: Four times a day (QID) | ORAL | 0 refills | Status: DC | PRN

## 2021-03-23 MED ORDER — DOCUSATE SODIUM 100 MG PO CAPS
100.0000 mg | ORAL_CAPSULE | Freq: Two times a day (BID) | ORAL | 0 refills | Status: DC
Start: 2021-03-23 — End: 2024-04-16

## 2021-03-23 NOTE — Evaluation (Signed)
Physical Therapy Evaluation Patient Details Name: [name] MRN: [id number] DOB: Jan 26, 1951 Today's Date: 03/23/2021  History of Present Illness  70 y.o. female admitted 03/22/21 for L TKA revision. PMH includes: L TSA, R TKA 2013, R THA with revision, L TKA 2008.  Clinical Impression  Pt is s/p TKA revision resulting in the deficits listed below (see PT Problem List). Pt ambulated 48' with RW, no loss of balance. Distance limited by L knee pain. Clunking in knee noted with knee flexion AAROM and with ambulation, RN notified, she texted Dr. Alvan Dame. Initiated TKA HEP.  Pt will benefit from skilled PT to increase their independence and safety with mobility to allow discharge to the venue listed below.         Recommendations for follow up therapy are one component of a multi-disciplinary discharge planning process, led by the attending physician.  Recommendations may be updated based on patient status, additional functional criteria and insurance authorization.  Follow Up Recommendations Follow surgeon's recommendation for DC plan and follow-up therapies    Equipment Recommendations  None recommended by PT    Recommendations for Other Services       Precautions / Restrictions Precautions Precautions: Knee Precaution Booklet Issued: Yes (comment) Precaution Comments: reviewed no pillow under knee Restrictions Weight Bearing Restrictions: Yes LLE Weight Bearing: Partial weight bearing LLE Partial Weight Bearing Percentage or Pounds: 50%      Mobility  Bed Mobility Overal bed mobility: Needs Assistance Bed Mobility: Supine to Sit     Supine to sit: Supervision     General bed mobility comments: HOB up, VCs for technique    Transfers Overall transfer level: Needs assistance Equipment used: Rolling walker (2 wheeled) Transfers: Sit to/from Stand Sit to Stand: Min guard;From elevated surface         General transfer comment: VCs for hand placement and  technique  Ambulation/Gait Ambulation/Gait assistance: Min guard Gait Distance (Feet): 34 Feet Assistive device: Rolling walker (2 wheeled) Gait Pattern/deviations: Step-to pattern;Decreased step length - right;Decreased step length - left Gait velocity: decr   General Gait Details: distance limited by L knee pain, pt noted clunking in L knee with weight bearing  Stairs            Wheelchair Mobility    Modified Rankin (Stroke Patients Only)       Balance Overall balance assessment: Modified Independent                                           Pertinent Vitals/Pain Pain Assessment: 0-10 Pain Score: 7  Pain Location: L knee Pain Descriptors / Indicators: Aching;Sore Pain Intervention(s): Limited activity within patient's tolerance;Monitored during session;Patient requesting pain meds-RN notified;Ice applied    Home Living Family/patient expects to be discharged to:: Private residence Living Arrangements: Alone Available Help at Discharge: Family;Available 24 hours/day   Home Access: Stairs to enter Entrance Stairs-Rails: Can reach both;Left;Right Entrance Stairs-Number of Steps: 1 Home Layout: One level Home Equipment: Bedside commode;Walker - 2 wheels      Prior Function Level of Independence: Independent               Hand Dominance        Extremity/Trunk Assessment   Upper Extremity Assessment Upper Extremity Assessment: Overall WFL for tasks assessed    Lower Extremity Assessment Lower Extremity Assessment: LLE deficits/detail LLE Deficits / Details: SLR -3/5, knee  AAROM 5-50* with repeated clunking noted with flexion (RN notified, she texted surgeon) LLE Sensation: WNL    Cervical / Trunk Assessment Cervical / Trunk Assessment: Normal  Communication   Communication: No difficulties  Cognition Arousal/Alertness: Awake/alert Behavior During Therapy: WFL for tasks assessed/performed Overall Cognitive Status: Within  Functional Limits for tasks assessed                                        General Comments      Exercises Total Joint Exercises Ankle Circles/Pumps: AROM;Both;10 reps;Supine Quad Sets: AROM;Left;5 reps;Supine Heel Slides: AAROM;Left;5 reps;Supine Knee Flexion: AAROM;Left;10 reps;Seated Goniometric ROM: 5-50* L knee aAROM   Assessment/Plan    PT Assessment Patient needs continued PT services  PT Problem List Decreased activity tolerance;Decreased range of motion;Decreased strength;Decreased mobility;Pain       PT Treatment Interventions Gait training;DME instruction;Therapeutic activities;Therapeutic exercise;Functional mobility training;Patient/family education    PT Goals (Current goals can be found in the Care Plan section)  Acute Rehab PT Goals Patient Stated Goal: to walk without pain PT Goal Formulation: With patient Time For Goal Achievement: 03/30/21 Potential to Achieve Goals: Good    Frequency 7X/week   Barriers to discharge        Co-evaluation               AM-PAC PT "6 Clicks" Mobility  Outcome Measure Help needed turning from your back to your side while in a flat bed without using bedrails?: A Little Help needed moving from lying on your back to sitting on the side of a flat bed without using bedrails?: A Little Help needed moving to and from a bed to a chair (including a wheelchair)?: A Little Help needed standing up from a chair using your arms (e.g., wheelchair or bedside chair)?: A Little Help needed to walk in hospital room?: A Little Help needed climbing 3-5 steps with a railing? : A Little 6 Click Score: 18    End of Session Equipment Utilized During Treatment: Gait belt Activity Tolerance: Patient tolerated treatment well Patient left: in chair;with call bell/phone within reach;with chair alarm set Nurse Communication: Mobility status PT Visit Diagnosis: Difficulty in walking, not elsewhere classified  (R26.2);Pain Pain - Right/Left: Left Pain - part of body: Knee    Time: [id number] PT Time Calculation (min) (ACUTE ONLY): 32 min   Charges:   PT Evaluation $PT Eval Moderate Complexity: 1 Mod PT Treatments $Gait Training: 8-22 mins       Blondell Reveal Kistler PT 03/23/2021  Acute Rehabilitation Services Pager [phone] Office [phone]

## 2021-03-23 NOTE — Progress Notes (Signed)
   Subjective: 1 Day Post-Op Procedure(s) (LRB): TOTAL KNEE REVISION (Left) Patient reports pain as mild.   Patient seen in rounds by Dr. Alvan Dame. Patient is well, and has had no acute complaints or problems. No acute events overnight. Voiding without difficulty.  We will start therapy today.   Objective: Vital signs in last 24 hours: Temp:  [97.4 F (36.3 C)-98.2 F (36.8 C)] 97.6 F (36.4 C) (09/14 0654) Pulse Rate:  [50-80] 54 (09/14 0654) Resp:  [12-23] 16 (09/14 0654) BP: (98-150)/(62-86) 98/62 (09/14 0654) SpO2:  [99 %-100 %] 100 % (09/14 0654) Weight:  [101.4 kg] 101.4 kg (09/13 1340)  Intake/Output from previous day:  Intake/Output Summary (Last 24 hours) at 03/23/2021 0715 Last data filed at 03/23/2021 0406 Gross per 24 hour  Intake 3207.5 ml  Output 750 ml  Net 2457.5 ml     Intake/Output this shift: No intake/output data recorded.  Labs: Recent Labs    03/23/21 0250  HGB 9.8*   Recent Labs    03/23/21 0250  WBC 8.4  RBC 3.63*  HCT 30.4*  PLT 285   Recent Labs    03/23/21 0250  NA 140  K 4.1  CL 108  CO2 24  BUN 13  CREATININE 0.69  GLUCOSE 138*  CALCIUM 9.0   No results for input(s): LABPT, INR in the last 72 hours.  Exam: General - Patient is Alert and Oriented Extremity - Neurologically intact Sensation intact distally Intact pulses distally Dorsiflexion/Plantar flexion intact Dressing - dressing C/D/I Motor Function - intact, moving foot and toes well on exam.   Past Medical History:  Diagnosis Date   Anxiety    Constipation    Essential hypertension    Headache    Hyperlipidemia    Insomnia    Obesity    Osteoarthritis    Superficial fungus infection of skin 11/25/2015   Type 2 diabetes mellitus (HCC)    Urge incontinence 11/04/2014   Vertigo    Vitamin D deficiency     Assessment/Plan: 1 Day Post-Op Procedure(s) (LRB): TOTAL KNEE REVISION (Left) Active Problems:   S/P revision of total knee, left  Estimated body  mass index is 39.91 kg/m as calculated from the following:   Height as of this encounter: 5' 2.75" (1.594 m).   Weight as of this encounter: 101.4 kg. Advance diet Up with therapy   DVT Prophylaxis - Aspirin Weight bearing as tolerated.  Plan is to go Home after hospital stay. Plan to get up with PT today to begin working on mobility. Plan for discharge home tomorrow.   Griffith Citron, PA-C Orthopedic Surgery [phone] 03/23/2021, 7:15 AM

## 2021-03-23 NOTE — Progress Notes (Signed)
Patient and PT concerned about "clunking sound" coming from patients left knee with mobility. Patient also c/o pain of 7/10.   MD alerted, and ordered an Xray. States he will follow up after results are available.   Will inform patient, continue to monitor, and offer pain management.    [place], Therapist, sports

## 2021-03-23 NOTE — Op Note (Signed)
[name], [name] MEDICAL RECORD NO: [id number] ACCOUNT NO: [id number] DATE OF BIRTH: 09-16-1950 FACILITY: [facility]: [id number] PHYSICIAN: Pietro Cassis. 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  PROCEDURE:  Revision left total knee replacement.  COMPONENTS USED:  DePuy Attune knee system with a size 4 femur with 12 mm lateral distal augment, 8 mm distal medial augment, 14 x 80 mm cemented stem.  On the tibia side was a size 5 tibia baseplate with a 53 mm fully porous coated sleeve with a 16 x 60  stem.  I used a 14 mm posterior stabilized extra constrained polyethylene liner to match the size 4 femur.  SURGEON:  Paralee Cancel, MD  ASSISTANT: Costella Hatcher, PA-C.  Note that [name] was present for the entirety of the procedure from preoperative positioning, perioperative management of the operative extremity, general facilitation of the case and primary wound closure.  ANESTHESIA:  Regional plus general.  SPECIMENS:  None.  COMPLICATIONS:  None apparent.  DRAINS:  None.  BLOOD LOSS:  Less than 150 mL.  TOURNIQUET: Was up for 75 minutes at 225 mmHg.  INDICATIONS:  The patient is a very pleasant 70 year old female, well known to me for multiple surgical procedures involving all of her joints.  She is now roughly 10-12 years out from her left total knee replacement.  We have been following this along  over the past year or two with progressively increasing pain and dysfunction with a sense of instability.  Radiographic workup including plain radiographs and bone scan raised concerns regarding loosening of the components.  Based on the persistence of  her symptoms and diagnosed aseptic loosening, she wished at this point to proceed with total  knee replacement revision.  We reviewed the risks of infection, DVT, need for future surgeries.  We discussed and reviewed the postoperative course and  expectations.  Consent was obtained for benefit of pain relief.  DESCRIPTION OF PROCEDURE:  The patient was brought to the operative theater.  Once adequate anesthesia, preoperative antibiotics, Ancef administered as well as tranexamic acid and Decadron, she was positioned supine with a left thigh tourniquet placed.   The left lower extremity was then prepped and draped in sterile fashion.  A timeout was performed, identifying the patient, planned procedure, and extremity.  The leg was exsanguinated and tourniquet elevated to 225 mmHg.  Her old incision was excised.   Soft tissue planes were created.  I then made a median arthrotomy, encountering clear synovial fluid, but significant synovitic response inside the knee without any signs of inflammation, inflammatory or purulence.  The initial portion of the case was  exposure driven.  I used a Bovie cautery and performed an excisional debridement of the entire synovectomy medially, laterally as well as in the suprapatellar pouch.  She was noted to have a significant synovitic response throughout the knee.  Once this  was done including around the parapatellar region, I used an ACL saw and removed both the tibia and the femur without bone loss.  On the femoral side, there was noted to be significant erosive bone loss, particularly after debridement of the distal end  of the femur.  This was worse on the lateral aspect of the knee.  On the tibia side, there was no significant bone  loss.  I removed all remaining cement from the femur and tibia.  Once this was done, we irrigated the knee briefly and then began reaming.   I reamed both the distal femur and proximal tibia.  On the tibia side, I reamed up to 16 mm, on the femoral side same.  Once this was done, I began working on the tibia.  I used an  extramedullary guide and made a freshened up cut on this area.  I then  used broaches.  We initially broached up to the 47 broach and based a trial reduction off of this.  Once the trial was impacted with the stem, we addressed the femoral side. I sized the femur initially to be a size 5.  We revisited all the cuts per  protocol for this system.  I then was able to do a trial reduction with a 5 femur with 4 mm augments posteriorly, medially and laterally as well as the distal augment initially on the lateral side at 4 mm.  When I first did a trial reduction, I found  several issues. 1.  The knee hyperextended. 2.  The knee felt tight in flexion as compared to the extension gap.  Given all these findings, we made several adjustments.  All the trial components were removed.  I downsized from the size 5 femur to a 4 femur with no augments.  I did change the tibia  size from a 47 up to the 53 broach, having the component sit more proud on the tibia.  An additional trial reduction was now carried out, finding that there was still some hyperextension and for that reason, I elected to use the augments on the femur at  8 mm distal medial and 12 distal lateral.  At this point, we did a trial reduction, found that as I increased the polyethylene, I was able to produce a stable knee that came to full extension and seemed to be balanced in flexion.  I did release some of  the deep MCL in order to help get the flexion gap balanced to the extension gap.  The patella did not need to be revised and tracked through the trochlea without apparent complication from full extension to flexion without application of thumb pressure.  Given these findings during the final trial reduction series, all the trial components were removed.  I placed a cement restrictor on the distal aspect of the femur.  We irrigated the knee copiously.  Final components were opened and configured on the  back table under my direct supervision.  We  mixed the cement.  While this was being done, the final tibial component construct, again the size 5 tibial baseplate with a 53 sleeve and a 16 x 60 stem was impacted and sat at the level where the trial had  sat.  On the femoral side, once the cement was ready, we cemented the femoral component into place, holding it in extension with a 14 mm insert to allow the cement to cure.  Once the cement cured, excessive cement was removed throughout the knee.  Based  on the trial reductions, we selected the 14 mm, size 4 to match the femur, extra stabilized, posterior stabilized insert.  This was placed on the tibia and the knee was reduced.  The knee again tracked well from extension and flexion.  The patella  seemingly tracked into the trochlea without application of pressure.  The tourniquet was let down after 75 minutes.  No significant hemostasis  was required.  We reirrigated the knee with normal saline solution.  The extensor mechanism was then  reapproximated using #1 Vicryl and #1 Stratafix suture.  The remainder of the wound was closed with 2-0 Vicryl and a running Monocryl stitch.  The knee was clean, dry and dressed sterilely using surgical glue and Aquacel dressing.  She was then brought  to the recovery room in stable condition, tolerated the procedure well.  Findings were reviewed with the family.  POSTOPERATIVE PLAN:  She will be admitted to the hospital for one to two days to begin working on therapy and exercise.   SHW D: 03/23/2021 12:55:58 pm T: 03/23/2021 10:18:00 pm  JOB: [id number]

## 2021-03-23 NOTE — TOC Transition Note (Signed)
Transition of Care [facility]) - CM/SW Discharge Note   Patient Details  Name: [name] MRN: [id number] Date of Birth: [birth year]  [facility] of Care [facility] CM/SW Contact:  Lennart Pall, LCSW Phone Number: 03/23/2021, 2:27 PM   Clinical Narrative:    Met with pt today and confirming receipt of rolling walker via [place] (prearranged).  Plan for OPPT in [place].  No further TOC needs.   Final next level of care: OP Rehab Barriers to Discharge: No Barriers Identified   Patient Goals and CMS Choice Patient states their goals for this hospitalization and ongoing recovery are:: return home      Discharge Placement                       Discharge Plan and Services                DME Arranged: Walker rolling DME Agency: [place]                  Social Determinants of Health (SDOH) Interventions     Readmission Risk Interventions No flowsheet data found.

## 2021-03-23 NOTE — Progress Notes (Signed)
Physical Therapy Treatment Patient Details Name: [name] MRN: [id number] DOB: 20-Jan-1951 Today's Date: 03/23/2021   History of Present Illness 70 y.o. female admitted 03/22/21 for L TKA revision. PMH includes: L TSA, R TKA 2013, R THA with revision, L TKA 2008.    PT Comments    Pt is progressing with mobility, she ambulated 110' with RW. Pt performed TKA HEP with supervision. She reports less frequent (compared to this morning) but some continued painful clunking in L knee with movement.  Pain is at superior knee.    Recommendations for follow up therapy are one component of a multi-disciplinary discharge planning process, led by the attending physician.  Recommendations may be updated based on patient status, additional functional criteria and insurance authorization.  Follow Up Recommendations  Follow surgeon's recommendation for DC plan and follow-up therapies     Equipment Recommendations  None recommended by PT    Recommendations for Other Services       Precautions / Restrictions Precautions Precautions: Knee Precaution Booklet Issued: Yes (comment) Precaution Comments: reviewed no pillow under knee Restrictions Weight Bearing Restrictions: Yes LLE Weight Bearing: Partial weight bearing LLE Partial Weight Bearing Percentage or Pounds: 50%     Mobility  Bed Mobility Overal bed mobility: Needs Assistance Bed Mobility: Supine to Sit     Supine to sit: Supervision     General bed mobility comments: up in recliner    Transfers Overall transfer level: Needs assistance Equipment used: Rolling walker (2 wheeled) Transfers: Sit to/from Stand Sit to Stand: Min guard;From elevated surface         General transfer comment: VCs for hand placement and technique  Ambulation/Gait Ambulation/Gait assistance: Min guard Gait Distance (Feet): 110 Feet Assistive device: Rolling walker (2 wheeled) Gait Pattern/deviations: Step-to pattern;Decreased step length -  right;Decreased step length - left Gait velocity: decr   General Gait Details: pt reports some continued clunking in L knee but less frequent than this morning   Stairs             Wheelchair Mobility    Modified Rankin (Stroke Patients Only)       Balance Overall balance assessment: Modified Independent                                          Cognition Arousal/Alertness: Awake/alert Behavior During Therapy: WFL for tasks assessed/performed Overall Cognitive Status: Within Functional Limits for tasks assessed                                        Exercises Total Joint Exercises Ankle Circles/Pumps: AROM;Both;10 reps;Supine Quad Sets: AROM;Left;5 reps;Supine Short Arc Quad: AROM;Left;10 reps;Supine Heel Slides: AAROM;Left;Supine;10 reps Hip ABduction/ADduction: AROM;Left;10 reps;Supine Straight Leg Raises: AAROM;Left;10 reps;Supine Long Arc Quad: AROM;Left;10 reps;Seated Knee Flexion: AAROM;Left;10 reps;Seated Goniometric ROM: 0-75* AAROM L knee    General Comments        Pertinent Vitals/Pain Pain Assessment: 0-10 Pain Score: 5  Pain Location: L knee Pain Descriptors / Indicators: Aching;Sore Pain Intervention(s): Limited activity within patient's tolerance;Monitored during session;Premedicated before session;Ice applied;Repositioned    Home Living Family/patient expects to be discharged to:: Private residence Living Arrangements: Alone Available Help at Discharge: Family;Available 24 hours/day   Home Access: Stairs to enter Entrance Stairs-Rails: Can reach both;Left;Right Home Layout: One level Home  Equipment: Bedside commode;Walker - 2 wheels      Prior Function Level of Independence: Independent          PT Goals (current goals can now be found in the care plan section) Acute Rehab PT Goals Patient Stated Goal: to walk without pain, work in yard PT Goal Formulation: With patient Time For Goal Achievement:  03/30/21 Potential to Achieve Goals: Good Progress towards PT goals: Progressing toward goals    Frequency    7X/week      PT Plan Current plan remains appropriate    Co-evaluation              AM-PAC PT "6 Clicks" Mobility   Outcome Measure  Help needed turning from your back to your side while in a flat bed without using bedrails?: A Little Help needed moving from lying on your back to sitting on the side of a flat bed without using bedrails?: A Little Help needed moving to and from a bed to a chair (including a wheelchair)?: None Help needed standing up from a chair using your arms (e.g., wheelchair or bedside chair)?: None Help needed to walk in hospital room?: None Help needed climbing 3-5 steps with a railing? : A Little 6 Click Score: 21    End of Session Equipment Utilized During Treatment: Gait belt Activity Tolerance: Patient tolerated treatment well Patient left: in chair;with call bell/phone within reach;with chair alarm set Nurse Communication: Mobility status PT Visit Diagnosis: Difficulty in walking, not elsewhere classified (R26.2);Pain Pain - Right/Left: Left Pain - part of body: Knee     Time: [id number] PT Time Calculation (min) (ACUTE ONLY): 22 min  Charges:  $Gait Training: 8-22 mins                    Blondell Reveal Kistler PT 03/23/2021  Acute Rehabilitation Services Pager [phone] Office [phone]

## 2021-03-24 LAB — CBC
HCT: 28.7 % — ABNORMAL LOW (ref 36.0–46.0)
Hemoglobin: 9.2 g/dL — ABNORMAL LOW (ref 12.0–15.0)
MCH: 26.8 pg (ref 26.0–34.0)
MCHC: 32.1 g/dL (ref 30.0–36.0)
MCV: 83.7 fL (ref 80.0–100.0)
Platelets: 266 10*3/uL (ref 150–400)
RBC: 3.43 MIL/uL — ABNORMAL LOW (ref 3.87–5.11)
RDW: 14.8 % (ref 11.5–15.5)
WBC: 8.6 10*3/uL (ref 4.0–10.5)
nRBC: 0 % (ref 0.0–0.2)

## 2021-03-24 NOTE — Progress Notes (Signed)
Patient ID: [name], female   DOB: 03/13/1951, 70 y.o.   MRN: [id number] Subjective: 2 Days Post-Op Procedure(s) (LRB): TOTAL KNEE REVISION (Left)    Patient reports pain as moderate. Noting a "popping" sensation with knee movement  Objective:   VITALS:   Vitals:   03/23/21 2311 03/24/21 0533  BP: 107/65 127/70  Pulse: 64 (!) 59  Resp: 16 16  Temp: 98.2 F (36.8 C) 98.1 F (36.7 C)  SpO2: 100% 100%    Neurovascular intact Incision: dressing C/D/I On examination of her knee there is a noted soft tissue catching with active knee flexion and extension.  There does does not appear to be any significant component related mechanical issue only soft tissue.   Though an odd sensation I do not think it represents anything of significance  LABS Recent Labs    03/23/21 0250 03/24/21 0300  HGB 9.8* 9.2*  HCT 30.4* 28.7*  WBC 8.4 8.6  PLT 285 266    Recent Labs    03/23/21 0250  NA 140  K 4.1  BUN 13  CREATININE 0.69  GLUCOSE 138*    No results for input(s): LABPT, INR in the last 72 hours.  Imaging: AP and lateral of her left knee performed 9/14 were reviewed and found to have appears to be stable well fixed and aligned revised femoral and tibial components   Assessment/Plan: 2 Days Post-Op Procedure(s) (LRB): TOTAL KNEE REVISION (Left)   Up with therapy I ordered a knee immobilizer to perhaps help with early mobility to enhance sense of stability No restrictions on activity or function just focus on safety and mobility  Plan for discharge tomorrow with plans for inpatient therapy today and tomorrow to work on safe home discharge as noted

## 2021-03-24 NOTE — Plan of Care (Signed)
  Problem: Activity: Goal: Ability to avoid complications of mobility impairment will improve Outcome: Progressing   Problem: Activity: Goal: Range of joint motion will improve Outcome: Progressing   Problem: Pain Management: Goal: Pain level will decrease with appropriate interventions Outcome: Progressing   Problem: Skin Integrity: Goal: Will show signs of wound healing Outcome: Progressing

## 2021-03-24 NOTE — Progress Notes (Signed)
Physical Therapy Treatment Patient Details Name: [name] MRN: [id number] DOB: 1951/04/01 Today's Date: 03/24/2021   History of Present Illness 70 y.o. female admitted 03/22/21 for L TKA revision. PMH includes: L TSA, R TKA 2013, R THA with revision, L TKA 2008.    PT Comments    Pt ambulated 180' with RW and L KI, no loss of balance. Pt performed TKA HEP independently. No popping sensation/sound noted with knee ROM this session.     Recommendations for follow up therapy are one component of a multi-disciplinary discharge planning process, led by the attending physician.  Recommendations may be updated based on patient status, additional functional criteria and insurance authorization.  Follow Up Recommendations  Follow surgeon's recommendation for DC plan and follow-up therapies     Equipment Recommendations  None recommended by PT    Recommendations for Other Services       Precautions / Restrictions Precautions Precautions: Knee Precaution Booklet Issued: Yes (comment) Precaution Comments: reviewed no pillow under knee Required Braces or Orthoses: Knee Immobilizer - Left Knee Immobilizer - Left: On when out of bed or walking Restrictions Weight Bearing Restrictions: Yes LLE Weight Bearing: Partial weight bearing LLE Partial Weight Bearing Percentage or Pounds: 50%     Mobility  Bed Mobility Overal bed mobility: Modified Independent Bed Mobility: Sit to Supine     Supine to sit: Modified independent (Device/Increase time) Sit to supine: Modified independent (Device/Increase time)        Transfers Overall transfer level: Modified independent Equipment used: Rolling walker (2 wheeled) Transfers: Sit to/from Stand Sit to Stand: Modified independent (Device/Increase time)         General transfer comment: VCs for hand placement  Ambulation/Gait Ambulation/Gait assistance: Supervision Gait Distance (Feet): 180 Feet Assistive device: Rolling walker (2  wheeled) Gait Pattern/deviations: Step-to pattern;Decreased step length - right;Decreased step length - left Gait velocity: decr   General Gait Details: MD ordered L KI for ambulation, pt wore it for this session and reported feeling more stable when walking, no loss of balance   Stairs             Wheelchair Mobility    Modified Rankin (Stroke Patients Only)       Balance Overall balance assessment: Modified Independent                                          Cognition Arousal/Alertness: Awake/alert Behavior During Therapy: WFL for tasks assessed/performed Overall Cognitive Status: Within Functional Limits for tasks assessed                                        Exercises Total Joint Exercises Ankle Circles/Pumps: AROM;Both;10 reps;Supine Quad Sets: AROM;Left;5 reps;Supine Short Arc Quad: AROM;Left;10 reps;Supine Heel Slides: AAROM;Left;Supine;10 reps Hip ABduction/ADduction: AROM;Left;10 reps;Supine Straight Leg Raises: AAROM;Left;10 reps;Supine Long Arc Quad: AROM;Left;10 reps;Seated Knee Flexion: AAROM;Left;10 reps;Seated Goniometric ROM: 0-75* AAROM L knee    General Comments        Pertinent Vitals/Pain Pain Score: 7  Pain Location: L knee and thigh Pain Descriptors / Indicators: Aching;Sore Pain Intervention(s): Limited activity within patient's tolerance;Monitored during session;Premedicated before session;Ice applied    Home Living  Prior Function            PT Goals (current goals can now be found in the care plan section) Acute Rehab PT Goals Patient Stated Goal: to walk without pain, work in yard PT Goal Formulation: With patient Time For Goal Achievement: 03/30/21 Potential to Achieve Goals: Good Progress towards PT goals: Progressing toward goals    Frequency    7X/week      PT Plan Current plan remains appropriate    Co-evaluation              AM-PAC  PT "6 Clicks" Mobility   Outcome Measure  Help needed turning from your back to your side while in a flat bed without using bedrails?: None Help needed moving from lying on your back to sitting on the side of a flat bed without using bedrails?: A Little Help needed moving to and from a bed to a chair (including a wheelchair)?: None Help needed standing up from a chair using your arms (e.g., wheelchair or bedside chair)?: None Help needed to walk in hospital room?: None Help needed climbing 3-5 steps with a railing? : A Little 6 Click Score: 22    End of Session Equipment Utilized During Treatment: Gait belt Activity Tolerance: Patient tolerated treatment well Patient left: in chair;with call bell/phone within reach;with chair alarm set Nurse Communication: Mobility status PT Visit Diagnosis: Difficulty in walking, not elsewhere classified (R26.2);Pain Pain - Right/Left: Left Pain - part of body: Knee     Time: [id number] PT Time Calculation (min) (ACUTE ONLY): 22 min  Charges:  $Gait Training: 8-22 mins $Therapeutic Exercise: 8-22 mins                     Blondell Reveal Kistler PT 03/24/2021  Acute Rehabilitation Services Pager [phone] Office [phone]

## 2021-03-24 NOTE — Progress Notes (Signed)
Physical Therapy Treatment Patient Details Name: [name] MRN: [id number] DOB: [birth year] Today's Date: 03/24/2021   History of Present Illness 70 y.o. female admitted 03/22/21 for L TKA revision. PMH includes: L TSA, R TKA 2013, R THA with revision, L TKA 2008.    PT Comments    Noted new order for L knee immobilizer for ambulation. Pt ambulated 150' with RW and L KI, she reported L knee felt more stable with KI. Performed TKA exercises, pt does still have audible, palpable, painful popping in L knee with ROM exercises.  Noted x-ray negative.     Recommendations for follow up therapy are one component of a multi-disciplinary discharge planning process, led by the attending physician.  Recommendations may be updated based on patient status, additional functional criteria and insurance authorization.  Follow Up Recommendations  Follow surgeon's recommendation for DC plan and follow-up therapies     Equipment Recommendations  None recommended by PT    Recommendations for Other Services       Precautions / Restrictions Precautions Precautions: Knee Precaution Booklet Issued: Yes (comment) Precaution Comments: reviewed no pillow under knee Required Braces or Orthoses: Knee Immobilizer - Left Knee Immobilizer - Left: On when out of bed or walking Restrictions Weight Bearing Restrictions: Yes LLE Weight Bearing: Partial weight bearing LLE Partial Weight Bearing Percentage or Pounds: 50%     Mobility  Bed Mobility Overal bed mobility: Modified Independent Bed Mobility: Supine to Sit     Supine to sit: Modified independent (Device/Increase time)          Transfers Overall transfer level: Needs assistance Equipment used: Rolling walker (2 wheeled) Transfers: Sit to/from Stand Sit to Stand: Supervision         General transfer comment: VCs for hand placement  Ambulation/Gait Ambulation/Gait assistance: Supervision Gait Distance (Feet): 150 Feet Assistive  device: Rolling walker (2 wheeled) Gait Pattern/deviations: Step-to pattern;Decreased step length - right;Decreased step length - left Gait velocity: decr   General Gait Details: MD ordered L KI for ambulation, pt wore it for this session and reported feeling more stable when walking, no loss of balance   Stairs             Wheelchair Mobility    Modified Rankin (Stroke Patients Only)       Balance Overall balance assessment: Modified Independent                                          Cognition Arousal/Alertness: Awake/alert Behavior During Therapy: WFL for tasks assessed/performed Overall Cognitive Status: Within Functional Limits for tasks assessed                                        Exercises Total Joint Exercises Ankle Circles/Pumps: AROM;Both;10 reps;Supine Quad Sets: AROM;Left;5 reps;Supine Short Arc Quad: AROM;Left;10 reps;Supine Heel Slides: AAROM;Left;Supine;10 reps Hip ABduction/ADduction: AROM;Left;10 reps;Supine Straight Leg Raises: AAROM;Left;10 reps;Supine Long Arc Quad: AROM;Left;10 reps;Seated Knee Flexion: AAROM;Left;10 reps;Seated Goniometric ROM: 0-75* AAROM L knee    General Comments        Pertinent Vitals/Pain Pain Score: 3  Pain Location: L knee and thigh Pain Descriptors / Indicators: Aching;Sore Pain Intervention(s): Limited activity within patient's tolerance;Monitored during session;Premedicated before session;Ice applied    Home Living  Prior Function            PT Goals (current goals can now be found in the care plan section) Acute Rehab PT Goals Patient Stated Goal: to walk without pain, work in yard PT Goal Formulation: With patient Time For Goal Achievement: 03/30/21 Potential to Achieve Goals: Good Progress towards PT goals: Progressing toward goals    Frequency    7X/week      PT Plan Current plan remains appropriate    Co-evaluation               AM-PAC PT "6 Clicks" Mobility   Outcome Measure  Help needed turning from your back to your side while in a flat bed without using bedrails?: None Help needed moving from lying on your back to sitting on the side of a flat bed without using bedrails?: A Little Help needed moving to and from a bed to a chair (including a wheelchair)?: None Help needed standing up from a chair using your arms (e.g., wheelchair or bedside chair)?: None Help needed to walk in hospital room?: None Help needed climbing 3-5 steps with a railing? : A Little 6 Click Score: 22    End of Session Equipment Utilized During Treatment: Gait belt Activity Tolerance: Patient tolerated treatment well Patient left: in chair;with call bell/phone within reach;with chair alarm set Nurse Communication: Mobility status PT Visit Diagnosis: Difficulty in walking, not elsewhere classified (R26.2);Pain Pain - Right/Left: Left Pain - part of body: Knee     Time: 0821-0848 PT Time Calculation (min) (ACUTE ONLY): 27 min  Charges:  $Gait Training: 8-22 mins $Therapeutic Exercise: 8-22 mins                     Blondell Reveal Kistler PT 03/24/2021  Acute Rehabilitation Services Pager [phone] Office [phone]

## 2021-03-24 NOTE — Plan of Care (Signed)
  Problem: Activity: Goal: Range of joint motion will improve Outcome: Progressing   Problem: Pain Management: Goal: Pain level will decrease with appropriate interventions Outcome: Progressing   Problem: Clinical Measurements: Goal: Postoperative complications will be avoided or minimized Outcome: Progressing

## 2021-03-24 NOTE — Progress Notes (Signed)
Orthopedic Tech Progress Note Patient Details:  [name] 19-Jul-1950 [id number]  Ortho Devices Type of Ortho Device: Knee Immobilizer Ortho Device/Splint Location: left Ortho Device/Splint Interventions: Application   Post Interventions Patient Tolerated: Well Instructions Provided: Care of device  [place] Pink 03/24/2021, 9:36 AM

## 2021-03-25 ENCOUNTER — Ambulatory Visit (HOSPITAL_COMMUNITY): Payer: Medicare Other | Admitting: Physical Therapy

## 2021-03-25 NOTE — Progress Notes (Signed)
Physical Therapy Treatment Patient Details Name: [name] MRN: [id number] DOB: [birth year] Today's Date: 03/25/2021   History of Present Illness 70 y.o. female admitted 03/22/21 for L TKA revision. PMH includes: L TSA, R TKA 2013, R THA with revision, L TKA 2008.    PT Comments    Pt has made excellent progress today with mobility. Pt has met all PT goals for mobility and is ready for d/c home. Pt is mod Indep with mobility using RW and is Independent with HEP. Pt is cleared for d/c home today.  Recommendations for follow up therapy are one component of a multi-disciplinary discharge planning process, led by the attending physician.  Recommendations may be updated based on patient status, additional functional criteria and insurance authorization.  Follow Up Recommendations  Follow surgeon's recommendation for DC plan and follow-up therapies     Equipment Recommendations  None recommended by PT    Recommendations for Other Services       Precautions / Restrictions Precautions Precautions: Knee Required Braces or Orthoses: Knee Immobilizer - Left Knee Immobilizer - Left: Other (comment) (as needed for comfort) Restrictions Weight Bearing Restrictions: Yes LLE Weight Bearing: Partial weight bearing LLE Partial Weight Bearing Percentage or Pounds: 50     Mobility  Bed Mobility Overal bed mobility: Modified Independent Bed Mobility: Supine to Sit     Supine to sit: Modified independent (Device/Increase time) Sit to supine: Modified independent (Device/Increase time)        Transfers Overall transfer level: Modified independent Equipment used: Rolling walker (2 wheeled) Transfers: Sit to/from Stand Sit to Stand: Modified independent (Device/Increase time)         General transfer comment: VCs for hand placement  Ambulation/Gait Ambulation/Gait assistance: Modified independent (Device/Increase time) Gait Distance (Feet): 160 Feet Assistive device: Rolling  walker (2 wheeled) Gait Pattern/deviations: Step-to pattern;Decreased step length - right;Decreased step length - left Gait velocity: decr   General Gait Details: per MD note KI for comfort and stability as needed. Pt reported no clicking or popping today with standing. Ambulated without KI this session and no buckling or instibility issues noted. No increase in pain.   Stairs Stairs: Yes Stairs assistance: Min guard Stair Management: No rails;Backwards Number of Stairs: 1     Wheelchair Mobility    Modified Rankin (Stroke Patients Only)       Balance Overall balance assessment: Modified Independent                                          Cognition Arousal/Alertness: Awake/alert   Overall Cognitive Status: Within Functional Limits for tasks assessed                                        Exercises Total Joint Exercises Ankle Circles/Pumps: AROM;Both;10 reps;Supine Quad Sets: AROM;Strengthening;Left;10 reps;Supine Heel Slides: AROM;Strengthening;Left;10 reps;Supine Hip ABduction/ADduction: AROM;Strengthening;Left;10 reps;Supine Straight Leg Raises: AROM;Strengthening;Left;10 reps;Supine Long Arc Quad: AROM;Strengthening;Left;10 reps;Supine Knee Flexion: AROM;Strengthening;Left;10 reps;Seated Goniometric ROM: 0-80*    General Comments        Pertinent Vitals/Pain Pain Score: 0-No pain    Home Living                      Prior Function            PT Goals (  current goals can now be found in the care plan section) Progress towards PT goals: Goals met/education completed, patient discharged from PT    Frequency    7X/week      PT Plan Current plan remains appropriate    Co-evaluation              AM-PAC PT "6 Clicks" Mobility   Outcome Measure  Help needed turning from your back to your side while in a flat bed without using bedrails?: None Help needed moving from lying on your back to sitting on  the side of a flat bed without using bedrails?: None Help needed moving to and from a bed to a chair (including a wheelchair)?: None Help needed standing up from a chair using your arms (e.g., wheelchair or bedside chair)?: None Help needed to walk in hospital room?: None Help needed climbing 3-5 steps with a railing? : A Little 6 Click Score: 23    End of Session Equipment Utilized During Treatment: Gait belt Activity Tolerance: Patient tolerated treatment well Patient left: in chair;with call bell/phone within reach;with chair alarm set Nurse Communication: Mobility status PT Visit Diagnosis: Difficulty in walking, not elsewhere classified (R26.2);Pain Pain - Right/Left: Left Pain - part of body: Knee     Time: 1001-1039 PT Time Calculation (min) (ACUTE ONLY): 38 min  Charges:  $Gait Training: 8-22 mins $Therapeutic Exercise: 8-22 mins $Therapeutic Activity: 8-22 mins                      Lelon Mast 03/25/2021, 10:45 AM

## 2021-03-25 NOTE — Progress Notes (Signed)
Patient ID: [name], female   DOB: 1951-03-03, 70 y.o.   MRN: [id number] Subjective: 3 Days Post-Op Procedure(s) (LRB): TOTAL KNEE REVISION (Left)    Patient reports pain as moderate.  Improving.  Performed better with PT yesterday having knee immobilizer on. No events  Objective:   VITALS:   Vitals:   03/24/21 2115 03/25/21 0513  BP: 111/64 (!) 110/54  Pulse: 82 (!) 54  Resp:  16  Temp: 97.8 F (36.6 C) 97.9 F (36.6 C)  SpO2: 94% 95%    Neurovascular intact Incision: dressing C/D/I  LABS Recent Labs    03/23/21 0250 03/24/21 0300  HGB 9.8* 9.2*  HCT 30.4* 28.7*  WBC 8.4 8.6  PLT 285 266    Recent Labs    03/23/21 0250  NA 140  K 4.1  BUN 13  CREATININE 0.69  GLUCOSE 138*    No results for input(s): LABPT, INR in the last 72 hours.   Assessment/Plan: 3 Days Post-Op Procedure(s) (LRB): TOTAL KNEE REVISION (Left)   Up with therapy Home today after therapy Knee immobilizer use reviewed and can be used as needed for comfort and or stability RTC in 2 weeks

## 2021-03-25 NOTE — Plan of Care (Signed)
Discharged orders received and reviewed with pt and sister. Pt allowed to ask questions. No concerns identified. Follow up appt scheduled. Rx send to pharmacy of preference. Pt denies any pain at this time given oxycodone ~ 11:30am. Discharged to care of sister via wheelchair; no belongings left at bedside.    Problem: Education: Goal: Knowledge of the prescribed therapeutic regimen will improve Outcome: Adequate for Discharge Goal: Individualized Educational Video(s) Outcome: Adequate for Discharge   Problem: Activity: Goal: Ability to avoid complications of mobility impairment will improve Outcome: Adequate for Discharge Goal: Range of joint motion will improve Outcome: Adequate for Discharge   Problem: Clinical Measurements: Goal: Postoperative complications will be avoided or minimized Outcome: Adequate for Discharge   Problem: Pain Management: Goal: Pain level will decrease with appropriate interventions Outcome: Adequate for Discharge   Problem: Skin Integrity: Goal: Will show signs of wound healing Outcome: Adequate for Discharge   Problem: Education: Goal: Knowledge of General Education information will improve Description: Including pain rating scale, medication(s)/side effects and non-pharmacologic comfort measures Outcome: Adequate for Discharge   Problem: Health Behavior/Discharge Planning: Goal: Ability to manage health-related needs will improve Outcome: Adequate for Discharge   Problem: Clinical Measurements: Goal: Ability to maintain clinical measurements within normal limits will improve Outcome: Adequate for Discharge Goal: Will remain free from infection Outcome: Adequate for Discharge Goal: Diagnostic test results will improve Outcome: Adequate for Discharge Goal: Respiratory complications will improve Outcome: Adequate for Discharge Goal: Cardiovascular complication will be avoided Outcome: Adequate for Discharge   Problem: Activity: Goal: Risk  for activity intolerance will decrease Outcome: Adequate for Discharge   Problem: Nutrition: Goal: Adequate nutrition will be maintained Outcome: Adequate for Discharge   Problem: Coping: Goal: Level of anxiety will decrease Outcome: Adequate for Discharge   Problem: Elimination: Goal: Will not experience complications related to bowel motility Outcome: Adequate for Discharge Goal: Will not experience complications related to urinary retention Outcome: Adequate for Discharge   Problem: Pain Managment: Goal: General experience of comfort will improve Outcome: Adequate for Discharge   Problem: Safety: Goal: Ability to remain free from injury will improve Outcome: Adequate for Discharge   Problem: Skin Integrity: Goal: Risk for impaired skin integrity will decrease Outcome: Adequate for Discharge

## 2021-03-27 NOTE — Progress Notes (Addendum)
  Progress Note   Date: 03/25/2021  Patient Name: [name]        MRN#: [id number]  Clarification of diagnosis:   She has listed in her medical history from specifically cardiology office visits to have a history of Diabetes Mellitus Type II but does not currently take any medications and her recent blood sugar tests and A1-C levels do not indicate this.  For these reasons I would currently not classify her as having diabetes.   Additionally it is recorded that her BMI during this admission is 39.9 thus classifying her as obese based on current AMA guidelines with BMI between 35-39.9

## 2021-03-28 ENCOUNTER — Ambulatory Visit (HOSPITAL_COMMUNITY): Payer: Medicare Other | Attending: Student

## 2021-03-28 ENCOUNTER — Other Ambulatory Visit: Payer: Self-pay

## 2021-03-28 ENCOUNTER — Encounter (HOSPITAL_COMMUNITY): Payer: Self-pay

## 2021-03-28 DIAGNOSIS — R262 Difficulty in walking, not elsewhere classified: Secondary | ICD-10-CM | POA: Insufficient documentation

## 2021-03-28 DIAGNOSIS — M25562 Pain in left knee: Secondary | ICD-10-CM | POA: Diagnosis not present

## 2021-03-28 DIAGNOSIS — R29898 Other symptoms and signs involving the musculoskeletal system: Secondary | ICD-10-CM | POA: Insufficient documentation

## 2021-03-28 DIAGNOSIS — M6281 Muscle weakness (generalized): Secondary | ICD-10-CM | POA: Insufficient documentation

## 2021-03-28 NOTE — Patient Instructions (Signed)
Access Code: [email or web address] URL: [email or web address] Date: 03/28/2021 Prepared by: Sherlyn Lees  Exercises Hooklying Clamshell with Resistance - 1 x daily - 7 x weekly - 3 sets - 10 reps Sit to Stand with Resistance Around Legs - 1 x daily - 7 x weekly - 3 sets - 10 reps

## 2021-03-28 NOTE — Therapy (Signed)
[place], [place], [address] Phone: [phone]   Fax:  [phone]  Physical Therapy Evaluation  Patient Details  Name: [name] MRN: [id number] Date of Birth: Apr 11, 1951 Referring Provider (PT): Dr. Paralee Cancel   Encounter Date: 03/28/2021   PT End of Session - 03/28/21 1259     Visit Number 1    Number of Visits 18    Date for PT Re-Evaluation 05/09/21    Authorization Type [employer] HMO, auth required    PT Start Time 72    PT Stop Time [id number]    PT Time Calculation (min) 45 min    Activity Tolerance Patient tolerated treatment well    Behavior During Therapy [facility] for tasks assessed/performed             Past Medical History:  Diagnosis Date   Anxiety    Constipation    Essential hypertension    Headache    Hyperlipidemia    Insomnia    Obesity    Osteoarthritis    Superficial fungus infection of skin 11/25/2015   Type 2 diabetes mellitus ([place])    Urge incontinence 11/04/2014   Vertigo    Vitamin D deficiency     Past Surgical History:  Procedure Laterality Date   [place] Right 06/2016   right middle finger fused; wire in right thumb   COLONOSCOPY  10/20/02   normal rectum/left-sided diverticula   COLONOSCOPY  07/06/2011   Dr. Gala RomneyDionisio David anal canal hemorrhoids, diminutive rectal and transverse colon polyps. 2 adenoma on path. Left-sided diverticulosis.   COLONOSCOPY N/A 04/21/2016   Procedure: COLONOSCOPY;  Surgeon: Daneil Dolin, MD;  [facility];  Service: Endoscopy;  Laterality: N/A;  930    JOINT REPLACEMENT     PARTIAL HYSTERECTOMY  1987   TOE SURGERY     second toe on both feet, bunionectomy   TOTAL HIP ARTHROPLASTY  2001/2008   multiple surgeries on right hip including replacements. Two replacements and four dislocations.    TOTAL HIP ARTHROPLASTY  2010   left   TOTAL HIP REVISION  12/25/2011    Procedure: TOTAL HIP REVISION;  Surgeon: Mauri Pole, MD;  [facility];  Service: Orthopedics;  Laterality: Right;   TOTAL KNEE ARTHROPLASTY  2008   left   TOTAL KNEE ARTHROPLASTY  09/04/2011   Procedure: TOTAL KNEE ARTHROPLASTY;  Surgeon: Mauri Pole, MD;  [facility];  Service: Orthopedics;  Laterality: Right;   TOTAL KNEE REVISION Left 03/22/2021   Procedure: TOTAL KNEE REVISION;  Surgeon: Paralee Cancel, MD;  [facility];  Service: Orthopedics;  Laterality: Left;   TOTAL SHOULDER ARTHROPLASTY Left 10/08/2014   Procedure: LEFT TOTAL SHOULDER ARTHROPLASTY;  Surgeon: Justice Britain, MD;  Location: [facility];  Service: Orthopedics;  Laterality: Left;   TOTAL SHOULDER REPLACEMENT Left 10/08/2014   dr supple    There were no vitals filed for this visit.    Subjective Assessment - 03/28/21 1325     Subjective Underwent Left TKR revision on 03/22/21 and presents to clinic today for initiation of therapy services  due to continued left knee pain, difficulty with walking, and limited ROM    Currently in Pain? Yes    Pain Score 2     Pain Location Knee    Pain Orientation Left    Pain Descriptors / Indicators Aching;Sore    Pain Type Surgical  pain    Pain Onset In the past 7 days    Aggravating Factors  WBing, bending    Pain Relieving Factors ice, Rx, elevation                [facility] PT Assessment - 03/28/21 0001       Assessment   Medical Diagnosis Left TKR    Referring Provider (PT) Dr. Paralee Cancel    Next MD Visit 2 weeks      Balance Screen   Has the patient fallen in the past 6 months No    Has the patient had a decrease in activity level because of a fear of falling?  No    Is the patient reluctant to leave their home because of a fear of falling?  No      Home Environment   Living Environment Private residence    Living Arrangements Alone    Type of [place] Access Stairs to enter    Entrance Stairs-Number of Steps 1    Entrance Stairs-Rails  Can reach both    [place] One level    [place] None      Prior Function   Level of [place] Retired      Observation/Other Assessments   Focus on Therapeutic Outcomes (FOTO)  60.5% function      ROM / Strength   AROM / PROM / Strength AROM;Strength      AROM   AROM Assessment Site Knee    Right/Left Knee Left    Left Knee Extension 3   lacking   Left Knee Flexion 90      Strength   Strength Assessment Site Knee    Right/Left Knee Left    Left Knee Flexion 3/5    Left Knee Extension 3-/5      Palpation   Patella mobility genu valgum      Transfers   Transfers Sit to Stand    Sit to Stand 6: Modified independent (Device/Increase time)      Ambulation/Gait   Ambulation/Gait Yes    Ambulation/Gait Assistance 6: Modified independent (Device/Increase time)    Ambulation Distance (Feet) 180 Feet    Assistive device Rolling walker    Gait Pattern Step-through pattern    Ambulation Surface Level;Indoor    Stairs Yes    Stairs Assistance 4: Min guard    Stair Management Technique Two rails;Step to pattern    Gait Comments 2MWT                        Objective measurements completed on examination: See above findings.       [place] Adult PT Treatment/Exercise - 03/28/21 0001       Exercises   Exercises Knee/Hip      Knee/Hip Exercises: Seated   Sit to Sand 1 set;10 reps;with UE support   with red loop around knees     Knee/Hip Exercises: Supine   Quad Sets Strengthening;Left;2 sets;10 reps    Heel Slides AAROM;Left;1 set;10 reps    Other Supine Knee/Hip Exercises hip abd with red loop 2x10                     PT Education - 03/28/21 1342     Education Details education on HEP additions              PT Short Term Goals - 03/28/21 1405  PT SHORT TERM GOAL #1   Title Patient will be independent with HEP in order to improve functional outcomes.    Time 3    Period Weeks    Status  New    Target Date 04/18/21      PT SHORT TERM GOAL #2   Title Patient will report at least 25% improvement in symptoms for improved quality of life.    Time 3    Period Weeks    Status New    Target Date 04/18/21      PT SHORT TERM GOAL #3   Title Demo full left knee extension, and 110 degree left knee flexion to enable normalized gait mechanics and transfers    Baseline 3 lacking, 90 degree flexion    Time 3    Period Weeks    Status New    Target Date 04/18/21      PT SHORT TERM GOAL #4   Title Patient will be able to ambulate at least 250 feet in 2MWT in order to demonstrate improved gait speed for community ambulation.    Baseline 180 ft with cane    Time 3    Period Weeks    Status New    Target Date 04/18/21               PT Long Term Goals - 03/28/21 1408       PT LONG TERM GOAL #1   Title Patient will improve FOTO score by at least 5 points in order to indicate improved tolerance to activity.    Baseline 60% function    Time 6    Period Weeks    Status New    Target Date 05/09/21      PT LONG TERM GOAL #2   Title Patient will be able to navigate stairs with reciprocal pattern without compensation in order to demonstrate improved Jennifer strength.    Baseline step-to    Time 6    Period Weeks    Status New    Target Date 05/09/21                    Plan - 03/28/21 1335     Clinical Impression Statement Patient is a 70 yo lady presenting to physical therapy with c/o left knee pain, weakness, ROM limitation. She presents with pain limited deficits in Jennifer strength, ROM, endurance, and functional mobility with ADL. She is having to modify and restrict ADL as indicated by FOTO score as well as subjective information and objective measures which is affecting overall participation. Patient will benefit from skilled physical therapy in order to improve function and reduce impairment.    Personal Factors and Comorbidities Age;Time since onset of  injury/illness/exacerbation;Past/Current Experience    Examination-Activity Limitations Bend;Carry;Lift;Stand;Stairs;Squat;Locomotion Level;Transfers    Examination-Participation Restrictions Cleaning;Yard Work;Shop;Driving;Meal Prep    Stability/Clinical Decision Making Stable/Uncomplicated    Clinical Decision Making Low    Rehab Potential Good    PT Frequency 3x / week    PT Duration 6 weeks    PT Treatment/Interventions ADLs/Self Care Home Management;Gait training;Stair training;Functional mobility training;Therapeutic activities;Therapeutic exercise;Balance training;Patient/family education;Neuromuscular re-education;Dry needling;Manual techniques;Taping;Passive range of motion    PT Next Visit Plan Continued with TKR rehab    PT Home Exercise Plan QS, ball squeeze, hip abd, sit to stand with red loop around knees    Consulted and Agree with Plan of Care Patient             Patient  will benefit from skilled therapeutic intervention in order to improve the following deficits and impairments:  Abnormal gait, Decreased activity tolerance, Decreased mobility, Decreased strength, Decreased range of motion, Difficulty walking, Pain, Improper body mechanics  Visit Diagnosis: Acute pain of left knee  Difficulty in walking, not elsewhere classified  Muscle weakness (generalized)  Other symptoms and signs involving the musculoskeletal system     Problem List Patient Active Problem List   Diagnosis Date Noted   S/P revision of total knee, left 03/22/2021   Screening for colorectal cancer 11/29/2017   Well woman exam with routine gynecological exam 11/29/2017   Change in stool caliber 03/31/2016   Hx of adenomatous colonic polyps 03/31/2016   Superficial fungus infection of skin 11/25/2015   Urge incontinence 11/04/2014   S/P shoulder replacement 10/08/2014   Constipation - functional 10/31/2013   S/P right TH revision 12/25/2011   S/P right TKA 09/04/2011   Constipation  06/22/2011   Rectal bleeding 06/22/2011   Family history of colonic polyps 06/22/2011   2:10 PM, 03/28/21 M. Sherlyn Lees, PT, DPT Physical Therapist- [place] Office Number: [phone]   [facility] [address] [place], [place], [address] Phone: [phone]   Fax:  [phone]  Name: [name] MRN: [id number] Date of Birth: [birth year]

## 2021-03-30 ENCOUNTER — Encounter (HOSPITAL_COMMUNITY): Payer: Medicare Other | Admitting: Physical Therapy

## 2021-04-01 ENCOUNTER — Other Ambulatory Visit: Payer: Self-pay

## 2021-04-01 ENCOUNTER — Ambulatory Visit (HOSPITAL_COMMUNITY): Payer: Medicare Other

## 2021-04-01 DIAGNOSIS — M6281 Muscle weakness (generalized): Secondary | ICD-10-CM | POA: Diagnosis not present

## 2021-04-01 DIAGNOSIS — R29898 Other symptoms and signs involving the musculoskeletal system: Secondary | ICD-10-CM | POA: Diagnosis not present

## 2021-04-01 DIAGNOSIS — R262 Difficulty in walking, not elsewhere classified: Secondary | ICD-10-CM

## 2021-04-01 DIAGNOSIS — M25562 Pain in left knee: Secondary | ICD-10-CM | POA: Diagnosis not present

## 2021-04-01 NOTE — Therapy (Signed)
[place], [place], [address] Phone: [phone]   Fax:  [phone]  Physical Therapy Treatment  Patient Details  Name: [name] MRN: [id number] Date of Birth: 1950/12/08 Referring Provider (PT): Dr. Paralee Cancel   Encounter Date: 04/01/2021   PT End of Session - 04/01/21 1351     Visit Number 2    Number of Visits 18    Date for PT Re-Evaluation 05/09/21    Authorization Type [employer] HMO, auth required    PT Start Time 1345    PT Stop Time 1430    PT Time Calculation (min) 45 min    Activity Tolerance Patient tolerated treatment well    Behavior During Therapy [facility] for tasks assessed/performed             Past Medical History:  Diagnosis Date   Anxiety    Constipation    Essential hypertension    Headache    Hyperlipidemia    Insomnia    Obesity    Osteoarthritis    Superficial fungus infection of skin 11/25/2015   Type 2 diabetes mellitus ([place])    Urge incontinence 11/04/2014   Vertigo    Vitamin D deficiency     Past Surgical History:  Procedure Laterality Date   [place] Right 06/2016   right middle finger fused; wire in right thumb   COLONOSCOPY  10/20/02   normal rectum/left-sided diverticula   COLONOSCOPY  07/06/2011   Dr. Gala RomneyDionisio David anal canal hemorrhoids, diminutive rectal and transverse colon polyps. 2 adenoma on path. Left-sided diverticulosis.   COLONOSCOPY N/A 04/21/2016   Procedure: COLONOSCOPY;  Surgeon: Daneil Dolin, MD;  [facility];  Service: Endoscopy;  Laterality: N/A;  930    JOINT REPLACEMENT     PARTIAL HYSTERECTOMY  1987   TOE SURGERY     second toe on both feet, bunionectomy   TOTAL HIP ARTHROPLASTY  2001/2008   multiple surgeries on right hip including replacements. Two replacements and four dislocations.    TOTAL HIP ARTHROPLASTY  2010   left   TOTAL HIP REVISION  12/25/2011    Procedure: TOTAL HIP REVISION;  Surgeon: Mauri Pole, MD;  [facility];  Service: Orthopedics;  Laterality: Right;   TOTAL KNEE ARTHROPLASTY  2008   left   TOTAL KNEE ARTHROPLASTY  09/04/2011   Procedure: TOTAL KNEE ARTHROPLASTY;  Surgeon: Mauri Pole, MD;  [facility];  Service: Orthopedics;  Laterality: Right;   TOTAL KNEE REVISION Left 03/22/2021   Procedure: TOTAL KNEE REVISION;  Surgeon: Paralee Cancel, MD;  [facility];  Service: Orthopedics;  Laterality: Left;   TOTAL SHOULDER ARTHROPLASTY Left 10/08/2014   Procedure: LEFT TOTAL SHOULDER ARTHROPLASTY;  Surgeon: Justice Britain, MD;  Location: [facility];  Service: Orthopedics;  Laterality: Left;   TOTAL SHOULDER REPLACEMENT Left 10/08/2014   dr supple    There were no vitals filed for this visit.   Subjective Assessment - 04/01/21 1404     Subjective No issues noted.    Currently in Pain? No/denies    Pain Score 0-No pain    Pain Onset In the past 7 days                [facility] PT Assessment - 04/01/21 0001       Assessment   Medical Diagnosis Left TKR  [place] Adult PT Treatment/Exercise - 04/01/21 0001       Knee/Hip Exercises: Aerobic   Nustep level 2 x 10 min for AROM      Knee/Hip Exercises: Machines for Strengthening   Cybex Knee Flexion 4 plates 3x10      Knee/Hip Exercises: Standing   Knee Flexion Strengthening;Left;2 sets;10 reps    Knee Flexion Limitations red loop    Hip Flexion Stengthening;Both;2 sets;10 reps    Hip Flexion Limitations red loop    Other Standing Knee Exercises sidestepping red t-loop x2 min                       PT Short Term Goals - 03/28/21 1405       PT SHORT TERM GOAL #1   Title Patient will be independent with HEP in order to improve functional outcomes.    Time 3    Period Weeks    Status New    Target Date 04/18/21      PT SHORT TERM GOAL #2   Title Patient will report at least 25% improvement in  symptoms for improved quality of life.    Time 3    Period Weeks    Status New    Target Date 04/18/21      PT SHORT TERM GOAL #3   Title Demo full left knee extension, and 110 degree left knee flexion to enable normalized gait mechanics and transfers    Baseline 3 lacking, 90 degree flexion    Time 3    Period Weeks    Status New    Target Date 04/18/21      PT SHORT TERM GOAL #4   Title Patient will be able to ambulate at least 250 feet in 2MWT in order to demonstrate improved gait speed for community ambulation.    Baseline 180 ft with cane    Time 3    Period Weeks    Status New    Target Date 04/18/21               PT Long Term Goals - 03/28/21 1408       PT LONG TERM GOAL #1   Title Patient will improve FOTO score by at least 5 points in order to indicate improved tolerance to activity.    Baseline 60% function    Time 6    Period Weeks    Status New    Target Date 05/09/21      PT LONG TERM GOAL #2   Title Patient will be able to navigate stairs with reciprocal pattern without compensation in order to demonstrate improved LE strength.    Baseline step-to    Time 6    Period Weeks    Status New    Target Date 05/09/21                   Plan - 04/01/21 1436     Clinical Impression Statement Continues to demonstrate left knee ROM and strength limitations. Crepitation in joint noted when performing WBing positions and transition movements (sit to stand). No gross instability appreciated but notes discomfort and feeling of weakness in onvolved limb. Continued sessions indicated to progress HEP for ROM and strength and to normalize gait pattern    Personal Factors and Comorbidities Age;Time since onset of injury/illness/exacerbation;Past/Current Experience    Examination-Activity Limitations Bend;Carry;Lift;Stand;Stairs;Squat;Locomotion Level;Transfers    Examination-Participation Restrictions Cleaning;Yard Work;Shop;Driving;Meal Prep     Stability/Clinical Decision Making Stable/Uncomplicated  Rehab Potential Good    PT Frequency 3x / week    PT Duration 6 weeks    PT Treatment/Interventions ADLs/Self Care Home Management;Gait training;Stair training;Functional mobility training;Therapeutic activities;Therapeutic exercise;Balance training;Patient/family education;Neuromuscular re-education;Dry needling;Manual techniques;Taping;Passive range of motion    PT Next Visit Plan Continued with TKR rehab    PT Home Exercise Plan QS, ball squeeze, hip abd, sit to stand with red loop around knees    Consulted and Agree with Plan of Care Patient             Patient will benefit from skilled therapeutic intervention in order to improve the following deficits and impairments:  Abnormal gait, Decreased activity tolerance, Decreased mobility, Decreased strength, Decreased range of motion, Difficulty walking, Pain, Improper body mechanics  Visit Diagnosis: Acute pain of left knee  Difficulty in walking, not elsewhere classified  Muscle weakness (generalized)  Other symptoms and signs involving the musculoskeletal system     Problem List Patient Active Problem List   Diagnosis Date Noted   S/P revision of total knee, left 03/22/2021   Screening for colorectal cancer 11/29/2017   Well woman exam with routine gynecological exam 11/29/2017   Change in stool caliber 03/31/2016   Hx of adenomatous colonic polyps 03/31/2016   Superficial fungus infection of skin 11/25/2015   Urge incontinence 11/04/2014   S/P shoulder replacement 10/08/2014   Constipation - functional 10/31/2013   S/P right TH revision 12/25/2011   S/P right TKA 09/04/2011   Constipation 06/22/2011   Rectal bleeding 06/22/2011   Family history of colonic polyps 06/22/2011    Toniann Fail, PT 04/01/2021, 2:38 PM  [facility] [address] [place], [place], [address] Phone: [phone]   Fax:   [phone]  Name: BRITHNEY BENSEN MRN: [id number] Date of Birth: 1951-04-10

## 2021-04-01 NOTE — Patient Instructions (Signed)
Access Code: [email or web address] URL: [email or web address] Date: 04/01/2021 Prepared by: Sherlyn Lees  Exercises Side Stepping with Resistance at Ankles - 1 x daily - 7 x weekly - 3 sets - 10 reps Standing Hamstring Curl with Resistance - 1 x daily - 7 x weekly - 3 sets - 10 reps Seated Hamstring Curls with Resistance - 1 x daily - 7 x weekly - 3 sets - 10 reps Seated Knee Extension with Resistance - 1 x daily - 7 x weekly - 3 sets - 10 reps Supine Hip Flexion with Resistance Loop - 1 x daily - 7 x weekly - 3 sets - 10 reps Standing [place] with Counter Support - 1 x daily - 7 x weekly - 3 sets - 10 reps Sit to Stand with Resistance Around Legs - 1 x daily - 7 x weekly - 3 sets - 10 reps

## 2021-04-04 ENCOUNTER — Ambulatory Visit (HOSPITAL_COMMUNITY): Payer: Medicare Other

## 2021-04-04 ENCOUNTER — Encounter (HOSPITAL_COMMUNITY): Payer: Self-pay

## 2021-04-04 ENCOUNTER — Other Ambulatory Visit: Payer: Self-pay

## 2021-04-04 DIAGNOSIS — R29898 Other symptoms and signs involving the musculoskeletal system: Secondary | ICD-10-CM

## 2021-04-04 DIAGNOSIS — M25562 Pain in left knee: Secondary | ICD-10-CM | POA: Diagnosis not present

## 2021-04-04 DIAGNOSIS — R262 Difficulty in walking, not elsewhere classified: Secondary | ICD-10-CM

## 2021-04-04 DIAGNOSIS — M6281 Muscle weakness (generalized): Secondary | ICD-10-CM

## 2021-04-04 NOTE — Therapy (Signed)
[place], [place], [address] Phone: [phone]   Fax:  [phone]  Physical Therapy Treatment  Patient Details  Name: [name] MRN: [id number] Date of Birth: [birth year] Referring Provider (PT): Dr. Paralee Cancel   Encounter Date: 04/04/2021   PT End of Session - 04/04/21 1310     Visit Number 3    Number of Visits 18    Date for PT Re-Evaluation 05/09/21    Authorization Type [employer] HMO, auth required    PT Start Time 59    PT Stop Time 4888    PT Time Calculation (min) 45 min    Activity Tolerance Patient tolerated treatment well    Behavior During Therapy [facility] for tasks assessed/performed             Past Medical History:  Diagnosis Date   Anxiety    Constipation    Essential hypertension    Headache    Hyperlipidemia    Insomnia    Obesity    Osteoarthritis    Superficial fungus infection of skin 11/25/2015   Type 2 diabetes mellitus ([place])    Urge incontinence 11/04/2014   Vertigo    Vitamin D deficiency     Past Surgical History:  Procedure Laterality Date   [place] Right 06/2016   right middle finger fused; wire in right thumb   COLONOSCOPY  10/20/02   normal rectum/left-sided diverticula   COLONOSCOPY  07/06/2011   Dr. Gala RomneyDionisio David anal canal hemorrhoids, diminutive rectal and transverse colon polyps. 2 adenoma on path. Left-sided diverticulosis.   COLONOSCOPY N/A 04/21/2016   Procedure: COLONOSCOPY;  Surgeon: Daneil Dolin, MD;  [facility];  Service: Endoscopy;  Laterality: N/A;  930    JOINT REPLACEMENT     PARTIAL HYSTERECTOMY  1987   TOE SURGERY     second toe on both feet, bunionectomy   TOTAL HIP ARTHROPLASTY  2001/2008   multiple surgeries on right hip including replacements. Two replacements and four dislocations.    TOTAL HIP ARTHROPLASTY  2010   left   TOTAL HIP REVISION  12/25/2011    Procedure: TOTAL HIP REVISION;  Surgeon: Mauri Pole, MD;  Location: [facility];  Service: Orthopedics;  Laterality: Right;   TOTAL KNEE ARTHROPLASTY  2008   left   TOTAL KNEE ARTHROPLASTY  09/04/2011   Procedure: TOTAL KNEE ARTHROPLASTY;  Surgeon: Mauri Pole, MD;  Location: [facility];  Service: Orthopedics;  Laterality: Right;   TOTAL KNEE REVISION Left 03/22/2021   Procedure: TOTAL KNEE REVISION;  Surgeon: Paralee Cancel, MD;  Location: [facility];  Service: Orthopedics;  Laterality: Left;   TOTAL SHOULDER ARTHROPLASTY Left 10/08/2014   Procedure: LEFT TOTAL SHOULDER ARTHROPLASTY;  Surgeon: Justice Britain, MD;  Location: [facility];  Service: Orthopedics;  Laterality: Left;   TOTAL SHOULDER REPLACEMENT Left 10/08/2014   dr supple    There were no vitals filed for this visit.   Subjective Assessment - 04/04/21 1307     Subjective Pt notes continued issue of left knee klunking and popping, no increase in pain and denies locking/instability    Currently in Pain? No/denies    Pain Score 0-No pain    Pain Location Knee    Pain Orientation Left    Pain Descriptors / Indicators Aching    Pain Type Surgical pain    Pain Onset In the past 7 days  [facility] PT Assessment - 04/04/21 0001       Assessment   Medical Diagnosis Left TKR    Referring Provider (PT) Dr. Paralee Cancel                           [facility] Adult PT Treatment/Exercise - 04/04/21 0001       Knee/Hip Exercises: Aerobic   Nustep level 5 x 5 min for dynamic warmup      Knee/Hip Exercises: Machines for Strengthening   Cybex Knee Flexion 5 plates 3x10      Knee/Hip Exercises: Standing   Knee Flexion Strengthening;Left;10 reps;3 sets    Knee Flexion Limitations red loop    Hip Flexion Stengthening;Both;10 reps;3 sets    Hip Flexion Limitations red loop    Other Standing Knee Exercises sidestepping red t-loop x2 min      Knee/Hip Exercises: Seated   Sit to Sand 2 sets;10 reps;with UE support    trials in ball between knees for form, band around knees for hip abduction                      PT Short Term Goals - 03/28/21 1405       PT SHORT TERM GOAL #1   Title Patient will be independent with HEP in order to improve functional outcomes.    Time 3    Period Weeks    Status New    Target Date 04/18/21      PT SHORT TERM GOAL #2   Title Patient will report at least 25% improvement in symptoms for improved quality of life.    Time 3    Period Weeks    Status New    Target Date 04/18/21      PT SHORT TERM GOAL #3   Title Demo full left knee extension, and 110 degree left knee flexion to enable normalized gait mechanics and transfers    Baseline 3 lacking, 90 degree flexion    Time 3    Period Weeks    Status New    Target Date 04/18/21      PT SHORT TERM GOAL #4   Title Patient will be able to ambulate at least 250 feet in 2MWT in order to demonstrate improved gait speed for community ambulation.    Baseline 180 ft with cane    Time 3    Period Weeks    Status New    Target Date 04/18/21               PT Long Term Goals - 03/28/21 1408       PT LONG TERM GOAL #1   Title Patient will improve FOTO score by at least 5 points in order to indicate improved tolerance to activity.    Baseline 60% function    Time 6    Period Weeks    Status New    Target Date 05/09/21      PT LONG TERM GOAL #2   Title Patient will be able to navigate stairs with reciprocal pattern without compensation in order to demonstrate improved LE strength.    Baseline step-to    Time 6    Period Weeks    Status New    Target Date 05/09/21                   Plan - 04/04/21 1340     Clinical Impression Statement left  knee exhibits pronounced klunking and tendency to externally rotate to a forceful internal rotation with left tibia when advancing flexion to extension.  Decrease in sensation and audible klunk when therapist manually pushes patella medially  during open chain maneuvers. Considerable crepitus noted at tib-fem with closed chain flexion-extension. No gross instability noted and no sharp pains experienced with these instances. Continued sessions indicated to increase left knee strength/stability    Personal Factors and Comorbidities Age;Time since onset of injury/illness/exacerbation;Past/Current Experience    Examination-Activity Limitations Bend;Carry;Lift;Stand;Stairs;Squat;Locomotion Level;Transfers    Examination-Participation Restrictions Cleaning;Yard Work;Shop;Driving;Meal Prep    Stability/Clinical Decision Making Stable/Uncomplicated    Rehab Potential Good    PT Frequency 3x / week    PT Duration 6 weeks    PT Treatment/Interventions ADLs/Self Care Home Management;Gait training;Stair training;Functional mobility training;Therapeutic activities;Therapeutic exercise;Balance training;Patient/family education;Neuromuscular re-education;Dry needling;Manual techniques;Taping;Passive range of motion    PT Next Visit Plan Continued with TKR rehab    PT Home Exercise Plan QS, ball squeeze, hip abd, sit to stand with red loop around knees    Consulted and Agree with Plan of Care Patient             Patient will benefit from skilled therapeutic intervention in order to improve the following deficits and impairments:  Abnormal gait, Decreased activity tolerance, Decreased mobility, Decreased strength, Decreased range of motion, Difficulty walking, Pain, Improper body mechanics  Visit Diagnosis: Acute pain of left knee  Difficulty in walking, not elsewhere classified  Muscle weakness (generalized)  Other symptoms and signs involving the musculoskeletal system     Problem List Patient Active Problem List   Diagnosis Date Noted   S/P revision of total knee, left 03/22/2021   Screening for colorectal cancer 11/29/2017   Well woman exam with routine gynecological exam 11/29/2017   Change in stool caliber 03/31/2016   Hx of  adenomatous colonic polyps 03/31/2016   Superficial fungus infection of skin 11/25/2015   Urge incontinence 11/04/2014   S/P shoulder replacement 10/08/2014   Constipation - functional 10/31/2013   S/P right TH revision 12/25/2011   S/P right TKA 09/04/2011   Constipation 06/22/2011   Rectal bleeding 06/22/2011   Family history of colonic polyps 06/22/2011    Toniann Fail, PT 04/04/2021, 1:52 PM  [place], [place], [address] Phone: [phone]   Fax:  [phone]  Name: RADIE BERGES MRN: [id number] Date of Birth: December 31, 1950

## 2021-04-06 ENCOUNTER — Encounter (HOSPITAL_COMMUNITY): Payer: Medicare Other | Admitting: Physical Therapy

## 2021-04-06 NOTE — Discharge Summary (Signed)
Physician Discharge Summary   Patient ID: [name] MRN: [id number] DOB/AGE: May 07, 1951 70 y.o.  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    Admission Diagnoses:  Past Medical History:  Diagnosis Date   Anxiety    Constipation    Essential hypertension    Headache    Hyperlipidemia    Insomnia    Obesity    Osteoarthritis    Superficial fungus infection of skin 11/25/2015   Type 2 diabetes mellitus ([place])    Urge incontinence 11/04/2014   Vertigo    Vitamin D deficiency    Discharge Diagnoses:   Active Problems:   S/P revision of total knee, left  Estimated body mass index is 39.91 kg/m as calculated from the following:   Height as of this encounter: 5' 2.75" (1.594 m).   Weight as of this encounter: 101.4 kg.  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    Laboratory Data: Admission on 03/22/2021, Discharged on 03/25/2021  Component Date Value Ref Range Status   Glucose-Capillary 03/22/2021 97  70 - 99 mg/dL Final   Glucose reference range applies only to samples taken after fasting for at least 8 hours.   Glucose-Capillary 03/22/2021 121 (A) 70 - 99 mg/dL Final   Glucose reference range applies only to samples taken after fasting for at least 8 hours.   WBC 03/23/2021 8.4  4.0 - 10.5 K/uL Final   RBC 03/23/2021 3.63 (A) 3.87 - 5.11 MIL/uL Final   Hemoglobin 03/23/2021 9.8 (A) 12.0 - 15.0 g/dL Final   HCT 03/23/2021 30.4 (A) 36.0 - 46.0 % Final   MCV 03/23/2021 83.7  80.0 - 100.0 fL Final   MCH 03/23/2021 27.0  26.0 - 34.0 pg Final   MCHC 03/23/2021 32.2  30.0 - 36.0 g/dL Final   RDW 03/23/2021 14.7  11.5 - 15.5 % Final   Platelets 03/23/2021 285  150 - 400 K/uL Final   nRBC 03/23/2021 0.0  0.0 - 0.2 % Final   Performed  at [facility], [place] [address] [place], [place] [address]   Sodium 03/23/2021 140  135 - 145 mmol/L Final   Potassium 03/23/2021 4.1  3.5 - 5.1 mmol/L Final   Chloride 03/23/2021 108  98 - 111 mmol/L Final   CO2 03/23/2021 24  22 - 32 mmol/L Final   Glucose, Bld 03/23/2021 138 (A) 70 - 99 mg/dL Final   Glucose reference range applies only to samples taken after fasting for at least 8 hours.   BUN 03/23/2021 13  8 - 23 mg/dL Final   Creatinine, Ser 03/23/2021 0.69  0.44 - 1.00 mg/dL Final   Calcium 03/23/2021 9.0  8.9 - 10.3 mg/dL Final   GFR, Estimated 03/23/2021 >60  >60 mL/min Final   Comment: (NOTE) Calculated using the CKD-EPI Creatinine Equation (2021)    Anion gap 03/23/2021 8  5 - 15 Final   Performed at [facility], [place] [address], [place], [place] [address]   Glucose-Capillary 03/23/2021 121 (A) 70 - 99 mg/dL Final   Glucose reference range applies only to samples taken after fasting for at least 8 hours.   WBC 03/24/2021 8.6  4.0 - 10.5 K/uL Final   RBC 03/24/2021 3.43 (A) 3.87 - 5.11 MIL/uL Final   Hemoglobin  03/24/2021 9.2 (A) 12.0 - 15.0 g/dL Final   HCT 03/24/2021 28.7 (A) 36.0 - 46.0 % Final   MCV 03/24/2021 83.7  80.0 - 100.0 fL Final   MCH 03/24/2021 26.8  26.0 - 34.0 pg Final   MCHC 03/24/2021 32.1  30.0 - 36.0 g/dL Final   RDW 03/24/2021 14.8  11.5 - 15.5 % Final   Platelets 03/24/2021 266  150 - 400 K/uL Final   nRBC 03/24/2021 0.0  0.0 - 0.2 % Final   Performed at [facility], [place] [address], [place], [place] [address]  Orders Only on 03/18/2021  Component Date Value Ref Range Status   SARS Coronavirus 2 03/18/2021 RESULT: NEGATIVE   Final   Comment: RESULT: NEGATIVESARS-CoV-2 INTERPRETATION:A NEGATIVE  test result means that SARS-CoV-2 RNA was not present in the specimen above the limit of detection of this test. This does not preclude a possible SARS-CoV-2 infection and should not be used as the  sole  basis for patient management decisions. Negative results must be combined with clinical observations, patient history, and epidemiological information. Optimum specimen types and timing for peak viral levels during infections caused by SARS-CoV-2  have not been determined. Collection of multiple specimens or types of specimens may be necessary to detect virus. Improper specimen collection and handling, sequence variability under primers/probes, or organism present below the limit of detection may  lead to false negative results. Positive and negative predictive values of testing are highly dependent on prevalence. False negative test results are more likely when prevalence of disease is high.The expected result is NEGATIVE.Fact S                          heet for  Healthcare Providers: LocalChronicle.no Sheet for Patients: SalonLookup.es Reference Range - Negative   Hospital Outpatient Visit on 03/10/2021  Component Date Value Ref Range Status   WBC 03/10/2021 3.4 (A) 4.0 - 10.5 K/uL Final   RBC 03/10/2021 4.65  3.87 - 5.11 MIL/uL Final   Hemoglobin 03/10/2021 12.7  12.0 - 15.0 g/dL Final   HCT 03/10/2021 39.1  36.0 - 46.0 % Final   MCV 03/10/2021 84.1  80.0 - 100.0 fL Final   MCH 03/10/2021 27.3  26.0 - 34.0 pg Final   MCHC 03/10/2021 32.5  30.0 - 36.0 g/dL Final   RDW 03/10/2021 15.1  11.5 - 15.5 % Final   Platelets 03/10/2021 266  150 - 400 K/uL Final   nRBC 03/10/2021 0.0  0.0 - 0.2 % Final   Performed at [facility], [place] [address] [place], [place] [address]   Sodium 03/10/2021 139  135 - 145 mmol/L Final   Potassium 03/10/2021 4.0  3.5 - 5.1 mmol/L Final   Chloride 03/10/2021 102  98 - 111 mmol/L Final   CO2 03/10/2021 27  22 - 32 mmol/L Final   Glucose, Bld 03/10/2021 94  70 - 99 mg/dL Final   Glucose reference range applies only to samples taken after fasting for at least 8 hours.   BUN 03/10/2021 15  8  - 23 mg/dL Final   Creatinine, Ser 03/10/2021 0.82  0.44 - 1.00 mg/dL Final   Calcium 03/10/2021 9.9  8.9 - 10.3 mg/dL Final   Total Protein 03/10/2021 7.5  6.5 - 8.1 g/dL Final   Albumin 03/10/2021 4.0  3.5 - 5.0 g/dL Final   AST 03/10/2021 28  15 - 41 U/L Final   ALT 03/10/2021 21  0 - 44 U/L Final   Alkaline Phosphatase 03/10/2021 59  38 - 126 U/L Final   Total Bilirubin 03/10/2021 0.7  0.3 - 1.2 mg/dL Final   GFR, Estimated 03/10/2021 >60  >60 mL/min Final   Comment: (NOTE) Calculated using the CKD-EPI Creatinine Equation (2021)    Anion gap 03/10/2021 10  5 - 15 Final   Performed at [facility], [place] [address], [place], [place] [address]   ABO/RH(D) 03/10/2021 O NEG   Final   Antibody Screen 03/10/2021 NEG   Final   Sample Expiration 03/10/2021 03/24/2021,2359   Final   Extend sample reason 03/10/2021    Final                   Value:NO TRANSFUSIONS OR PREGNANCY IN THE PAST 3 MONTHS Performed at [place] [address], [place], [place] [address]    Hgb A1c MFr Bld 03/10/2021 5.7 (A) 4.8 - 5.6 % Final   Comment: (NOTE) Pre diabetes:          5.7%-6.4%  Diabetes:              >6.4%  Glycemic control for   <7.0% adults with diabetes    Mean Plasma Glucose 03/10/2021 116.89  mg/dL Final   Performed at [facility], [place] [address] [place], [place] [address]   MRSA, PCR 03/10/2021 NEGATIVE  NEGATIVE Final   Staphylococcus aureus 03/10/2021 NEGATIVE  NEGATIVE Final   Comment: (NOTE) The Xpert SA Assay (FDA approved for NASAL specimens in patients 17 years of age and older), is one component of a comprehensive surveillance program. It is not intended to diagnose infection nor to guide or monitor treatment. Performed at [facility], [place] [address] [place], [place] [address]    Glucose-Capillary 03/10/2021 102 (A) 70 - 99 mg/dL Final   Glucose reference range applies only to samples taken after fasting for  at least 8 hours.     X-Rays:DG Knee 1-2 Views Left  Result Date: 03/23/2021 CLINICAL DATA:  Left knee revision yesterday. EXAM: LEFT KNEE - 1-2 VIEW COMPARISON:  None. FINDINGS: Left knee replacement in satisfactory position and alignment. Prosthesis in satisfactory position. No fracture or acute bony abnormality. Knee joint effusion with gas in the joint is present from recent surgery. IMPRESSION: Left knee replacement without immediate complication. Electronically Signed   By: Franchot Gallo M.D.   On: 03/23/2021 14:28    EKG: Orders placed or performed in visit on 08/31/20   EKG 12-Lead     Hospital Course: [name] is a 70 y.o. who was admitted to [facility]. They were brought to the operating room on 03/22/2021 and underwent Procedure(s): [employer].  Patient tolerated the procedure well and was later transferred to the recovery room and then to the orthopaedic floor for postoperative care. They were given PO and IV analgesics for pain control following their surgery. They were given 24 hours of postoperative antibiotics of  Anti-infectives (From admission, onward)    Start     Dose/Rate Route Frequency Ordered Stop   03/23/21 1000  hydroxychloroquine (PLAQUENIL) tablet 400 mg  Status:  Discontinued        400 mg Oral Daily 03/22/21 2137 03/25/21 1728   03/23/21 0600  ceFAZolin (ANCEF) IVPB 2g/100 mL premix        2 g 200 mL/hr over 30 Minutes Intravenous On call to O.R. 03/22/21 1317 03/22/21 1553  03/22/21 2200  ceFAZolin (ANCEF) IVPB 2g/100 mL premix        2 g 200 mL/hr over 30 Minutes Intravenous Every 6 hours 03/22/21 2137 03/23/21 0403      and started on DVT prophylaxis in the form of Aspirin.   PT and OT were ordered for total joint protocol. Discharge planning consulted to help with postop disposition and equipment needs. Patient had a good night on the evening of surgery. They started to get up OOB with therapy on POD #1.  Continued to work with  therapy into POD #2. Dr. Alvan Dame examined her knee as she was having some catching with active knee motion noted in therapy. This was felt to be more of a soft tissue impingement issue rather than component issue. She continued working with PT. Pt was seen during rounds on day three  and was ready to go home pending progress with therapy. Pt worked with therapy for one additional session and was meeting their goals. She was discharged to home later that day in stable condition.  Diet: Regular diet Activity: PWB Follow-up: in 2 weeks Disposition: Home Discharged Condition: good   Discharge Instructions     Call MD / Call 911   Complete by: As directed    If you experience chest pain or shortness of breath, CALL 911 and be transported to the hospital emergency room.  If you develope a fever above 101 F, pus (white drainage) or increased drainage or redness at the wound, or calf pain, call your surgeon's office.   Change dressing   Complete by: As directed    Maintain surgical dressing until follow up in the clinic. If the edges start to pull up, may reinforce with tape. If the dressing is no longer working, may remove and cover with gauze and tape, but must keep the area dry and clean.  Call with any questions or concerns.   Constipation Prevention   Complete by: As directed    Drink plenty of fluids.  Prune juice may be helpful.  You may use a stool softener, such as Colace (over the counter) 100 mg twice a day.  Use MiraLax (over the counter) for constipation as needed.   Diet - low sodium heart healthy   Complete by: As directed    Increase activity slowly as tolerated   Complete by: As directed    Partial weight bearing with assist device as directed.   Post-operative opioid taper instructions:   Complete by: As directed    POST-OPERATIVE OPIOID TAPER INSTRUCTIONS: It is important to wean off of your opioid medication as soon as possible. If you do not need pain medication after your  surgery it is ok to stop day one. Opioids include: Codeine, Hydrocodone(Norco, Vicodin), Oxycodone(Percocet, oxycontin) and hydromorphone amongst others.  Long term and even short term use of opiods can cause: Increased pain response Dependence Constipation Depression Respiratory depression And more.  Withdrawal symptoms can include Flu like symptoms Nausea, vomiting And more Techniques to manage these symptoms Hydrate well Eat regular healthy meals Stay active Use relaxation techniques(deep breathing, meditating, yoga) Do Not substitute Alcohol to help with tapering If you have been on opioids for less than two weeks and do not have pain than it is ok to stop all together.  Plan to wean off of opioids This plan should start within one week post op of your joint replacement. Maintain the same interval or time between taking each dose and first decrease the dose.  Cut the total daily intake of opioids by one tablet each day Next start to increase the time between doses. The last dose that should be eliminated is the evening dose.      TED hose   Complete by: As directed    Use stockings (TED hose) for 2 weeks on both leg(s).  You may remove them at night for sleeping.      Allergies as of 03/25/2021   No Known Allergies      Medication List     STOP taking these medications    aspirin EC 81 MG tablet Replaced by: aspirin 81 MG chewable tablet   FISH OIL PO   phentermine 37.5 MG tablet Commonly known as: ADIPEX-P       TAKE these medications    aspirin 81 MG chewable tablet Chew 1 tablet (81 mg total) by mouth 2 (two) times daily for 28 days. Replaces: aspirin EC 81 MG tablet   celecoxib 200 MG capsule Commonly known as: CELEBREX Take 1 capsule (200 mg total) by mouth 2 (two) times daily.   cholecalciferol 25 MCG (1000 UNIT) tablet Commonly known as: VITAMIN D Take 1,000 Units by mouth daily.   docusate sodium 100 MG capsule Commonly known as:  COLACE Take 1 capsule (100 mg total) by mouth 2 (two) times daily.   hydroxychloroquine 200 MG tablet Commonly known as: PLAQUENIL Take 400 mg by mouth daily.   lisinopril-hydrochlorothiazide 20-12.5 MG tablet Commonly known as: ZESTORETIC Take 1 tablet by mouth daily before breakfast.   methocarbamol 500 MG tablet Commonly known as: ROBAXIN Take 1 tablet (500 mg total) by mouth every 6 (six) hours as needed for muscle spasms.   multivitamin tablet Take 1 tablet by mouth daily. Centrum   oxyCODONE 5 MG immediate release tablet Commonly known as: Oxy IR/ROXICODONE Take 1-2 tablets (5-10 mg total) by mouth every 4 (four) hours as needed for severe pain.   polyethylene glycol powder 17 GM/SCOOP powder Commonly known as: GLYCOLAX/MIRALAX Take 1 Container by mouth daily.   zolpidem 10 MG tablet Commonly known as: AMBIEN Take 1 tablet by mouth at bedtime as needed and may repeat dose one time if needed for sleep.               Discharge Care Instructions  (From admission, onward)           Start     Ordered   03/25/21 0000  Change dressing       Comments: Maintain surgical dressing until follow up in the clinic. If the edges start to pull up, may reinforce with tape. If the dressing is no longer working, may remove and cover with gauze and tape, but must keep the area dry and clean.  Call with any questions or concerns.   03/25/21 0736            Follow-up Information     Paralee Cancel, MD. Schedule an appointment as soon as possible for a visit in 2 week(s).   Specialty: Orthopedic Surgery Contact information: [address] [place] [address] [phone]                 Signed: Griffith Citron, PA-C Orthopedic Surgery 04/06/2021, 12:31 PM

## 2021-04-08 ENCOUNTER — Ambulatory Visit (HOSPITAL_COMMUNITY): Payer: Medicare Other | Admitting: Physical Therapy

## 2021-04-08 ENCOUNTER — Encounter (HOSPITAL_COMMUNITY): Payer: Self-pay | Admitting: Physical Therapy

## 2021-04-08 ENCOUNTER — Other Ambulatory Visit: Payer: Self-pay

## 2021-04-08 DIAGNOSIS — M25562 Pain in left knee: Secondary | ICD-10-CM

## 2021-04-08 DIAGNOSIS — M6281 Muscle weakness (generalized): Secondary | ICD-10-CM

## 2021-04-08 DIAGNOSIS — R262 Difficulty in walking, not elsewhere classified: Secondary | ICD-10-CM | POA: Diagnosis not present

## 2021-04-08 DIAGNOSIS — R29898 Other symptoms and signs involving the musculoskeletal system: Secondary | ICD-10-CM

## 2021-04-08 NOTE — Therapy (Signed)
[place], [place], [address] Phone: [phone]   Fax:  [phone]  Physical Therapy Treatment  Patient Details  Name: [name] MRN: [id number] Date of Birth: Apr 14, 1951 Referring Provider (PT): Dr. Paralee Cancel   Encounter Date: 04/08/2021   PT End of Session - 04/08/21 1332     Visit Number 4    Number of Visits 18    Date for PT Re-Evaluation 05/09/21    Authorization Type [employer] H[employer], auth required    PT Start Time 5366    PT Stop Time 1400    PT Time Calculation (min) 43 min    Activity Tolerance Patient tolerated treatment well    Behavior During Therapy [facility] for tasks assessed/performed             Past Medical History:  Diagnosis Date   Anxiety    Constipation    Essential hypertension    Headache    Hyperlipidemia    Insomnia    Obesity    Osteoarthritis    Superficial fungus infection of skin 11/25/2015   Type 2 diabetes mellitus ([place])    Urge incontinence 11/04/2014   Vertigo    Vitamin D deficiency     Past Surgical History:  Procedure Laterality Date   [place] Right 06/2016   right middle finger fused; wire in right thumb   COLONOSCOPY  10/20/02   normal rectum/left-sided diverticula   COLONOSCOPY  07/06/2011   Dr. Gala RomneyDionisio David anal canal hemorrhoids, diminutive rectal and transverse colon polyps. 2 adenoma on path. Left-sided diverticulosis.   COLONOSCOPY N/A 04/21/2016   Procedure: COLONOSCOPY;  Surgeon: Daneil Dolin, MD;  [facility];  Service: Endoscopy;  Laterality: N/A;  930    JOINT REPLACEMENT     PARTIAL HYSTERECTOMY  1987   TOE SURGERY     second toe on both feet, bunionectomy   TOTAL HIP ARTHROPLASTY  2001/2008   multiple surgeries on right hip including replacements. Two replacements and four dislocations.    TOTAL HIP ARTHROPLASTY  2010   left   TOTAL HIP REVISION  12/25/2011    Procedure: TOTAL HIP REVISION;  Surgeon: Mauri Pole, MD;  [facility];  Service: Orthopedics;  Laterality: Right;   TOTAL KNEE ARTHROPLASTY  2008   left   TOTAL KNEE ARTHROPLASTY  09/04/2011   Procedure: TOTAL KNEE ARTHROPLASTY;  Surgeon: Mauri Pole, MD;  [facility];  Service: Orthopedics;  Laterality: Right;   TOTAL KNEE REVISION Left 03/22/2021   Procedure: TOTAL KNEE REVISION;  Surgeon: Paralee Cancel, MD;  [facility];  Service: Orthopedics;  Laterality: Left;   TOTAL SHOULDER ARTHROPLASTY Left 10/08/2014   Procedure: LEFT TOTAL SHOULDER ARTHROPLASTY;  Surgeon: Justice Britain, MD;  Location: [place];  Service: Orthopedics;  Laterality: Left;   TOTAL SHOULDER REPLACEMENT Left 10/08/2014   dr supple    There were no vitals filed for this visit.   Subjective Assessment - 04/08/21 1313     Subjective Pt states that due to the klunking she has not been doing her exercises as much.  PT states MD does not want her to go to a cane until he sees her next.    Limitations Sitting;Walking;Standing;House hold activities    How long can you sit comfortably? no problem.    Patient Stated Goals to walk with no assistive device    Currently in Pain?  No/denies    Pain Score 0-No pain    Pain Onset In the past 7 days                               [facility] Adult PT Treatment/Exercise - 04/08/21 0001       Exercises   Exercises Knee/Hip      Knee/Hip Exercises: Stretches   Knee: Self-Stretch to increase Flexion Left;3 reps;30 seconds      Knee/Hip Exercises: Aerobic   Recumbent Bike 4'      Knee/Hip Exercises: Standing   Heel Raises Both;10 reps    Heel Raises Limitations toe raises x 10    Terminal Knee Extension Strengthening;Left;10 reps    Forward Step Up Left;10 reps;Step Height: 4";Hand Hold: 2    Rocker Board 2 minutes    SLS x 3 B      Knee/Hip Exercises: Seated   Sit to Sand 10 reps;without UE support      Knee/Hip Exercises: Supine   Quad  Sets 15 reps;Left    Short Higher education careers adviser reps    Short Arc Quad Sets Limitations 3#    Straight Leg Raises Left;10 reps    Knee Extension Limitations 0    Knee Flexion Limitations 130      Manual Therapy   Manual Therapy Joint mobilization    Manual therapy comments completed separate from all aspects of treatment    Joint Mobilization patella mobs                       PT Short Term Goals - 04/08/21 1348       PT SHORT TERM GOAL #1   Title Patient will be independent with HEP in order to improve functional outcomes.    Time 3    Period Weeks    Status On-going    Target Date 04/18/21      PT SHORT TERM GOAL #2   Title Patient will report at least 25% improvement in symptoms for improved quality of life.    Time 3    Period Weeks    Status On-going    Target Date 04/18/21      PT SHORT TERM GOAL #3   Title Demo full left knee extension, and 110 degree left knee flexion to enable normalized gait mechanics and transfers    Baseline 3 lacking, 90 degree flexion    Time 3    Period Weeks    Status On-going    Target Date 04/18/21      PT SHORT TERM GOAL #4   Title Patient will be able to ambulate at least 250 feet in 2MWT in order to demonstrate improved gait speed for community ambulation.    Baseline 180 ft with cane    Time 3    Period Weeks    Status On-going    Target Date 04/18/21               PT Long Term Goals - 04/08/21 1348       PT LONG TERM GOAL #1   Title Patient will improve FOTO score by at least 5 points in order to indicate improved tolerance to activity.    Baseline 60% function    Time 6    Period Weeks    Status On-going      PT LONG TERM GOAL #2   Title Patient will be able  to navigate stairs with reciprocal pattern without compensation in order to demonstrate improved LE strength.    Baseline step-to    Time 6    Period Weeks    Status On-going                   Plan - 04/08/21 1333      Clinical Impression Statement Pt continues to have clunking which does not increase with increased stress, ie as much with vs without resistance. LE tends to go into IR and pt must be reminded to keep knee cap straight ahead and not in.    Personal Factors and Comorbidities Age;Time since onset of injury/illness/exacerbation;Past/Current Experience    Examination-Activity Limitations Bend;Carry;Lift;Stand;Stairs;Squat;Locomotion Level;Transfers    Examination-Participation Restrictions Cleaning;Yard Work;Shop;Driving;Meal Prep    Stability/Clinical Decision Making Stable/Uncomplicated    Rehab Potential Good    PT Frequency 3x / week    PT Duration 6 weeks    PT Treatment/Interventions ADLs/Self Care Home Management;Gait training;Stair training;Functional mobility training;Therapeutic activities;Therapeutic exercise;Balance training;Patient/family education;Neuromuscular re-education;Dry needling;Manual techniques;Taping;Passive range of motion    PT Next Visit Plan Continued with TKR rehab    PT Home Exercise Plan QS, ball squeeze, hip abd, sit to stand with red loop around knees; 9/30:  Green tband  sitting for knee extension and flexion    Consulted and Agree with Plan of Care Patient             Patient will benefit from skilled therapeutic intervention in order to improve the following deficits and impairments:  Abnormal gait, Decreased activity tolerance, Decreased mobility, Decreased strength, Decreased range of motion, Difficulty walking, Pain, Improper body mechanics  Visit Diagnosis: Acute pain of left knee  Difficulty in walking, not elsewhere classified  Muscle weakness (generalized)  Other symptoms and signs involving the musculoskeletal system     Problem List Patient Active Problem List   Diagnosis Date Noted   S/P revision of total knee, left 03/22/2021   Screening for colorectal cancer 11/29/2017   Well woman exam with routine gynecological exam 11/29/2017    Change in stool caliber 03/31/2016   Hx of adenomatous colonic polyps 03/31/2016   Superficial fungus infection of skin 11/25/2015   Urge incontinence 11/04/2014   S/P shoulder replacement 10/08/2014   Constipation - functional 10/31/2013   S/P right TH revision 12/25/2011   S/P right TKA 09/04/2011   Constipation 06/22/2011   Rectal bleeding 06/22/2011   Family history of colonic polyps 06/22/2011   Rayetta Humphrey, PT CLT [id number]  04/08/2021, 2:00 PM  [place], [place], [address] Phone: [phone]   Fax:  [phone]  Name: [name] MRN: [id number] Date of Birth: 17-Mar-1951

## 2021-04-11 ENCOUNTER — Other Ambulatory Visit: Payer: Self-pay

## 2021-04-11 ENCOUNTER — Ambulatory Visit (HOSPITAL_COMMUNITY): Payer: Medicare Other | Attending: Student

## 2021-04-11 DIAGNOSIS — R262 Difficulty in walking, not elsewhere classified: Secondary | ICD-10-CM | POA: Insufficient documentation

## 2021-04-11 DIAGNOSIS — R29898 Other symptoms and signs involving the musculoskeletal system: Secondary | ICD-10-CM | POA: Insufficient documentation

## 2021-04-11 DIAGNOSIS — M6281 Muscle weakness (generalized): Secondary | ICD-10-CM | POA: Diagnosis not present

## 2021-04-11 DIAGNOSIS — M25562 Pain in left knee: Secondary | ICD-10-CM | POA: Insufficient documentation

## 2021-04-11 NOTE — Therapy (Signed)
[place], [place], [address] Phone: [phone]   Fax:  [phone]  Physical Therapy Treatment  Patient Details  Name: [name] MRN: [id number] Date of Birth: Dec 10, 1950 Referring Provider (PT): Dr. Paralee Cancel   Encounter Date: 04/11/2021   PT End of Session - 04/11/21 1300     Visit Number 5    Number of Visits 18    Date for PT Re-Evaluation 05/09/21    Authorization Type [employer] H[employer], auth required    PT Start Time 28    PT Stop Time 6962    PT Time Calculation (min) 45 min    Activity Tolerance Patient tolerated treatment well    Behavior During Therapy [facility] for tasks assessed/performed             Past Medical History:  Diagnosis Date   Anxiety    Constipation    Essential hypertension    Headache    Hyperlipidemia    Insomnia    Obesity    Osteoarthritis    Superficial fungus infection of skin 11/25/2015   Type 2 diabetes mellitus ([place])    Urge incontinence 11/04/2014   Vertigo    Vitamin D deficiency     Past Surgical History:  Procedure Laterality Date   [place] Right 06/2016   right middle finger fused; wire in right thumb   COLONOSCOPY  10/20/02   normal rectum/left-sided diverticula   COLONOSCOPY  07/06/2011   Dr. Gala RomneyDionisio David anal canal hemorrhoids, diminutive rectal and transverse colon polyps. 2 adenoma on path. Left-sided diverticulosis.   COLONOSCOPY N/A 04/21/2016   Procedure: COLONOSCOPY;  Surgeon: Daneil Dolin, MD;  [facility];  Service: Endoscopy;  Laterality: N/A;  930    JOINT REPLACEMENT     PARTIAL HYSTERECTOMY  1987   TOE SURGERY     second toe on both feet, bunionectomy   TOTAL HIP ARTHROPLASTY  2001/2008   multiple surgeries on right hip including replacements. Two replacements and four dislocations.    TOTAL HIP ARTHROPLASTY  2010   left   TOTAL HIP REVISION  12/25/2011    Procedure: TOTAL HIP REVISION;  Surgeon: Mauri Pole, MD;  [facility];  Service: Orthopedics;  Laterality: Right;   TOTAL KNEE ARTHROPLASTY  2008   left   TOTAL KNEE ARTHROPLASTY  09/04/2011   Procedure: TOTAL KNEE ARTHROPLASTY;  Surgeon: Mauri Pole, MD;  [facility];  Service: Orthopedics;  Laterality: Right;   TOTAL KNEE REVISION Left 03/22/2021   Procedure: TOTAL KNEE REVISION;  Surgeon: Paralee Cancel, MD;  [facility];  Service: Orthopedics;  Laterality: Left;   TOTAL SHOULDER ARTHROPLASTY Left 10/08/2014   Procedure: LEFT TOTAL SHOULDER ARTHROPLASTY;  Surgeon: Justice Britain, MD;  Location: [place];  Service: Orthopedics;  Laterality: Left;   TOTAL SHOULDER REPLACEMENT Left 10/08/2014   dr supple    There were no vitals filed for this visit.   Subjective Assessment - 04/11/21 1310     Subjective Continues to experience crepitus and klunking in her left knee with pain along anterior thigh at quad tendon    Limitations Sitting;Walking;Standing;House hold activities    How long can you sit comfortably? no problem.    Patient Stated Goals to walk with no assistive device    Currently in Pain? No/denies    Pain Score 0-No pain    Pain Onset In the past  7 days                [facility] PT Assessment - 04/11/21 0001       Assessment   Medical Diagnosis Left TKR    Referring Provider (PT) Dr. Paralee Cancel                           [facility] Adult PT Treatment/Exercise - 04/11/21 0001       Knee/Hip Exercises: Standing   Knee Flexion Strengthening;Left;3 sets;10 reps    Knee Flexion Limitations 5#    Hip Flexion Stengthening;Left;3 sets;10 reps    Hip Flexion Limitations 5#    Terminal Knee Extension Strengthening;Left;3 sets;10 reps    Terminal Knee Extension Limitations 2 PLATES      Knee/Hip Exercises: Seated   Long Arc Quad Strengthening;Left;3 sets;10 reps    Long Arc Quad Weight 5 lbs.    Long [employer] Limitations limiting range to stop  before patellar shift    Sit to [employer] 10 reps;without UE support   with arch support to left foot                    PT Education - 04/11/21 1334     Education Details educated on foot position and its effects on knee position e.g. pes planus vs active arch    Person(s) Educated Patient    Methods Explanation;Demonstration    Comprehension Verbalized understanding;Returned demonstration              PT Short Term Goals - 04/08/21 1348       PT SHORT TERM GOAL #1   Title Patient will be independent with HEP in order to improve functional outcomes.    Time 3    Period Weeks    Status On-going    Target Date 04/18/21      PT SHORT TERM GOAL #2   Title Patient will report at least 25% improvement in symptoms for improved quality of life.    Time 3    Period Weeks    Status On-going    Target Date 04/18/21      PT SHORT TERM GOAL #3   Title Demo full left knee extension, and 110 degree left knee flexion to enable normalized gait mechanics and transfers    Baseline 3 lacking, 90 degree flexion    Time 3    Period Weeks    Status On-going    Target Date 04/18/21      PT SHORT TERM GOAL #4   Title Patient will be able to ambulate at least 250 feet in 2MWT in order to demonstrate improved gait speed for community ambulation.    Baseline 180 ft with cane    Time 3    Period Weeks    Status On-going    Target Date 04/18/21               PT Long Term Goals - 04/08/21 1348       PT LONG TERM GOAL #1   Title Patient will improve FOTO score by at least 5 points in order to indicate improved tolerance to activity.    Baseline 60% function    Time 6    Period Weeks    Status On-going      PT LONG TERM GOAL #2   Title Patient will be able to navigate stairs with reciprocal pattern without compensation in order to demonstrate improved  LE strength.    Baseline step-to    Time 6    Period Weeks    Status On-going                   Plan -  04/11/21 1340     Clinical Impression Statement Continued crepitus, patellofemoral tracking issues noted LLE especially as she proceeds to end-range knee extension in open chain and closed chain positions. Crepitus in left knee noted throughout ROM when in [place] position. Continued sessions to improve LLE strength/stability to progress ambulation capabilities. Perhaps her pes planus/over pronation are contributing to her fixed and dynamic left knee valgus contributing to these issues    Personal Factors and Comorbidities Age;Time since onset of injury/illness/exacerbation;Past/Current Experience    Examination-Activity Limitations Bend;Carry;Lift;Stand;Stairs;Squat;Locomotion Level;Transfers    Examination-Participation Restrictions Cleaning;Yard Work;Shop;Driving;Meal Prep    Stability/Clinical Decision Making Stable/Uncomplicated    Rehab Potential Good    PT Frequency 3x / week    PT Duration 6 weeks    PT Treatment/Interventions ADLs/Self Care Home Management;Gait training;Stair training;Functional mobility training;Therapeutic activities;Therapeutic exercise;Balance training;Patient/family education;Neuromuscular re-education;Dry needling;Manual techniques;Taping;Passive range of motion    PT Next Visit Plan Continued with TKR rehab    PT Home Exercise Plan QS, ball squeeze, hip abd, sit to stand with red loop around knees; 9/30:  Green tband  sitting for knee extension and flexion    Consulted and Agree with Plan of Care Patient             Patient will benefit from skilled therapeutic intervention in order to improve the following deficits and impairments:  Abnormal gait, Decreased activity tolerance, Decreased mobility, Decreased strength, Decreased range of motion, Difficulty walking, Pain, Improper body mechanics  Visit Diagnosis: Acute pain of left knee  Difficulty in walking, not elsewhere classified  Muscle weakness (generalized)  Other symptoms and signs involving the  musculoskeletal system     Problem List Patient Active Problem List   Diagnosis Date Noted   S/P revision of total knee, left 03/22/2021   Screening for colorectal cancer 11/29/2017   Well woman exam with routine gynecological exam 11/29/2017   Change in stool caliber 03/31/2016   Hx of adenomatous colonic polyps 03/31/2016   Superficial fungus infection of skin 11/25/2015   Urge incontinence 11/04/2014   S/P shoulder replacement 10/08/2014   Constipation - functional 10/31/2013   S/P right TH revision 12/25/2011   S/P right TKA 09/04/2011   Constipation 06/22/2011   Rectal bleeding 06/22/2011   Family history of colonic polyps 06/22/2011    Toniann Fail, PT 04/11/2021, 1:56 PM  [place], [place], [address] Phone: [phone]   Fax:  [phone]  Name: MA MUNOZ MRN: [id number] Date of Birth: [birth year]

## 2021-04-13 ENCOUNTER — Encounter (HOSPITAL_COMMUNITY): Payer: Medicare Other | Admitting: Physical Therapy

## 2021-04-15 ENCOUNTER — Other Ambulatory Visit: Payer: Self-pay

## 2021-04-15 ENCOUNTER — Ambulatory Visit (HOSPITAL_COMMUNITY): Payer: Medicare Other | Admitting: Physical Therapy

## 2021-04-15 DIAGNOSIS — R262 Difficulty in walking, not elsewhere classified: Secondary | ICD-10-CM | POA: Diagnosis not present

## 2021-04-15 DIAGNOSIS — M25562 Pain in left knee: Secondary | ICD-10-CM | POA: Diagnosis not present

## 2021-04-15 DIAGNOSIS — M6281 Muscle weakness (generalized): Secondary | ICD-10-CM

## 2021-04-15 DIAGNOSIS — R29898 Other symptoms and signs involving the musculoskeletal system: Secondary | ICD-10-CM

## 2021-04-15 NOTE — Therapy (Signed)
[place], [place], [address] Phone: [phone]   Fax:  [phone]  Physical Therapy Treatment  Patient Details  Name: MAELY CLEMENTS MRN: [id number] Date of Birth: 1951-02-02 Referring Provider (PT): Dr. Paralee Cancel   Encounter Date: 04/15/2021   PT End of Session - 04/15/21 1356     Visit Number 6    Number of Visits 18    Date for PT Re-Evaluation 05/09/21    Authorization Type [employer] H[employer], auth required    PT Start Time 1315    PT Stop Time 1356    PT Time Calculation (min) 41 min    Activity Tolerance Patient tolerated treatment well    Behavior During Therapy [facility] for tasks assessed/performed             Past Medical History:  Diagnosis Date   Anxiety    Constipation    Essential hypertension    Headache    Hyperlipidemia    Insomnia    Obesity    Osteoarthritis    Superficial fungus infection of skin 11/25/2015   Type 2 diabetes mellitus ([place])    Urge incontinence 11/04/2014   Vertigo    Vitamin D deficiency     Past Surgical History:  Procedure Laterality Date   [place] Right 06/2016   right middle finger fused; wire in right thumb   COLONOSCOPY  10/20/02   normal rectum/left-sided diverticula   COLONOSCOPY  07/06/2011   Dr. Gala RomneyDionisio David anal canal hemorrhoids, diminutive rectal and transverse colon polyps. 2 adenoma on path. Left-sided diverticulosis.   COLONOSCOPY N/A 04/21/2016   Procedure: COLONOSCOPY;  Surgeon: Daneil Dolin, MD;  [facility];  Service: Endoscopy;  Laterality: N/A;  930    JOINT REPLACEMENT     PARTIAL HYSTERECTOMY  1987   TOE SURGERY     second toe on both feet, bunionectomy   TOTAL HIP ARTHROPLASTY  2001/2008   multiple surgeries on right hip including replacements. Two replacements and four dislocations.    TOTAL HIP ARTHROPLASTY  2010   left   TOTAL HIP REVISION  12/25/2011    Procedure: TOTAL HIP REVISION;  Surgeon: Mauri Pole, MD;  [facility];  Service: Orthopedics;  Laterality: Right;   TOTAL KNEE ARTHROPLASTY  2008   left   TOTAL KNEE ARTHROPLASTY  09/04/2011   Procedure: TOTAL KNEE ARTHROPLASTY;  Surgeon: Mauri Pole, MD;  [facility];  Service: Orthopedics;  Laterality: Right;   TOTAL KNEE REVISION Left 03/22/2021   Procedure: TOTAL KNEE REVISION;  Surgeon: Paralee Cancel, MD;  [facility];  Service: Orthopedics;  Laterality: Left;   TOTAL SHOULDER ARTHROPLASTY Left 10/08/2014   Procedure: LEFT TOTAL SHOULDER ARTHROPLASTY;  Surgeon: Justice Britain, MD;  Location: [place];  Service: Orthopedics;  Laterality: Left;   TOTAL SHOULDER REPLACEMENT Left 10/08/2014   dr supple    There were no vitals filed for this visit.   Subjective Assessment - 04/15/21 1314     Subjective PT states that her knee feels better.  Sit to stand from lower heights is the hardest thing for the pt to do at this point    Limitations Sitting;Walking;Standing;House hold activities    How long can you sit comfortably? no problem.    Patient Stated Goals to walk with no assistive device    Currently in Pain? Yes    Pain Score 3  Pain Location Knee    Pain Orientation Left    Pain Descriptors / Indicators Aching    Pain Type Surgical pain    Pain Onset In the past 7 days    Aggravating Factors  prolong activity    Pain Relieving Factors rest                               [facility] Adult PT Treatment/Exercise - 04/15/21 0001       Exercises   Exercises Knee/Hip      Knee/Hip Exercises: Machines for Strengthening   Total Gym Leg Press 2 Pl x10, 4pl x10      Knee/Hip Exercises: Standing   Heel Raises 15 reps    Knee Flexion Strengthening;Left;3 sets;10 reps    Knee Flexion Limitations 5#    Terminal Knee Extension Strengthening;Left;10 reps    Forward Step Up Left;10 reps;Step Height: 4";Hand Hold: 2    SLS x 3 B      Knee/Hip Exercises:  Seated   Sit to Sand 10 reps   from lowest point on mat arms reaching forwad for momentum     Knee/Hip Exercises: Supine   Short Arc Quad Sets Left;15 reps    Short Arc Quad Sets Limitations 5    Straight Leg Raises Left;10 reps      Knee/Hip Exercises: Prone   Hamstring Curl 15 reps   from 2" stp and up to stop clunking   Hamstring Curl Limitations 6                       PT Short Term Goals - 04/08/21 1348       PT SHORT TERM GOAL #1   Title Patient will be independent with HEP in order to improve functional outcomes.    Time 3    Period Weeks    Status On-going    Target Date 04/18/21      PT SHORT TERM GOAL #2   Title Patient will report at least 25% improvement in symptoms for improved quality of life.    Time 3    Period Weeks    Status On-going    Target Date 04/18/21      PT SHORT TERM GOAL #3   Title Demo full left knee extension, and 110 degree left knee flexion to enable normalized gait mechanics and transfers    Baseline 3 lacking, 90 degree flexion    Time 3    Period Weeks    Status On-going    Target Date 04/18/21      PT SHORT TERM GOAL #4   Title Patient will be able to ambulate at least 250 feet in 2MWT in order to demonstrate improved gait speed for community ambulation.    Baseline 180 ft with cane    Time 3    Period Weeks    Status On-going    Target Date 04/18/21               PT Long Term Goals - 04/08/21 1348       PT LONG TERM GOAL #1   Title Patient will improve FOTO score by at least 5 points in order to indicate improved tolerance to activity.    Baseline 60% function    Time 6    Period Weeks    Status On-going      PT LONG TERM GOAL #2   Title  Patient will be able to navigate stairs with reciprocal pattern without compensation in order to demonstrate improved LE strength.    Baseline step-to    Time 6    Period Weeks    Status On-going                   Plan - 04/15/21 1357     Clinical  Impression Statement Therapist attempted to modify all exercises to eliminate clunking while completing exercises.  Reminded pt to be more aware and keep the knee gending straight instead of collapsing medially.    Personal Factors and Comorbidities Age;Time since onset of injury/illness/exacerbation;Past/Current Experience    Examination-Activity Limitations Bend;Carry;Lift;Stand;Stairs;Squat;Locomotion Level;Transfers    Examination-Participation Restrictions Cleaning;Yard Work;Shop;Driving;Meal Prep    Stability/Clinical Decision Making Stable/Uncomplicated    Rehab Potential Good    PT Frequency 3x / week    PT Duration 6 weeks    PT Treatment/Interventions ADLs/Self Care Home Management;Gait training;Stair training;Functional mobility training;Therapeutic activities;Therapeutic exercise;Balance training;Patient/family education;Neuromuscular re-education;Dry needling;Manual techniques;Taping;Passive range of motion    PT Next Visit Plan Continued with TKR rehab    PT Home Exercise Plan QS, ball squeeze, hip abd, sit to stand with red loop around knees; 9/30:  Green tband  sitting for knee extension and flexion    Consulted and Agree with Plan of Care Patient             Patient will benefit from skilled therapeutic intervention in order to improve the following deficits and impairments:  Abnormal gait, Decreased activity tolerance, Decreased mobility, Decreased strength, Decreased range of motion, Difficulty walking, Pain, Improper body mechanics  Visit Diagnosis: Acute pain of left knee  Difficulty in walking, not elsewhere classified  Muscle weakness (generalized)  Other symptoms and signs involving the musculoskeletal system     Problem List Patient Active Problem List   Diagnosis Date Noted   S/P revision of total knee, left 03/22/2021   Screening for colorectal cancer 11/29/2017   Well woman exam with routine gynecological exam 11/29/2017   Change in stool caliber  03/31/2016   Hx of adenomatous colonic polyps 03/31/2016   Superficial fungus infection of skin 11/25/2015   Urge incontinence 11/04/2014   S/P shoulder replacement 10/08/2014   Constipation - functional 10/31/2013   S/P right TH revision 12/25/2011   S/P right TKA 09/04/2011   Constipation 06/22/2011   Rectal bleeding 06/22/2011   Family history of colonic polyps 06/22/2011    Rayetta Humphrey, PT CLT [phone]  PT 04/15/2021, 2:03 PM  [place], [place], [address] Phone: [phone]   Fax:  [phone]  Name: LATERRICA LIBMAN MRN: [id number] Date of Birth: [birth year]

## 2021-04-18 ENCOUNTER — Encounter (HOSPITAL_COMMUNITY): Payer: Self-pay | Admitting: Physical Therapy

## 2021-04-18 ENCOUNTER — Other Ambulatory Visit: Payer: Self-pay

## 2021-04-18 ENCOUNTER — Ambulatory Visit (HOSPITAL_COMMUNITY): Payer: Medicare Other | Admitting: Physical Therapy

## 2021-04-18 DIAGNOSIS — M25562 Pain in left knee: Secondary | ICD-10-CM | POA: Diagnosis not present

## 2021-04-18 DIAGNOSIS — R29898 Other symptoms and signs involving the musculoskeletal system: Secondary | ICD-10-CM

## 2021-04-18 DIAGNOSIS — R262 Difficulty in walking, not elsewhere classified: Secondary | ICD-10-CM

## 2021-04-18 DIAGNOSIS — M6281 Muscle weakness (generalized): Secondary | ICD-10-CM | POA: Diagnosis not present

## 2021-04-18 NOTE — Therapy (Signed)
[place], [place], [address] Phone: [phone]   Fax:  [phone]  Physical Therapy Treatment  Patient Details  Name: [name] MRN: [id number] Date of Birth: 07/12/1950 Referring Provider (PT): Dr. Paralee Cancel   Encounter Date: 04/18/2021   PT End of Session - 04/18/21 1401     Visit Number 7    Number of Visits 18    Date for PT Re-Evaluation 05/09/21    Authorization Type [employer] H[employer], auth required    PT Start Time 1320    PT Stop Time 1400    PT Time Calculation (min) 40 min    Activity Tolerance Patient tolerated treatment well    Behavior During Therapy [facility] for tasks assessed/performed             Past Medical History:  Diagnosis Date   Anxiety    Constipation    Essential hypertension    Headache    Hyperlipidemia    Insomnia    Obesity    Osteoarthritis    Superficial fungus infection of skin 11/25/2015   Type 2 diabetes mellitus ([place])    Urge incontinence 11/04/2014   Vertigo    Vitamin D deficiency     Past Surgical History:  Procedure Laterality Date   [place] Right 06/2016   right middle finger fused; wire in right thumb   COLONOSCOPY  10/20/02   normal rectum/left-sided diverticula   COLONOSCOPY  07/06/2011   Dr. Gala RomneyDionisio David anal canal hemorrhoids, diminutive rectal and transverse colon polyps. 2 adenoma on path. Left-sided diverticulosis.   COLONOSCOPY N/A 04/21/2016   Procedure: COLONOSCOPY;  Surgeon: Daneil Dolin, MD;  [facility];  Service: Endoscopy;  Laterality: N/A;  930    JOINT REPLACEMENT     PARTIAL HYSTERECTOMY  1987   TOE SURGERY     second toe on both feet, bunionectomy   TOTAL HIP ARTHROPLASTY  2001/2008   multiple surgeries on right hip including replacements. Two replacements and four dislocations.    TOTAL HIP ARTHROPLASTY  2010   left   TOTAL HIP REVISION  12/25/2011    Procedure: TOTAL HIP REVISION;  Surgeon: Mauri Pole, MD;  [facility];  Service: Orthopedics;  Laterality: Right;   TOTAL KNEE ARTHROPLASTY  2008   left   TOTAL KNEE ARTHROPLASTY  09/04/2011   Procedure: TOTAL KNEE ARTHROPLASTY;  Surgeon: Mauri Pole, MD;  [facility];  Service: Orthopedics;  Laterality: Right;   TOTAL KNEE REVISION Left 03/22/2021   Procedure: TOTAL KNEE REVISION;  Surgeon: Paralee Cancel, MD;  [facility];  Service: Orthopedics;  Laterality: Left;   TOTAL SHOULDER ARTHROPLASTY Left 10/08/2014   Procedure: LEFT TOTAL SHOULDER ARTHROPLASTY;  Surgeon: Justice Britain, MD;  Location: [place];  Service: Orthopedics;  Laterality: Left;   TOTAL SHOULDER REPLACEMENT Left 10/08/2014   dr supple    There were no vitals filed for this visit.   Subjective Assessment - 04/18/21 1339     Subjective PT states that her pain is about a 5/10 , pain is at mid thigh level .  Pt has not been doing HEP as she is fearful when she hears the clunking in her knee.    Limitations Sitting;Walking;Standing;House hold activities    How long can you sit comfortably? no problem.    Patient Stated Goals to walk with no assistive device    Currently  in Pain? Yes    Pain Score 5     Pain Location Leg   mid thigh   Pain Orientation Left    Pain Descriptors / Indicators Aching    Pain Onset More than a month ago    Pain Frequency Intermittent                               [facility] Adult PT Treatment/Exercise - 04/18/21 0001       Exercises   Exercises Knee/Hip      Knee/Hip Exercises: Supine   Short Arc Quad Sets Left;15 reps   repeat ER with 4#; repeat with ball between ankles squeezing   Short Arc Quad Sets Limitations 5    Straight Leg Raises Left;15 reps    Straight Leg Raises Limitations 4    Straight Leg Raise with External Rotation Left;15 reps    Straight Leg Raise with External Rotation Limitations 4      Knee/Hip Exercises: Sidelying   Hip ABduction  5 reps;Left    Hip ADduction Left;15 reps      Manual Therapy   Manual Therapy Edema management;Soft tissue mobilization    Manual therapy comments completed separate from all aspects of treatment    Edema Management to patella area    Soft tissue mobilization scar mobilization                       PT Short Term Goals - 04/08/21 1348       PT SHORT TERM GOAL #1   Title Patient will be independent with HEP in order to improve functional outcomes.    Time 3    Period Weeks    Status On-going    Target Date 04/18/21      PT SHORT TERM GOAL #2   Title Patient will report at least 25% improvement in symptoms for improved quality of life.    Time 3    Period Weeks    Status On-going    Target Date 04/18/21      PT SHORT TERM GOAL #3   Title Demo full left knee extension, and 110 degree left knee flexion to enable normalized gait mechanics and transfers    Baseline 3 lacking, 90 degree flexion    Time 3    Period Weeks    Status On-going    Target Date 04/18/21      PT SHORT TERM GOAL #4   Title Patient will be able to ambulate at least 250 feet in 2MWT in order to demonstrate improved gait speed for community ambulation.    Baseline 180 ft with cane    Time 3    Period Weeks    Status On-going    Target Date 04/18/21               PT Long Term Goals - 04/08/21 1348       PT LONG TERM GOAL #1   Title Patient will improve FOTO score by at least 5 points in order to indicate improved tolerance to activity.    Baseline 60% function    Time 6    Period Weeks    Status On-going      PT LONG TERM GOAL #2   Title Patient will be able to navigate stairs with reciprocal pattern without compensation in order to demonstrate improved LE strength.    Baseline step-to  Time 6    Period Weeks    Status On-going                   Plan - 04/18/21 1402     Clinical Impression Statement Therapist stayed with non weight bearing exercises ensuring  no clunking occuring during exericses.  Pt given new HEP.  Pt is extremely week in hip abductors.    Personal Factors and Comorbidities Age;Time since onset of injury/illness/exacerbation;Past/Current Experience    Examination-Activity Limitations Bend;Carry;Lift;Stand;Stairs;Squat;Locomotion Level;Transfers    Examination-Participation Restrictions Cleaning;Yard Work;Shop;Driving;Meal Prep    Stability/Clinical Decision Making Stable/Uncomplicated    Rehab Potential Good    PT Frequency 3x / week    PT Duration 6 weeks    PT Treatment/Interventions ADLs/Self Care Home Management;Gait training;Stair training;Functional mobility training;Therapeutic activities;Therapeutic exercise;Balance training;Patient/family education;Neuromuscular re-education;Dry needling;Manual techniques;Taping;Passive range of motion    PT Next Visit Plan Continued with TKR rehab    PT Home Exercise Plan QS, ball squeeze, hip abd, sit to stand with red loop around knees; 9/30:  Green tband  sitting for knee extension and flexion; 10/10:  SLR, SLRin ER with 4#; SAQ, SAQ in ER with 4#, sidelying hip abduction, addcution, prone hip extension and prone knee flexion with 4#    Consulted and Agree with Plan of Care Patient             Patient will benefit from skilled therapeutic intervention in order to improve the following deficits and impairments:  Abnormal gait, Decreased activity tolerance, Decreased mobility, Decreased strength, Decreased range of motion, Difficulty walking, Pain, Improper body mechanics  Visit Diagnosis: Difficulty in walking, not elsewhere classified  Acute pain of left knee  Muscle weakness (generalized)  Other symptoms and signs involving the musculoskeletal system     Problem List Patient Active Problem List   Diagnosis Date Noted   S/P revision of total knee, left 03/22/2021   Screening for colorectal cancer 11/29/2017   Well woman exam with routine gynecological exam 11/29/2017    Change in stool caliber 03/31/2016   Hx of adenomatous colonic polyps 03/31/2016   Superficial fungus infection of skin 11/25/2015   Urge incontinence 11/04/2014   S/P shoulder replacement 10/08/2014   Constipation - functional 10/31/2013   S/P right TH revision 12/25/2011   S/P right TKA 09/04/2011   Constipation 06/22/2011   Rectal bleeding 06/22/2011   Family history of colonic polyps 06/22/2011    Rayetta Humphrey, PT CLT [phone]  04/18/2021, 2:27 PM  [place], [place], [address] Phone: [phone]   Fax:  [phone]  Name: [name] MRN: [id number] Date of Birth: 1951-06-30

## 2021-04-20 ENCOUNTER — Encounter (HOSPITAL_COMMUNITY): Payer: Medicare Other

## 2021-04-22 ENCOUNTER — Ambulatory Visit (HOSPITAL_COMMUNITY): Payer: Medicare Other | Admitting: Physical Therapy

## 2021-04-22 ENCOUNTER — Other Ambulatory Visit: Payer: Self-pay

## 2021-04-22 DIAGNOSIS — R29898 Other symptoms and signs involving the musculoskeletal system: Secondary | ICD-10-CM

## 2021-04-22 DIAGNOSIS — M6281 Muscle weakness (generalized): Secondary | ICD-10-CM | POA: Diagnosis not present

## 2021-04-22 DIAGNOSIS — M25562 Pain in left knee: Secondary | ICD-10-CM | POA: Diagnosis not present

## 2021-04-22 DIAGNOSIS — R262 Difficulty in walking, not elsewhere classified: Secondary | ICD-10-CM | POA: Diagnosis not present

## 2021-04-22 NOTE — Therapy (Signed)
[place], [place], [address] Phone: [phone]   Fax:  [phone]  Physical Therapy Treatment  Patient Details  Name: [name] MRN: [id number] Date of Birth: 1950/09/07 Referring Provider (PT): Dr. Paralee Cancel   Encounter Date: 04/22/2021   PT End of Session - 04/22/21 1355     Visit Number 8    Number of Visits 18    Date for PT Re-Evaluation 05/09/21    Authorization Type [employer] H[employer], auth required    PT Start Time 1320    PT Stop Time 1400    PT Time Calculation (min) 40 min    Activity Tolerance Patient tolerated treatment well    Behavior During Therapy [facility] for tasks assessed/performed             Past Medical History:  Diagnosis Date   Anxiety    Constipation    Essential hypertension    Headache    Hyperlipidemia    Insomnia    Obesity    Osteoarthritis    Superficial fungus infection of skin 11/25/2015   Type 2 diabetes mellitus ([place])    Urge incontinence 11/04/2014   Vertigo    Vitamin D deficiency     Past Surgical History:  Procedure Laterality Date   [place] Right 06/2016   right middle finger fused; wire in right thumb   COLONOSCOPY  10/20/02   normal rectum/left-sided diverticula   COLONOSCOPY  07/06/2011   Dr. Gala RomneyDionisio David anal canal hemorrhoids, diminutive rectal and transverse colon polyps. 2 adenoma on path. Left-sided diverticulosis.   COLONOSCOPY N/A 04/21/2016   Procedure: COLONOSCOPY;  Surgeon: Daneil Dolin, MD;  [facility];  Service: Endoscopy;  Laterality: N/A;  930    JOINT REPLACEMENT     PARTIAL HYSTERECTOMY  1987   TOE SURGERY     second toe on both feet, bunionectomy   TOTAL HIP ARTHROPLASTY  2001/2008   multiple surgeries on right hip including replacements. Two replacements and four dislocations.    TOTAL HIP ARTHROPLASTY  2010   left   TOTAL HIP REVISION  12/25/2011    Procedure: TOTAL HIP REVISION;  Surgeon: Mauri Pole, MD;  Location: [facility];  Service: Orthopedics;  Laterality: Right;   TOTAL KNEE ARTHROPLASTY  2008   left   TOTAL KNEE ARTHROPLASTY  09/04/2011   Procedure: TOTAL KNEE ARTHROPLASTY;  Surgeon: Mauri Pole, MD;  [facility];  Service: Orthopedics;  Laterality: Right;   TOTAL KNEE REVISION Left 03/22/2021   Procedure: TOTAL KNEE REVISION;  Surgeon: Paralee Cancel, MD;  [facility];  Service: Orthopedics;  Laterality: Left;   TOTAL SHOULDER ARTHROPLASTY Left 10/08/2014   Procedure: LEFT TOTAL SHOULDER ARTHROPLASTY;  Surgeon: Justice Britain, MD;  Location: [place];  Service: Orthopedics;  Laterality: Left;   TOTAL SHOULDER REPLACEMENT Left 10/08/2014   dr supple    There were no vitals filed for this visit.   Subjective Assessment - 04/22/21 1317     Subjective PT states that she was able to complete her exercises except for lying on her side and raising her leg; she was unable to lift her leg at all.    Limitations Sitting;Walking;Standing;House hold activities    How long can you sit comfortably? no problem.    Patient Stated Goals to walk with no assistive device    Currently in Pain? No/denies    Pain  Score 0-No pain    Pain Location --    Pain Orientation --    Pain Descriptors / Indicators --    Aggravating Factors  exercises    Pain Relieving Factors rest                               [facility] Adult PT Treatment/Exercise - 04/22/21 0001       Exercises   Exercises Knee/Hip      Knee/Hip Exercises: Standing   Heel Raises 15 reps    Knee Flexion --    Knee Flexion Limitations --    Terminal Knee Extension --      Knee/Hip Exercises: Supine   Short Arc Quad Sets Left;15 reps   repeat ER with 4#; repeat with ball between ankles squeezing   Short Arc Quad Sets Limitations 5    Straight Leg Raises Left;15 reps    Straight Leg Raise with External Rotation Left;10 reps      Knee/Hip Exercises:  Sidelying   Other Sidelying Knee/Hip Exercises RT sidelying abduction isometric to activate Lt abduction; Lt isometric abduction                       PT Short Term Goals - 04/08/21 1348       PT SHORT TERM GOAL #1   Title Patient will be independent with HEP in order to improve functional outcomes.    Time 3    Period Weeks    Status On-going    Target Date 04/18/21      PT SHORT TERM GOAL #2   Title Patient will report at least 25% improvement in symptoms for improved quality of life.    Time 3    Period Weeks    Status On-going    Target Date 04/18/21      PT SHORT TERM GOAL #3   Title Demo full left knee extension, and 110 degree left knee flexion to enable normalized gait mechanics and transfers    Baseline 3 lacking, 90 degree flexion    Time 3    Period Weeks    Status On-going    Target Date 04/18/21      PT SHORT TERM GOAL #4   Title Patient will be able to ambulate at least 250 feet in 2MWT in order to demonstrate improved gait speed for community ambulation.    Baseline 180 ft with cane    Time 3    Period Weeks    Status On-going    Target Date 04/18/21               PT Long Term Goals - 04/08/21 1348       PT LONG TERM GOAL #1   Title Patient will improve FOTO score by at least 5 points in order to indicate improved tolerance to activity.    Baseline 60% function    Time 6    Period Weeks    Status On-going      PT LONG TERM GOAL #2   Title Patient will be able to navigate stairs with reciprocal pattern without compensation in order to demonstrate improved LE strength.    Baseline step-to    Time 6    Period Weeks    Status On-going                   Plan - 04/22/21 1355  Clinical Impression Statement PT LT hip abductor are very weak, 2/5 worked on obtaining exercises to improve strength but will not reproduce clonking.  Added standing activity with the above criterea.    Personal Factors and Comorbidities  Age;Time since onset of injury/illness/exacerbation;Past/Current Experience    Examination-Activity Limitations Bend;Carry;Lift;Stand;Stairs;Squat;Locomotion Level;Transfers    Examination-Participation Restrictions Cleaning;Yard Work;Shop;Driving;Meal Prep    Stability/Clinical Decision Making Stable/Uncomplicated    Rehab Potential Good    PT Frequency 3x / week    PT Duration 6 weeks    PT Treatment/Interventions ADLs/Self Care Home Management;Gait training;Stair training;Functional mobility training;Therapeutic activities;Therapeutic exercise;Balance training;Patient/family education;Neuromuscular re-education;Dry needling;Manual techniques;Taping;Passive range of motion    PT Next Visit Plan Continued with TKR rehab    PT Home Exercise Plan QS, ball squeeze, hip abd, sit to stand with red loop around knees; 9/30:  Green tband  sitting for knee extension and flexion; 10/10:  SLR, SLRin ER with 4#; SAQ, SAQ in ER with 4#, sidelying hip abduction, addcution, prone hip extension and prone knee flexion with 4#    Consulted and Agree with Plan of Care Patient             Patient will benefit from skilled therapeutic intervention in order to improve the following deficits and impairments:  Abnormal gait, Decreased activity tolerance, Decreased mobility, Decreased strength, Decreased range of motion, Difficulty walking, Pain, Improper body mechanics  Visit Diagnosis: Acute pain of left knee  Difficulty in walking, not elsewhere classified  Muscle weakness (generalized)  Other symptoms and signs involving the musculoskeletal system     Problem List Patient Active Problem List   Diagnosis Date Noted   S/P revision of total knee, left 03/22/2021   Screening for colorectal cancer 11/29/2017   Well woman exam with routine gynecological exam 11/29/2017   Change in stool caliber 03/31/2016   Hx of adenomatous colonic polyps 03/31/2016   Superficial fungus infection of skin 11/25/2015    Urge incontinence 11/04/2014   S/P shoulder replacement 10/08/2014   Constipation - functional 10/31/2013   S/P right TH revision 12/25/2011   S/P right TKA 09/04/2011   Constipation 06/22/2011   Rectal bleeding 06/22/2011   Family history of colonic polyps 06/22/2011    Rayetta Humphrey, PT CLT [phone]  04/22/2021, 2:03 PM  [place], [place], [address] Phone: [phone]   Fax:  [phone]  Name: [name] MRN: [id number] Date of Birth: 07-May-1951

## 2021-04-25 ENCOUNTER — Ambulatory Visit (HOSPITAL_COMMUNITY): Payer: Medicare Other

## 2021-04-25 ENCOUNTER — Other Ambulatory Visit: Payer: Self-pay

## 2021-04-25 DIAGNOSIS — M25562 Pain in left knee: Secondary | ICD-10-CM

## 2021-04-25 DIAGNOSIS — R262 Difficulty in walking, not elsewhere classified: Secondary | ICD-10-CM | POA: Diagnosis not present

## 2021-04-25 DIAGNOSIS — M6281 Muscle weakness (generalized): Secondary | ICD-10-CM | POA: Diagnosis not present

## 2021-04-25 DIAGNOSIS — Z23 Encounter for immunization: Secondary | ICD-10-CM | POA: Diagnosis not present

## 2021-04-25 DIAGNOSIS — R29898 Other symptoms and signs involving the musculoskeletal system: Secondary | ICD-10-CM

## 2021-04-25 NOTE — Therapy (Signed)
[place], [place], [address] Phone: [phone]   Fax:  [phone]  Physical Therapy Treatment  Patient Details  Name: [name] MRN: [id number] Date of Birth: 03/22/1951 Referring Provider (PT): Dr. Paralee Cancel   Encounter Date: 04/25/2021   PT End of Session - 04/25/21 1259     Visit Number 9    Number of Visits 18    Date for PT Re-Evaluation 05/09/21    Authorization Type [employer] H[employer], auth required    PT Start Time 39    PT Stop Time 6834    PT Time Calculation (min) 45 min    Activity Tolerance Patient tolerated treatment well    Behavior During Therapy [facility] for tasks assessed/performed             Past Medical History:  Diagnosis Date   Anxiety    Constipation    Essential hypertension    Headache    Hyperlipidemia    Insomnia    Obesity    Osteoarthritis    Superficial fungus infection of skin 11/25/2015   Type 2 diabetes mellitus ([place])    Urge incontinence 11/04/2014   Vertigo    Vitamin D deficiency     Past Surgical History:  Procedure Laterality Date   [place] Right 06/2016   right middle finger fused; wire in right thumb   COLONOSCOPY  10/20/02   normal rectum/left-sided diverticula   COLONOSCOPY  07/06/2011   Dr. Gala RomneyDionisio David anal canal hemorrhoids, diminutive rectal and transverse colon polyps. 2 adenoma on path. Left-sided diverticulosis.   COLONOSCOPY N/A 04/21/2016   Procedure: COLONOSCOPY;  Surgeon: Daneil Dolin, MD;  [facility];  Service: Endoscopy;  Laterality: N/A;  930    JOINT REPLACEMENT     PARTIAL HYSTERECTOMY  1987   TOE SURGERY     second toe on both feet, bunionectomy   TOTAL HIP ARTHROPLASTY  2001/2008   multiple surgeries on right hip including replacements. Two replacements and four dislocations.    TOTAL HIP ARTHROPLASTY  2010   left   TOTAL HIP REVISION  12/25/2011    Procedure: TOTAL HIP REVISION;  Surgeon: Mauri Pole, MD;  [facility];  Service: Orthopedics;  Laterality: Right;   TOTAL KNEE ARTHROPLASTY  2008   left   TOTAL KNEE ARTHROPLASTY  09/04/2011   Procedure: TOTAL KNEE ARTHROPLASTY;  Surgeon: Mauri Pole, MD;  [facility];  Service: Orthopedics;  Laterality: Right;   TOTAL KNEE REVISION Left 03/22/2021   Procedure: TOTAL KNEE REVISION;  Surgeon: Paralee Cancel, MD;  [facility];  Service: Orthopedics;  Laterality: Left;   TOTAL SHOULDER ARTHROPLASTY Left 10/08/2014   Procedure: LEFT TOTAL SHOULDER ARTHROPLASTY;  Surgeon: Justice Britain, MD;  Location: [place];  Service: Orthopedics;  Laterality: Left;   TOTAL SHOULDER REPLACEMENT Left 10/08/2014   dr supple    There were no vitals filed for this visit.   Subjective Assessment - 04/25/21 1304     Subjective Pt notes continued clicking, grinding, popping in her knee with activity but notes main issue with pain occurs at night only.    Limitations Sitting;Walking;Standing;House hold activities    How long can you sit comfortably? no problem.    Patient Stated Goals to walk with no assistive device    Currently in Pain? No/denies    Pain Score 0-No pain    Pain Location  Leg    Pain Orientation Left    Pain Type Surgical pain                [facility] PT Assessment - 04/25/21 0001       Assessment   Medical Diagnosis Left TKR    Referring Provider (PT) Dr. Paralee Cancel                           [facility] Adult PT Treatment/Exercise - 04/25/21 0001       Knee/Hip Exercises: Standing   Other Standing Knee Exercises sidestepping x 2 min    Other Standing Knee Exercises RLE lift-off from 4" step to improve LLE loading response 2x10      Knee/Hip Exercises: Supine   Quad Sets Strengthening;Left;3 sets;10 reps    Bridges Strengthening;Both;2 sets;10 reps    Bridges Limitations 10 reps with PF, DF    Straight Leg Raises Strengthening;Left;3 sets;10 reps     Straight Leg Raise with External Rotation Left;10 reps      Knee/Hip Exercises: Sidelying   Clams LLE 2x10 manual resistance    Other Sidelying Knee/Hip Exercises Left hip abductor eccentrics 3x5 reps                       PT Short Term Goals - 04/08/21 1348       PT SHORT TERM GOAL #1   Title Patient will be independent with HEP in order to improve functional outcomes.    Time 3    Period Weeks    Status On-going    Target Date 04/18/21      PT SHORT TERM GOAL #2   Title Patient will report at least 25% improvement in symptoms for improved quality of life.    Time 3    Period Weeks    Status On-going    Target Date 04/18/21      PT SHORT TERM GOAL #3   Title Demo full left knee extension, and 110 degree left knee flexion to enable normalized gait mechanics and transfers    Baseline 3 lacking, 90 degree flexion    Time 3    Period Weeks    Status On-going    Target Date 04/18/21      PT SHORT TERM GOAL #4   Title Patient will be able to ambulate at least 250 feet in 2MWT in order to demonstrate improved gait speed for community ambulation.    Baseline 180 ft with cane    Time 3    Period Weeks    Status On-going    Target Date 04/18/21               PT Long Term Goals - 04/08/21 1348       PT LONG TERM GOAL #1   Title Patient will improve FOTO score by at least 5 points in order to indicate improved tolerance to activity.    Baseline 60% function    Time 6    Period Weeks    Status On-going      PT LONG TERM GOAL #2   Title Patient will be able to navigate stairs with reciprocal pattern without compensation in order to demonstrate improved LE strength.    Baseline step-to    Time 6    Period Weeks    Status On-going  Plan - 04/25/21 1407     Clinical Impression Statement Continues to be limited in single limb support due to left hip abductor weakness.  Progressing with focused strnthening activities to rectify  this. Continued sessions indicated to improve LLE strength and stability to enable ambulation without AD and low risk fo rfalls    Personal Factors and Comorbidities Age;Time since onset of injury/illness/exacerbation;Past/Current Experience    Examination-Activity Limitations Bend;Carry;Lift;Stand;Stairs;Squat;Locomotion Level;Transfers    Examination-Participation Restrictions Cleaning;Yard Work;Shop;Driving;Meal Prep    Stability/Clinical Decision Making Stable/Uncomplicated    Rehab Potential Good    PT Frequency 3x / week    PT Duration 6 weeks    PT Treatment/Interventions ADLs/Self Care Home Management;Gait training;Stair training;Functional mobility training;Therapeutic activities;Therapeutic exercise;Balance training;Patient/family education;Neuromuscular re-education;Dry needling;Manual techniques;Taping;Passive range of motion    PT Next Visit Plan Continued with TKR rehab    PT Home Exercise Plan QS, ball squeeze, hip abd, sit to stand with red loop around knees; 9/30:  Green tband  sitting for knee extension and flexion; 10/10:  SLR, SLRin ER with 4#; SAQ, SAQ in ER with 4#, sidelying hip abduction, addcution, prone hip extension and prone knee flexion with 4#    Consulted and Agree with Plan of Care Patient             Patient will benefit from skilled therapeutic intervention in order to improve the following deficits and impairments:  Abnormal gait, Decreased activity tolerance, Decreased mobility, Decreased strength, Decreased range of motion, Difficulty walking, Pain, Improper body mechanics  Visit Diagnosis: Acute pain of left knee  Difficulty in walking, not elsewhere classified  Muscle weakness (generalized)  Other symptoms and signs involving the musculoskeletal system     Problem List Patient Active Problem List   Diagnosis Date Noted   S/P revision of total knee, left 03/22/2021   Screening for colorectal cancer 11/29/2017   Well woman exam with routine  gynecological exam 11/29/2017   Change in stool caliber 03/31/2016   Hx of adenomatous colonic polyps 03/31/2016   Superficial fungus infection of skin 11/25/2015   Urge incontinence 11/04/2014   S/P shoulder replacement 10/08/2014   Constipation - functional 10/31/2013   S/P right TH revision 12/25/2011   S/P right TKA 09/04/2011   Constipation 06/22/2011   Rectal bleeding 06/22/2011   Family history of colonic polyps 06/22/2011    Toniann Fail, PT 04/25/2021, 2:08 PM  [place], [place], [address] Phone: [phone]   Fax:  [phone]  Name: [name] MRN: [id number] Date of Birth: 05/10/1951

## 2021-04-27 ENCOUNTER — Encounter (HOSPITAL_COMMUNITY): Payer: Medicare Other | Admitting: Physical Therapy

## 2021-04-29 ENCOUNTER — Encounter (HOSPITAL_COMMUNITY): Payer: Self-pay

## 2021-04-29 ENCOUNTER — Other Ambulatory Visit: Payer: Self-pay

## 2021-04-29 ENCOUNTER — Ambulatory Visit (HOSPITAL_COMMUNITY): Payer: Medicare Other

## 2021-04-29 DIAGNOSIS — M25562 Pain in left knee: Secondary | ICD-10-CM

## 2021-04-29 DIAGNOSIS — M6281 Muscle weakness (generalized): Secondary | ICD-10-CM | POA: Diagnosis not present

## 2021-04-29 DIAGNOSIS — R29898 Other symptoms and signs involving the musculoskeletal system: Secondary | ICD-10-CM | POA: Diagnosis not present

## 2021-04-29 DIAGNOSIS — R262 Difficulty in walking, not elsewhere classified: Secondary | ICD-10-CM

## 2021-04-29 NOTE — Therapy (Signed)
[facility] [address] [place], [place], [address] Phone: [phone]   Fax:  [phone]  Physical Therapy Treatment and Progress Note  Patient Details  Name: [name] MRN: [id number] Date of Birth: 06-May-1951 Referring Provider (PT): Dr. Paralee Cancel  Progress Note Reporting Period 03/28/21 to 04/29/21  See note below for Objective Data and Assessment of Progress/Goals.     Encounter Date: 04/29/2021   PT End of Session - 04/29/21 1259     Visit Number 10    Number of Visits 18    Date for PT Re-Evaluation 05/09/21    Authorization Type [employer] H[employer], auth required    Progress Note Due on Visit 31    PT Start Time 1300    PT Stop Time 1345    PT Time Calculation (min) 45 min    Activity Tolerance Patient tolerated treatment well    Behavior During Therapy [facility] for tasks assessed/performed             Past Medical History:  Diagnosis Date   Anxiety    Constipation    Essential hypertension    Headache    Hyperlipidemia    Insomnia    Obesity    Osteoarthritis    Superficial fungus infection of skin 11/25/2015   Type 2 diabetes mellitus ([place])    Urge incontinence 11/04/2014   Vertigo    Vitamin D deficiency     Past Surgical History:  Procedure Laterality Date   ABDOMINAL HYSTERECTOMY  1989   CARPAL TUNNEL RELEASE Right 06/2016   right middle finger fused; wire in right thumb   COLONOSCOPY  10/20/02   normal rectum/left-sided diverticula   COLONOSCOPY  07/06/2011   Dr. Gala RomneyDionisio David anal canal hemorrhoids, diminutive rectal and transverse colon polyps. 2 adenoma on path. Left-sided diverticulosis.   COLONOSCOPY N/A 04/21/2016   Procedure: COLONOSCOPY;  Surgeon: Daneil Dolin, MD;  [facility];  Service: Endoscopy;  Laterality: N/A;  930    JOINT REPLACEMENT     PARTIAL HYSTERECTOMY  1987   TOE SURGERY     second toe on both feet, bunionectomy   TOTAL HIP ARTHROPLASTY  2001/2008    multiple surgeries on right hip including replacements. Two replacements and four dislocations.    TOTAL HIP ARTHROPLASTY  2010   left   TOTAL HIP REVISION  12/25/2011   Procedure: TOTAL HIP REVISION;  Surgeon: Mauri Pole, MD;  [facility];  Service: Orthopedics;  Laterality: Right;   TOTAL KNEE ARTHROPLASTY  2008   left   TOTAL KNEE ARTHROPLASTY  09/04/2011   Procedure: TOTAL KNEE ARTHROPLASTY;  Surgeon: Mauri Pole, MD;  [facility];  Service: Orthopedics;  Laterality: Right;   TOTAL KNEE REVISION Left 03/22/2021   Procedure: TOTAL KNEE REVISION;  Surgeon: Paralee Cancel, MD;  [facility];  Service: Orthopedics;  Laterality: Left;   TOTAL SHOULDER ARTHROPLASTY Left 10/08/2014   Procedure: LEFT TOTAL SHOULDER ARTHROPLASTY;  Surgeon: Justice Britain, MD;  Location: [facility];  Service: Orthopedics;  Laterality: Left;   TOTAL SHOULDER REPLACEMENT Left 10/08/2014   dr supple    There were no vitals filed for this visit.   Subjective Assessment - 04/29/21 1302     Subjective Not much change in symptoms    Limitations Sitting;Walking;Standing;House hold activities    How long can you sit comfortably? no problem.    Patient Stated Goals to walk with no assistive device  [facility] PT Assessment - 04/29/21 0001       Assessment   Medical Diagnosis Left TKR    Referring Provider (PT) Dr. Paralee Cancel    Next MD Visit 05/04/21      AROM   AROM Assessment Site Hip    Right/Left Hip Left    Left Knee Extension 0    Left Knee Flexion 127      Strength   Strength Assessment Site Hip    Right/Left Hip Left    Left Hip Flexion 3+/5    Left Hip Extension 3-/5    Left Hip External Rotation 3+/5    Left Hip ABduction 3-/5    Left Knee Flexion 5/5    Left Knee Extension 4/5      Transfers   Transfers Sit to Stand    Sit to Stand 6: Modified independent (Device/Increase time)      Ambulation/Gait   Ambulation/Gait Yes    Ambulation/Gait Assistance 6:  Modified independent (Device/Increase time)    Ambulation Distance (Feet) 390 Feet    Assistive device Rolling walker    Gait Pattern Step-through pattern    Ambulation Surface Level;Indoor                           [facility] Adult PT Treatment/Exercise - 04/29/21 0001       Ambulation/Gait   Gait Comments 2MWT. Gait trials without AD and demo left lateral lean in stance phase. Gait with NBQC and normalized pattern      Knee/Hip Exercises: Standing   Heel Raises Both;20 reps    Other Standing Knee Exercises sidestepping x 2 min      Knee/Hip Exercises: Seated   Long Arc Quad Strengthening;Both;3 sets;10 reps    Long Arc Quad Weight 3 lbs.      Knee/Hip Exercises: Supine   Straight Leg Raises Strengthening;Left;3 sets;10 reps    Straight Leg Raise with External Rotation Left;10 reps      Knee/Hip Exercises: Sidelying   Hip ABduction 5 reps;Left    Clams LLE 2x10 manual resistance    Other Sidelying Knee/Hip Exercises Left hip abductor eccentrics 3x5 reps                       PT Short Term Goals - 04/29/21 1312       PT SHORT TERM GOAL #1   Title Patient will be independent with HEP in order to improve functional outcomes.    Time 3    Period Weeks    Status On-going    Target Date 04/18/21      PT SHORT TERM GOAL #2   Title Patient will report at least 25% improvement in symptoms for improved quality of life.    Baseline 5% improvement, continued issues with LLE    Time 3    Period Weeks    Status On-going    Target Date 04/18/21      PT SHORT TERM GOAL #3   Title Demo full left knee extension, and 110 degree left knee flexion to enable normalized gait mechanics and transfers    Baseline full extension, 127 flexion left knee    Time 3    Period Weeks    Status Achieved    Target Date 04/18/21      PT SHORT TERM GOAL #4   Title Patient will be able to ambulate at least 250 feet in 2MWT in order to demonstrate  improved gait speed for  community ambulation.    Baseline 390 ft with RW    Time 3    Period Weeks    Status Achieved    Target Date 04/18/21               PT Long Term Goals - 04/08/21 1348       PT LONG TERM GOAL #1   Title Patient will improve FOTO score by at least 5 points in order to indicate improved tolerance to activity.    Baseline 60% function    Time 6    Period Weeks    Status On-going      PT LONG TERM GOAL #2   Title Patient will be able to navigate stairs with reciprocal pattern without compensation in order to demonstrate improved LE strength.    Baseline step-to    Time 6    Period Weeks    Status On-going                   Plan - 04/29/21 1350     Clinical Impression Statement Progressing with POC details and demonstrates improved ambulation velocity and LLE strength and is now able to normalize pattern with cane, albeit still experiencing left knee crepitus and clunking with flexion to terminal left knee extension.  Continues to demonstrate profound left hip abductor weakness and left foot pes planues/post tib. deficient which is likely contributing to deficits as well.  Continued sessions indicated to improve LLE strength and stability to enable ambulation without AD and reduce risk for falls    Personal Factors and Comorbidities Age;Time since onset of injury/illness/exacerbation;Past/Current Experience    Examination-Activity Limitations Bend;Carry;Lift;Stand;Stairs;Squat;Locomotion Level;Transfers    Examination-Participation Restrictions Cleaning;Yard Work;Shop;Driving;Meal Prep    Stability/Clinical Decision Making Stable/Uncomplicated    Rehab Potential Good    PT Frequency 3x / week    PT Duration 6 weeks    PT Treatment/Interventions ADLs/Self Care Home Management;Gait training;Stair training;Functional mobility training;Therapeutic activities;Therapeutic exercise;Balance training;Patient/family education;Neuromuscular re-education;Dry needling;Manual  techniques;Taping;Passive range of motion    PT Next Visit Plan Continued with TKR rehab    PT Home Exercise Plan QS, ball squeeze, hip abd, sit to stand with red loop around knees; 9/30:  Green tband  sitting for knee extension and flexion; 10/10:  SLR, SLRin ER with 4#; SAQ, SAQ in ER with 4#, sidelying hip abduction, addcution, prone hip extension and prone knee flexion with 4#    Consulted and Agree with Plan of Care Patient             Patient will benefit from skilled therapeutic intervention in order to improve the following deficits and impairments:  Abnormal gait, Decreased activity tolerance, Decreased mobility, Decreased strength, Decreased range of motion, Difficulty walking, Pain, Improper body mechanics  Visit Diagnosis: Acute pain of left knee  Difficulty in walking, not elsewhere classified  Muscle weakness (generalized)  Other symptoms and signs involving the musculoskeletal system     Problem List Patient Active Problem List   Diagnosis Date Noted   S/P revision of total knee, left 03/22/2021   Screening for colorectal cancer 11/29/2017   Well woman exam with routine gynecological exam 11/29/2017   Change in stool caliber 03/31/2016   Hx of adenomatous colonic polyps 03/31/2016   Superficial fungus infection of skin 11/25/2015   Urge incontinence 11/04/2014   S/P shoulder replacement 10/08/2014   Constipation - functional 10/31/2013   S/P right TH revision 12/25/2011   S/P right TKA 09/04/2011  Constipation 06/22/2011   Rectal bleeding 06/22/2011   Family history of colonic polyps 06/22/2011    Toniann Fail, PT 04/29/2021, 1:54 PM  [place], [place], [address] Phone: [phone]   Fax:  [phone]  Name: [name] MRN: [id number] Date of Birth: [birth year]

## 2021-05-02 ENCOUNTER — Other Ambulatory Visit: Payer: Self-pay

## 2021-05-02 ENCOUNTER — Encounter: Payer: Self-pay | Admitting: *Deleted

## 2021-05-02 ENCOUNTER — Ambulatory Visit (HOSPITAL_COMMUNITY): Payer: Medicare Other | Admitting: Physical Therapy

## 2021-05-02 DIAGNOSIS — R262 Difficulty in walking, not elsewhere classified: Secondary | ICD-10-CM

## 2021-05-02 DIAGNOSIS — M6281 Muscle weakness (generalized): Secondary | ICD-10-CM

## 2021-05-02 DIAGNOSIS — R29898 Other symptoms and signs involving the musculoskeletal system: Secondary | ICD-10-CM | POA: Diagnosis not present

## 2021-05-02 DIAGNOSIS — M25562 Pain in left knee: Secondary | ICD-10-CM

## 2021-05-02 NOTE — Therapy (Signed)
[place], [place], [address] Phone: [phone]   Fax:  [phone]  Physical Therapy Treatment  Patient Details  Name: [name] MRN: [id number] Date of Birth: August 01, 1950 Referring Provider (PT): Dr. Paralee Cancel   Encounter Date: 05/02/2021   PT End of Session - 05/02/21 1358     Visit Number 11    Number of Visits 18    Date for PT Re-Evaluation 05/09/21    Authorization Type [employer] HMO, auth required    Progress Note Due on Visit 20    PT Start Time 1520    PT Stop Time 1558    PT Time Calculation (min) 38 min    Activity Tolerance Patient tolerated treatment well    Behavior During Therapy [facility] for tasks assessed/performed             Past Medical History:  Diagnosis Date   Anxiety    Constipation    Essential hypertension    Headache    Hyperlipidemia    Insomnia    Obesity    Osteoarthritis    Superficial fungus infection of skin 11/25/2015   Type 2 diabetes mellitus ([place])    Urge incontinence 11/04/2014   Vertigo    Vitamin D deficiency     Past Surgical History:  Procedure Laterality Date   [place] Right 06/2016   right middle finger fused; wire in right thumb   COLONOSCOPY  10/20/02   normal rectum/left-sided diverticula   COLONOSCOPY  07/06/2011   Dr. Gala RomneyDionisio David anal canal hemorrhoids, diminutive rectal and transverse colon polyps. 2 adenoma on path. Left-sided diverticulosis.   COLONOSCOPY N/A 04/21/2016   Procedure: COLONOSCOPY;  Surgeon: Daneil Dolin, MD;  [facility];  Service: Endoscopy;  Laterality: N/A;  930    JOINT REPLACEMENT     PARTIAL HYSTERECTOMY  1987   TOE SURGERY     second toe on both feet, bunionectomy   TOTAL HIP ARTHROPLASTY  2001/2008   multiple surgeries on right hip including replacements. Two replacements and four dislocations.    TOTAL HIP ARTHROPLASTY  2010   left   TOTAL  HIP REVISION  12/25/2011   Procedure: TOTAL HIP REVISION;  Surgeon: Mauri Pole, MD;  [facility];  Service: Orthopedics;  Laterality: Right;   TOTAL KNEE ARTHROPLASTY  2008   left   TOTAL KNEE ARTHROPLASTY  09/04/2011   Procedure: TOTAL KNEE ARTHROPLASTY;  Surgeon: Mauri Pole, MD;  [facility];  Service: Orthopedics;  Laterality: Right;   TOTAL KNEE REVISION Left 03/22/2021   Procedure: TOTAL KNEE REVISION;  Surgeon: Paralee Cancel, MD;  [facility];  Service: Orthopedics;  Laterality: Left;   TOTAL SHOULDER ARTHROPLASTY Left 10/08/2014   Procedure: LEFT TOTAL SHOULDER ARTHROPLASTY;  Surgeon: Justice Britain, MD;  Location: [facility];  Service: Orthopedics;  Laterality: Left;   TOTAL SHOULDER REPLACEMENT Left 10/08/2014   dr supple    There were no vitals filed for this visit.   Subjective Assessment - 05/02/21 1316     Subjective PT continues to complete the exercises that donot make her knee clink.    Limitations Sitting;Walking;Standing;House hold activities    How long can you sit comfortably? no problem.    Patient Stated Goals to walk with no assistive device    Currently in Pain? No/denies  [facility] Adult PT Treatment/Exercise - 05/02/21 0001       Exercises   Exercises Knee/Hip      Knee/Hip Exercises: Standing   Heel Raises Both;20 reps    Forward Lunges Left;5 reps    Forward Lunges Limitations mini lunges no cloncking    Side Lunges 10 reps    Side Lunges Limitations mini no cloncking    Terminal Knee Extension Both;15 reps    Functional Squat 10 reps    Functional Squat Limitations mini    Other Standing Knee Exercises sidestepping x 2 at mat    Other Standing Knee Exercises hip adduction isometric with ball between leg x15 ; hip abduction isometric with foot against wall      Knee/Hip Exercises: Supine   Straight Leg Raises Left;15 reps      Knee/Hip Exercises: Sidelying   Hip ABduction  Strengthening;2 sets;10 reps    Other Sidelying Knee/Hip Exercises lT SIDELYING ISOMETRIC X 20      Knee/Hip Exercises: Prone   Hamstring Curl 15 reps    Hamstring Curl Limitations 5                       PT Short Term Goals - 04/29/21 1312       PT SHORT TERM GOAL #1   Title Patient will be independent with HEP in order to improve functional outcomes.    Time 3    Period Weeks    Status On-going    Target Date 04/18/21      PT SHORT TERM GOAL #2   Title Patient will report at least 25% improvement in symptoms for improved quality of life.    Baseline 5% improvement, continued issues with LLE    Time 3    Period Weeks    Status On-going    Target Date 04/18/21      PT SHORT TERM GOAL #3   Title Demo full left knee extension, and 110 degree left knee flexion to enable normalized gait mechanics and transfers    Baseline full extension, 127 flexion left knee    Time 3    Period Weeks    Status Achieved    Target Date 04/18/21      PT SHORT TERM GOAL #4   Title Patient will be able to ambulate at least 250 feet in 2MWT in order to demonstrate improved gait speed for community ambulation.    Baseline 390 ft with RW    Time 3    Period Weeks    Status Achieved    Target Date 04/18/21               PT Long Term Goals - 04/08/21 1348       PT LONG TERM GOAL #1   Title Patient will improve FOTO score by at least 5 points in order to indicate improved tolerance to activity.    Baseline 60% function    Time 6    Period Weeks    Status On-going      PT LONG TERM GOAL #2   Title Patient will be able to navigate stairs with reciprocal pattern without compensation in order to demonstrate improved LE strength.    Baseline step-to    Time 6    Period Weeks    Status On-going                   Plan - 05/02/21 1400     Clinical Impression  Statement Therapist notes that pt heels come off the floor with attempted heelraise which was not occuring  2 weeks ago, pt is also able to lift LT leg up approximately 4" in Rt sidelying position which she was unable to do.  Therapist continued to stay with strengthening exercises that did not reproduce the clonking.    Personal Factors and Comorbidities Age;Time since onset of injury/illness/exacerbation;Past/Current Experience    Examination-Activity Limitations Bend;Carry;Lift;Stand;Stairs;Squat;Locomotion Level;Transfers    Examination-Participation Restrictions Cleaning;Yard Work;Shop;Driving;Meal Prep    Stability/Clinical Decision Making Stable/Uncomplicated    Rehab Potential Good    PT Frequency 3x / week    PT Duration 6 weeks    PT Treatment/Interventions ADLs/Self Care Home Management;Gait training;Stair training;Functional mobility training;Therapeutic activities;Therapeutic exercise;Balance training;Patient/family education;Neuromuscular re-education;Dry needling;Manual techniques;Taping;Passive range of motion    PT Next Visit Plan Continued with TKR rehab    PT Home Exercise Plan QS, ball squeeze, hip abd, sit to stand with red loop around knees; 9/30:  Green tband  sitting for knee extension and flexion; 10/10:  SLR, SLRin ER with 4#; SAQ, SAQ in ER with 4#, sidelying hip abduction, addcution, prone hip extension and prone knee flexion with 4#    Consulted and Agree with Plan of Care Patient             Patient will benefit from skilled therapeutic intervention in order to improve the following deficits and impairments:  Abnormal gait, Decreased activity tolerance, Decreased mobility, Decreased strength, Decreased range of motion, Difficulty walking, Pain, Improper body mechanics  Visit Diagnosis: Acute pain of left knee  Difficulty in walking, not elsewhere classified  Muscle weakness (generalized)  Other symptoms and signs involving the musculoskeletal system     Problem List Patient Active Problem List   Diagnosis Date Noted   S/P revision of total knee, left  03/22/2021   Screening for colorectal cancer 11/29/2017   Well woman exam with routine gynecological exam 11/29/2017   Change in stool caliber 03/31/2016   Hx of adenomatous colonic polyps 03/31/2016   Superficial fungus infection of skin 11/25/2015   Urge incontinence 11/04/2014   S/P shoulder replacement 10/08/2014   Constipation - functional 10/31/2013   S/P right TH revision 12/25/2011   S/P right TKA 09/04/2011   Constipation 06/22/2011   Rectal bleeding 06/22/2011   Family history of colonic polyps 06/22/2011    Rayetta Humphrey, PT CLT [id number]  05/02/2021, 2:05 PM  [place], [place], [address] Phone: [phone]   Fax:  [phone]  Name: [name] MRN: [id number] Date of Birth: 25-Dec-1950

## 2021-05-04 ENCOUNTER — Encounter (HOSPITAL_COMMUNITY): Payer: Medicare Other

## 2021-05-04 DIAGNOSIS — Z96652 Presence of left artificial knee joint: Secondary | ICD-10-CM | POA: Diagnosis not present

## 2021-05-04 DIAGNOSIS — Z471 Aftercare following joint replacement surgery: Secondary | ICD-10-CM | POA: Diagnosis not present

## 2021-05-05 ENCOUNTER — Encounter (HOSPITAL_COMMUNITY): Payer: Self-pay | Admitting: Physical Therapy

## 2021-05-05 NOTE — Therapy (Signed)
[place], [place], [address] Phone: [phone]   Fax:  [phone]  Patient Details  Name: [name] MRN: [id number] Date of Birth: 10/16/1950 Referring Provider:    Encounter Date: 05/05/2021 PHYSICAL THERAPY DISCHARGE SUMMARY  Visits from Start of Care: 11  Current functional level related to goals / functional outcomes: Pt has improved in strength but continues to have shift with increased ROM   Remaining deficits: Civil engineer, contracting / Equipment: HEP   Patient agrees to discharge. Patient goals were partially met. Patient is being discharged due to the physician's request.  Rayetta Humphrey, PT CLT [id number]  05/05/2021, 8:50 AM  [place], [place], [address] Phone: [phone]   Fax:  [phone]

## 2021-05-06 ENCOUNTER — Encounter (HOSPITAL_COMMUNITY): Payer: Medicare Other

## 2021-05-09 ENCOUNTER — Encounter: Payer: Self-pay | Admitting: Internal Medicine

## 2021-05-20 ENCOUNTER — Encounter: Payer: Self-pay | Admitting: Internal Medicine

## 2021-06-15 DIAGNOSIS — Z96652 Presence of left artificial knee joint: Secondary | ICD-10-CM | POA: Diagnosis not present

## 2021-06-15 DIAGNOSIS — Z471 Aftercare following joint replacement surgery: Secondary | ICD-10-CM | POA: Diagnosis not present

## 2021-06-15 DIAGNOSIS — T8484XA Pain due to internal orthopedic prosthetic devices, implants and grafts, initial encounter: Secondary | ICD-10-CM | POA: Diagnosis not present

## 2021-06-16 NOTE — Progress Notes (Signed)
Surgery orders requested via Epic inbox. °

## 2021-06-23 NOTE — Progress Notes (Addendum)
COVID swab appointment: 06/24/21  COVID Vaccine Completed: yes x4 Date COVID Vaccine completed: 05/20/20, 09/03/19 Has received booster: 10/01/19, 01/24/21 COVID vaccine manufacturer: [place]   Date of COVID positive in last 90 days:  PCP - Sharilyn Sites, MD Cardiologist - Rozann Lesches, MD  Chest x-ray - n/a EKG - 08/31/20 Epic Stress Test - 09/16/20 Epic ECHO - 09/16/20 Epic Cardiac Cath - n/a Pacemaker/ICD device last checked: n/a Spinal Cord Stimulator: n/a  Sleep Study - yes, negative  CPAP -   Fasting Blood Sugar - pt denies DM or pre DM. States it just runs in her family Checks Blood Sugar __ times a day  Blood Thinner Instructions: Aspirin Instructions: ASA 81, hold 1 week Last Dose: 06/13/21   Activity level: Can go up a flight of stairs and perform activities of daily living without stopping and without symptoms of chest pain or shortness of breath.    Anesthesia review:   Patient denies shortness of breath, fever, cough and chest pain at PAT appointment   Patient verbalized understanding of instructions that were given to them at the PAT appointment. Patient was also instructed that they will need to review over the PAT instructions again at home before surgery.

## 2021-06-23 NOTE — Patient Instructions (Addendum)
DUE TO COVID-19 ONLY ONE VISITOR IS ALLOWED TO COME WITH YOU AND STAY IN THE WAITING ROOM ONLY DURING PRE OP AND PROCEDURE.   **NO VISITORS ARE ALLOWED IN THE SHORT STAY AREA OR RECOVERY ROOM!!**  IF YOU WILL BE ADMITTED INTO THE HOSPITAL YOU ARE ALLOWED ONLY TWO SUPPORT PEOPLE DURING VISITATION HOURS ONLY (7AM -8PM)   The support person(s) may change daily. The support person(s) must pass our screening, gel in and out, and wear a mask at all times, including in the patients room. Patients must also wear a mask when staff or their support person are in the room.  No visitors under the age of 62. Any visitor under the age of 56 must be accompanied by an adult.    COVID SWAB TESTING MUST BE COMPLETED ON:  06/24/21 **MUST PRESENT COMPLETED FORM AT TESTING SITE**    [facility] [place] (backside of the building) Open 8am-3pm. No appointment needed You are not required to quarantine, however you are required to wear a well-fitted mask when you are out and around people not in your household.  Hand Hygiene often Do NOT share personal items Notify your provider if you are in close contact with someone who has COVID or you develop fever 100.4 or greater, new onset of sneezing, cough, sore throat, shortness of breath or body aches.       Your procedure is scheduled on: 06/28/21   Report to [facility] Main Entrance    Report to admitting at 12:05 PM   Call this number if you have problems the morning of surgery [phone]   Do not eat food :After Midnight.   May have liquids until 11:20 AM day of surgery  CLEAR LIQUID DIET  Foods Allowed                                                                     Foods Excluded  Water, Black Coffee and tea (no milk or creamer)           liquids that you cannot  Plain Jell-O in any flavor  (No red)                                    see through such as: Fruit ices (not with fruit pulp)                                             milk, soups, orange juice              Iced Popsicles (No red)                                                All solid food  Apple juices Sports drinks like Gatorade (No red) Lightly seasoned clear broth or consume(fat free) Sugar     The day of surgery:  Drink ONE (1) Pre-Surgery G2 by 11:20 am the morning of surgery. Drink in one sitting. Do not sip.  This drink was given to you during your hospital  pre-op appointment visit. Nothing else to drink after completing the  Pre-Surgery G2.          If you have questions, please contact your surgeons office.     Oral Hygiene is also important to reduce your risk of infection.                                    Remember - BRUSH YOUR TEETH THE MORNING OF SURGERY WITH YOUR REGULAR TOOTHPASTE   Take these medicines the morning of surgery with A SIP OF WATER: None                              You may not have any metal on your body including hair pins, jewelry, and body piercing             Do not wear make-up, lotions, powders, perfumes, or deodorant  Do not wear nail polish including gel and S&S, artificial/acrylic nails, or any other type of covering on natural nails including finger and toenails. If you have artificial nails, gel coating, etc. that needs to be removed by a nail salon please have this removed prior to surgery or surgery may need to be canceled/ delayed if the surgeon/ anesthesia feels like they are unable to be safely monitored.   Do not shave  48 hours prior to surgery.    Do not bring valuables to the hospital. [place].   Bring small overnight bag day of surgery.              Please read over the following fact sheets you were given: IF YOU HAVE QUESTIONS ABOUT YOUR PRE-OP INSTRUCTIONS PLEASE CALL [facility] - Preparing for Surgery Before surgery, you can play an important role.  Because skin is not  sterile, your skin needs to be as free of germs as possible.  You can reduce the number of germs on your skin by washing with CHG (chlorahexidine gluconate) soap before surgery.  CHG is an antiseptic cleaner which kills germs and bonds with the skin to continue killing germs even after washing. Please DO NOT use if you have an allergy to CHG or antibacterial soaps.  If your skin becomes reddened/irritated stop using the CHG and inform your nurse when you arrive at Short Stay. Do not shave (including legs and underarms) for at least 48 hours prior to the first CHG shower.  You may shave your face/neck.  Please follow these instructions carefully:  1.  Shower with CHG Soap the night before surgery and the  morning of surgery.  2.  If you choose to wash your hair, wash your hair first as usual with your normal  shampoo.  3.  After you shampoo, rinse your hair and body thoroughly to remove the shampoo.  4.  Use CHG as you would any other liquid soap.  You can apply chg directly to the skin and wash.  Gently with a scrungie or clean washcloth.  5.  Apply the CHG Soap to your body ONLY FROM THE NECK DOWN.   Do   not use on face/ open                           Wound or open sores. Avoid contact with eyes, ears mouth and   genitals (private parts).                       Wash face,  Genitals (private parts) with your normal soap.             6.  Wash thoroughly, paying special attention to the area where your    surgery  will be performed.  7.  Thoroughly rinse your body with warm water from the neck down.  8.  DO NOT shower/wash with your normal soap after using and rinsing off the CHG Soap.                9.  Pat yourself dry with a clean towel.            10.  Wear clean pajamas.            11.  Place clean sheets on your bed the night of your first shower and do not  sleep with pets. Day of Surgery : Do not apply any lotions/deodorants the morning of surgery.  Please wear  clean clothes to the hospital/surgery center.  FAILURE TO FOLLOW THESE INSTRUCTIONS MAY RESULT IN THE CANCELLATION OF YOUR SURGERY  PATIENT SIGNATURE_________________________________  NURSE SIGNATURE__________________________________  ________________________________________________________________________   Adam Phenix  An incentive spirometer is a tool that can help keep your lungs clear and active. This tool measures how well you are filling your lungs with each breath. Taking long deep breaths may help reverse or decrease the chance of developing breathing (pulmonary) problems (especially infection) following: A long period of time when you are unable to move or be active. BEFORE THE PROCEDURE  If the spirometer includes an indicator to show your best effort, your nurse or respiratory therapist will set it to a desired goal. If possible, sit up straight or lean slightly forward. Try not to slouch. Hold the incentive spirometer in an upright position. INSTRUCTIONS FOR USE  Sit on the edge of your bed if possible, or sit up as far as you can in bed or on a chair. Hold the incentive spirometer in an upright position. Breathe out normally. Place the mouthpiece in your mouth and seal your lips tightly around it. Breathe in slowly and as deeply as possible, raising the piston or the ball toward the top of the column. Hold your breath for 3-5 seconds or for as long as possible. Allow the piston or ball to fall to the bottom of the column. Remove the mouthpiece from your mouth and breathe out normally. Rest for a few seconds and repeat Steps 1 through 7 at least 10 times every 1-2 hours when you are awake. Take your time and take a few normal breaths between deep breaths. The spirometer may include an indicator to show your best effort. Use the indicator as a goal to work toward during each repetition. After each set of 10 deep breaths, practice coughing to be sure your  lungs are  clear. If you have an incision (the cut made at the time of surgery), support your incision when coughing by placing a pillow or rolled up towels firmly against it. Once you are able to get out of bed, walk around indoors and cough well. You may stop using the incentive spirometer when instructed by your caregiver.  RISKS AND COMPLICATIONS Take your time so you do not get dizzy or light-headed. If you are in pain, you may need to take or ask for pain medication before doing incentive spirometry. It is harder to take a deep breath if you are having pain. AFTER USE Rest and breathe slowly and easily. It can be helpful to keep track of a log of your progress. Your caregiver can provide you with a simple table to help with this. If you are using the spirometer at home, follow these instructions: [place] IF:  You are having difficultly using the spirometer. You have trouble using the spirometer as often as instructed. Your pain medication is not giving enough relief while using the spirometer. You develop fever of 100.5 F (38.1 C) or higher. SEEK IMMEDIATE MEDICAL CARE IF:  You cough up bloody sputum that had not been present before. You develop fever of 102 F (38.9 C) or greater. You develop worsening pain at or near the incision site. MAKE SURE YOU:  Understand these instructions. Will watch your condition. Will get help right away if you are not doing well or get worse. Document Released: 11/06/2006 Document Revised: 09/18/2011 Document Reviewed: 01/07/2007 Exit[employer] Patient Information 2014 Exit[employer], [place].   ________________________________________________________________________  WHAT IS A BLOOD TRANSFUSION? Blood Transfusion Information  A transfusion is the replacement of blood or some of its parts. Blood is made up of multiple cells which provide different functions. Red blood cells carry oxygen and are used for blood loss replacement. White blood cells fight against  infection. Platelets control bleeding. Plasma helps clot blood. Other blood products are available for specialized needs, such as hemophilia or other clotting disorders. BEFORE THE TRANSFUSION  Who gives blood for transfusions?  Healthy volunteers who are fully evaluated to make sure their blood is safe. This is blood bank blood. Transfusion therapy is the safest it has ever been in the practice of medicine. Before blood is taken from a donor, a complete history is taken to make sure that person has no history of diseases nor engages in risky social behavior (examples are intravenous drug use or sexual activity with multiple partners). The donor's travel history is screened to minimize risk of transmitting infections, such as malaria. The donated blood is tested for signs of infectious diseases, such as HIV and hepatitis. The blood is then tested to be sure it is compatible with you in order to minimize the chance of a transfusion reaction. If you or a relative donates blood, this is often done in anticipation of surgery and is not appropriate for emergency situations. It takes many days to process the donated blood. RISKS AND COMPLICATIONS Although transfusion therapy is very safe and saves many lives, the main dangers of transfusion include:  Getting an infectious disease. Developing a transfusion reaction. This is an allergic reaction to something in the blood you were given. Every precaution is taken to prevent this. The decision to have a blood transfusion has been considered carefully by your caregiver before blood is given. Blood is not given unless the benefits outweigh the risks. AFTER THE TRANSFUSION Right after receiving a blood transfusion,  you will usually feel much better and more energetic. This is especially true if your red blood cells have gotten low (anemic). The transfusion raises the level of the red blood cells which carry oxygen, and this usually causes an energy increase. The  nurse administering the transfusion will monitor you carefully for complications. HOME CARE INSTRUCTIONS  No special instructions are needed after a transfusion. You may find your energy is better. Speak with your caregiver about any limitations on activity for underlying diseases you may have. SEEK MEDICAL CARE IF:  Your condition is not improving after your transfusion. You develop redness or irritation at the intravenous (IV) site. SEEK IMMEDIATE MEDICAL CARE IF:  Any of the following symptoms occur over the next 12 hours: Shaking chills. You have a temperature by mouth above 102 F (38.9 C), not controlled by medicine. Chest, back, or muscle pain. People around you feel you are not acting correctly or are confused. Shortness of breath or difficulty breathing. Dizziness and fainting. You get a rash or develop hives. You have a decrease in urine output. Your urine turns a dark color or changes to pink, red, or brown. Any of the following symptoms occur over the next 10 days: You have a temperature by mouth above 102 F (38.9 C), not controlled by medicine. Shortness of breath. Weakness after normal activity. The white part of the eye turns yellow (jaundice). You have a decrease in the amount of urine or are urinating less often. Your urine turns a dark color or changes to pink, red, or brown. Document Released: 06/23/2000 Document Revised: 09/18/2011 Document Reviewed: 02/10/2008 [facility] Patient Information 2014 [place], [place].  _______________________________________________________________________

## 2021-06-24 ENCOUNTER — Other Ambulatory Visit: Payer: Self-pay | Admitting: Orthopedic Surgery

## 2021-06-24 ENCOUNTER — Encounter (HOSPITAL_COMMUNITY)
Admission: RE | Admit: 2021-06-24 | Discharge: 2021-06-24 | Disposition: A | Discharge status: Home / Self Care | Payer: Medicare Other | Source: Ambulatory Visit | Attending: Orthopedic Surgery | Admitting: Orthopedic Surgery

## 2021-06-24 ENCOUNTER — Other Ambulatory Visit: Payer: Self-pay

## 2021-06-24 ENCOUNTER — Encounter (HOSPITAL_COMMUNITY): Payer: Self-pay

## 2021-06-24 VITALS — BP 130/86 | HR 80 | Resp 18 | Ht 61.0 in | Wt 219.8 lb

## 2021-06-24 DIAGNOSIS — E119 Type 2 diabetes mellitus without complications: Secondary | ICD-10-CM

## 2021-06-24 DIAGNOSIS — Z96652 Presence of left artificial knee joint: Secondary | ICD-10-CM | POA: Insufficient documentation

## 2021-06-24 DIAGNOSIS — Z01812 Encounter for preprocedural laboratory examination: Secondary | ICD-10-CM | POA: Diagnosis not present

## 2021-06-24 HISTORY — DX: Gastro-esophageal reflux disease without esophagitis: K21.9

## 2021-06-24 HISTORY — DX: Cardiac murmur, unspecified: R01.1

## 2021-06-24 LAB — CBC
HCT: 37.8 % (ref 36.0–46.0)
Hemoglobin: 12 g/dL (ref 12.0–15.0)
MCH: 24.8 pg — ABNORMAL LOW (ref 26.0–34.0)
MCHC: 31.7 g/dL (ref 30.0–36.0)
MCV: 78.3 fL — ABNORMAL LOW (ref 80.0–100.0)
Platelets: 306 10*3/uL (ref 150–400)
RBC: 4.83 MIL/uL (ref 3.87–5.11)
RDW: 16.6 % — ABNORMAL HIGH (ref 11.5–15.5)
WBC: 4.3 10*3/uL (ref 4.0–10.5)
nRBC: 0 % (ref 0.0–0.2)

## 2021-06-24 LAB — COMPREHENSIVE METABOLIC PANEL
ALT: 14 U/L (ref 0–44)
AST: 22 U/L (ref 15–41)
Albumin: 4 g/dL (ref 3.5–5.0)
Alkaline Phosphatase: 68 U/L (ref 38–126)
Anion gap: 6 (ref 5–15)
BUN: 14 mg/dL (ref 8–23)
CO2: 25 mmol/L (ref 22–32)
Calcium: 9.5 mg/dL (ref 8.9–10.3)
Chloride: 105 mmol/L (ref 98–111)
Creatinine, Ser: 0.78 mg/dL (ref 0.44–1.00)
GFR, Estimated: >60 mL/min (ref >60)
Glucose, Bld: 99 mg/dL (ref 70–99)
Potassium: 3.9 mmol/L (ref 3.5–5.1)
Sodium: 136 mmol/L (ref 135–145)
Total Bilirubin: 0.6 mg/dL (ref 0.3–1.2)
Total Protein: 7.6 g/dL (ref 6.5–8.1)

## 2021-06-24 LAB — SARS CORONAVIRUS 2 (TAT 6-24 HRS): SARS Coronavirus 2: NEGATIVE

## 2021-06-24 LAB — HEMOGLOBIN A1C
Hgb A1c MFr Bld: 5.9 % — ABNORMAL HIGH (ref 4.8–5.6)
Mean Plasma Glucose: 122.63 mg/dL

## 2021-06-24 LAB — GLUCOSE, CAPILLARY: Glucose-Capillary: 97 mg/dL (ref 70–99)

## 2021-06-24 NOTE — H&P (Signed)
PATELLOFEMORAL REVISION ADMISSION H&P  Patient is being admitted for left revision total knee arthroplasty.  Subjective:  Chief Complaint:left knee pain & mechanical symptoms related to patellar clunk  HPI: [name], 70 y.o. female, has a history of pain and functional disability in the left knee(s) due to  patellar clunk  and patient has failed non-surgical conservative treatments for greater than 12 weeks to include NSAID's and/or analgesics, flexibility and strengthening excercises, and supervised PT with diminished ADL's post treatment. She had a primary left total knee arthroplasty by Dr. Alvan Dame on 03/22/21. She reported a popping sensation about the kneecap just after surgery, which did not resolve over the first several months of recovery. At her 3 month post op visit, Dr. Alvan Dame discussed proceeding with patellar revision as well as intra-op evaluation for source of mechanical symptoms. The patient is in agreement.   Patient Active Problem List   Diagnosis Date Noted   S/P revision of total knee, left 03/22/2021   Screening for colorectal cancer 11/29/2017   Well woman exam with routine gynecological exam 11/29/2017   Change in stool caliber 03/31/2016   Hx of adenomatous colonic polyps 03/31/2016   Superficial fungus infection of skin 11/25/2015   Urge incontinence 11/04/2014   S/P shoulder replacement 10/08/2014   Constipation - functional 10/31/2013   S/P right TH revision 12/25/2011   S/P right TKA 09/04/2011   Constipation 06/22/2011   Rectal bleeding 06/22/2011   Family history of colonic polyps 06/22/2011   Past Medical History:  Diagnosis Date   Anxiety    Constipation    Essential hypertension    Headache    Hyperlipidemia    Insomnia    Obesity    Osteoarthritis    Superficial fungus infection of skin 11/25/2015   Type 2 diabetes mellitus ([place])    Urge incontinence 11/04/2014   Vertigo    Vitamin D deficiency     Past Surgical History:  Procedure Laterality  Date   ABDOMINAL HYSTERECTOMY  1989   CARPAL TUNNEL RELEASE Right 06/2016   right middle finger fused; wire in right thumb   COLONOSCOPY  10/20/02   normal rectum/left-sided diverticula   COLONOSCOPY  07/06/2011   Dr. Gala Romney: Dionisio David anal canal hemorrhoids, diminutive rectal and transverse colon polyps. 2 adenoma on path. Left-sided diverticulosis.   COLONOSCOPY N/A 04/21/2016   Procedure: COLONOSCOPY;  Surgeon: Daneil Dolin, MD;  Location: [facility];  Service: Endoscopy;  Laterality: N/A;  930    JOINT REPLACEMENT     PARTIAL HYSTERECTOMY  1987   TOE SURGERY     second toe on both feet, bunionectomy   TOTAL HIP ARTHROPLASTY  2001/2008   multiple surgeries on right hip including replacements. Two replacements and four dislocations.    TOTAL HIP ARTHROPLASTY  2010   left   TOTAL HIP REVISION  12/25/2011   Procedure: TOTAL HIP REVISION;  Surgeon: Mauri Pole, MD;  Location: [facility];  Service: Orthopedics;  Laterality: Right;   TOTAL KNEE ARTHROPLASTY  2008   left   TOTAL KNEE ARTHROPLASTY  09/04/2011   Procedure: TOTAL KNEE ARTHROPLASTY;  Surgeon: Mauri Pole, MD;  Location: [facility];  Service: Orthopedics;  Laterality: Right;   TOTAL KNEE REVISION Left 03/22/2021   Procedure: TOTAL KNEE REVISION;  Surgeon: Paralee Cancel, MD;  Location: [facility];  Service: Orthopedics;  Laterality: Left;   TOTAL SHOULDER ARTHROPLASTY Left 10/08/2014   Procedure: LEFT TOTAL SHOULDER ARTHROPLASTY;  Surgeon: Justice Britain, MD;  Location:  [facility];  Service: Orthopedics;  Laterality: Left;   TOTAL SHOULDER REPLACEMENT Left 10/08/2014   dr supple    No current facility-administered medications for this encounter.   Current Outpatient Medications  Medication Sig Dispense Refill Last Dose   cholecalciferol (VITAMIN D) 25 MCG (1000 UNIT) tablet Take 1,000 Units by mouth daily.      docusate sodium (COLACE) 100 MG capsule Take 1 capsule (100 mg total) by mouth 2 (two) times daily. (Patient taking differently:  Take 200 mg by mouth daily.) 10 capsule 0    Flaxseed, Linseed, (FLAXSEED OIL) 1000 MG CAPS Take 1,000 mg by mouth daily.      hydroxychloroquine (PLAQUENIL) 200 MG tablet Take 400 mg by mouth daily.      lisinopril-hydrochlorothiazide (PRINZIDE,ZESTORETIC) 20-12.5 MG per tablet Take 1 tablet by mouth daily before breakfast.      Omega-3 1000 MG CAPS Take 1,000 mg by mouth daily.      polyethylene glycol powder (GLYCOLAX/MIRALAX) 17 GM/SCOOP powder Take 17 g by mouth daily as needed for moderate constipation.      zolpidem (AMBIEN) 10 MG tablet Take 10 mg by mouth at bedtime.      No Known Allergies  Social History   Tobacco Use   Smoking status: Never   Smokeless tobacco: Never  Substance Use Topics   Alcohol use: No    Family History  Problem Relation Age of Onset   Heart failure Mother        Died 53   Diabetes Mother    Hypertension Mother    Early death Father    Pneumonia Father    Alcohol abuse Father    Heart disease Brother    Hypertension Brother    Diabetes Brother    Hypertension Sister    Hypertension Sister    Sickle cell trait Brother    GER disease Brother    Thyroid disease Daughter    Heart failure Daughter    Heart attack Daughter    Heart disease Maternal Aunt    Heart disease Maternal Uncle    Colon cancer Neg Hx       Review of Systems  Constitutional:  Negative for chills and fever.  Respiratory:  Negative for cough and shortness of breath.   Cardiovascular:  Negative for chest pain.  Gastrointestinal:  Negative for nausea and vomiting.  Musculoskeletal:  Positive for arthralgias.     Objective:  Physical Exam Well nourished and well developed. General: Alert and oriented x3, cooperative and pleasant, no acute distress. Head: normocephalic, atraumatic, neck supple. Eyes: EOMI.  Musculoskeletal: Left knee exam: Her surgical incision is well-healed without signs of infection Assessment of her range of motion reveals that with active  motion there does not appear to be significant patella movement or maltracking however there is a persistent mechanical sensation identified with flexion and extension in the anterior aspect of her knee. On examination alone I am unable to identify what this could be at this point. No lower extremity edema or erythema  Calves soft and nontender. Motor function intact in LE. Strength 5/5 LE bilaterally. Neuro: Distal pulses 2+. Sensation to light touch intact in LE.  Vital signs in last 24 hours: BP: ()/()  Arterial Line BP: ()/()   Labs:  Estimated body mass index is 39.91 kg/m as calculated from the following:   Height as of 03/22/21: 5' 2.75" (1.594 m).   Weight as of 03/22/21: 101.4 kg.  Imaging Review Imaging: Standing AP and lateral  of her left knee were reviewed from her last visit. These radiographs reveal stable well fixed and aligned revised femoral and tibial components.   Today I ordered a sunrise view of her left knee. Her patella appears to be located symmetrically within the trochlea however I cannot determine adequately whether or not there could be either malpositioning or some other issue related to the patella despite the appearance that the patella button remains cemented in place.    Assessment/Plan:  Mechanical symptoms related to the patella, right knee(s) with failed previous arthroplasty.   The patient history, physical examination, clinical judgment of the provider and imaging studies are consistent with mechanical symptoms of the right knee(s), previous total knee arthroplasty. Revision total knee arthroplasty is deemed medically necessary. The treatment options including medical management, injection therapy, arthroscopy and revision arthroplasty were discussed at length. The risks and benefits of revision total knee arthroplasty were presented and reviewed. The risks due to aseptic loosening, infection, stiffness, patella tracking problems, thromboembolic  complications and other imponderables were discussed. The patient acknowledged the explanation, agreed to proceed with the plan and consent was signed. Patient is being admitted for inpatient treatment for surgery, pain control, PT, OT, prophylactic antibiotics, VTE prophylaxis, progressive ambulation and ADL's and discharge planning.The patient is planning to be discharged  home.   Therapy Plans: outpatient therapy at [facility] Disposition: Home with Planned DVT Prophylaxis: aspirin 81mg  BID DME needed: none PCP: Dr. Hilma Favors, clearance received TXA: IV Allergies: NKDA Anesthesia Concerns: none BMI: 41.6 Last HgbA1c: Not diabetic   Other: - Oxycodone, robaxin, celebrex, aspirin - may want 3-n-1  Costella Hatcher, PA-C Orthopedic Surgery [facility] [place] [phone]

## 2021-06-28 ENCOUNTER — Inpatient Hospital Stay (HOSPITAL_COMMUNITY): Payer: Medicare Other | Admitting: Anesthesiology

## 2021-06-28 ENCOUNTER — Encounter (HOSPITAL_COMMUNITY)
Admission: RE | Disposition: A | Discharge status: Home / Self Care | Payer: Self-pay | Source: Ambulatory Visit | Attending: Orthopedic Surgery

## 2021-06-28 ENCOUNTER — Other Ambulatory Visit: Payer: Self-pay

## 2021-06-28 ENCOUNTER — Encounter (HOSPITAL_COMMUNITY): Payer: Self-pay | Admitting: Orthopedic Surgery

## 2021-06-28 ENCOUNTER — Inpatient Hospital Stay (HOSPITAL_COMMUNITY)
Admission: RE | Admit: 2021-06-28 | Discharge: 2021-06-29 | DRG: 464 | Disposition: A | Discharge status: Home / Self Care | Payer: Medicare Other | Source: Ambulatory Visit | Attending: Orthopedic Surgery | Admitting: Orthopedic Surgery

## 2021-06-28 DIAGNOSIS — Z8249 Family history of ischemic heart disease and other diseases of the circulatory system: Secondary | ICD-10-CM | POA: Diagnosis not present

## 2021-06-28 DIAGNOSIS — Z96643 Presence of artificial hip joint, bilateral: Secondary | ICD-10-CM | POA: Diagnosis not present

## 2021-06-28 DIAGNOSIS — Z811 Family history of alcohol abuse and dependence: Secondary | ICD-10-CM | POA: Diagnosis not present

## 2021-06-28 DIAGNOSIS — Z96651 Presence of right artificial knee joint: Secondary | ICD-10-CM | POA: Diagnosis present

## 2021-06-28 DIAGNOSIS — Z96652 Presence of left artificial knee joint: Secondary | ICD-10-CM | POA: Diagnosis not present

## 2021-06-28 DIAGNOSIS — Z832 Family history of diseases of the blood and blood-forming organs and certain disorders involving the immune mechanism: Secondary | ICD-10-CM

## 2021-06-28 DIAGNOSIS — T84093A Other mechanical complication of internal left knee prosthesis, initial encounter: Principal | ICD-10-CM | POA: Diagnosis present

## 2021-06-28 DIAGNOSIS — Z833 Family history of diabetes mellitus: Secondary | ICD-10-CM

## 2021-06-28 DIAGNOSIS — Z6841 Body Mass Index (BMI) 40.0 and over, adult: Secondary | ICD-10-CM | POA: Diagnosis not present

## 2021-06-28 DIAGNOSIS — G8918 Other acute postprocedural pain: Secondary | ICD-10-CM | POA: Diagnosis not present

## 2021-06-28 DIAGNOSIS — E119 Type 2 diabetes mellitus without complications: Secondary | ICD-10-CM | POA: Diagnosis not present

## 2021-06-28 DIAGNOSIS — Z79899 Other long term (current) drug therapy: Secondary | ICD-10-CM | POA: Diagnosis not present

## 2021-06-28 DIAGNOSIS — Y793 Surgical instruments, materials and orthopedic devices (including sutures) associated with adverse incidents: Secondary | ICD-10-CM | POA: Diagnosis present

## 2021-06-28 DIAGNOSIS — Z8371 Family history of colonic polyps: Secondary | ICD-10-CM | POA: Diagnosis not present

## 2021-06-28 DIAGNOSIS — E559 Vitamin D deficiency, unspecified: Secondary | ICD-10-CM | POA: Diagnosis present

## 2021-06-28 DIAGNOSIS — I10 Essential (primary) hypertension: Secondary | ICD-10-CM | POA: Diagnosis not present

## 2021-06-28 DIAGNOSIS — Z96612 Presence of left artificial shoulder joint: Secondary | ICD-10-CM | POA: Diagnosis present

## 2021-06-28 DIAGNOSIS — E785 Hyperlipidemia, unspecified: Secondary | ICD-10-CM | POA: Diagnosis not present

## 2021-06-28 DIAGNOSIS — T8484XA Pain due to internal orthopedic prosthetic devices, implants and grafts, initial encounter: Secondary | ICD-10-CM | POA: Diagnosis not present

## 2021-06-28 HISTORY — PX: SCAR DEBRIDEMENT OF TOTAL KNEE: SHX6544

## 2021-06-28 LAB — TYPE AND SCREEN
ABO/RH(D): O NEG
Antibody Screen: NEGATIVE

## 2021-06-28 LAB — GLUCOSE, CAPILLARY
Glucose-Capillary: 98 mg/dL (ref 70–99)
Glucose-Capillary: 108 mg/dL — ABNORMAL HIGH (ref 70–99)

## 2021-06-28 SURGERY — REVISION, SCAR, KNEE
Anesthesia: General | Site: Knee | Laterality: Left

## 2021-06-28 MED ORDER — BUPIVACAINE-EPINEPHRINE (PF) 0.25% -1:200000 IJ SOLN
INTRAMUSCULAR | Status: AC
  Filled 2021-06-28: qty 30

## 2021-06-28 MED ORDER — FENTANYL CITRATE (PF) 100 MCG/2ML IJ SOLN
INTRAMUSCULAR | Status: DC | PRN
  Administered 2021-06-28: 50 ug via INTRAVENOUS
  Administered 2021-06-28 (×2): 25 ug via INTRAVENOUS

## 2021-06-28 MED ORDER — FENTANYL CITRATE PF 50 MCG/ML IJ SOSY
PREFILLED_SYRINGE | INTRAMUSCULAR | Status: AC
  Administered 2021-06-28: 14:00:00 50 ug
  Filled 2021-06-28: qty 2

## 2021-06-28 MED ORDER — CHLORHEXIDINE GLUCONATE 0.12 % MT SOLN
15.0000 mL | Freq: Once | OROMUCOSAL | Status: AC
  Administered 2021-06-28: 11:00:00 15 mL via OROMUCOSAL

## 2021-06-28 MED ORDER — ROPIVACAINE HCL 5 MG/ML IJ SOLN
INTRAMUSCULAR | Status: DC | PRN
  Administered 2021-06-28: 30 mL via PERINEURAL

## 2021-06-28 MED ORDER — SODIUM CHLORIDE (PF) 0.9 % IJ SOLN
INTRAMUSCULAR | Status: AC
  Filled 2021-06-28: qty 30

## 2021-06-28 MED ORDER — MIDAZOLAM HCL 2 MG/2ML IJ SOLN: 1.0000 mg | INTRAMUSCULAR | Status: DC

## 2021-06-28 MED ORDER — OXYCODONE HCL 5 MG PO TABS: 10.0000 mg | ORAL_TABLET | ORAL | Status: DC | PRN

## 2021-06-28 MED ORDER — TRANEXAMIC ACID-NACL 1000-0.7 MG/100ML-% IV SOLN
1000.0000 mg | INTRAVENOUS | Status: AC
  Administered 2021-06-28: 14:00:00 1000 mg via INTRAVENOUS
  Filled 2021-06-28: qty 100

## 2021-06-28 MED ORDER — POVIDONE-IODINE 10 % EX SWAB
2.0000 "application " | Freq: Once | CUTANEOUS | Status: AC
  Administered 2021-06-28: 2 via TOPICAL

## 2021-06-28 MED ORDER — DEXAMETHASONE SODIUM PHOSPHATE 10 MG/ML IJ SOLN
10.0000 mg | Freq: Once | INTRAMUSCULAR | Status: AC
  Administered 2021-06-29: 10:00:00 10 mg via INTRAVENOUS
  Filled 2021-06-28: qty 1

## 2021-06-28 MED ORDER — CELECOXIB 200 MG PO CAPS
200.0000 mg | ORAL_CAPSULE | Freq: Two times a day (BID) | ORAL | Status: DC
  Administered 2021-06-28 – 2021-06-29 (×2): 200 mg via ORAL
  Filled 2021-06-28 (×2): qty 1

## 2021-06-28 MED ORDER — LACTATED RINGERS IV SOLN: INTRAVENOUS | Status: DC

## 2021-06-28 MED ORDER — ONDANSETRON HCL 4 MG/2ML IJ SOLN: 4.0000 mg | Freq: Four times a day (QID) | INTRAMUSCULAR | Status: DC | PRN

## 2021-06-28 MED ORDER — AMISULPRIDE (ANTIEMETIC) 5 MG/2ML IV SOLN: 10.0000 mg | Freq: Once | INTRAVENOUS | Status: DC | PRN

## 2021-06-28 MED ORDER — FENTANYL CITRATE (PF) 100 MCG/2ML IJ SOLN
INTRAMUSCULAR | Status: AC
  Filled 2021-06-28: qty 2

## 2021-06-28 MED ORDER — POLYETHYLENE GLYCOL 3350 17 G PO PACK: 17.0000 g | PACK | Freq: Every day | ORAL | Status: DC | PRN

## 2021-06-28 MED ORDER — KETOROLAC TROMETHAMINE 30 MG/ML IJ SOLN
INTRAMUSCULAR | Status: DC | PRN
  Administered 2021-06-28: 30 mg

## 2021-06-28 MED ORDER — HYDROMORPHONE HCL 1 MG/ML IJ SOLN
INTRAMUSCULAR | Status: DC | PRN
  Administered 2021-06-28: .5 mg via INTRAVENOUS
  Administered 2021-06-28: 1 mg via INTRAVENOUS
  Administered 2021-06-28: .5 mg via INTRAVENOUS

## 2021-06-28 MED ORDER — ASPIRIN 81 MG PO CHEW
81.0000 mg | CHEWABLE_TABLET | Freq: Two times a day (BID) | ORAL | Status: DC
  Administered 2021-06-28 – 2021-06-29 (×2): 81 mg via ORAL
  Filled 2021-06-28 (×2): qty 1

## 2021-06-28 MED ORDER — CEFAZOLIN SODIUM-DEXTROSE 2-4 GM/100ML-% IV SOLN
2.0000 g | INTRAVENOUS | Status: AC
  Administered 2021-06-28: 14:00:00 2 g via INTRAVENOUS
  Filled 2021-06-28: qty 100

## 2021-06-28 MED ORDER — FENTANYL CITRATE PF 50 MCG/ML IJ SOSY: 25.0000 ug | PREFILLED_SYRINGE | INTRAMUSCULAR | Status: DC | PRN

## 2021-06-28 MED ORDER — HYDROXYCHLOROQUINE SULFATE 200 MG PO TABS
400.0000 mg | ORAL_TABLET | Freq: Every day | ORAL | Status: DC
  Administered 2021-06-28 – 2021-06-29 (×2): 400 mg via ORAL
  Filled 2021-06-28 (×2): qty 2

## 2021-06-28 MED ORDER — HYDROMORPHONE HCL 1 MG/ML IJ SOLN: 0.5000 mg | INTRAMUSCULAR | Status: DC | PRN

## 2021-06-28 MED ORDER — PHENOL 1.4 % MT LIQD: 1.0000 | OROMUCOSAL | Status: DC | PRN

## 2021-06-28 MED ORDER — LISINOPRIL 20 MG PO TABS: 20.0000 mg | ORAL_TABLET | Freq: Every day | ORAL | Status: DC

## 2021-06-28 MED ORDER — ACETAMINOPHEN 500 MG PO TABS
1000.0000 mg | ORAL_TABLET | Freq: Once | ORAL | Status: AC
  Administered 2021-06-28: 11:00:00 1000 mg via ORAL
  Filled 2021-06-28: qty 2

## 2021-06-28 MED ORDER — DEXMEDETOMIDINE (PRECEDEX) IN NS 20 MCG/5ML (4 MCG/ML) IV SYRINGE
PREFILLED_SYRINGE | INTRAVENOUS | Status: AC
  Filled 2021-06-28: qty 5

## 2021-06-28 MED ORDER — OXYCODONE HCL 5 MG/5ML PO SOLN: 5.0000 mg | Freq: Once | ORAL | Status: DC | PRN

## 2021-06-28 MED ORDER — HYDROMORPHONE HCL 2 MG/ML IJ SOLN
INTRAMUSCULAR | Status: AC
  Filled 2021-06-28: qty 1

## 2021-06-28 MED ORDER — SODIUM CHLORIDE 0.9 % IR SOLN
Status: DC | PRN
  Administered 2021-06-28 (×2): 1000 mL

## 2021-06-28 MED ORDER — OXYCODONE HCL 5 MG PO TABS
5.0000 mg | ORAL_TABLET | ORAL | Status: DC | PRN
  Administered 2021-06-29: 10:00:00 5 mg via ORAL
  Filled 2021-06-28: qty 1

## 2021-06-28 MED ORDER — SODIUM CHLORIDE 0.9 % IV SOLN: INTRAVENOUS | Status: DC

## 2021-06-28 MED ORDER — CEFAZOLIN SODIUM-DEXTROSE 2-4 GM/100ML-% IV SOLN
2.0000 g | Freq: Four times a day (QID) | INTRAVENOUS | Status: AC
  Administered 2021-06-28 – 2021-06-29 (×2): 2 g via INTRAVENOUS
  Filled 2021-06-28 (×2): qty 100

## 2021-06-28 MED ORDER — FERROUS SULFATE 325 (65 FE) MG PO TABS
325.0000 mg | ORAL_TABLET | Freq: Three times a day (TID) | ORAL | Status: DC
  Administered 2021-06-28 – 2021-06-29 (×2): 325 mg via ORAL
  Filled 2021-06-28 (×2): qty 1

## 2021-06-28 MED ORDER — FENTANYL CITRATE PF 50 MCG/ML IJ SOSY: 50.0000 ug | PREFILLED_SYRINGE | INTRAMUSCULAR | Status: DC

## 2021-06-28 MED ORDER — BUPIVACAINE-EPINEPHRINE 0.25% -1:200000 IJ SOLN
INTRAMUSCULAR | Status: DC | PRN
  Administered 2021-06-28: 30 mL

## 2021-06-28 MED ORDER — OXYCODONE HCL 5 MG PO TABS: 5.0000 mg | ORAL_TABLET | Freq: Once | ORAL | Status: DC | PRN

## 2021-06-28 MED ORDER — 0.9 % SODIUM CHLORIDE (POUR BTL) OPTIME
TOPICAL | Status: DC | PRN
  Administered 2021-06-28: 14:00:00 1000 mL

## 2021-06-28 MED ORDER — METOCLOPRAMIDE HCL 5 MG PO TABS: 5.0000 mg | ORAL_TABLET | Freq: Three times a day (TID) | ORAL | Status: DC | PRN

## 2021-06-28 MED ORDER — METHOCARBAMOL 500 MG IVPB - SIMPLE MED
500.0000 mg | Freq: Four times a day (QID) | INTRAVENOUS | Status: DC | PRN
  Filled 2021-06-28: qty 50

## 2021-06-28 MED ORDER — PHENYLEPHRINE 40 MCG/ML (10ML) SYRINGE FOR IV PUSH (FOR BLOOD PRESSURE SUPPORT)
PREFILLED_SYRINGE | INTRAVENOUS | Status: AC
  Filled 2021-06-28: qty 10

## 2021-06-28 MED ORDER — DOCUSATE SODIUM 100 MG PO CAPS
100.0000 mg | ORAL_CAPSULE | Freq: Two times a day (BID) | ORAL | Status: DC
  Administered 2021-06-28 – 2021-06-29 (×2): 100 mg via ORAL
  Filled 2021-06-28 (×2): qty 1

## 2021-06-28 MED ORDER — STERILE WATER FOR IRRIGATION IR SOLN
Status: DC | PRN
  Administered 2021-06-28: 2000 mL

## 2021-06-28 MED ORDER — ORAL CARE MOUTH RINSE: 15.0000 mL | Freq: Once | OROMUCOSAL | Status: AC

## 2021-06-28 MED ORDER — ONDANSETRON HCL 4 MG/2ML IJ SOLN: 4.0000 mg | Freq: Once | INTRAMUSCULAR | Status: DC | PRN

## 2021-06-28 MED ORDER — KETOROLAC TROMETHAMINE 30 MG/ML IJ SOLN
INTRAMUSCULAR | Status: AC
  Filled 2021-06-28: qty 1

## 2021-06-28 MED ORDER — PHENYLEPHRINE 40 MCG/ML (10ML) SYRINGE FOR IV PUSH (FOR BLOOD PRESSURE SUPPORT)
PREFILLED_SYRINGE | INTRAVENOUS | Status: DC | PRN
  Administered 2021-06-28 (×2): 80 ug via INTRAVENOUS
  Administered 2021-06-28: 120 ug via INTRAVENOUS

## 2021-06-28 MED ORDER — METHOCARBAMOL 500 MG PO TABS: 500.0000 mg | ORAL_TABLET | Freq: Four times a day (QID) | ORAL | Status: DC | PRN

## 2021-06-28 MED ORDER — MENTHOL 3 MG MT LOZG: 1.0000 | LOZENGE | OROMUCOSAL | Status: DC | PRN

## 2021-06-28 MED ORDER — PROPOFOL 10 MG/ML IV BOLUS
INTRAVENOUS | Status: AC
  Filled 2021-06-28: qty 20

## 2021-06-28 MED ORDER — ZOLPIDEM TARTRATE 10 MG PO TABS: 5.0000 mg | ORAL_TABLET | Freq: Every day | ORAL | Status: DC

## 2021-06-28 MED ORDER — DEXMEDETOMIDINE (PRECEDEX) IN NS 20 MCG/5ML (4 MCG/ML) IV SYRINGE
PREFILLED_SYRINGE | INTRAVENOUS | Status: DC | PRN
  Administered 2021-06-28: 4 ug via INTRAVENOUS
  Administered 2021-06-28 (×2): 8 ug via INTRAVENOUS

## 2021-06-28 MED ORDER — DIPHENHYDRAMINE HCL 12.5 MG/5ML PO ELIX: 12.5000 mg | ORAL_SOLUTION | ORAL | Status: DC | PRN

## 2021-06-28 MED ORDER — TRANEXAMIC ACID-NACL 1000-0.7 MG/100ML-% IV SOLN
1000.0000 mg | Freq: Once | INTRAVENOUS | Status: AC
  Administered 2021-06-28: 19:00:00 1000 mg via INTRAVENOUS
  Filled 2021-06-28: qty 100

## 2021-06-28 MED ORDER — SODIUM CHLORIDE 0.9% FLUSH
INTRAVENOUS | Status: DC | PRN
  Administered 2021-06-28: 30 mL

## 2021-06-28 MED ORDER — MIDAZOLAM HCL 2 MG/2ML IJ SOLN
INTRAMUSCULAR | Status: AC
  Administered 2021-06-28: 14:00:00 2 mg
  Filled 2021-06-28: qty 2

## 2021-06-28 MED ORDER — ACETAMINOPHEN 325 MG PO TABS: 325.0000 mg | ORAL_TABLET | Freq: Four times a day (QID) | ORAL | Status: DC | PRN

## 2021-06-28 MED ORDER — HYDROCHLOROTHIAZIDE 12.5 MG PO TABS: 12.5000 mg | ORAL_TABLET | Freq: Every day | ORAL | Status: DC

## 2021-06-28 MED ORDER — BISACODYL 10 MG RE SUPP: 10.0000 mg | Freq: Every day | RECTAL | Status: DC | PRN

## 2021-06-28 MED ORDER — DEXAMETHASONE SODIUM PHOSPHATE 10 MG/ML IJ SOLN
INTRAMUSCULAR | Status: DC | PRN
  Administered 2021-06-28: 4 mg via INTRAVENOUS

## 2021-06-28 MED ORDER — METOCLOPRAMIDE HCL 5 MG/ML IJ SOLN: 5.0000 mg | Freq: Three times a day (TID) | INTRAMUSCULAR | Status: DC | PRN

## 2021-06-28 MED ORDER — ONDANSETRON HCL 4 MG PO TABS: 4.0000 mg | ORAL_TABLET | Freq: Four times a day (QID) | ORAL | Status: DC | PRN

## 2021-06-28 MED ORDER — DEXAMETHASONE SODIUM PHOSPHATE 10 MG/ML IJ SOLN: 8.0000 mg | Freq: Once | INTRAMUSCULAR | Status: DC

## 2021-06-28 MED ORDER — ONDANSETRON HCL 4 MG/2ML IJ SOLN
INTRAMUSCULAR | Status: DC | PRN
  Administered 2021-06-28: 4 mg via INTRAVENOUS

## 2021-06-28 MED ORDER — LIDOCAINE 2% (20 MG/ML) 5 ML SYRINGE
INTRAMUSCULAR | Status: DC | PRN
  Administered 2021-06-28: 100 mg via INTRAVENOUS

## 2021-06-28 MED ORDER — LISINOPRIL-HYDROCHLOROTHIAZIDE 20-12.5 MG PO TABS: 1.0000 | ORAL_TABLET | Freq: Every day | ORAL | Status: DC

## 2021-06-28 MED ORDER — PROPOFOL 10 MG/ML IV BOLUS
INTRAVENOUS | Status: DC | PRN
  Administered 2021-06-28: 200 mg via INTRAVENOUS

## 2021-06-28 SURGICAL SUPPLY — 37 items
ATTUNE MED ANAT PAT 41 KNEE (Knees) ×1 IMPLANT
ATTUNE MED ANAT PAT 41MM KNEE (Knees) ×1 IMPLANT
BAG COUNTER SPONGE SURGICOUNT (BAG) IMPLANT
BAG SURGICOUNT SPONGE COUNTING (BAG)
BIT DRILL 2.8X128 (BIT) ×1 IMPLANT
BIT DRILL 2.8X128MM (BIT) ×1
BLADE SAW SGTL 11.0X1.19X90.0M (BLADE) IMPLANT
BNDG ELASTIC 6X5.8 VLCR STR LF (GAUZE/BANDAGES/DRESSINGS) ×3 IMPLANT
BOWL SMART MIX CTS (DISPOSABLE) ×2 IMPLANT
CEMENT HV SMART SET (Cement) ×2 IMPLANT
COVER SURGICAL LIGHT HANDLE (MISCELLANEOUS) ×3 IMPLANT
DERMABOND ADVANCED (GAUZE/BANDAGES/DRESSINGS) ×2
DERMABOND ADVANCED .7 DNX12 (GAUZE/BANDAGES/DRESSINGS) ×1 IMPLANT
DRAPE INCISE IOBAN 66X45 STRL (DRAPES) ×9 IMPLANT
DRAPE U-SHAPE 47X51 STRL (DRAPES) ×2 IMPLANT
DRESSING AQUACEL AG SP 3.5X10 (GAUZE/BANDAGES/DRESSINGS) IMPLANT
DRSG AQUACEL AG ADV 3.5X14 (GAUZE/BANDAGES/DRESSINGS) ×2 IMPLANT
DRSG AQUACEL AG SP 3.5X10 (GAUZE/BANDAGES/DRESSINGS)
GLOVE SURG ENC MOIS LTX SZ6 (GLOVE) ×3 IMPLANT
GLOVE SURG POLY ORTHO LF SZ8 (GLOVE) ×3 IMPLANT
GLOVE SURG UNDER POLY LF SZ6 (GLOVE) ×3 IMPLANT
GLOVE SURG UNDER POLY LF SZ7.5 (GLOVE) ×6 IMPLANT
GOWN STRL REUS W/ TWL LRG LVL3 (GOWN DISPOSABLE) ×1 IMPLANT
GOWN STRL REUS W/TWL LRG LVL3 (GOWN DISPOSABLE) ×3
HANDPIECE INTERPULSE COAX TIP (DISPOSABLE) ×3
INSERT TIB CRS ATTUNE SZ4 20 (Insert) ×2 IMPLANT
PACK TOTAL KNEE CUSTOM (KITS) ×3 IMPLANT
SET HNDPC FAN SPRY TIP SCT (DISPOSABLE) IMPLANT
SET PAD KNEE POSITIONER (MISCELLANEOUS) ×3 IMPLANT
SUT ETHILON 3 0 PS 1 (SUTURE) ×2 IMPLANT
SUT MNCRL AB 3-0 PS2 18 (SUTURE) ×2 IMPLANT
SUT MNCRL AB 4-0 PS2 18 (SUTURE) ×2 IMPLANT
SUT STRATAFIX 1PDS 45CM VIOLET (SUTURE) ×4 IMPLANT
SUT VIC AB 1 CT1 36 (SUTURE) ×2 IMPLANT
SUT VIC AB 2-0 CT1 27 (SUTURE) ×6
SUT VIC AB 2-0 CT1 TAPERPNT 27 (SUTURE) IMPLANT
WRAP KNEE MAXI GEL POST OP (GAUZE/BANDAGES/DRESSINGS) ×2 IMPLANT

## 2021-06-28 NOTE — Plan of Care (Signed)
  Problem: Education: Goal: Knowledge of General Education information will improve Description: Including pain rating scale, medication(s)/side effects and non-pharmacologic comfort measures Outcome: Progressing   Problem: Nutrition: Goal: Adequate nutrition will be maintained Outcome: Progressing   

## 2021-06-28 NOTE — Discharge Instructions (Signed)

## 2021-06-28 NOTE — Anesthesia Postprocedure Evaluation (Signed)
Anesthesia Post Note  Patient: [name]  Procedure(s) Performed: Left knee open scar debridement versus patella revision (Left: Knee)     Patient location during evaluation: PACU Anesthesia Type: General Level of consciousness: awake and alert Pain management: pain level controlled Vital Signs Assessment: post-procedure vital signs reviewed and stable Respiratory status: spontaneous breathing, nonlabored ventilation and respiratory function stable Cardiovascular status: blood pressure returned to baseline and stable Postop Assessment: no apparent nausea or vomiting Anesthetic complications: no   No notable events documented.  Last Vitals:  Vitals:   06/28/21 1630 06/28/21 1649  BP: 121/71 110/63  Pulse: 72 75  Resp: 14 18  Temp: (!) 36.4 C 36.4 C  SpO2: 99% 93%    Last Pain:  Vitals:   06/28/21 1649  TempSrc: Oral  PainSc: 0-No pain                 Lidia Collum

## 2021-06-28 NOTE — Anesthesia Procedure Notes (Signed)
Anesthesia Regional Block: Adductor canal block   Pre-Anesthetic Checklist: , timeout performed,  Correct Patient, Correct Site, Correct Laterality,  Correct Procedure, Correct Position, site marked,  Risks and benefits discussed,  Surgical consent,  Pre-op evaluation,  At surgeon's request and post-op pain management  Laterality: Left  Prep: chloraprep       Needles:  Injection technique: Single-shot  Needle Type: Echogenic Stimulator Needle     Needle Length: 10cm  Needle Gauge: 20     Additional Needles:   Procedures:,,,, ultrasound used (permanent image in chart),,    Narrative:  Start time: 06/28/2021 1:42 PM End time: 06/28/2021 1:46 PM Injection made incrementally with aspirations every 5 mL.  Performed by: Personally  Anesthesiologist: Lidia Collum, MD  Additional Notes: Standard monitors applied. Skin prepped. Good needle visualization with ultrasound. Injection made in 5cc increments with no resistance to injection. Patient tolerated the procedure well.

## 2021-06-28 NOTE — Progress Notes (Signed)
AssistedDr. Carolyn Witman with left, ultrasound guided, adductor canal block. Side rails up, monitors on throughout procedure. See vital signs in flow sheet. Tolerated Procedure well.  

## 2021-06-28 NOTE — Plan of Care (Signed)
°  Problem: Education: Goal: Knowledge of General Education information will improve Description: Including pain rating scale, medication(s)/side effects and non-pharmacologic comfort measures Outcome: Progressing   Problem: Activity: Goal: Risk for activity intolerance will decrease Outcome: Progressing   Problem: Nutrition: Goal: Adequate nutrition will be maintained Outcome: Progressing   Problem: Elimination: Goal: Will not experience complications related to bowel motility Outcome: Progressing   Problem: Pain Managment: Goal: General experience of comfort will improve Outcome: Progressing   

## 2021-06-28 NOTE — Transfer of Care (Signed)
Immediate Anesthesia Transfer of Care Note  Patient: [name]  Procedure(s) Performed: Left knee open scar debridement versus patella revision (Left: Knee)  Patient Location: PACU  Anesthesia Type:General  Level of Consciousness: awake and alert   Airway & Oxygen Therapy: Patient Spontanous Breathing and Patient connected to face mask oxygen  Post-op Assessment: Report given to RN and Post -op Vital signs reviewed and stable  Post vital signs: Reviewed and stable  Last Vitals:  Vitals Value Taken Time  BP 126/71 06/28/21 1546  Temp    Pulse 81 06/28/21 1548  Resp 20 06/28/21 1548  SpO2 100 % 06/28/21 1548  Vitals shown include unvalidated device data.  Last Pain:  Vitals:   06/28/21 1101  TempSrc:   PainSc: 3       Patients Stated Pain Goal: 3 (80/32/12 2482)  Complications: No notable events documented.

## 2021-06-28 NOTE — Anesthesia Preprocedure Evaluation (Signed)
Anesthesia Evaluation  Patient identified by MRN, date of birth, ID band Patient awake    Reviewed: Allergy & Precautions, NPO status , Patient's Chart, lab work & pertinent test results  History of Anesthesia Complications Negative for: history of anesthetic complications  Airway Mallampati: II  TM Distance: >3 FB Neck ROM: Full    Dental  (+) Dental Advisory Given, Teeth Intact   Pulmonary neg pulmonary ROS,    Pulmonary exam normal        Cardiovascular hypertension, Normal cardiovascular exam     Neuro/Psych negative neurological ROS     GI/Hepatic Neg liver ROS, GERD  ,  Endo/Other  diabetes, Type 2Morbid obesity  Renal/GU negative Renal ROS  negative genitourinary   Musculoskeletal  (+) Arthritis ,   Abdominal   Peds  Hematology negative hematology ROS (+)   Anesthesia Other Findings   Reproductive/Obstetrics                            Anesthesia Physical Anesthesia Plan  ASA: 2  Anesthesia Plan: General   Post-op Pain Management: Regional block   Induction: Intravenous  PONV Risk Score and Plan: 3 and Ondansetron, Dexamethasone, Midazolam and Treatment may vary due to age or medical condition  Airway Management Planned: LMA  Additional Equipment: None  Intra-op Plan:   Post-operative Plan: Extubation in OR  Informed Consent: I have reviewed the patients History and Physical, chart, labs and discussed the procedure including the risks, benefits and alternatives for the proposed anesthesia with the patient or authorized representative who has indicated his/her understanding and acceptance.     Dental advisory given  Plan Discussed with:   Anesthesia Plan Comments:         Anesthesia Quick Evaluation

## 2021-06-28 NOTE — Interval H&P Note (Signed)
History and Physical Interval Note:  06/28/2021 12:12 PM  [name]  has presented today for surgery, with the diagnosis of History of left total knee revision with painful mechanical symptoms.  The various methods of treatment have been discussed with the patient and family. After consideration of risks, benefits and other options for treatment, the patient has consented to  Procedure(s): Left knee open scar debridement versus patella revision (Left) as a surgical intervention.  The patient's history has been reviewed, patient examined, no change in status, stable for surgery.  I have reviewed the patient's chart and labs.  Questions were answered to the patient's satisfaction.     Mauri Pole

## 2021-06-28 NOTE — Anesthesia Procedure Notes (Signed)
Procedure Name: LMA Insertion Date/Time: 06/28/2021 2:02 PM Performed by: Lavina Hamman, CRNA Pre-anesthesia Checklist: Patient identified, Emergency Drugs available, Suction available and Patient being monitored Patient Re-evaluated:Patient Re-evaluated prior to induction Oxygen Delivery Method: Circle System Utilized Preoxygenation: Pre-oxygenation with 100% oxygen Induction Type: IV induction Ventilation: Mask ventilation without difficulty LMA: LMA inserted LMA Size: 4.0 Number of attempts: 1 Airway Equipment and Method: Bite block Placement Confirmation: positive ETCO2 Tube secured with: Tape Dental Injury: Teeth and Oropharynx as per pre-operative assessment

## 2021-06-28 NOTE — Brief Op Note (Signed)
06/28/2021  3:10 PM  PATIENT:  [name]  70 y.o. female  PRE-OPERATIVE DIAGNOSIS:  History of left total knee revision with painful mechanical symptoms  POST-OPERATIVE DIAGNOSIS:  History of left total knee revision with painful mechanical symptoms  PROCEDURE:  Procedure(s): Left knee open scar debridement versus patella revision (Left)  SURGEON:  Surgeon(s) and Role:    Paralee Cancel, MD - Primary  PHYSICIAN ASSISTANT: Costella Hatcher, PA-C  ANESTHESIA:   regional and general  EBL:  150 mL   BLOOD ADMINISTERED:none  DRAINS: none   LOCAL MEDICATIONS USED:  MARCAINE     SPECIMEN:  No Specimen  DISPOSITION OF SPECIMEN:  N/A  COUNTS:  YES  TOURNIQUET:   Total Tourniquet Time Documented: Thigh (Left) - 28 minutes Total: Thigh (Left) - 28 minutes   DICTATION: .Other Dictation: Dictation Number [id number]  PLAN OF CARE: Admit for overnight observation  PATIENT DISPOSITION:  PACU - hemodynamically stable.   Delay start of Pharmacological VTE agent (>24hrs) due to surgical blood loss or risk of bleeding: no

## 2021-06-29 ENCOUNTER — Encounter (HOSPITAL_COMMUNITY): Payer: Self-pay | Admitting: Orthopedic Surgery

## 2021-06-29 DIAGNOSIS — Z833 Family history of diabetes mellitus: Secondary | ICD-10-CM | POA: Diagnosis not present

## 2021-06-29 DIAGNOSIS — E119 Type 2 diabetes mellitus without complications: Secondary | ICD-10-CM | POA: Diagnosis not present

## 2021-06-29 DIAGNOSIS — T84093A Other mechanical complication of internal left knee prosthesis, initial encounter: Secondary | ICD-10-CM | POA: Diagnosis not present

## 2021-06-29 DIAGNOSIS — I10 Essential (primary) hypertension: Secondary | ICD-10-CM | POA: Diagnosis not present

## 2021-06-29 DIAGNOSIS — Z96612 Presence of left artificial shoulder joint: Secondary | ICD-10-CM | POA: Diagnosis present

## 2021-06-29 DIAGNOSIS — Z79899 Other long term (current) drug therapy: Secondary | ICD-10-CM | POA: Diagnosis not present

## 2021-06-29 DIAGNOSIS — Z6841 Body Mass Index (BMI) 40.0 and over, adult: Secondary | ICD-10-CM | POA: Diagnosis not present

## 2021-06-29 DIAGNOSIS — Z832 Family history of diseases of the blood and blood-forming organs and certain disorders involving the immune mechanism: Secondary | ICD-10-CM | POA: Diagnosis not present

## 2021-06-29 DIAGNOSIS — Y793 Surgical instruments, materials and orthopedic devices (including sutures) associated with adverse incidents: Secondary | ICD-10-CM | POA: Diagnosis present

## 2021-06-29 DIAGNOSIS — Z811 Family history of alcohol abuse and dependence: Secondary | ICD-10-CM | POA: Diagnosis not present

## 2021-06-29 DIAGNOSIS — Z96643 Presence of artificial hip joint, bilateral: Secondary | ICD-10-CM | POA: Diagnosis present

## 2021-06-29 DIAGNOSIS — E785 Hyperlipidemia, unspecified: Secondary | ICD-10-CM | POA: Diagnosis not present

## 2021-06-29 DIAGNOSIS — Z8249 Family history of ischemic heart disease and other diseases of the circulatory system: Secondary | ICD-10-CM | POA: Diagnosis not present

## 2021-06-29 DIAGNOSIS — Z8371 Family history of colonic polyps: Secondary | ICD-10-CM | POA: Diagnosis not present

## 2021-06-29 DIAGNOSIS — E559 Vitamin D deficiency, unspecified: Secondary | ICD-10-CM | POA: Diagnosis present

## 2021-06-29 DIAGNOSIS — Z96651 Presence of right artificial knee joint: Secondary | ICD-10-CM | POA: Diagnosis not present

## 2021-06-29 LAB — BASIC METABOLIC PANEL
Anion gap: 8 (ref 5–15)
BUN: 13 mg/dL (ref 8–23)
CO2: 26 mmol/L (ref 22–32)
Calcium: 8.8 mg/dL — ABNORMAL LOW (ref 8.9–10.3)
Chloride: 102 mmol/L (ref 98–111)
Creatinine, Ser: 0.79 mg/dL (ref 0.44–1.00)
GFR, Estimated: >60 mL/min (ref >60)
Glucose, Bld: 130 mg/dL — ABNORMAL HIGH (ref 70–99)
Potassium: 4.3 mmol/L (ref 3.5–5.1)
Sodium: 136 mmol/L (ref 135–145)

## 2021-06-29 LAB — CBC
HCT: 30.1 % — ABNORMAL LOW (ref 36.0–46.0)
Hemoglobin: 9.5 g/dL — ABNORMAL LOW (ref 12.0–15.0)
MCH: 24.4 pg — ABNORMAL LOW (ref 26.0–34.0)
MCHC: 31.6 g/dL (ref 30.0–36.0)
MCV: 77.4 fL — ABNORMAL LOW (ref 80.0–100.0)
Platelets: 184 10*3/uL (ref 150–400)
RBC: 3.89 MIL/uL (ref 3.87–5.11)
RDW: 16.8 % — ABNORMAL HIGH (ref 11.5–15.5)
WBC: 6.6 10*3/uL (ref 4.0–10.5)
nRBC: 0 % (ref 0.0–0.2)

## 2021-06-29 MED ORDER — METHOCARBAMOL 500 MG PO TABS: 500.0000 mg | ORAL_TABLET | Freq: Four times a day (QID) | ORAL | 0 refills | Status: DC | PRN

## 2021-06-29 MED ORDER — OXYCODONE HCL 5 MG PO TABS: 5.0000 mg | ORAL_TABLET | Freq: Four times a day (QID) | ORAL | 0 refills | Status: DC | PRN

## 2021-06-29 MED ORDER — TRANEXAMIC ACID 650 MG PO TABS (ORTHO)
1950.0000 mg | ORAL_TABLET | Freq: Once | ORAL | Status: AC
  Administered 2021-06-29: 10:00:00 1950 mg via ORAL
  Filled 2021-06-29: qty 3

## 2021-06-29 MED ORDER — ASPIRIN 81 MG PO CHEW: 81.0000 mg | CHEWABLE_TABLET | Freq: Two times a day (BID) | ORAL | 0 refills | Status: AC

## 2021-06-29 MED ORDER — CELECOXIB 200 MG PO CAPS: 200.0000 mg | ORAL_CAPSULE | Freq: Two times a day (BID) | ORAL | 0 refills | Status: DC

## 2021-06-29 NOTE — Op Note (Signed)
NAMEEVALEIGH, [name] MEDICAL RECORD NO: [id number] ACCOUNT NO: [id number] DATE OF BIRTH: June 13, 1951 FACILITY: [facility] LOCATION: [id number] PHYSICIAN: Pietro Cassis. Alvan Dame, MD  Operative Report   DATE OF PROCEDURE: 06/28/2021  PREOPERATIVE DIAGNOSIS:  Status post left total knee revision with persistent mechanical symptoms aggravating activities.  POSTOPERATIVE DIAGNOSIS/FINDINGS:  Status post left total knee revision with persistent mechanical symptoms aggravating activities.  PROCEDURE:  Revision left total knee arthroplasty involving a polyethylene insert, upsizing her polyethylene from a 4 x 14 mm to 4 x 20 mm posterior stabilized revision insert and a 41 patellar button.  SURGEON:  Pietro Cassis. Alvan Dame, MD.  ASSISTANT:  Costella Hatcher, PA-C.  Note that [name] was present for the entirety of the case from preoperative positioning, perioperative management of the operative extremity, general facilitation of the case, primary wound closure.  ANESTHESIA:  Regional plus general anesthetic.  SPECIMENS:  None.  COMPLICATIONS:  None.  BLOOD LOSS:  Less than 100 mL  TOURNIQUET TIME: Tourniquet was up for 30 minutes at 250 mmHg.  DRAINS:  None.  INDICATIONS FOR PROCEDURE:  The patient is a very pleasant 70 year old female with history of recent left knee revision surgery in 03/2021.  During the postoperative course, she noted a popping type sensation over the anterior lateral aspect of the knee.   I was uncertain of this etiology.  Most recent radiographs from their office visit 2 weeks ago did not indicate any obvious complications or failure of her components.  A sunrise view had indicated that her patella was tracking into the trochlea  without evidence of obvious patellar complication; however, this was a location where most of her symptoms were.  Given the persistence of the symptoms and the aggravations associated, she wished to proceed with revision surgery.  I discussed with her  the risks  and the challenges of proceeding in this fashion. Without an obvious source, I would be unable to provide substantial prediction on whether or not this will provide benefit.  Nonetheless, she wished to proceed.  Standard risks of infection,  DVT, component failure, need for future surgeries were reviewed.  The postoperative course was reviewed and in hopes that this would have significant improvement in her symptoms.  PROCEDURE IN DETAIL:  The patient was brought to the operative theater.  Once adequate anesthesia, preoperative antibiotics, Ancef administered, she was positioned supine with a left thigh tourniquet placed.  The left lower extremity was prepped and  draped in sterile fashion.  A time-out was performed identifying the patient, planned procedure, and extremity.  The leg was exsanguinated, tourniquet elevated to 250 mmHg.  Her old incision was excised.  Soft tissue planes were then created.  A medial  arthrotomy was made, encountering a blood-tinged synovial fluid without signs of infection.  Following initial exposure and synovectomy, we did identify that her patella had not failed and the patellar button was then placed; however, this was not  revised at her last procedure.  The vast majority of the case was carried out for debridement and re-creating the medial gutter and suprapatellar pouch, but I did focus laterally due to this sensation of mechanical catching that she was having.  I also  evaluated and determined that she had some hyperextension.  I thus removed her old polyethylene and upsized to a size 20 to provide full knee extension, but yet balanced in flexion.  There was a time that I did range her knee with her patella located to  the trochlea and I did  identify that there was a catching, but it did not appear to be coming from the patella, but rather it was soft tissues laterally.  For that reason, I focused further debridement on the lateral scar and soft tissues in the   parameniscal region.  Once I did this, I was unable to identify any catching.  Again, I did revise her patella, removed the old patellar button.  We cleared off that cut surface of the patella and drilled small holes into the patella for further cement  interdigitation.  I elected to go with a 41 patellar button to help to elevate and separate the patella and soft tissues from the component.  The new patella tracked through the trochlea without application of pressure.  Once this was done, the cement was opened and the final components were opened.  The final polyethylene insert was placed and the final patellar button was cemented into place.  Once the cement had fully cured, there was no cement that needed to be  removed.  We irrigated the knee with normal saline solution.  The extensor mechanism was then reapproximated using #1 Vicryl and #1 Stratafix sutures.  The remainder of the wound was closed in layers with 2-0 Vicryl and a running Monocryl.  The knee was  cleaned, dried and dressed sterilely using surgical glue and Aquacel dressing.  She was then brought to the recovery room in stable condition, tolerated the procedure well with a bulky Jones.  Findings were reviewed with the family.      PAA D: 06/28/2021 5:59:24 pm T: 06/29/2021 1:24:00 am  JOB: [id number]/ [id number]

## 2021-06-29 NOTE — Progress Notes (Signed)
° °  Subjective: 1 Day Post-Op Procedure(s) (LRB): Left knee open scar debridement versus patella revision (Left) Patient reports pain as mild.   Patient seen in rounds by Dr. Alvan Dame. Patient is well, and has had no acute complaints or problems. No acute events overnight. Foley catheter removed. Patient reports the mechanical sensation she felt pre-op is resolved. We will start therapy today.   Objective: Vital signs in last 24 hours: Temp:  [96.4 F (35.8 C)-98.6 F (37 C)] 97.5 F (36.4 C) (12/21 0527) Pulse Rate:  [57-78] 57 (12/21 0527) Resp:  [10-18] 16 (12/21 0527) BP: (98-126)/(54-77) 98/62 (12/21 0527) SpO2:  [93 %-100 %] 100 % (12/21 0527) Weight:  [99.7 kg] 99.7 kg (12/20 1101)  Intake/Output from previous day:  Intake/Output Summary (Last 24 hours) at 06/29/2021 0720 Last data filed at 06/29/2021 0600 Gross per 24 hour  Intake 3608.12 ml  Output 1250 ml  Net 2358.12 ml     Intake/Output this shift: No intake/output data recorded.  Labs: Recent Labs    06/29/21 0326  HGB 9.5*   Recent Labs    06/29/21 0326  WBC 6.6  RBC 3.89  HCT 30.1*  PLT 184   Recent Labs    06/29/21 0326  NA 136  K 4.3  CL 102  CO2 26  BUN 13  CREATININE 0.79  GLUCOSE 130*  CALCIUM 8.8*   No results for input(s): LABPT, INR in the last 72 hours.  Exam: General - Patient is Alert and Oriented Extremity - Neurologically intact Sensation intact distally Intact pulses distally Dorsiflexion/Plantar flexion intact Dressing - dressing C/D/I Motor Function - intact, moving foot and toes well on exam.   Past Medical History:  Diagnosis Date   Anxiety    Constipation    Essential hypertension    GERD (gastroesophageal reflux disease)    Heart murmur    Hyperlipidemia    Insomnia    Obesity    Osteoarthritis    Superficial fungus infection of skin 11/25/2015   Type 2 diabetes mellitus (HCC)    Urge incontinence 11/04/2014   Vertigo    Vitamin D deficiency      Assessment/Plan: 1 Day Post-Op Procedure(s) (LRB): Left knee open scar debridement versus patella revision (Left) Principal Problem:   S/P revision of total knee, left  Estimated body mass index is 41.53 kg/m as calculated from the following:   Height as of this encounter: 5\' 1"  (1.549 m).   Weight as of this encounter: 99.7 kg. Advance diet Up with therapy D/C IV fluids   DVT Prophylaxis - Aspirin Weight bearing as tolerated.  Plan is to go Home after hospital stay. Plan for discharge today following 1-2 sessions of PT as long as they are meeting their goals. Patient is scheduled for OPPT. Follow up in the office in 2 weeks.   Griffith Citron, PA-C Orthopedic Surgery [phone] 06/29/2021, 7:20 AM

## 2021-06-29 NOTE — Evaluation (Signed)
Physical Therapy Evaluation Patient Details Name: [name] MRN: [id number] DOB: 10/26/1950 Today's Date: 06/29/2021  History of Present Illness  69 y.o. admitted 06/28/21 for L TK poly exchange. PMH includes TK revision 03/22/21, shoulder replacement, R THA revision, DM, vertigo.  Clinical Impression  Pt is mobilizing well, she ambulated 200' with a RW, no loss of balance. She demonstrates good understanding of HEP.  Pt reported some clunking of L knee during knee flexion/extension AROM, this was palpable. However she stated there was no clunking with ambulation. From a mobility standpoint, she is ready to DC home.        Recommendations for follow up therapy are one component of a multi-disciplinary discharge planning process, led by the attending physician.  Recommendations may be updated based on patient status, additional functional criteria and insurance authorization.  Follow Up Recommendations Outpatient PT    Assistance Recommended at Discharge Set up Supervision/Assistance  Functional Status Assessment Patient has had a recent decline in their functional status and demonstrates the ability to make significant improvements in function in a reasonable and predictable amount of time.  Equipment Recommendations  None recommended by PT    Recommendations for Other Services       Precautions / Restrictions Precautions Precautions: Knee;Fall Precaution Booklet Issued: Yes (comment) Precaution Comments: reviewed no pillow under knee Restrictions Weight Bearing Restrictions: No      Mobility  Bed Mobility Overal bed mobility: Independent                  Transfers Overall transfer level: Needs assistance Equipment used: Rolling walker (2 wheels) Transfers: Sit to/from Stand Sit to Stand: Supervision           General transfer comment: VCs hand placement    Ambulation/Gait Ambulation/Gait assistance: Modified independent (Device/Increase time) Gait  Distance (Feet): 250 Feet Assistive device: Rolling walker (2 wheels) Gait Pattern/deviations: Step-to pattern Gait velocity: WFL     General Gait Details: good sequencing, steady, no loss of balance, pt reports no clunking of knee while ambulating  Stairs            Wheelchair Mobility    Modified Rankin (Stroke Patients Only)       Balance Overall balance assessment: Modified Independent                                           Pertinent Vitals/Pain Pain Assessment: 0-10 Pain Score: 3  Pain Location: L knee Pain Descriptors / Indicators: Discomfort Pain Intervention(s): Limited activity within patient's tolerance;Monitored during session;Ice applied;RN gave pain meds during session;Patient requesting pain meds-RN notified    Home Living Family/patient expects to be discharged to:: Private residence Living Arrangements: Alone Available Help at Discharge: Family;Available 24 hours/day Type of Home: House Home Access: Stairs to enter Entrance Stairs-Rails: Can reach both;Left;Right Entrance Stairs-Number of Steps: 1   Home Layout: One level Home Equipment: Conservation officer, nature (2 wheels);BSC/3in1;Cane - single point Additional Comments: walking without AD prior to admission    Prior Function Prior Level of Function : Independent/Modified Independent                     Hand Dominance        Extremity/Trunk Assessment   Upper Extremity Assessment Upper Extremity Assessment: Overall WFL for tasks assessed    Lower Extremity Assessment Lower Extremity Assessment: LLE deficits/detail LLE Deficits /  Details: 0-110* L knee, + SLR, knee ext +4/5; clunking noted with knee flexion/extension AROM LLE Sensation: WNL LLE Coordination: WNL    Cervical / Trunk Assessment Cervical / Trunk Assessment: Normal  Communication   Communication: No difficulties  Cognition Arousal/Alertness: Awake/alert Behavior During Therapy: WFL for tasks  assessed/performed Overall Cognitive Status: Within Functional Limits for tasks assessed                                          General Comments      Exercises Total Joint Exercises Ankle Circles/Pumps: AROM;Both;10 reps;Supine Quad Sets: AROM;Both;5 reps;Supine Short Arc Quad: AROM;Left;Supine;10 reps Heel Slides: AAROM;Left;10 reps;Supine Hip ABduction/ADduction: AAROM;Left;10 reps;Supine Straight Leg Raises: AROM;Left;5 reps;Supine Long Arc Quad: AROM;Left;5 reps;Seated Knee Flexion: AAROM;Left;10 reps;Seated Goniometric ROM: 0-110* AAROM L knee   Assessment/Plan    PT Assessment All further PT needs can be met in the next venue of care  PT Problem List Decreased activity tolerance;Pain       PT Treatment Interventions      PT Goals (Current goals can be found in the Care Plan section)  Acute Rehab PT Goals Patient Stated Goal: decrease pain/clunking of knee PT Goal Formulation: All assessment and education complete, DC therapy    Frequency     Barriers to discharge        Co-evaluation               AM-PAC PT "6 Clicks" Mobility  Outcome Measure Help needed turning from your back to your side while in a flat bed without using bedrails?: None Help needed moving from lying on your back to sitting on the side of a flat bed without using bedrails?: None Help needed moving to and from a bed to a chair (including a wheelchair)?: None Help needed standing up from a chair using your arms (e.g., wheelchair or bedside chair)?: None Help needed to walk in hospital room?: None Help needed climbing 3-5 steps with a railing? : A Little 6 Click Score: 23    End of Session Equipment Utilized During Treatment: Gait belt Activity Tolerance: Patient tolerated treatment well Patient left: in chair;with call bell/phone within reach Nurse Communication: Mobility status PT Visit Diagnosis: Pain Pain - Right/Left: Left Pain - part of body: Knee     Time: 3545-6256 PT Time Calculation (min) (ACUTE ONLY): 36 min   Charges:   PT Evaluation $PT Eval Moderate Complexity: 1 Mod PT Treatments $Gait Training: 8-22 mins        Blondell Reveal Kistler PT 06/29/2021  Acute Rehabilitation Services Pager [phone] Office [phone]

## 2021-06-29 NOTE — TOC Transition Note (Signed)
Transition of Care [facility]) - CM/SW Discharge Note   Patient Details  Name: [name] MRN: [id number] Date of Birth: Jul 04, 1951  [facility] CM/SW Contact:  Lennart Pall, LCSW Phone Number: 06/29/2021, 10:41 AM   Clinical Narrative:    Met with pt and confirming she has all needed DME at home.  Planning for OPPT at [facility].  No TOC needs.   Final next level of care: OP Rehab Barriers to Discharge: No Barriers Identified   Patient Goals and CMS Choice Patient states their goals for this hospitalization and ongoing recovery are:: return home      Discharge Placement                       Discharge Plan and Services                DME Arranged: N/A DME Agency: NA                  Social Determinants of Health (SDOH) Interventions     Readmission Risk Interventions No flowsheet data found.

## 2021-06-29 NOTE — Plan of Care (Signed)
°  Problem: Education: Goal: Knowledge of General Education information will improve Description: Including pain rating scale, medication(s)/side effects and non-pharmacologic comfort measures Outcome: Adequate for Discharge   Problem: Health Behavior/Discharge Planning: Goal: Ability to manage health-related needs will improve Outcome: Adequate for Discharge   Problem: Clinical Measurements: Goal: Ability to maintain clinical measurements within normal limits will improve Outcome: Adequate for Discharge Goal: Will remain free from infection Outcome: Adequate for Discharge Goal: Diagnostic test results will improve Outcome: Adequate for Discharge Goal: Respiratory complications will improve Outcome: Adequate for Discharge Goal: Cardiovascular complication will be avoided Outcome: Adequate for Discharge   Problem: Activity: Goal: Risk for activity intolerance will decrease Outcome: Adequate for Discharge   Problem: Nutrition: Goal: Adequate nutrition will be maintained Outcome: Adequate for Discharge   Problem: Coping: Goal: Level of anxiety will decrease Outcome: Adequate for Discharge   Problem: Elimination: Goal: Will not experience complications related to bowel motility Outcome: Adequate for Discharge Goal: Will not experience complications related to urinary retention Outcome: Adequate for Discharge   Problem: Pain Managment: Goal: General experience of comfort will improve Outcome: Adequate for Discharge   Problem: Safety: Goal: Ability to remain free from injury will improve Outcome: Adequate for Discharge   Problem: Education: Goal: Required Educational Video(s) Outcome: Adequate for Discharge   Problem: Clinical Measurements: Goal: Postoperative complications will be avoided or minimized Outcome: Adequate for Discharge   Problem: Skin Integrity: Goal: Demonstration of wound healing without infection will improve Outcome: Adequate for Discharge

## 2021-06-30 DIAGNOSIS — M25662 Stiffness of left knee, not elsewhere classified: Secondary | ICD-10-CM | POA: Diagnosis not present

## 2021-06-30 DIAGNOSIS — M25562 Pain in left knee: Secondary | ICD-10-CM | POA: Diagnosis not present

## 2021-07-01 NOTE — Discharge Summary (Addendum)
Patient ID: SKIE VITRANO MRN: [id number] DOB/AGE: 12/08/1950 70 y.o.  Admit date: 06/28/2021 Discharge date: 06/29/21  Admission Diagnoses:  Status post left total knee revision with persistent mechanical symptoms aggravating activities.   Discharge Diagnoses:  Same  Past Medical History:  Diagnosis Date   Anxiety    Constipation    Essential hypertension    GERD (gastroesophageal reflux disease)    Heart murmur    Hyperlipidemia    Insomnia    Obesity    Osteoarthritis    Superficial fungus infection of skin 11/25/2015   Type 2 diabetes mellitus (HCC)    Urge incontinence 11/04/2014   Vertigo    Vitamin D deficiency     Surgeries: Procedure(s): Left knee open scar debridement versus patella revision on 06/28/2021   Consultants:   Discharged Condition: Improved  Hospital Course: SHERIDEN ARCHIBEQUE is an 70 y.o. female who was admitted 06/28/2021 for operative treatment ofS/P revision of total knee, left. Patient has severe unremitting pain that affects sleep, daily activities, and work/hobbies. After pre-op clearance the patient was taken to the operating room on 06/28/2021 and underwent  Procedure(s): Left knee open scar debridement versus patella revision.    Patient was given perioperative antibiotics:  Anti-infectives (From admission, onward)    Start     Dose/Rate Route Frequency Ordered Stop   06/28/21 2200  hydroxychloroquine (PLAQUENIL) tablet 400 mg  Status:  Discontinued        400 mg Oral Daily 06/28/21 1647 06/29/21 1800   06/28/21 2000  ceFAZolin (ANCEF) IVPB 2g/100 mL premix        2 g 200 mL/hr over 30 Minutes Intravenous Every 6 hours 06/28/21 1647 06/29/21 0206   06/28/21 1100  ceFAZolin (ANCEF) IVPB 2g/100 mL premix        2 g 200 mL/hr over 30 Minutes Intravenous On call to O.R. 06/28/21 1048 06/28/21 1359        Patient was given sequential compression devices, early ambulation, and chemoprophylaxis to prevent DVT. Patient worked with PT and  was meeting their goals regarding safe ambulation and transfers.  Patient benefited maximally from hospital stay and there were no complications.    Recent vital signs: No data found.   Recent laboratory studies:  Recent Labs    06/29/21 0326  WBC 6.6  HGB 9.5*  HCT 30.1*  PLT 184  NA 136  K 4.3  CL 102  CO2 26  BUN 13  CREATININE 0.79  GLUCOSE 130*  CALCIUM 8.8*     Discharge Medications:   Allergies as of 06/29/2021   No Known Allergies      Medication List     TAKE these medications    aspirin 81 MG chewable tablet Chew 1 tablet (81 mg total) by mouth 2 (two) times daily for 28 days.   celecoxib 200 MG capsule Commonly known as: CELEBREX Take 1 capsule (200 mg total) by mouth 2 (two) times daily.   cholecalciferol 25 MCG (1000 UNIT) tablet Commonly known as: VITAMIN D Take 1,000 Units by mouth daily.   docusate sodium 100 MG capsule Commonly known as: COLACE Take 1 capsule (100 mg total) by mouth 2 (two) times daily. What changed:  how much to take when to take this   Flaxseed Oil 1000 MG Caps Take 1,000 mg by mouth daily.   hydroxychloroquine 200 MG tablet Commonly known as: PLAQUENIL Take 400 mg by mouth daily.   lisinopril-hydrochlorothiazide 20-12.5 MG tablet Commonly known as: ZESTORETIC Take 1  tablet by mouth daily before breakfast.   methocarbamol 500 MG tablet Commonly known as: ROBAXIN Take 1 tablet (500 mg total) by mouth every 6 (six) hours as needed for muscle spasms.   Omega-3 1000 MG Caps Take 1,000 mg by mouth daily.   oxyCODONE 5 MG immediate release tablet Commonly known as: Oxy IR/ROXICODONE Take 1-2 tablets (5-10 mg total) by mouth every 6 (six) hours as needed for severe pain.   polyethylene glycol powder 17 GM/SCOOP powder Commonly known as: GLYCOLAX/MIRALAX Take 17 g by mouth daily as needed for moderate constipation.   zolpidem 10 MG tablet Commonly known as: AMBIEN Take 10 mg by mouth at bedtime.                Discharge Care Instructions  (From admission, onward)           Start     Ordered   06/29/21 0000  Change dressing       Comments: Maintain surgical dressing until follow up in the clinic. If the edges start to pull up, may reinforce with tape. If the dressing is no longer working, may remove and cover with gauze and tape, but must keep the area dry and clean.  Call with any questions or concerns.   06/29/21 0723            Diagnostic Studies: No results found.  Disposition: Discharge disposition: 01-Home or Self Care       Discharge Instructions     Call MD / Call 911   Complete by: As directed    If you experience chest pain or shortness of breath, CALL 911 and be transported to the hospital emergency room.  If you develope a fever above 101 F, pus (white drainage) or increased drainage or redness at the wound, or calf pain, call your surgeon's office.   Change dressing   Complete by: As directed    Maintain surgical dressing until follow up in the clinic. If the edges start to pull up, may reinforce with tape. If the dressing is no longer working, may remove and cover with gauze and tape, but must keep the area dry and clean.  Call with any questions or concerns.   Constipation Prevention   Complete by: As directed    Drink plenty of fluids.  Prune juice may be helpful.  You may use a stool softener, such as Colace (over the counter) 100 mg twice a day.  Use MiraLax (over the counter) for constipation as needed.   Diet - low sodium heart healthy   Complete by: As directed    Increase activity slowly as tolerated   Complete by: As directed    Weight bearing as tolerated with assist device (walker, cane, etc) as directed, use it as long as suggested by your surgeon or therapist, typically at least 4-6 weeks.   Post-operative opioid taper instructions:   Complete by: As directed    POST-OPERATIVE OPIOID TAPER INSTRUCTIONS: It is important to wean off of your  opioid medication as soon as possible. If you do not need pain medication after your surgery it is ok to stop day one. Opioids include: Codeine, Hydrocodone(Norco, Vicodin), Oxycodone(Percocet, oxycontin) and hydromorphone amongst others.  Long term and even short term use of opiods can cause: Increased pain response Dependence Constipation Depression Respiratory depression And more.  Withdrawal symptoms can include Flu like symptoms Nausea, vomiting And more Techniques to manage these symptoms Hydrate well Eat regular healthy meals Stay active Use  relaxation techniques(deep breathing, meditating, yoga) Do Not substitute Alcohol to help with tapering If you have been on opioids for less than two weeks and do not have pain than it is ok to stop all together.  Plan to wean off of opioids This plan should start within one week post op of your joint replacement. Maintain the same interval or time between taking each dose and first decrease the dose.  Cut the total daily intake of opioids by one tablet each day Next start to increase the time between doses. The last dose that should be eliminated is the evening dose.      TED hose   Complete by: As directed    Use stockings (TED hose) for 2 weeks on both leg(s).  You may remove them at night for sleeping.        Follow-up Information     Paralee Cancel, MD. Schedule an appointment as soon as possible for a visit in 2 week(s).   Specialty: Orthopedic Surgery Contact information: [address] [place] [address] [phone]                  Signed: Irving Copas 07/01/2021, 9:41 AM

## 2021-07-06 DIAGNOSIS — M25562 Pain in left knee: Secondary | ICD-10-CM | POA: Diagnosis not present

## 2021-07-06 DIAGNOSIS — M25662 Stiffness of left knee, not elsewhere classified: Secondary | ICD-10-CM | POA: Diagnosis not present

## 2021-07-13 DIAGNOSIS — M25562 Pain in left knee: Secondary | ICD-10-CM | POA: Diagnosis not present

## 2021-07-13 DIAGNOSIS — M25662 Stiffness of left knee, not elsewhere classified: Secondary | ICD-10-CM | POA: Diagnosis not present

## 2021-07-15 DIAGNOSIS — M25662 Stiffness of left knee, not elsewhere classified: Secondary | ICD-10-CM | POA: Diagnosis not present

## 2021-07-15 DIAGNOSIS — M25562 Pain in left knee: Secondary | ICD-10-CM | POA: Diagnosis not present

## 2021-07-19 DIAGNOSIS — M25662 Stiffness of left knee, not elsewhere classified: Secondary | ICD-10-CM | POA: Diagnosis not present

## 2021-07-19 DIAGNOSIS — M25562 Pain in left knee: Secondary | ICD-10-CM | POA: Diagnosis not present

## 2021-07-21 DIAGNOSIS — M25562 Pain in left knee: Secondary | ICD-10-CM | POA: Diagnosis not present

## 2021-07-21 DIAGNOSIS — M25662 Stiffness of left knee, not elsewhere classified: Secondary | ICD-10-CM | POA: Diagnosis not present

## 2021-07-26 DIAGNOSIS — M25562 Pain in left knee: Secondary | ICD-10-CM | POA: Diagnosis not present

## 2021-07-26 DIAGNOSIS — M25662 Stiffness of left knee, not elsewhere classified: Secondary | ICD-10-CM | POA: Diagnosis not present

## 2021-07-28 DIAGNOSIS — M25662 Stiffness of left knee, not elsewhere classified: Secondary | ICD-10-CM | POA: Diagnosis not present

## 2021-07-28 DIAGNOSIS — M25562 Pain in left knee: Secondary | ICD-10-CM | POA: Diagnosis not present

## 2021-08-01 DIAGNOSIS — M25662 Stiffness of left knee, not elsewhere classified: Secondary | ICD-10-CM | POA: Diagnosis not present

## 2021-08-01 DIAGNOSIS — M25562 Pain in left knee: Secondary | ICD-10-CM | POA: Diagnosis not present

## 2021-08-03 DIAGNOSIS — M25562 Pain in left knee: Secondary | ICD-10-CM | POA: Diagnosis not present

## 2021-08-03 DIAGNOSIS — M25662 Stiffness of left knee, not elsewhere classified: Secondary | ICD-10-CM | POA: Diagnosis not present

## 2021-08-08 DIAGNOSIS — M25662 Stiffness of left knee, not elsewhere classified: Secondary | ICD-10-CM | POA: Diagnosis not present

## 2021-08-08 DIAGNOSIS — M25562 Pain in left knee: Secondary | ICD-10-CM | POA: Diagnosis not present

## 2021-08-10 DIAGNOSIS — M25562 Pain in left knee: Secondary | ICD-10-CM | POA: Diagnosis not present

## 2021-08-10 DIAGNOSIS — M25662 Stiffness of left knee, not elsewhere classified: Secondary | ICD-10-CM | POA: Diagnosis not present

## 2021-08-12 DIAGNOSIS — Z4789 Encounter for other orthopedic aftercare: Secondary | ICD-10-CM | POA: Diagnosis not present

## 2021-08-15 ENCOUNTER — Other Ambulatory Visit: Payer: Medicare Other | Admitting: Adult Health

## 2021-08-15 DIAGNOSIS — M25562 Pain in left knee: Secondary | ICD-10-CM | POA: Diagnosis not present

## 2021-08-15 DIAGNOSIS — M25662 Stiffness of left knee, not elsewhere classified: Secondary | ICD-10-CM | POA: Diagnosis not present

## 2021-08-17 DIAGNOSIS — M25562 Pain in left knee: Secondary | ICD-10-CM | POA: Diagnosis not present

## 2021-08-17 DIAGNOSIS — M25662 Stiffness of left knee, not elsewhere classified: Secondary | ICD-10-CM | POA: Diagnosis not present

## 2021-08-23 DIAGNOSIS — M25562 Pain in left knee: Secondary | ICD-10-CM | POA: Diagnosis not present

## 2021-08-23 DIAGNOSIS — M25662 Stiffness of left knee, not elsewhere classified: Secondary | ICD-10-CM | POA: Diagnosis not present

## 2021-08-26 DIAGNOSIS — M25562 Pain in left knee: Secondary | ICD-10-CM | POA: Diagnosis not present

## 2021-08-26 DIAGNOSIS — M25662 Stiffness of left knee, not elsewhere classified: Secondary | ICD-10-CM | POA: Diagnosis not present

## 2021-08-29 DIAGNOSIS — M25562 Pain in left knee: Secondary | ICD-10-CM | POA: Diagnosis not present

## 2021-08-29 DIAGNOSIS — M25662 Stiffness of left knee, not elsewhere classified: Secondary | ICD-10-CM | POA: Diagnosis not present

## 2021-09-06 DIAGNOSIS — M25562 Pain in left knee: Secondary | ICD-10-CM | POA: Diagnosis not present

## 2021-09-06 DIAGNOSIS — M25662 Stiffness of left knee, not elsewhere classified: Secondary | ICD-10-CM | POA: Diagnosis not present

## 2021-09-15 ENCOUNTER — Other Ambulatory Visit: Payer: Medicare Other | Admitting: Adult Health

## 2021-09-29 NOTE — Progress Notes (Signed)
? ?Primary Care Physician:  Sharilyn Sites, MD ?Primary Gastroenterologist:  Dr. Gala Romney ? ?Chief Complaint  ?Patient presents with  ? Colonoscopy  ? ? ?HPI:   ?[name] is a 71 y.o. female with history of HTN, HLD, anxiety, constipation, adenomatous colon polyps, presenting today to discuss scheduling surveillance colonoscopy.  We have not seen patient since 2017. ? ?Her last colonoscopy was in October 2017 with diverticulosis in the sigmoid colon and descending colon, otherwise normal exam, redundant colon.  Recommended repeat in 5 years. ? ?Today: ?Doing well overall.  Taking MiraLAX and 2 colace daily which allows her to have a good BM daily. Would like to go back on Linzess, but is not sure if her insurance will cover it.  Denies abdominal pain, BRBPR, melena, unintentional weight loss.  Takes Tums as needed for occasional heartburn, but this is fairly rare.  Reports occasional nausea if she is not moving her bowels well.  Denies vomiting or dysphagia. ? ?Past Medical History:  ?Diagnosis Date  ? Anxiety   ? Constipation   ? Essential hypertension   ? GERD (gastroesophageal reflux disease)   ? Heart murmur   ? Hyperlipidemia   ? Insomnia   ? Obesity   ? Osteoarthritis   ? Superficial fungus infection of skin 11/25/2015  ? Type 2 diabetes mellitus ([place])   ? patient denies  ? Urge incontinence 11/04/2014  ? Vertigo   ? Vitamin D deficiency   ? ? ?Past Surgical History:  ?Procedure Laterality Date  ? ABDOMINAL HYSTERECTOMY  1989  ? CARPAL TUNNEL RELEASE Right 06/2016  ? right middle finger fused; wire in right thumb  ? COLONOSCOPY  10/20/2002  ? normal rectum/left-sided diverticula  ? COLONOSCOPY  07/06/2011  ? Dr. Gala RomneyDionisio David anal canal hemorrhoids, diminutive rectal and transverse colon polyps. 2 adenoma on path. Left-sided diverticulosis.  ? COLONOSCOPY N/A 04/21/2016  ? Surgeon: Daneil Dolin, MD;  diverticulosis in the sigmoid colon and descending colon, otherwise normal exam, redundant colon.   Recommended repeat in 5 years.  ? JOINT REPLACEMENT    ? PARTIAL HYSTERECTOMY  1987  ? SCAR DEBRIDEMENT OF TOTAL KNEE Left 06/28/2021  ? Procedure: Left knee open scar debridement versus patella revision;  Surgeon: Paralee Cancel, MD;  Location: [facility];  Service: Orthopedics;  Laterality: Left;  ? TOE SURGERY    ? second toe on both feet, bunionectomy  ? TOTAL HIP ARTHROPLASTY  2001/2008  ? multiple surgeries on right hip including replacements. Two replacements and four dislocations.   ? TOTAL HIP ARTHROPLASTY  2010  ? left  ? TOTAL HIP REVISION  12/25/2011  ? Procedure: TOTAL HIP REVISION;  Surgeon: Mauri Pole, MD;  Location: [facility];  Service: Orthopedics;  Laterality: Right;  ? TOTAL KNEE ARTHROPLASTY  2008  ? left  ? TOTAL KNEE ARTHROPLASTY  09/04/2011  ? Procedure: TOTAL KNEE ARTHROPLASTY;  Surgeon: Mauri Pole, MD;  Location: [facility];  Service: Orthopedics;  Laterality: Right;  ? TOTAL KNEE REVISION Left 03/22/2021  ? Procedure: TOTAL KNEE REVISION;  Surgeon: Paralee Cancel, MD;  Location: [facility];  Service: Orthopedics;  Laterality: Left;  ? TOTAL SHOULDER ARTHROPLASTY Left 10/08/2014  ? Procedure: LEFT TOTAL SHOULDER ARTHROPLASTY;  Surgeon: Justice Britain, MD;  Location: [facility];  Service: Orthopedics;  Laterality: Left;  ? TOTAL SHOULDER REPLACEMENT Left 10/08/2014  ? dr supple  ? ? ?Current Outpatient Medications  ?Medication Sig Dispense Refill  ? aspirin 81 MG chewable tablet Chew by  mouth daily.    ? cholecalciferol (VITAMIN D) 25 MCG (1000 UNIT) tablet Take 1,000 Units by mouth daily.    ? docusate sodium (COLACE) 100 MG capsule Take 1 capsule (100 mg total) by mouth 2 (two) times daily. (Patient taking differently: Take 200 mg by mouth daily.) 10 capsule 0  ? Flaxseed, Linseed, (FLAXSEED OIL) 1000 MG CAPS Take 1,000 mg by mouth daily.    ? hydroxychloroquine (PLAQUENIL) 200 MG tablet Take 400 mg by mouth daily.    ? linaclotide (LINZESS) 145 MCG CAPS capsule Take 1 capsule (145 mcg total) by mouth  daily before breakfast. 30 capsule 3  ? lisinopril-hydrochlorothiazide (PRINZIDE,ZESTORETIC) 20-12.5 MG per tablet Take 1 tablet by mouth daily before breakfast.    ? Omega-3 1000 MG CAPS Take 1,000 mg by mouth daily.    ? polyethylene glycol powder (GLYCOLAX/MIRALAX) 17 GM/SCOOP powder Take 17 g by mouth daily as needed for moderate constipation.    ? ?No current facility-administered medications for this visit.  ? ? ?Allergies as of 09/30/2021  ? (No Known Allergies)  ? ? ?Family History  ?Problem Relation Age of Onset  ? Heart failure Mother   ?     Died 68  ? Diabetes Mother   ? Hypertension Mother   ? Early death Father   ? Pneumonia Father   ? Alcohol abuse Father   ? Heart disease Brother   ? Hypertension Brother   ? Diabetes Brother   ? Hypertension Sister   ? Hypertension Sister   ? Sickle cell trait Brother   ? GER disease Brother   ? Thyroid disease Daughter   ? Heart failure Daughter   ? Heart attack Daughter   ? Heart disease Maternal Aunt   ? Heart disease Maternal Uncle   ? Colon cancer Neg Hx   ? ? ?Social History  ? ?Socioeconomic History  ? Marital status: Single  ?  Spouse name: Not on file  ? Number of children: 1  ? Years of education: Not on file  ? Highest education level: Not on file  ?Occupational History  ?  Employer: [employer]  ?Tobacco Use  ? Smoking status: Never  ? Smokeless tobacco: Never  ?Vaping Use  ? Vaping Use: Never used  ?Substance and Sexual Activity  ? Alcohol use: No  ? Drug use: No  ? Sexual activity: Not on file  ?Other Topics Concern  ? Not on file  ?Social History Narrative  ? Not on file  ? ?Social Determinants of Health  ? ?Financial Resource Strain: Not on file  ?Food Insecurity: Not on file  ?Transportation Needs: Not on file  ?Physical Activity: Not on file  ?Stress: Not on file  ?Social Connections: Not on file  ?Intimate Partner Violence: Not on file  ? ? ?Review of Systems: ?Gen: Denies any fever, chills, cold or flulike symptoms, presyncope,  syncope. ?CV: Denies chest pain, heart palpitations. ?Resp: Denies shortness of breath or cough. ?GI: See HPI ?GU : Denies urinary burning, urinary frequency, urinary hesitancy ?MS: Denies joint pain ?Derm: Denies rash ?Psych: Denies depression, anxiety ?Heme: See HPI ? ?Physical Exam: ?BP 138/72   Pulse 82   Temp 97.6 ?F (36.4 ?C) (Temporal)   Ht '5\' 1"'$  (1.549 m)   Wt 219 lb 9.6 oz (99.6 kg)   BMI 41.49 kg/m?  ?General:   Alert and oriented. Pleasant and cooperative. Well-nourished and well-developed.  ?Head:  Normocephalic and atraumatic. ?Eyes:  Without icterus, sclera clear and  conjunctiva pink.  ?Ears:  Normal auditory acuity. ?Lungs:  Clear to auscultation bilaterally. No wheezes, rales, or rhonchi. No distress.  ?Heart:  S1, S2 present without murmurs appreciated.  ?Abdomen:  +BS, soft, non-tender and non-distended. No HSM noted. No guarding or rebound. No masses appreciated.  ?Rectal:  Deferred  ?Msk:  Symmetrical without gross deformities. Normal posture. ?Extremities:  Without edema. ?Neurologic:  Alert and  oriented x4;  grossly normal neurologically. ?Skin:  Intact without significant lesions or rashes. ?Psych:  Normal mood and affect. ? ? ? ?Assessment:  ?71 year old female with history of HTN, HLD, anxiety, constipation, adenomatous colon polyps, presenting today to discuss scheduling surveillance colonoscopy.  Her last colonoscopy was in October 2017 with diverticulosis in the sigmoid colon and descending colon, redundant colon, otherwise normal exam.  Recommended repeat in 5 years.  Clinically, she is doing well.  Her constipation is currently controlled with MiraLAX and Colace, but she would like to resume Linzess 145 mcg daily if her insurance will cover it.  No other significant GI symptoms.  No alarm symptoms. No Fhx of colon cancer.  ? ? ?Plan:  ?Proceed with colonoscopy with propofol with Dr. Gala Romney in the near future. Proceed with upper endoscopy by Dr. Gala Romney in near future: the risks,  benefits, and alternatives have been discussed with the patient in detail. The patient states understanding and desires to proceed. ?ASA 3 ?If patient is unable to afford Linzess prescription, we will at least provide her with samp

## 2021-09-30 ENCOUNTER — Other Ambulatory Visit: Payer: Self-pay

## 2021-09-30 ENCOUNTER — Ambulatory Visit: Payer: Medicare Other | Admitting: Gastroenterology

## 2021-09-30 ENCOUNTER — Encounter: Payer: Self-pay | Admitting: Gastroenterology

## 2021-09-30 VITALS — BP 138/72 | HR 82 | Temp 97.6°F | Ht 61.0 in | Wt 219.6 lb

## 2021-09-30 DIAGNOSIS — K5904 Chronic idiopathic constipation: Secondary | ICD-10-CM

## 2021-09-30 DIAGNOSIS — Z86010 Personal history of colonic polyps: Secondary | ICD-10-CM | POA: Diagnosis not present

## 2021-09-30 MED ORDER — LINACLOTIDE 145 MCG PO CAPS: 145.0000 ug | ORAL_CAPSULE | Freq: Every day | ORAL | 3 refills | Status: AC

## 2021-09-30 NOTE — Patient Instructions (Addendum)
We will arrange for you to have a colonoscopy in the near future with Dr. Gala Romney. ? ?For constipation: ?I am sending in a prescription for Linzess 145 mcg.  Take this medication every morning at least 30 minutes before your first meal. ?Please let me know if you have any trouble obtaining Linzess due to cost.  ? ?We will follow-up with you in the office after your colonoscopy. ? ?It was very nice meeting you today!  ? ?Aliene Altes, PA-C ?[facility] ? ? ?

## 2021-10-07 DIAGNOSIS — M1991 Primary osteoarthritis, unspecified site: Secondary | ICD-10-CM | POA: Diagnosis not present

## 2021-10-07 DIAGNOSIS — I10 Essential (primary) hypertension: Secondary | ICD-10-CM | POA: Diagnosis not present

## 2021-10-07 DIAGNOSIS — E782 Mixed hyperlipidemia: Secondary | ICD-10-CM | POA: Diagnosis not present

## 2021-10-12 ENCOUNTER — Telehealth: Payer: Self-pay | Admitting: *Deleted

## 2021-10-12 ENCOUNTER — Telehealth: Payer: Self-pay | Admitting: Internal Medicine

## 2021-10-12 NOTE — Telephone Encounter (Signed)
LMOVM to call back to schedule TCS w/ propofol, ASA 3 Dr. Gala Romney ?

## 2021-10-12 NOTE — Telephone Encounter (Signed)
See prior note also, LMOVM to call back ?

## 2021-10-12 NOTE — Telephone Encounter (Signed)
Pt returning call. [phone] ?

## 2021-10-13 ENCOUNTER — Encounter: Payer: Self-pay | Admitting: *Deleted

## 2021-10-13 MED ORDER — PEG 3350-KCL-NA BICARB-NACL 420 G PO SOLR: ORAL | 0 refills | Status: DC

## 2021-10-13 NOTE — Telephone Encounter (Signed)
Patient returned call. Scheduled for 5/25 at 1:30pm. Aware will mail prep instructions/pre-op appt. Will send rx to pharmacy. Hand wrote on instructions regarding starting linzess 132mg 4 days prior to prep.  ? ? ?PA approved via [place] Auth# [id number] DOS: Dec 01, 2021 - Mar 01, 2022 ?

## 2021-10-13 NOTE — Addendum Note (Signed)
Addended by: Cheron Every on: 10/13/2021 10:58 AM ? ? Modules accepted: Orders ? ?

## 2021-10-17 ENCOUNTER — Other Ambulatory Visit: Payer: Medicare Other | Admitting: Adult Health

## 2021-11-24 ENCOUNTER — Telehealth: Payer: Self-pay

## 2021-11-24 NOTE — Telephone Encounter (Signed)
Pt LMOVM, she was started on antibiotic yesterday by her dentist. She wanted to know if it would affect TCS scheduled for 12/01/21.  Tried to call pt back, LMOAM to inform her antibiotic wouldn't affect procedure.

## 2021-11-24 NOTE — Telephone Encounter (Signed)
Noted. Agree, should be fine to proceed with colonoscopy.

## 2021-11-24 NOTE — Patient Instructions (Signed)
TRYPHENA PERKOVICH  11/24/2021     '@PREFPERIOPPHARMACY'$ @   Your procedure is scheduled on  12/01/2021.   Report to [facility] at  1145  A.M.   Call this number if you have problems the morning of surgery:  [phone]   Remember:  Follow the diet and prep instructions given to you by the office.     DO NOT take any medications for diabetes the morning of your procedure.    Take these medicines the morning of surgery with A SIP OF WATER                                          plaquenil     Do not wear jewelry, make-up or nail polish.  Do not wear lotions, powders, or perfumes, or deodorant.  Do not shave 48 hours prior to surgery.  Men may shave face and neck.  Do not bring valuables to the hospital.  [facility] is not responsible for any belongings or valuables.  Contacts, dentures or bridgework may not be worn into surgery.  Leave your suitcase in the car.  After surgery it may be brought to your room.  For patients admitted to the hospital, discharge time will be determined by your treatment team.  Patients discharged the day of surgery will not be allowed to drive home and must have someone with them for 24 hours.    Special instructions:  DO NOT smoke tobacco or vape for 24 hours before your procedure.  Please read over the following fact sheets that you were given. Anesthesia Post-op Instructions and Care and Recovery After Surgery      Colonoscopy, Adult, Care After The following information offers guidance on how to care for yourself after your procedure. Your health care provider may also give you more specific instructions. If you have problems or questions, contact your health care provider. What can I expect after the procedure? After the procedure, it is common to have: A small amount of blood in your stool for 24 hours after the procedure. Some gas. Mild cramping or bloating of your abdomen. Follow these instructions at home: Eating and  drinking  Drink enough fluid to keep your urine pale yellow. Follow instructions from your health care provider about eating or drinking restrictions. Resume your normal diet as told by your health care provider. Avoid heavy or fried foods that are hard to digest. Activity Rest as told by your health care provider. Avoid sitting for a long time without moving. Get up to take short walks every 1-2 hours. This is important to improve blood flow and breathing. Ask for help if you feel weak or unsteady. Return to your normal activities as told by your health care provider. Ask your health care provider what activities are safe for you. Managing cramping and bloating  Try walking around when you have cramps or feel bloated. If directed, apply heat to your abdomen as told by your health care provider. Use the heat source that your health care provider recommends, such as a moist heat pack or a heating pad. Place a towel between your skin and the heat source. Leave the heat on for 20-30 minutes. Remove the heat if your skin turns bright red. This is especially important if you are unable to feel pain, heat, or cold. You have a greater risk of  getting burned. General instructions If you were given a sedative during the procedure, it can affect you for several hours. Do not drive or operate machinery until your health care provider says that it is safe. For the first 24 hours after the procedure: Do not sign important documents. Do not drink alcohol. Do your regular daily activities at a slower pace than normal. Eat soft foods that are easy to digest. Take over-the-counter and prescription medicines only as told by your health care provider. Keep all follow-up visits. This is important. Contact a health care provider if: You have blood in your stool 2-3 days after the procedure. Get help right away if: You have more than a small spotting of blood in your stool. You have large blood clots in your  stool. You have swelling of your abdomen. You have nausea or vomiting. You have a fever. You have increasing pain in your abdomen that is not relieved with medicine. These symptoms may be an emergency. Get help right away. Call 911. Do not wait to see if the symptoms will go away. Do not drive yourself to the hospital. Summary After the procedure, it is common to have a small amount of blood in your stool. You may also have mild cramping and bloating of your abdomen. If you were given a sedative during the procedure, it can affect you for several hours. Do not drive or operate machinery until your health care provider says that it is safe. Get help right away if you have a lot of blood in your stool, nausea or vomiting, a fever, or increased pain in your abdomen. This information is not intended to replace advice given to you by your health care provider. Make sure you discuss any questions you have with your health care provider. Document Revised: 02/16/2021 Document Reviewed: 02/16/2021 Elsevier Patient Education  [place] After This sheet gives you information about how to care for yourself after your procedure. Your health care provider may also give you more specific instructions. If you have problems or questions, contact your health care provider. What can I expect after the procedure? After the procedure, it is common to have: Tiredness. Forgetfulness about what happened after the procedure. Impaired judgment for important decisions. Nausea or vomiting. Some difficulty with balance. Follow these instructions at home: For the time period you were told by your health care provider:     Rest as needed. Do not participate in activities where you could fall or become injured. Do not drive or use machinery. Do not drink alcohol. Do not take sleeping pills or medicines that cause drowsiness. Do not make important decisions or sign legal  documents. Do not take care of children on your own. Eating and drinking Follow the diet that is recommended by your health care provider. Drink enough fluid to keep your urine pale yellow. If you vomit: Drink water, juice, or soup when you can drink without vomiting. Make sure you have little or no nausea before eating solid foods. General instructions Have a responsible adult stay with you for the time you are told. It is important to have someone help care for you until you are awake and alert. Take over-the-counter and prescription medicines only as told by your health care provider. If you have sleep apnea, surgery and certain medicines can increase your risk for breathing problems. Follow instructions from your health care provider about wearing your sleep device: Anytime you are sleeping, including during daytime naps.  While taking prescription pain medicines, sleeping medicines, or medicines that make you drowsy. Avoid smoking. Keep all follow-up visits as told by your health care provider. This is important. Contact a health care provider if: You keep feeling nauseous or you keep vomiting. You feel light-headed. You are still sleepy or having trouble with balance after 24 hours. You develop a rash. You have a fever. You have redness or swelling around the IV site. Get help right away if: You have trouble breathing. You have new-onset confusion at home. Summary For several hours after your procedure, you may feel tired. You may also be forgetful and have poor judgment. Have a responsible adult stay with you for the time you are told. It is important to have someone help care for you until you are awake and alert. Rest as told. Do not drive or operate machinery. Do not drink alcohol or take sleeping pills. Get help right away if you have trouble breathing, or if you suddenly become confused. This information is not intended to replace advice given to you by your health care  provider. Make sure you discuss any questions you have with your health care provider. Document Revised: 05/31/2021 Document Reviewed: 05/29/2019 Elsevier Patient Education  [place].

## 2021-11-24 NOTE — Telephone Encounter (Signed)
Pt called back, informed her med wouldn't affect procedure.

## 2021-11-29 ENCOUNTER — Encounter (HOSPITAL_COMMUNITY)
Admission: RE | Admit: 2021-11-29 | Discharge: 2021-11-29 | Disposition: A | Discharge status: Home / Self Care | Payer: Medicare Other | Source: Ambulatory Visit | Attending: Internal Medicine | Admitting: Internal Medicine

## 2021-11-29 ENCOUNTER — Encounter (HOSPITAL_COMMUNITY): Payer: Self-pay

## 2021-11-29 VITALS — BP 128/69 | HR 84 | Temp 98.6°F | Resp 18 | Ht 61.0 in | Wt 219.0 lb

## 2021-11-29 DIAGNOSIS — Z01818 Encounter for other preprocedural examination: Secondary | ICD-10-CM | POA: Insufficient documentation

## 2021-11-29 DIAGNOSIS — E119 Type 2 diabetes mellitus without complications: Secondary | ICD-10-CM | POA: Diagnosis not present

## 2021-11-29 DIAGNOSIS — I10 Essential (primary) hypertension: Secondary | ICD-10-CM | POA: Diagnosis not present

## 2021-11-29 LAB — BASIC METABOLIC PANEL
Anion gap: 9 (ref 5–15)
BUN: 14 mg/dL (ref 8–23)
CO2: 23 mmol/L (ref 22–32)
Calcium: 10.2 mg/dL (ref 8.9–10.3)
Chloride: 107 mmol/L (ref 98–111)
Creatinine, Ser: 0.89 mg/dL (ref 0.44–1.00)
GFR, Estimated: >60 mL/min (ref >60)
Glucose, Bld: 95 mg/dL (ref 70–99)
Potassium: 3.9 mmol/L (ref 3.5–5.1)
Sodium: 139 mmol/L (ref 135–145)

## 2021-12-01 ENCOUNTER — Encounter (HOSPITAL_COMMUNITY): Payer: Self-pay | Admitting: Internal Medicine

## 2021-12-01 ENCOUNTER — Ambulatory Visit (HOSPITAL_BASED_OUTPATIENT_CLINIC_OR_DEPARTMENT_OTHER): Payer: Medicare Other | Admitting: Anesthesiology

## 2021-12-01 ENCOUNTER — Ambulatory Visit (HOSPITAL_COMMUNITY): Payer: Medicare Other | Admitting: Anesthesiology

## 2021-12-01 ENCOUNTER — Ambulatory Visit (HOSPITAL_COMMUNITY)
Admission: RE | Admit: 2021-12-01 | Discharge: 2021-12-01 | Disposition: A | Discharge status: Home / Self Care | Payer: Medicare Other | Attending: Internal Medicine | Admitting: Internal Medicine

## 2021-12-01 ENCOUNTER — Encounter (HOSPITAL_COMMUNITY)
Admission: RE | Disposition: A | Discharge status: Home / Self Care | Payer: Self-pay | Source: Home / Self Care | Attending: Internal Medicine

## 2021-12-01 DIAGNOSIS — Z7984 Long term (current) use of oral hypoglycemic drugs: Secondary | ICD-10-CM | POA: Insufficient documentation

## 2021-12-01 DIAGNOSIS — Z01419 Encounter for gynecological examination (general) (routine) without abnormal findings: Secondary | ICD-10-CM

## 2021-12-01 DIAGNOSIS — Z96659 Presence of unspecified artificial knee joint: Secondary | ICD-10-CM | POA: Diagnosis not present

## 2021-12-01 DIAGNOSIS — E119 Type 2 diabetes mellitus without complications: Secondary | ICD-10-CM | POA: Diagnosis not present

## 2021-12-01 DIAGNOSIS — Z96652 Presence of left artificial knee joint: Secondary | ICD-10-CM

## 2021-12-01 DIAGNOSIS — Z86010 Personal history of colonic polyps: Secondary | ICD-10-CM | POA: Insufficient documentation

## 2021-12-01 DIAGNOSIS — Z83719 Family history of colon polyps, unspecified: Secondary | ICD-10-CM

## 2021-12-01 DIAGNOSIS — Z79899 Other long term (current) drug therapy: Secondary | ICD-10-CM | POA: Diagnosis not present

## 2021-12-01 DIAGNOSIS — K5900 Constipation, unspecified: Secondary | ICD-10-CM

## 2021-12-01 DIAGNOSIS — K5909 Other constipation: Secondary | ICD-10-CM | POA: Diagnosis not present

## 2021-12-01 DIAGNOSIS — Z96619 Presence of unspecified artificial shoulder joint: Secondary | ICD-10-CM

## 2021-12-01 DIAGNOSIS — I10 Essential (primary) hypertension: Secondary | ICD-10-CM

## 2021-12-01 DIAGNOSIS — K5730 Diverticulosis of large intestine without perforation or abscess without bleeding: Secondary | ICD-10-CM | POA: Insufficient documentation

## 2021-12-01 DIAGNOSIS — K219 Gastro-esophageal reflux disease without esophagitis: Secondary | ICD-10-CM | POA: Diagnosis not present

## 2021-12-01 DIAGNOSIS — K5904 Chronic idiopathic constipation: Secondary | ICD-10-CM

## 2021-12-01 DIAGNOSIS — K635 Polyp of colon: Secondary | ICD-10-CM

## 2021-12-01 DIAGNOSIS — D124 Benign neoplasm of descending colon: Secondary | ICD-10-CM | POA: Insufficient documentation

## 2021-12-01 DIAGNOSIS — N3941 Urge incontinence: Secondary | ICD-10-CM

## 2021-12-01 DIAGNOSIS — K625 Hemorrhage of anus and rectum: Secondary | ICD-10-CM

## 2021-12-01 DIAGNOSIS — B369 Superficial mycosis, unspecified: Secondary | ICD-10-CM | POA: Diagnosis not present

## 2021-12-01 DIAGNOSIS — Z860101 Personal history of adenomatous and serrated colon polyps: Secondary | ICD-10-CM

## 2021-12-01 DIAGNOSIS — Z8371 Family history of colonic polyps: Secondary | ICD-10-CM

## 2021-12-01 DIAGNOSIS — Z96649 Presence of unspecified artificial hip joint: Secondary | ICD-10-CM

## 2021-12-01 DIAGNOSIS — R195 Other fecal abnormalities: Secondary | ICD-10-CM

## 2021-12-01 DIAGNOSIS — Z1212 Encounter for screening for malignant neoplasm of rectum: Secondary | ICD-10-CM | POA: Diagnosis not present

## 2021-12-01 DIAGNOSIS — Z1211 Encounter for screening for malignant neoplasm of colon: Secondary | ICD-10-CM | POA: Diagnosis not present

## 2021-12-01 HISTORY — PX: COLONOSCOPY WITH PROPOFOL: SHX5780

## 2021-12-01 HISTORY — PX: BIOPSY: SHX5522

## 2021-12-01 LAB — GLUCOSE, CAPILLARY: Glucose-Capillary: 85 mg/dL (ref 70–99)

## 2021-12-01 SURGERY — COLONOSCOPY WITH PROPOFOL
Anesthesia: General

## 2021-12-01 MED ORDER — LACTATED RINGERS IV SOLN
INTRAVENOUS | Status: DC
  Administered 2021-12-01: 1000 mL via INTRAVENOUS

## 2021-12-01 MED ORDER — PROPOFOL 500 MG/50ML IV EMUL
INTRAVENOUS | Status: AC
  Filled 2021-12-01: qty 50

## 2021-12-01 MED ORDER — PROPOFOL 10 MG/ML IV BOLUS
INTRAVENOUS | Status: DC | PRN
  Administered 2021-12-01: 80 mg via INTRAVENOUS

## 2021-12-01 MED ORDER — GLYCOPYRROLATE PF 0.2 MG/ML IJ SOSY
PREFILLED_SYRINGE | INTRAMUSCULAR | Status: AC
  Filled 2021-12-01: qty 1

## 2021-12-01 MED ORDER — GLYCOPYRROLATE PF 0.2 MG/ML IJ SOSY
PREFILLED_SYRINGE | INTRAMUSCULAR | Status: DC | PRN
  Administered 2021-12-01: .2 mg via INTRAVENOUS

## 2021-12-01 MED ORDER — PROPOFOL 500 MG/50ML IV EMUL
INTRAVENOUS | Status: DC | PRN
  Administered 2021-12-01: 150 ug/kg/min via INTRAVENOUS

## 2021-12-01 NOTE — Anesthesia Preprocedure Evaluation (Signed)
Anesthesia Evaluation  Patient identified by MRN, date of birth, ID band Patient awake    Reviewed: Allergy & Precautions, NPO status , Patient's Chart, lab work & pertinent test results  History of Anesthesia Complications (+) PROLONGED EMERGENCE and history of anesthetic complications  Airway Mallampati: II  TM Distance: >3 FB Neck ROM: Full    Dental  (+) Dental Advisory Given, Teeth Intact   Pulmonary neg pulmonary ROS,    Pulmonary exam normal breath sounds clear to auscultation       Cardiovascular Exercise Tolerance: Good hypertension, Pt. on medications Normal cardiovascular exam+ Valvular Problems/Murmurs  Rhythm:Regular Rate:Normal     Neuro/Psych PSYCHIATRIC DISORDERS Anxiety negative neurological ROS     GI/Hepatic Neg liver ROS, GERD  Medicated and Controlled,  Endo/Other  diabetes, Well Controlled, Type 2, Oral Hypoglycemic Agents  Renal/GU negative Renal ROS  negative genitourinary   Musculoskeletal  (+) Arthritis , Osteoarthritis,    Abdominal   Peds negative pediatric ROS (+)  Hematology negative hematology ROS (+)   Anesthesia Other Findings   Reproductive/Obstetrics negative OB ROS                           Anesthesia Physical Anesthesia Plan  ASA: 3  Anesthesia Plan: General   Post-op Pain Management: Minimal or no pain anticipated   Induction: Intravenous  PONV Risk Score and Plan: Propofol infusion  Airway Management Planned: Nasal Cannula and Natural Airway  Additional Equipment:   Intra-op Plan:   Post-operative Plan:   Informed Consent: I have reviewed the patients History and Physical, chart, labs and discussed the procedure including the risks, benefits and alternatives for the proposed anesthesia with the patient or authorized representative who has indicated his/her understanding and acceptance.       Plan Discussed with: CRNA and  Surgeon  Anesthesia Plan Comments:         Anesthesia Quick Evaluation

## 2021-12-01 NOTE — Discharge Instructions (Signed)
  Colonoscopy Discharge Instructions  Read the instructions outlined below and refer to this sheet in the next few weeks. These discharge instructions provide you with general information on caring for yourself after you leave the hospital. Your doctor may also give you specific instructions. While your treatment has been planned according to the most current medical practices available, unavoidable complications occasionally occur. If you have any problems or questions after discharge, call Dr. Gala [name] at [phone]. ACTIVITY You may resume your regular activity, but move at a slower pace for the next 24 hours.  Take frequent rest periods for the next 24 hours.  Walking will help get rid of the air and reduce the bloated feeling in your belly (abdomen).  No driving for 24 hours (because of the medicine (anesthesia) used during the test).   Do not sign any important legal documents or operate any machinery for 24 hours (because of the anesthesia used during the test).  NUTRITION Drink plenty of fluids.  You may resume your normal diet as instructed by your doctor.  Begin with a light meal and progress to your normal diet. Heavy or fried foods are harder to digest and may make you feel sick to your stomach (nauseated).  Avoid alcoholic beverages for 24 hours or as instructed.  MEDICATIONS You may resume your normal medications unless your doctor tells you otherwise.  WHAT YOU CAN EXPECT TODAY Some feelings of bloating in the abdomen.  Passage of more gas than usual.  Spotting of blood in your stool or on the toilet paper.  IF YOU HAD POLYPS REMOVED DURING THE COLONOSCOPY: No aspirin products for 7 days or as instructed.  No alcohol for 7 days or as instructed.  Eat a soft diet for the next 24 hours.  FINDING OUT THE RESULTS OF YOUR TEST Not all test results are available during your visit. If your test results are not back during the visit, make an appointment with your caregiver to find out the  results. Do not assume everything is normal if you have not heard from your caregiver or the medical facility. It is important for you to follow up on all of your test results.  SEEK IMMEDIATE MEDICAL ATTENTION IF: You have more than a spotting of blood in your stool.  Your belly is swollen (abdominal distention).  You are nauseated or vomiting.  You have a temperature over 101.  You have abdominal pain or discomfort that is severe or gets worse throughout the day.    1 polyp removed from your colon  Further recommendations to follow pending review of pathology report  Colon polyp and diverticulosis information provided   at patient request, call [name] at [phone] -   discussed findings and recommendations

## 2021-12-01 NOTE — Op Note (Signed)
[facility] Patient Name: [name] Procedure Date: 12/01/2021 1:06 PM MRN: [id number] Date of Birth: October 23, 1950 Attending MD: Norvel Richards , MD CSN: [id number] Age: 71 Admit Type: Outpatient Procedure:                Colonoscopy Indications:              High risk colon cancer surveillance: Personal                            history of colonic polyps Providers:                Norvel Richards, MD, Janeece Riggers, RN, Randa Spike, Technician Referring MD:              Medicines:                Propofol per Anesthesia Complications:            No immediate complications. Estimated Blood Loss:     Estimated blood loss was minimal. Procedure:                Pre-Anesthesia Assessment:                           - Prior to the procedure, a History and Physical                            was performed, and patient medications and                            allergies were reviewed. The patient's tolerance of                            previous anesthesia was also reviewed. The risks                            and benefits of the procedure and the sedation                            options and risks were discussed with the patient.                            All questions were answered, and informed consent                            was obtained. Prior Anticoagulants: The patient has                            taken no previous anticoagulant or antiplatelet                            agents. ASA Grade Assessment: III - A patient with  severe systemic disease. After reviewing the risks                            and benefits, the patient was deemed in                            satisfactory condition to undergo the procedure.                           After obtaining informed consent, the colonoscope                            was passed under direct vision. Throughout the                            procedure, the  patient's blood pressure, pulse, and                            oxygen saturations were monitored continuously. The                            [id number]) scope was introduced through the                            anus and advanced to the the cecum, identified by                            appendiceal orifice and ileocecal valve. The                            colonoscopy was performed without difficulty. The                            patient tolerated the procedure well. The quality                            of the bowel preparation was adequate. Scope In: 1:18:37 PM Scope Out: 1:34:02 PM Scope Withdrawal Time: 0 hours 7 minutes 51 seconds  Total Procedure Duration: 0 hours 15 minutes 25 seconds  Findings:      The perianal and digital rectal examinations were normal.      Scattered small and large-mouthed diverticula were found in the entire       colon.      A 3 mm polyp was found in the descending colon. The polyp was sessile.       The polyp was removed with a cold biopsy forceps. Resection and       retrieval were complete. Estimated blood loss was minimal.      The exam was otherwise without abnormality on direct and retroflexion       views. Impression:               - Diverticulosis in the entire examined colon.                           - One 3 mm polyp in  the descending colon, removed                            with a cold biopsy forceps. Resected and retrieved.                           - The examination was otherwise normal on direct                            and retroflexion views. Moderate Sedation:      Moderate (conscious) sedation was personally administered by an       anesthesia professional. The following parameters were monitored: oxygen       saturation, heart rate, blood pressure, respiratory rate, EKG, adequacy       of pulmonary ventilation, and response to care. Recommendation:           - Patient has a contact number available for                             emergencies. The signs and symptoms of potential                            delayed complications were discussed with the                            patient. Return to normal activities tomorrow.                            Written discharge instructions were provided to the                            patient.                           - Resume previous diet.                           - Continue present medications.                           - Repeat colonoscopy after studies are complete for                            surveillance.                           - Return to GI office in 1 week. Procedure Code(s):        --- Professional ---                           [phone], Colonoscopy, flexible; with biopsy, single                            or multiple Diagnosis Code(s):        --- Professional ---  Z86.010, Personal history of colonic polyps                           K63.5, Polyp of colon                           K57.30, Diverticulosis of large intestine without                            perforation or abscess without bleeding CPT copyright 2019 American Medical Association. All rights reserved. The codes documented in this report are preliminary and upon coder review may  be revised to meet current compliance requirements. Cristopher Estimable. Shaunda Tipping, MD Norvel Richards, MD 12/01/2021 1:40:28 PM This report has been signed electronically. Number of Addenda: 0

## 2021-12-01 NOTE — Anesthesia Postprocedure Evaluation (Signed)
Anesthesia Post Note  Patient: Cianna H Hogsett  Procedure(s) Performed: COLONOSCOPY WITH PROPOFOL BIOPSY  Patient location during evaluation: Phase II Anesthesia Type: General Level of consciousness: awake and alert and oriented Pain management: pain level controlled Vital Signs Assessment: post-procedure vital signs reviewed and stable Respiratory status: spontaneous breathing, nonlabored ventilation and respiratory function stable Cardiovascular status: blood pressure returned to baseline and stable Postop Assessment: no apparent nausea or vomiting Anesthetic complications: no   No notable events documented.   Last Vitals:  Vitals:   12/01/21 1339 12/01/21 1342  BP:  (!) 90/57  Pulse: 94   Resp: 19   Temp: 36.7 C   SpO2: 98%     Last Pain:  Vitals:   12/01/21 1339  TempSrc: Oral  PainSc: 0-No pain                 Jacey Eckerson C Carla Whilden

## 2021-12-01 NOTE — H&P (Signed)
$'@LOGO'i$ @   Primary Care Physician:  Sharilyn Sites, MD Primary Gastroenterologist:  Dr. Gala Romney  Pre-Procedure History & Physical: HPI:  [name] is a 71 y.o. female here for  surveillance colonoscopy.  History of colonic adenoma distant past.  Negative colonoscopy (diverticulosis) 2017.  Chronic constipation managed well with Linzess.  Past Medical History:  Diagnosis Date   Anxiety    Complication of anesthesia    difficult to wake up after anesthesia   Constipation    Essential hypertension    GERD (gastroesophageal reflux disease)    Heart murmur    Hyperlipidemia    Insomnia    Obesity    Osteoarthritis    Superficial fungus infection of skin 11/25/2015   Type 2 diabetes mellitus ([place])    patient denies   Urge incontinence 11/04/2014   Vertigo    Vitamin D deficiency     Past Surgical History:  Procedure Laterality Date   ABDOMINAL HYSTERECTOMY  1989   CARPAL TUNNEL RELEASE Right 06/2016   right middle finger fused; wire in right thumb   COLONOSCOPY  10/20/2002   normal rectum/left-sided diverticula   COLONOSCOPY  07/06/2011   Dr. Gala RomneyDionisio David anal canal hemorrhoids, diminutive rectal and transverse colon polyps. 2 adenoma on path. Left-sided diverticulosis.   COLONOSCOPY N/A 04/21/2016   Surgeon: Daneil Dolin, MD;  diverticulosis in the sigmoid colon and descending colon, otherwise normal exam, redundant colon.  Recommended repeat in 5 years.   JOINT REPLACEMENT     PARTIAL HYSTERECTOMY  1987   SCAR DEBRIDEMENT OF TOTAL KNEE Left 06/28/2021   Procedure: Left knee open scar debridement versus patella revision;  Surgeon: Paralee Cancel, MD;  Location: [facility];  Service: Orthopedics;  Laterality: Left;   TOE SURGERY     second toe on both feet, bunionectomy   TOTAL HIP ARTHROPLASTY  2001/2008   multiple surgeries on right hip including replacements. Two replacements and four dislocations.    TOTAL HIP ARTHROPLASTY  2010   left   TOTAL HIP REVISION  12/25/2011    Procedure: TOTAL HIP REVISION;  Surgeon: Mauri Pole, MD;  Location: [facility];  Service: Orthopedics;  Laterality: Right;   TOTAL KNEE ARTHROPLASTY  2008   left   TOTAL KNEE ARTHROPLASTY  09/04/2011   Procedure: TOTAL KNEE ARTHROPLASTY;  Surgeon: Mauri Pole, MD;  Location: [facility];  Service: Orthopedics;  Laterality: Right;   TOTAL KNEE REVISION Left 03/22/2021   Procedure: TOTAL KNEE REVISION;  Surgeon: Paralee Cancel, MD;  Location: [facility];  Service: Orthopedics;  Laterality: Left;   TOTAL SHOULDER ARTHROPLASTY Left 10/08/2014   Procedure: LEFT TOTAL SHOULDER ARTHROPLASTY;  Surgeon: Justice Britain, MD;  Location: [place];  Service: Orthopedics;  Laterality: Left;   TOTAL SHOULDER REPLACEMENT Left 10/08/2014   dr supple    Prior to Admission medications   Medication Sig Start Date End Date Taking? Authorizing Provider  b complex vitamins capsule Take 1 capsule by mouth daily.   Yes [provider]  CALCIUM-VITAMIN D PO Take 1 tablet by mouth daily.   Yes [provider]  clindamycin (CLEOCIN) 300 MG capsule Take 300 mg by mouth every 6 (six) hours. 11/23/21  Yes [provider]  diclofenac Sodium (VOLTAREN) 1 % GEL Apply 2 g topically daily as needed (pain).   Yes [provider]  docusate sodium (COLACE) 100 MG capsule Take 1 capsule (100 mg total) by mouth 2 (two) times daily. Patient taking differently: Take 200 mg by  mouth daily. 03/23/21  Yes Irving Copas, PA-C  hydroxychloroquine (PLAQUENIL) 200 MG tablet Take 400 mg by mouth daily.   Yes [provider]  linaclotide Rolan Lipa) 145 MCG CAPS capsule Take 1 capsule (145 mcg total) by mouth daily before breakfast. 09/30/21  Yes Jodi Mourning, Kristen S, PA-C  lisinopril-hydrochlorothiazide (PRINZIDE,ZESTORETIC) 20-12.5 MG per tablet Take 1 tablet by mouth daily before breakfast.   Yes [provider]  Multiple Vitamin (MULTIVITAMIN WITH MINERALS) TABS tablet Take 1 tablet by mouth daily.    Yes [provider]  Omega-3 Fatty Acids (FISH OIL) 1200 MG CAPS Take 1,200 mg by mouth daily.   Yes [provider]  polyethylene glycol-electrolytes (NULYTELY) 420 g solution As directed 10/13/21  Yes Ataya Murdy, Cristopher Estimable, MD  zolpidem (AMBIEN) 10 MG tablet Take 10 mg by mouth at bedtime. 11/18/21  Yes [provider]    Allergies as of 10/13/2021   (No Known Allergies)    Family History  Problem Relation Age of Onset   Heart failure Mother        Died 45   Diabetes Mother    Hypertension Mother    Early death Father    Pneumonia Father    Alcohol abuse Father    Heart disease Brother    Hypertension Brother    Diabetes Brother    Hypertension Sister    Hypertension Sister    Sickle cell trait Brother    GER disease Brother    Thyroid disease Daughter    Heart failure Daughter    Heart attack Daughter    Heart disease Maternal Aunt    Heart disease Maternal Uncle    Colon cancer Neg Hx     Social History   Socioeconomic History   Marital status: Single    Spouse name: Not on file   Number of children: 1   Years of education: Not on file   Highest education level: Not on file  Occupational History    Employer: [employer]  Tobacco Use   Smoking status: Never   Smokeless tobacco: Never  Vaping Use   Vaping Use: Never used  Substance and Sexual Activity   Alcohol use: No   Drug use: No   Sexual activity: Not on file  Other Topics Concern   Not on file  Social History Narrative   Not on file   Social Determinants of Health   Financial Resource Strain: Not on file  Food Insecurity: Not on file  Transportation Needs: Not on file  Physical Activity: Not on file  Stress: Not on file  Social Connections: Not on file  Intimate Partner Violence: Not on file    Review of Systems: See HPI, otherwise negative ROS  Physical Exam: BP 109/70   Pulse 84   Temp 98.9 F (37.2 C) (Oral)   Resp 14   Ht '5\' 1"'$  (1.549 m)   Wt 99.3 kg    SpO2 100%   BMI 41.36 kg/m  Neck:  Supple; no masses or thyromegaly. No significant cervical adenopathy. Lungs:  Clear throughout to auscultation.   No wheezes, crackles, or rhonchi. No acute distress. Heart:  Regular rate and rhythm; no murmurs, clicks, rubs,  or gallops. Abdomen: Non-distended, normal bowel sounds.  Soft and nontender without appreciable mass or hepatosplenomegaly.  Pulses:  Normal pulses noted. Extremities:  Without clubbing or edema.  Impression/Plan:    70 year old lady with a distant history colonic adenoma; here for surveillance colonoscopy. The risks, benefits, limitations, alternatives  and imponderables have been reviewed with the patient. Questions have been answered. All parties are agreeable.       Notice: This dictation was prepared with Dragon dictation along with smaller phrase technology. Any transcriptional errors that result from this process are unintentional and may not be corrected upon review.

## 2021-12-01 NOTE — Transfer of Care (Signed)
Immediate Anesthesia Transfer of Care Note  Patient: [name]  Procedure(s) Performed: COLONOSCOPY WITH PROPOFOL BIOPSY  Patient Location: PACU  Anesthesia Type:General  Level of Consciousness: awake, alert  and oriented  Airway & Oxygen Therapy: Patient Spontanous Breathing  Post-op Assessment: Report given to RN, Post -op Vital signs reviewed and stable, Patient moving all extremities X 4 and Patient able to stick tongue midline  Post vital signs: Reviewed  Last Vitals:  Vitals Value Taken Time  BP 106/53   Temp 98.6   Pulse 94   Resp 12   SpO2 100     Last Pain:  Vitals:   12/01/21 1218  TempSrc: Oral  PainSc: 0-No pain      Patients Stated Pain Goal: 8 (60/63/01 6010)  Complications: No notable events documented.

## 2021-12-06 ENCOUNTER — Encounter: Payer: Self-pay | Admitting: Internal Medicine

## 2021-12-06 LAB — SURGICAL PATHOLOGY

## 2021-12-08 ENCOUNTER — Encounter (HOSPITAL_COMMUNITY): Payer: Self-pay | Admitting: Internal Medicine

## 2021-12-13 ENCOUNTER — Other Ambulatory Visit (HOSPITAL_COMMUNITY): Payer: Self-pay | Admitting: Family Medicine

## 2021-12-13 DIAGNOSIS — Z1231 Encounter for screening mammogram for malignant neoplasm of breast: Secondary | ICD-10-CM

## 2021-12-13 NOTE — Progress Notes (Signed)
GI Office Note    Referring Provider: Sharilyn Sites, MD Primary Care Physician:  Sharilyn Sites, MD Primary GI: Dr. Gala Romney  Date:  12/15/2021  ID:  [name], DOB 1950-12-21, MRN [id number]   Chief Complaint   Chief Complaint  Patient presents with   Follow-up    Visit after hospital    History of Present Illness  [name] is a 71 y.o. female with a history of HTN, HLD, anxiety, constipation, adenomatous colon polyps presenting today for post procedure follow up.   Patient last seen 09/30/2021 to discuss scheduling surveillance colonoscopy.  She was doing well overall taking MiraLAX and 2 Colace capsules daily, and having a daily BM.  She requested to go back on Linzess but was unsure if her insurance would cover it.  She denied any abdominal pain or alarm symptoms.  Was taking Tums as needed for occasional heartburn but very rarely.  Also reported occasional nausea if not moving her bowels well.  She was scheduled for colonoscopy.  She was advised to start Linzess 145 mcg daily and to stop MiraLAX and Colace.  Colonoscopy 12/01/2021 with diverticulosis throughout the entire examined colon, single 3 mm polyp in the descending colon, otherwise normal.  Pathology revealed tubular adenoma, negative for high-grade dysplasia or carcinoma.  7-year follow-up recommended.  Today: Constipation: Linzess 145 mcg daily. Reports her eating is not well, not getting enough fiber. Sometimes every day and sometimes every other day. Stool is mushy and soft. Not having to strain. Will take Linzess in the morning and colace at night. No abdominal pain. Nausea has been getting better as well, no vomiting. Has tried metamucil in the past but she did not like it (it did not agree with her). No issues with hemorrhoids. No melena or hematochezia.   Past Medical History:  Diagnosis Date   Anxiety    Complication of anesthesia    difficult to wake up after anesthesia   Constipation    Essential  hypertension    GERD (gastroesophageal reflux disease)    Heart murmur    Hyperlipidemia    Insomnia    Obesity    Osteoarthritis    Superficial fungus infection of skin 11/25/2015   Type 2 diabetes mellitus ([place])    patient denies   Urge incontinence 11/04/2014   Vertigo    Vitamin D deficiency     Past Surgical History:  Procedure Laterality Date   ABDOMINAL HYSTERECTOMY  1989   BIOPSY  12/01/2021   Procedure: BIOPSY;  Surgeon: Daneil Dolin, MD;  [facility];  Service: Endoscopy;;   CARPAL TUNNEL RELEASE Right 06/2016   right middle finger fused; wire in right thumb   COLONOSCOPY  10/20/2002   normal rectum/left-sided diverticula   COLONOSCOPY  07/06/2011   Dr. Gala RomneyDionisio David anal canal hemorrhoids, diminutive rectal and transverse colon polyps. 2 adenoma on path. Left-sided diverticulosis.   COLONOSCOPY N/A 04/21/2016   Surgeon: Daneil Dolin, MD;  diverticulosis in the sigmoid colon and descending colon, otherwise normal exam, redundant colon.  Recommended repeat in 5 years.   COLONOSCOPY WITH PROPOFOL N/A 12/01/2021   Procedure: COLONOSCOPY WITH PROPOFOL;  Surgeon: Daneil Dolin, MD;  [facility];  Service: Endoscopy;  Laterality: N/A;  1:30pm   JOINT REPLACEMENT     PARTIAL HYSTERECTOMY  1987   SCAR DEBRIDEMENT OF TOTAL KNEE Left 06/28/2021   Procedure: Left knee open scar debridement versus patella revision;  Surgeon: Paralee Cancel, MD;  [facility]: [facility];  Service: Orthopedics;  Laterality: Left;   TOE SURGERY     second toe on both feet, bunionectomy   TOTAL HIP ARTHROPLASTY  2001/2008   multiple surgeries on right hip including replacements. Two replacements and four dislocations.    TOTAL HIP ARTHROPLASTY  2010   left   TOTAL HIP REVISION  12/25/2011   Procedure: TOTAL HIP REVISION;  Surgeon: Mauri Pole, MD;  [facility];  Service: Orthopedics;  Laterality: Right;   TOTAL KNEE ARTHROPLASTY  2008   left   TOTAL KNEE  ARTHROPLASTY  09/04/2011   Procedure: TOTAL KNEE ARTHROPLASTY;  Surgeon: Mauri Pole, MD;  [facility];  Service: Orthopedics;  Laterality: Right;   TOTAL KNEE REVISION Left 03/22/2021   Procedure: TOTAL KNEE REVISION;  Surgeon: Paralee Cancel, MD;  [facility];  Service: Orthopedics;  Laterality: Left;   TOTAL SHOULDER ARTHROPLASTY Left 10/08/2014   Procedure: LEFT TOTAL SHOULDER ARTHROPLASTY;  Surgeon: Justice Britain, MD;  Location: [place];  Service: Orthopedics;  Laterality: Left;   TOTAL SHOULDER REPLACEMENT Left 10/08/2014   dr supple    Current Outpatient Medications  Medication Sig Dispense Refill   aspirin EC 81 MG tablet Take 81 mg by mouth daily. Swallow whole.     b complex vitamins capsule Take 1 capsule by mouth daily.     CALCIUM-VITAMIN D PO Take 1 tablet by mouth daily.     docusate sodium (COLACE) 100 MG capsule Take 1 capsule (100 mg total) by mouth 2 (two) times daily. (Patient taking differently: Take 100 mg by mouth at bedtime.) 10 capsule 0   hydroxychloroquine (PLAQUENIL) 200 MG tablet Take 400 mg by mouth daily.     linaclotide (LINZESS) 145 MCG CAPS capsule Take 1 capsule (145 mcg total) by mouth daily before breakfast. 30 capsule 3   lisinopril-hydrochlorothiazide (PRINZIDE,ZESTORETIC) 20-12.5 MG per tablet Take 1 tablet by mouth daily before breakfast.     Multiple Vitamin (MULTIVITAMIN WITH MINERALS) TABS tablet Take 1 tablet by mouth daily.     Omega-3 Fatty Acids (FISH OIL) 1200 MG CAPS Take 1,200 mg by mouth daily.     zolpidem (AMBIEN) 10 MG tablet Take 10 mg by mouth at bedtime.     No current facility-administered medications for this visit.    Allergies as of 12/15/2021   (No Known Allergies)    Family History  Problem Relation Age of Onset   Heart failure Mother        Died 24   Diabetes Mother    Hypertension Mother    Early death Father    Pneumonia Father    Alcohol abuse Father    Heart disease Brother    Hypertension Brother     Diabetes Brother    Hypertension Sister    Hypertension Sister    Sickle cell trait Brother    GER disease Brother    Thyroid disease Daughter    Heart failure Daughter    Heart attack Daughter    Heart disease Maternal Aunt    Heart disease Maternal Uncle    Colon cancer Neg Hx     Social History   Socioeconomic History   Marital status: Single    Spouse name: Not on file   Number of children: 1   Years of education: Not on file   Highest education level: Not on file  Occupational History    Employer: [employer]  Tobacco Use   Smoking status: Never  Smokeless tobacco: Never  Vaping Use   Vaping Use: Never used  Substance and Sexual Activity   Alcohol use: No   Drug use: No   Sexual activity: Not on file  Other Topics Concern   Not on file  Social History Narrative   Not on file   Social Determinants of Health   Financial Resource Strain: Not on file  Food Insecurity: Not on file  Transportation Needs: Not on file  Physical Activity: Not on file  Stress: Not on file  Social Connections: Not on file     Review of Systems   Gen: Denies fever, chills, anorexia. Denies fatigue, weakness, weight loss.  CV: Denies chest pain, palpitations, syncope, peripheral edema, and claudication. Resp: Denies dyspnea at rest, cough, wheezing, coughing up blood, and pleurisy. GI: see HPI Derm: Denies rash, itching, dry skin Psych: Denies depression, anxiety, memory loss, confusion. No homicidal or suicidal ideation.  Heme: Denies bruising, bleeding, and enlarged lymph nodes.   Physical Exam   BP 102/66   Pulse 67   Temp (!) 97.2 F (36.2 C)   Ht '5\' 1"'$  (1.549 m)   Wt 218 lb 9.6 oz (99.2 kg)   BMI 41.30 kg/m   General:   Alert and oriented. No distress noted. Pleasant and cooperative.  Head:  Normocephalic and atraumatic. Eyes:  Conjuctiva clear without scleral icterus. Lungs:  Clear to auscultation bilaterally. No wheezes, rales, or rhonchi. No  distress.  Heart:  S1, S2 present without murmurs appreciated.  Abdomen:  +BS, soft, non-tender and non-distended. No rebound or guarding. No HSM or masses noted. Rectal: deferred Msk:  Symmetrical without gross deformities. Normal posture. Extremities:  Without edema. Neurologic:  Alert and  oriented x4 Psych:  Alert and cooperative. Normal mood and affect.   Assessment  [name] is a 71 y.o. female with a history of HTN, HLD, anxiety, constipation, adenomatous colon polyps presenting today for follow up post procedure and constipation.   History of adenomatous colon polyps: Prior colonoscopy in 2017 with adenomatous polyps.  Most recent colonoscopy 12/01/2021 with singular 3 mm tubular adenoma in the descending colon and diverticulosis noted throughout the entire colon.  Will be placed on recall for repeat in 7 years.  Discussed with diverticula are, and educated patient on potential complications of diverticulum including diverticular bleed and diverticulitis.  Patient educated on clinical signs and symptoms.  Chronic Idiopathic Constipation: On Linzess 145 mcg daily and taking 1 Colace capsule nightly.  She is having a bowel movement about every day or at least every other day.  Stools are soft in nature, denies difficulty straining.  She reports that nausea has improved.  No alarm symptoms present.  She feels as though she does not have an adequate diet and deftly needs to consume more fiber.  We discussed daily fiber recommendation and initiating soluble fiber (Benefiber) daily.  We will begin with 2 teaspoons daily for 2 weeks and increase to twice daily thereafter.  If this does not improve constipation discussed with the patient that we could increase the dose of Linzess in the future if needed.   PLAN   Continue Linzess 145 mcg daily, 30 minutes prior to breakfast May continue 1 Colace capsule nightly. Begin Benefiber 2 times daily for 2 weeks, increasing to twice daily  thereafter. Discussed consuming higher fiber content foods, handout provided. On recall for colonoscopy in 7 years.  Follow-up in 6 months or sooner if needed.    Venetia Night, MSN, FNP-BC,  AGACNP-BC [facility]

## 2021-12-15 ENCOUNTER — Ambulatory Visit: Payer: Medicare Other | Admitting: Gastroenterology

## 2021-12-15 ENCOUNTER — Encounter: Payer: Self-pay | Admitting: Gastroenterology

## 2021-12-15 VITALS — BP 102/66 | HR 67 | Temp 97.2°F | Ht 61.0 in | Wt 218.6 lb

## 2021-12-15 DIAGNOSIS — K5904 Chronic idiopathic constipation: Secondary | ICD-10-CM

## 2021-12-15 DIAGNOSIS — Z86010 Personal history of colonic polyps: Secondary | ICD-10-CM | POA: Diagnosis not present

## 2021-12-15 NOTE — Patient Instructions (Addendum)
For your constipation: - Continue Linzess 145 mcg daily, 30 minutes prior to breakfast. - Begin taking Benefiber 2 teaspoons daily for 2 weeks then increase to twice daily.  - I am attaching a handout regarding higher fiber foods for your reference.    We will have you follow-up in about 6 months or sooner if needed.  It was a pleasure to meet you today!  I hope you enjoy your summer!  I want to create trusting relationships with patients. If you receive a survey regarding your visit,  I greatly appreciate you taking time to fill this out on paper or through your MyChart. I value your feedback.  Venetia Night, MSN, FNP-BC, AGACNP-BC [facility]

## 2021-12-20 DIAGNOSIS — E7849 Other hyperlipidemia: Secondary | ICD-10-CM | POA: Diagnosis not present

## 2021-12-20 DIAGNOSIS — M1991 Primary osteoarthritis, unspecified site: Secondary | ICD-10-CM | POA: Diagnosis not present

## 2021-12-20 DIAGNOSIS — I10 Essential (primary) hypertension: Secondary | ICD-10-CM | POA: Diagnosis not present

## 2021-12-20 DIAGNOSIS — Z23 Encounter for immunization: Secondary | ICD-10-CM | POA: Diagnosis not present

## 2021-12-20 DIAGNOSIS — Z0001 Encounter for general adult medical examination with abnormal findings: Secondary | ICD-10-CM | POA: Diagnosis not present

## 2021-12-20 DIAGNOSIS — R7309 Other abnormal glucose: Secondary | ICD-10-CM | POA: Diagnosis not present

## 2021-12-20 DIAGNOSIS — E782 Mixed hyperlipidemia: Secondary | ICD-10-CM | POA: Diagnosis not present

## 2022-01-30 ENCOUNTER — Ambulatory Visit (HOSPITAL_COMMUNITY)
Admission: RE | Admit: 2022-01-30 | Discharge: 2022-01-30 | Disposition: A | Discharge status: Home / Self Care | Payer: Medicare Other | Source: Ambulatory Visit | Attending: Family Medicine | Admitting: Family Medicine

## 2022-01-30 DIAGNOSIS — Z1231 Encounter for screening mammogram for malignant neoplasm of breast: Secondary | ICD-10-CM | POA: Diagnosis not present

## 2022-02-28 ENCOUNTER — Telehealth: Payer: Self-pay | Admitting: *Deleted

## 2022-02-28 ENCOUNTER — Encounter: Payer: Self-pay | Admitting: *Deleted

## 2022-02-28 NOTE — Patient Instructions (Signed)
Obesity, Adult Obesity is the condition of having too much total body fat. Being overweight or obese means that your weight is greater than what is considered healthy for your body size. Obesity is determined by a measurement called BMI (body mass index). BMI is an estimate of body fat and is calculated from height and weight. For adults, a BMI of 30 or higher is considered obese. Obesity can lead to other health concerns and major illnesses, including: Stroke. Coronary artery disease (CAD). Type 2 diabetes. Some types of cancer, including cancers of the colon, breast, uterus, and gallbladder. High blood pressure (hypertension). High cholesterol. Gallbladder stones. Obesity can also contribute to: Osteoarthritis. Sleep apnea. Infertility problems. What are the causes? Common causes of this condition include: Eating daily meals that are high in calories, sugar, and fat. Drinking high amounts of sugar-sweetened beverages, such as soft drinks. Being born with genes that may make you more likely to become obese. Having a medical condition that causes obesity, including: Hypothyroidism. Polycystic ovarian syndrome (PCOS). Binge-eating disorder. Cushing syndrome. Taking certain medicines, such as steroids, antidepressants, and seizure medicines. Not being physically active (sedentary lifestyle). Not getting enough sleep. What increases the risk? The following factors may make you more likely to develop this condition: Having a family history of obesity. Living in an area with limited access to: [place], recreation centers, or sidewalks. Healthy food choices, such as grocery stores and farmers' markets. What are the signs or symptoms? The main sign of this condition is having too much body fat. How is this diagnosed? This condition is diagnosed based on: Your BMI. If you are an adult with a BMI of 30 or higher, you are considered obese. Your waist circumference. This measures the  distance around your waistline. Your skinfold thickness. Your health care provider may gently pinch a fold of your skin and measure it. You may have other tests to check for underlying conditions. How is this treated? Treatment for this condition often includes changing your lifestyle. Treatment may include some or all of the following: Dietary changes. This may include developing a healthy meal plan. Regular physical activity. This may include activity that causes your heart to beat faster (aerobic exercise) and strength training. Work with your health care provider to design an exercise program that works for you. Medicine to help you lose weight if you are unable to lose one pound a week after six weeks of healthy eating and more physical activity. Treating conditions that cause the obesity (underlying conditions). Surgery. Surgical options may include gastric banding and gastric bypass. Surgery may be done if: Other treatments have not helped to improve your condition. You have a BMI of 40 or higher. You have life-threatening health problems related to obesity. Follow these instructions at home: Eating and drinking A plate with examples of foods in a healthy diet.    Follow recommendations from your health care provider about what you eat and drink. Your health care provider may advise you to: Limit fast food, sweets, and processed snack foods. Choose low-fat options, such as low-fat milk instead of whole milk. Eat five or more servings of fruits or vegetables every day. Choose healthy foods when you eat out. Keep low-fat snacks available. Limit sugary drinks, such as soda, fruit juice, sweetened iced tea, and flavored milk. Drink enough water to keep your urine pale yellow. Do not follow a fad diet. Fad diets can be unhealthy and even dangerous. Other healthful choices include: Eat at home more often. This gives  you more control over what you eat. Learn to read food labels. This  will help you understand how much food is considered one serving. Learn what a healthy serving size is. Physical activity Exercise regularly, as told by your health care provider. Most adults should get up to 150 minutes of moderate-intensity exercise every week. Ask your health care provider what types of exercise are safe for you and how often you should exercise. Warm up and stretch before being active. Cool down and stretch after being active. Rest between periods of activity. Lifestyle Work with your health care provider and a dietitian to set a weight-loss goal that is healthy and reasonable for you. Limit your screen time. Find ways to reward yourself that do not involve food. Do not drink alcohol if: Your health care provider tells you not to drink. You are pregnant, may be pregnant, or are planning to become pregnant. If you drink alcohol: Limit how much you have to: 0-1 drink a day for women. 0-2 drinks a day for men. Know how much alcohol is in your drink. In the [place] one drink equals one 12 oz bottle of beer (355 mL), one 5 oz glass of wine (148 mL), or one 1 oz glass of hard liquor (44 mL). General instructions Keep a weight-loss journal to keep track of the food you eat and how much exercise you get. Take over-the-counter and prescription medicines only as told by your health care provider. Take vitamins and supplements only as told by your health care provider. Consider joining a support group. Your health care provider may be able to recommend a support group. Pay attention to your mental health as obesity can lead to depression or self esteem issues. Keep all follow-up visits. This is important. Contact a health care provider if: You are unable to meet your weight-loss goal after six weeks of dietary and lifestyle changes. You have trouble breathing. Summary Obesity is the condition of having too much total body fat. Being overweight or obese means that your weight  is greater than what is considered healthy for your body size. Work with your health care provider and a dietitian to set a weight-loss goal that is healthy and reasonable for you. Exercise regularly, as told by your health care provider. Ask your health care provider what types of exercise are safe for you and how often you should exercise. This information is not intended to replace advice given to you by your health care provider. Make sure you discuss any questions you have with your health care provider. Document Revised: 02/01/2021 Document Reviewed: 02/01/2021 Elsevier Patient Education  [place].

## 2022-02-28 NOTE — Patient Outreach (Signed)
  Care Coordination   Initial Visit Note   02/28/2022 Name: [name] MRN: [id number] DOB: 11-10-1950  [name] is a 71 y.o. year old female who sees Sharilyn Sites, MD for primary care. I spoke with  [name] by phone today  What matters to the patients health and wellness today?  Weight management    Goals Addressed             This Visit's Progress    Work on [place] Coordination Interventions: Advised patient to discuss with primary care provider options regarding weight management Provided verbal and/or written education to patient re: provider recommended life style modifications  Reviewed recommended dietary changes: avoid fad diets, make small/incremental dietary and exercise changes, eat at the table and avoid eating in front of the TV, plan management of cravings, monitor snacking and cravings in food diary Advised patient to discuss medications for weight management (ie. VZDGLO, Saxenda, etc) with provider Assessed social determinant of health barriers Discussed mobility and physical activity level and left knee pain as a minor barrier to increasing activity level. Patient does exercise daily and has a home gym setup. Discussed insomnia due to hx of working 3rd shift for many years.  Discussed sleep hygiene and sleep medications Discussed insufficient sleep as a barrier to weight loss         SDOH assessments and interventions completed:  Yes  SDOH Interventions Today    Flowsheet Row Most Recent Value  SDOH Interventions   Housing Interventions Intervention Not Indicated  Transportation Interventions Intervention Not Indicated        Care Coordination Interventions Activated:  Yes  Care Coordination Interventions:  Yes, provided   Follow up plan: Follow up call scheduled for 03/23/22 with Valente David, RN Care Coordinator    Encounter Outcome:  Pt. Visit Completed   Chong Sicilian, BSN, RN-BC RN Care Coordinator Direct Dial:  [phone]

## 2022-03-23 ENCOUNTER — Encounter: Payer: Medicare Other | Admitting: *Deleted

## 2022-03-23 ENCOUNTER — Encounter: Payer: Self-pay | Admitting: *Deleted

## 2022-03-23 ENCOUNTER — Ambulatory Visit: Payer: Self-pay | Admitting: *Deleted

## 2022-03-23 NOTE — Patient Instructions (Signed)
Visit Information  Thank you for taking time to visit with me today. Please don't hesitate to contact me if I can be of assistance to you before our next scheduled telephone appointment.  Following are the goals we discussed today:  Call to schedule annual wellness visit. Call [employer] and/or insurance company to inquire about silver sneakers   Our next appointment is by telephone on 12/4  Please call the care guide team at [phone] if you need to cancel or reschedule your appointment.   Please call the Suicide and Crisis Lifeline: 988 call the [place] National Suicide Prevention Lifeline: (505)316-1463 or TTY: 417 177 6288 TTY 219-881-0910) to talk to a trained counselor call 1-800-273-TALK (toll free, 24 hour hotline) call the [facility]: [phone] call 911 if you are experiencing a Mental Health or [place] or need someone to talk to.  Patient verbalizes understanding of instructions and care plan provided today and agrees to view in [place]. Active MyChart status and patient understanding of how to access instructions and care plan via MyChart confirmed with patient.     The patient has been provided with contact information for the care management team and has been advised to call with any health related questions or concerns.   Valente David, RN, MSN, [place] Care Management Care Management Coordinator [phone]

## 2022-03-23 NOTE — Patient Outreach (Addendum)
  Care Coordination   Follow Up Visit Note   03/23/2022 Name: [name] MRN: [id number] DOB: 1951/01/18  [name] is a 71 y.o. year old female who sees Sharilyn Sites, MD for primary care. I spoke with  [name] by phone today.  What matters to the patients health and wellness today?  Report weight management remains a concern.  State she had decreased from 229 pounds to 215 pounds at last office visit.  Continues to not eat breakfast as this has been her routine for several years.  She will consider protein shakes.  Denies any urgent concerns, encouraged to contact this care manager with questions.     Goals Addressed             This Visit's Progress    Work on [employer]   On track    Care Coordination Interventions: Advised patient to discuss with primary care provider options regarding weight management Provided verbal and/or written education to patient re: provider recommended life style modifications  Reviewed recommended dietary changes: avoid fad diets, make small/incremental dietary and exercise changes, eat at the table and avoid eating in front of the TV, plan management of cravings, monitor snacking and cravings in food diary Advised patient to discuss Next office visit and AWV with provider Discussed mobility and physical activity level and left knee pain as a minor barrier to increasing activity level. Patient does exercise daily and has a home gym setup.   Discussed sleep hygiene and sleep medications Discussed insufficient sleep as a barrier to weight loss Discussed using Silver Sneakers to attend water exercise classes to relieve pressure off joints during activity         SDOH assessments and interventions completed:  Yes  SDOH Interventions Today    Flowsheet Row Most Recent Value  SDOH Interventions   Food Insecurity Interventions Intervention Not Indicated  Utilities Interventions Intervention Not Indicated        Care Coordination  Interventions Activated:  Yes  Care Coordination Interventions:  Yes, provided   Follow up plan: Follow up call scheduled for 12/4    Encounter Outcome:  Pt. Visit Completed   Valente David, RN, MSN, [place] Care Management Care Management Coordinator [phone]

## 2022-04-26 DIAGNOSIS — Z23 Encounter for immunization: Secondary | ICD-10-CM | POA: Diagnosis not present

## 2022-05-29 DIAGNOSIS — E782 Mixed hyperlipidemia: Secondary | ICD-10-CM | POA: Diagnosis not present

## 2022-05-29 DIAGNOSIS — H6590 Unspecified nonsuppurative otitis media, unspecified ear: Secondary | ICD-10-CM | POA: Diagnosis not present

## 2022-05-29 DIAGNOSIS — I10 Essential (primary) hypertension: Secondary | ICD-10-CM | POA: Diagnosis not present

## 2022-05-29 DIAGNOSIS — G4700 Insomnia, unspecified: Secondary | ICD-10-CM | POA: Diagnosis not present

## 2022-05-29 DIAGNOSIS — M1991 Primary osteoarthritis, unspecified site: Secondary | ICD-10-CM | POA: Diagnosis not present

## 2022-06-12 ENCOUNTER — Ambulatory Visit: Payer: Self-pay | Admitting: *Deleted

## 2022-06-12 NOTE — Patient Outreach (Signed)
  Care Coordination   06/12/2022 Name: [name] MRN: [id number] DOB: Feb 21, 1951   Care Coordination Outreach Attempts:  An unsuccessful telephone outreach was attempted for a scheduled appointment today.  Follow Up Plan:  Additional outreach attempts will be made to offer the patient care coordination information and services.   Encounter Outcome:  No Answer   Care Coordination Interventions:  No, not indicated    Chong Sicilian, BSN, RN-BC RN Care Coordinator [place]: [phone] Main #: [phone]

## 2022-06-20 ENCOUNTER — Inpatient Hospital Stay: Payer: Medicare Other | Admitting: Gastroenterology

## 2022-06-28 DIAGNOSIS — Z96652 Presence of left artificial knee joint: Secondary | ICD-10-CM | POA: Diagnosis not present

## 2022-06-28 DIAGNOSIS — T8484XA Pain due to internal orthopedic prosthetic devices, implants and grafts, initial encounter: Secondary | ICD-10-CM | POA: Diagnosis not present

## 2022-06-28 NOTE — Progress Notes (Unsigned)
GI Office Note    Referring Provider: Sharilyn Sites, MD Primary Care Physician:  Sharilyn Sites, MD Primary Gastroenterologist: Cristopher Estimable.Rourk, MD  Date:  06/29/2022  ID:  [name], [name] [birth year], MRN [id number]   Chief Complaint   Chief Complaint  Patient presents with   Follow-up    Still constipated     History of Present Illness  [name] is a 71 y.o. female with a history of HTN, HLD, anxiety, constipation, and adenomatous colon polyps presenting today for follow-up.  Colonoscopy 12/01/2021: -Diverticulosis in the entire examined colon -Single 3 mm polyp in descending colon -Pathology revealed tubular adenoma negative for dysplasia -Advised repeat in 7 years  Last office visit 12/15/2021.  Currently taking Linzess 145 mcg daily.  Not following high-fiber diet.  Having a bowel movement sometimes every day, he sometimes every other day.  Denied any hard/firm stools or need to strain.  Taking Linzess in the morning and Colace at night.  Denies abdominal pain.  Some improvement in nausea, no vomiting.  Denies any melena or BRBPR.  Advised to continue Linzess 145 mcg daily, Colace nightly and start Benefiber 2-3 teaspoons for 2 weeks then increase to twice daily.  Follow-up in 6 months or sooner if needed.   Today: Constipation - Linzess worked well for her, did not have significant loose stools with it. Medication was to expensive given her fixed income. No abdominal pain. Right now she is using dulcolax, miralax daily, and stool softeners. Uses mag citrate as needed. Sometimes still going 1 week without a BM. Not really having to strain and does not have a lot of urgency. Did not think benefiber was helpful.   Has an exercise machine at home that she is using to stay active. Is full of arthritis. Used to work at [employer]   Needs surgery on her Left hand. Right hand took her out of work - not able to use all of her fingers. Had knee revision last year and now having  issues with her left knee and now has to have another revision in her left knee. There is concern for infection or possibly break there.   Every now and again  with spicy foods she will have some reflux and is resolved with tums.   Current Outpatient Medications  Medication Sig Dispense Refill   aspirin EC 81 MG tablet Take 81 mg by mouth daily. Swallow whole.     b complex vitamins capsule Take 1 capsule by mouth daily.     CALCIUM-VITAMIN D PO Take 1 tablet by mouth daily.     docusate sodium (COLACE) 100 MG capsule Take 1 capsule (100 mg total) by mouth 2 (two) times daily. (Patient taking differently: Take 100 mg by mouth at bedtime.) 10 capsule 0   lisinopril-hydrochlorothiazide (PRINZIDE,ZESTORETIC) 20-12.5 MG per tablet Take 1 tablet by mouth daily before breakfast.     Multiple Vitamin (MULTIVITAMIN WITH MINERALS) TABS tablet Take 1 tablet by mouth daily.     Omega-3 Fatty Acids (FISH OIL) 1200 MG CAPS Take 1,200 mg by mouth daily.     zolpidem (AMBIEN) 10 MG tablet Take 10 mg by mouth at bedtime.     hydroxychloroquine (PLAQUENIL) 200 MG tablet Take 400 mg by mouth daily. (Patient not taking: Reported on 06/29/2022)     linaclotide (LINZESS) 145 MCG CAPS capsule Take 1 capsule (145 mcg total) by mouth daily before breakfast. (Patient not taking: Reported on 06/29/2022) 30 capsule 3   No current  facility-administered medications for this visit.    Past Medical History:  Diagnosis Date   Anxiety    Complication of anesthesia    difficult to wake up after anesthesia   Constipation    Essential hypertension    GERD (gastroesophageal reflux disease)    Heart murmur    Hyperlipidemia    Insomnia    Obesity    Osteoarthritis    Superficial fungus infection of skin 11/25/2015   Type 2 diabetes mellitus ([place])    patient denies   Urge incontinence 11/04/2014   Vertigo    Vitamin D deficiency     Past Surgical History:  Procedure Laterality Date   ABDOMINAL HYSTERECTOMY  1989    BIOPSY  12/01/2021   Procedure: BIOPSY;  Surgeon: Daneil Dolin, MD;  [facility]: [facility];  Service: Endoscopy;;   CARPAL TUNNEL RELEASE Right 06/2016   right middle finger fused; wire in right thumb   COLONOSCOPY  10/20/2002   normal rectum/left-sided diverticula   COLONOSCOPY  07/06/2011   Dr. Gala RomneyDionisio David anal canal hemorrhoids, diminutive rectal and transverse colon polyps. 2 adenoma on path. Left-sided diverticulosis.   COLONOSCOPY N/A 04/21/2016   Surgeon: Daneil Dolin, MD;  diverticulosis in the sigmoid colon and descending colon, otherwise normal exam, redundant colon.  Recommended repeat in 5 years.   COLONOSCOPY WITH PROPOFOL N/A 12/01/2021   Procedure: COLONOSCOPY WITH PROPOFOL;  Surgeon: Daneil Dolin, MD;  [facility]: [facility];  Service: Endoscopy;  Laterality: N/A;  1:30pm   JOINT REPLACEMENT     PARTIAL HYSTERECTOMY  1987   SCAR DEBRIDEMENT OF TOTAL KNEE Left 06/28/2021   Procedure: Left knee open scar debridement versus patella revision;  Surgeon: Paralee Cancel, MD;  Location: [facility];  Service: Orthopedics;  Laterality: Left;   TOE SURGERY     second toe on both feet, bunionectomy   TOTAL HIP ARTHROPLASTY  2001/2008   multiple surgeries on right hip including replacements. Two replacements and four dislocations.    TOTAL HIP ARTHROPLASTY  2010   left   TOTAL HIP REVISION  12/25/2011   Procedure: TOTAL HIP REVISION;  Surgeon: Mauri Pole, MD;  Location: [facility];  Service: Orthopedics;  Laterality: Right;   TOTAL KNEE ARTHROPLASTY  2008   left   TOTAL KNEE ARTHROPLASTY  09/04/2011   Procedure: TOTAL KNEE ARTHROPLASTY;  Surgeon: Mauri Pole, MD;  Location: [facility];  Service: Orthopedics;  Laterality: Right;   TOTAL KNEE REVISION Left 03/22/2021   Procedure: TOTAL KNEE REVISION;  Surgeon: Paralee Cancel, MD;  Location: [facility];  Service: Orthopedics;  Laterality: Left;   TOTAL SHOULDER ARTHROPLASTY Left 10/08/2014   Procedure: LEFT TOTAL SHOULDER  ARTHROPLASTY;  Surgeon: Justice Britain, MD;  Location: [place];  Service: Orthopedics;  Laterality: Left;   TOTAL SHOULDER REPLACEMENT Left 10/08/2014   dr supple    Family History  Problem Relation Age of Onset   Heart failure Mother        Died 68   Diabetes Mother    Hypertension Mother    Early death Father    Pneumonia Father    Alcohol abuse Father    Heart disease Brother    Hypertension Brother    Diabetes Brother    Hypertension Sister    Hypertension Sister    Sickle cell trait Brother    GER disease Brother    Thyroid disease Daughter    Heart failure Daughter    Heart attack Daughter  Heart disease Maternal Aunt    Heart disease Maternal Uncle    Colon cancer Neg Hx     Allergies as of 06/29/2022   (No Known Allergies)    Social History   Socioeconomic History   Marital status: Single    Spouse name: Not on file   Number of children: 1   Years of education: Not on file   Highest education level: Not on file  Occupational History    Employer: [employer]  Tobacco Use   Smoking status: Never   Smokeless tobacco: Never  Vaping Use   Vaping Use: Never used  Substance and Sexual Activity   Alcohol use: No   Drug use: No   Sexual activity: Not on file  Other Topics Concern   Not on file  Social History Narrative   Not on file   Social Determinants of Health   Financial Resource Strain: Not on file  Food Insecurity: No Food Insecurity (03/23/2022)   Hunger Vital Sign    Worried About Running Out of Food in the Last Year: Never true    Ran Out of Food in the Last Year: Never true  Transportation Needs: No Transportation Needs (02/28/2022)   PRAPARE - Hydrologist (Medical): No    Lack of Transportation (Non-Medical): No  Physical Activity: Not on file  Stress: Not on file  Social Connections: Not on file     Review of Systems   Gen: Denies fever, chills, anorexia. Denies fatigue, weakness, weight loss.   CV: Denies chest pain, palpitations, syncope, peripheral edema, and claudication. Resp: Denies dyspnea at rest, cough, wheezing, coughing up blood, and pleurisy. GI: See HPI Derm: Denies rash, itching, dry skin Psych: Denies depression, anxiety, memory loss, confusion. No homicidal or suicidal ideation.  Heme: Denies bruising, bleeding, and enlarged lymph nodes.   Physical Exam   BP 122/74 (BP Location: Left Arm, Patient Position: Sitting, Cuff Size: Normal)   Pulse 74   Temp 97.7 F (36.5 C) (Temporal)   Ht '5\' 1"'$  (1.549 m)   Wt 216 lb 12.8 oz (98.3 kg)   SpO2 95%   BMI 40.96 kg/m   General:   Alert and oriented. No distress noted. Pleasant and cooperative.  Head:  Normocephalic and atraumatic. Eyes:  Conjuctiva clear without scleral icterus. Mouth:  Oral mucosa pink and moist. Good dentition. No lesions. Lungs:  Clear to auscultation bilaterally. No wheezes, rales, or rhonchi. No distress.  Heart:  S1, S2 present without murmurs appreciated.  Abdomen:  +BS, soft, non-tender and non-distended. No rebound or guarding. No HSM or masses noted. Rectal: deferred Msk:  Symmetrical without gross deformities. Normal posture. Extremities:  Without edema. Neurologic:  Alert and  oriented x4 Psych:  Alert and cooperative. Normal mood and affect.   Assessment  [name] is a 71 y.o. female with a history of HTN, HLD, anxiety, constipation, and adenomatous colon polyps presenting today for follow-up.  Chronic idiopathic constipation: Previously had improvement with Linzess however was too expensive therefore she has not been taking it recently.  Is only having about 1 bowel movement per week.  Current over-the-counter regimen consist of MiraLAX daily, daily stool softener, and Dulcolax.  Has also taken magnesium citrate a few times as well.  Reports she tried Benefiber however did not feel like it made a significant difference.  Advised that she may take MiraLAX up to twice daily and  stool softeners up to 2 or 3 times daily  while we await to get patient assistance for Linzess.  Samples of Linzess 145 mcg provided today.  Advised to increase hydration with water at least 3-4 bottles daily and increase fiber in her diet, separate handout provided.  She is update me with a progress report and if an alternative prescription is needed for constipation that would be covered by insurance we are happy to trial this.  Will plan to follow-up in 6 months, or sooner if needed.  PLAN   Linzess 145 mcg daily, additional samples provided. Will submit for patient assistance.  For now continue otc regimen with miralax, stool softeners, and dulcolax. May take miralax up to twice daily and stool softener up to twice daily if unable to obtain Linzess prescription Adequate hydration with 3-4 bottles water daily.  Increase fiber in diet.  Follow up in 6 months, or sooner if needed   Venetia Night, MSN, FNP-BC, AGACNP-BC [facility]

## 2022-06-29 ENCOUNTER — Encounter: Payer: Self-pay | Admitting: Gastroenterology

## 2022-06-29 ENCOUNTER — Ambulatory Visit: Payer: Medicare Other | Admitting: Gastroenterology

## 2022-06-29 VITALS — BP 122/74 | HR 74 | Temp 97.7°F | Ht 61.0 in | Wt 216.8 lb

## 2022-06-29 DIAGNOSIS — K5904 Chronic idiopathic constipation: Secondary | ICD-10-CM | POA: Diagnosis not present

## 2022-06-29 NOTE — Patient Instructions (Addendum)
I have provided you with some additional samples of Linzess 145 mcg capsules.  We will submit for patient assistance for you.  Please bring as proof of income by the office whenever you have a chance.  Will be open tomorrow 12/22 until 1130 and will reopen again after Christmas on Wednesday 12/27 at 8 AM.  If you run out of Linzess samples or we are unable to get assistance for you we can try other prescription management such as Trulance, Amitiza, or tenapanor to see if any of these will be covered by your insurance.  In the meantime you may continue your current over-the-counter regimen with MiraLAX, stool softeners, and Dulcolax.  Try to avoid magnesium citrate and that you have been over 1 week without a bowel movement.  May take MiraLAX up to twice daily as well as stool softener up to 2 or 3 times daily.  Follow a high-fiber diet.  I have attached a handout regarding foods that are higher in fiber.  I hope you have a wonderful Christmas and a happy new year!   We will plan to follow-up in 6 months, or sooner if needed.  Please keep me updated on the process of your patient assistance and if any change in your constipation medications are needed prior to your follow-up.  It was a pleasure to see you today. I want to create trusting relationships with patients. If you receive a survey regarding your visit,  I greatly appreciate you taking time to fill this out on paper or through your MyChart. I value your feedback.  Venetia Night, MSN, FNP-BC, AGACNP-BC [facility]

## 2022-07-13 ENCOUNTER — Telehealth: Payer: Self-pay | Admitting: *Deleted

## 2022-07-13 NOTE — Progress Notes (Signed)
  Care Coordination Note  07/13/2022 Name: [name] MRN: [id number] DOB: March 13, 1951  [name] is a 72 y.o. year old female who is a primary care patient of Sharilyn Sites, MD and is actively engaged with the care management team. I reached out to [name] by phone today to assist with re-scheduling a follow up visit with the RN Case Manager  Follow up plan: Unsuccessful telephone outreach attempt made. A HIPAA compliant phone message was left for the patient providing contact information and requesting a return call.   [place]  Direct Dial: [phone]

## 2022-07-17 NOTE — Progress Notes (Signed)
  Care Coordination  Outreach Note  07/17/2022 Name: [name] MRN: [id number] DOB: 02/04/1951   Care Coordination Outreach Attempts: A third unsuccessful outreach was attempted today to reschedule follow up with RNCM.   Follow Up Plan:  No further outreach attempts will be made at this time. We have been unable to make contact with the patient to reschedule with care coordination services  Encounter Outcome:  No Answer  [place]  Direct Dial: [phone]

## 2022-07-24 ENCOUNTER — Telehealth: Payer: Self-pay | Admitting: *Deleted

## 2022-07-24 DIAGNOSIS — M79642 Pain in left hand: Secondary | ICD-10-CM | POA: Diagnosis not present

## 2022-07-24 DIAGNOSIS — M13832 Other specified arthritis, left wrist: Secondary | ICD-10-CM | POA: Diagnosis not present

## 2022-07-24 DIAGNOSIS — G5602 Carpal tunnel syndrome, left upper limb: Secondary | ICD-10-CM | POA: Diagnosis not present

## 2022-07-24 DIAGNOSIS — G5622 Lesion of ulnar nerve, left upper limb: Secondary | ICD-10-CM | POA: Diagnosis not present

## 2022-07-24 DIAGNOSIS — M13831 Other specified arthritis, right wrist: Secondary | ICD-10-CM | POA: Diagnosis not present

## 2022-07-24 DIAGNOSIS — G5620 Lesion of ulnar nerve, unspecified upper limb: Secondary | ICD-10-CM | POA: Insufficient documentation

## 2022-07-24 NOTE — Progress Notes (Signed)
  Care Coordination Note  07/24/2022 Name: MANAIA SAMAD MRN: [id number] DOB: 1950/08/22  [name] is a 72 y.o. year old female who is a primary care patient of Sharilyn Sites, MD and is actively engaged with the care management team. I reached out to [name] by phone today to assist with re-scheduling a follow up visit with the RN Case Manager  Follow up plan: Telephone appointment with care management team member scheduled for:08/21/22  [place]: [phone]

## 2022-07-28 DIAGNOSIS — G5621 Lesion of ulnar nerve, right upper limb: Secondary | ICD-10-CM | POA: Diagnosis not present

## 2022-07-28 DIAGNOSIS — G5603 Carpal tunnel syndrome, bilateral upper limbs: Secondary | ICD-10-CM | POA: Diagnosis not present

## 2022-08-07 DIAGNOSIS — G5622 Lesion of ulnar nerve, left upper limb: Secondary | ICD-10-CM | POA: Diagnosis not present

## 2022-08-07 DIAGNOSIS — M13832 Other specified arthritis, left wrist: Secondary | ICD-10-CM | POA: Diagnosis not present

## 2022-08-07 DIAGNOSIS — G5602 Carpal tunnel syndrome, left upper limb: Secondary | ICD-10-CM | POA: Diagnosis not present

## 2022-08-07 DIAGNOSIS — M79642 Pain in left hand: Secondary | ICD-10-CM | POA: Diagnosis not present

## 2022-08-16 DIAGNOSIS — T8484XA Pain due to internal orthopedic prosthetic devices, implants and grafts, initial encounter: Secondary | ICD-10-CM | POA: Diagnosis not present

## 2022-08-16 DIAGNOSIS — Z96652 Presence of left artificial knee joint: Secondary | ICD-10-CM | POA: Diagnosis not present

## 2022-08-21 ENCOUNTER — Encounter: Payer: Self-pay | Admitting: *Deleted

## 2022-08-21 ENCOUNTER — Ambulatory Visit: Payer: Self-pay | Admitting: *Deleted

## 2022-08-21 NOTE — Patient Outreach (Signed)
  Care Coordination   Follow Up Visit Note   08/21/2022 Name: [name] MRN: [id number] DOB: [birth year]  [name] is a 72 y.o. year old female who sees Sharilyn Sites, MD for primary care. I spoke with  [name] by phone today.  What matters to the patients health and wellness today?  Weight management and knee surgery    Goals Addressed             This Visit's Progress    Work on [place] Coordination Interventions: Reviewed recommended dietary changes: avoid fad diets, make small/incremental dietary and exercise changes, eat at the table and avoid eating in front of the TV, plan management of cravings, monitor snacking and cravings in food diary Discussed mobility and physical activity level and left knee pain as a minor barrier to increasing activity level. Patient does exercise daily and has a home gym setup. Exercise is limited by left knee pain   Discussed planned Left knee surgery. She expects a call from ortho this week to set that up in March or April Discussed blood sugar and blood pressure monitoring and measurement Discussed diet and recommendations for heart healthy/carb modified diet. Patient is following this.  Discussed recent visit with PCP and upcoming appt in April. Weight is holding steady at 216lbs. Provided with [facility] telephone number and encouraged to reach out as needed         SDOH assessments and interventions completed:  Yes  SDOH Interventions Today    Flowsheet Row Most Recent Value  SDOH Interventions   Transportation Interventions Intervention Not Indicated  Physical Activity Interventions Intervention Not Indicated, Other (Comments)  [limited by knee pain. working with ortho to schedule surgery]        Care Coordination Interventions:  Yes, provided   Follow up plan: Follow up call scheduled for 09/19/22    Encounter Outcome:  Pt. Visit Completed   Chong Sicilian, BSN, RN-BC RN Care Coordinator [place]: [phone] Main #: [phone]

## 2022-09-14 DIAGNOSIS — M1991 Primary osteoarthritis, unspecified site: Secondary | ICD-10-CM | POA: Diagnosis not present

## 2022-09-14 DIAGNOSIS — M79642 Pain in left hand: Secondary | ICD-10-CM | POA: Diagnosis not present

## 2022-09-14 DIAGNOSIS — M1990 Unspecified osteoarthritis, unspecified site: Secondary | ICD-10-CM | POA: Diagnosis not present

## 2022-09-14 DIAGNOSIS — M25532 Pain in left wrist: Secondary | ICD-10-CM | POA: Diagnosis not present

## 2022-09-19 ENCOUNTER — Ambulatory Visit: Payer: Self-pay | Admitting: *Deleted

## 2022-09-19 ENCOUNTER — Encounter: Payer: Self-pay | Admitting: *Deleted

## 2022-09-21 NOTE — Patient Outreach (Signed)
Care Coordination   Follow Up Visit Note   09/19/2022 Name: [name] MRN: [id number] DOB: 11-08-1950  [name] is a 72 y.o. year old female who sees Sharilyn Sites, MD for primary care. I spoke with  [name] by phone today.  What matters to the patients health and wellness today?  Managing blood pressure, weight, knee pain, and sleep disorder    Goals Addressed             This Visit's Progress    Manage Blood Pressure   On track    Care Coordination Goals: Patient will take medications as directed and report any negative side effects to provider  Patient will use a pill box/organizer to help keep up with when to take medications Patient will monitor and record blood pressure 3 times per week and as needed and will call PCP or specialist with any readings outside of recommended range Patient will keep all recommended follow-up appointments with PCP and specialists (cardiology, nephrology, etc) Patient will take blood pressure log to PCP and specialty appointments for review Patient will follow a low sodium/DASH diet  Patient will reach out to [place] Coordinator [phone] with any care coordination or resource needs       Manage Sleep Disorder   On track    Care Coordination Goals: Patient will talk with PCP at appointment on 10/10/22 regarding insomnia and waking up with headaches Patient will consider seeing a sleep specialist and will talk with PCP about this  Patient will take Ambien as need (as prescribed) to aid in falling asleep Patient will practice good sleep hygiene (handout mailed) Patient will reach out to PCP with any new or worsening symptoms     Schedule Knee Surgery   On track    Care Coordination Goals: Patient will work with orthopedic surgeon schedulers to schedule an appointment for knee surgery in May Patient will continue to exercise as much as possible using er [place] and weights in her home gym. She will rest as needed and stop if she  has increased pain.  Patient will reach out to [place] [phone] with any scheduling barriers      Work on [place]: Patient will continue to work on weight loss strategies including portion control and exercise Patient will talk with PCP at appt on 4/2 about weight management Patient will continue to exercise as much as tolerated with consideration to knee pain  Patient will reach out to RN Care Coordinator with any resource or care coordination needs [phone]          SDOH assessments and interventions completed:  Yes  SDOH Interventions Today    Flowsheet Row Most Recent Value  SDOH Interventions   Transportation Interventions Intervention Not Indicated  Physical Activity Interventions Other (Comments)  [She does exercise several times a week with a machine but is limited by knee pain but she is active around her home and yard. Knee surgery pending]        Care Coordination Interventions:  Yes, provided  Interventions Today    Flowsheet Row Most Recent Value  Chronic Disease   Chronic disease during today's visit Hypertension (HTN), Other  [Sleep issues, knee pain, weight management]  General Interventions   General Interventions Discussed/Reviewed General Interventions Discussed, General Interventions Reviewed, Labs, Durable Medical Equipment (DME), Doctor Visits  Doctor Visits Discussed/Reviewed Doctor Visits Discussed, Doctor Visits Reviewed, PCP, Specialist  Durable Medical Equipment (DME) BP Cuff  PCP/Specialist Visits Compliance with follow-up visit  Exercise Interventions   Exercise Discussed/Reviewed Exercise Discussed, Exercise Reviewed, Physical Activity, Weight Managment  Physical Activity Discussed/Reviewed Physical Activity Discussed, Physical Activity Reviewed, Home Exercise Program (HEP)  Weight Management Weight loss  Education Interventions   Education Provided Provided Education  Provided  Verbal Education On Nutrition, Exercise, Medication, When to see the doctor  [blood pressure monitoring]  Nutrition Interventions   Nutrition Discussed/Reviewed Nutrition Reviewed  Pharmacy Interventions   Pharmacy Dicussed/Reviewed Medications and their functions       Follow up-plan: Follow up call scheduled for 10/26/22    Encounter Outcome:  Pt. Visit Completed   Chong Sicilian, BSN, RN-BC RN Care Coordinator [place]: [phone] Main #: [phone]

## 2022-10-10 DIAGNOSIS — M1991 Primary osteoarthritis, unspecified site: Secondary | ICD-10-CM | POA: Diagnosis not present

## 2022-10-10 DIAGNOSIS — Z01818 Encounter for other preprocedural examination: Secondary | ICD-10-CM | POA: Diagnosis not present

## 2022-10-10 DIAGNOSIS — I10 Essential (primary) hypertension: Secondary | ICD-10-CM | POA: Diagnosis not present

## 2022-10-10 DIAGNOSIS — E7849 Other hyperlipidemia: Secondary | ICD-10-CM | POA: Diagnosis not present

## 2022-10-10 DIAGNOSIS — E782 Mixed hyperlipidemia: Secondary | ICD-10-CM | POA: Diagnosis not present

## 2022-10-10 DIAGNOSIS — R7309 Other abnormal glucose: Secondary | ICD-10-CM | POA: Diagnosis not present

## 2022-10-10 DIAGNOSIS — G4700 Insomnia, unspecified: Secondary | ICD-10-CM | POA: Diagnosis not present

## 2022-10-12 ENCOUNTER — Telehealth: Payer: Self-pay | Admitting: *Deleted

## 2022-10-12 NOTE — Telephone Encounter (Signed)
Received approval for Linzess 141mcg. Sent copy to scan center.

## 2022-10-17 ENCOUNTER — Ambulatory Visit (INDEPENDENT_AMBULATORY_CARE_PROVIDER_SITE_OTHER): Payer: Medicare Other | Admitting: Adult Health

## 2022-10-17 ENCOUNTER — Encounter: Payer: Self-pay | Admitting: Adult Health

## 2022-10-17 VITALS — BP 125/76 | HR 77 | Ht 61.0 in | Wt 215.0 lb

## 2022-10-17 DIAGNOSIS — R3915 Urgency of urination: Secondary | ICD-10-CM

## 2022-10-17 DIAGNOSIS — Z01419 Encounter for gynecological examination (general) (routine) without abnormal findings: Secondary | ICD-10-CM

## 2022-10-17 DIAGNOSIS — Z90710 Acquired absence of both cervix and uterus: Secondary | ICD-10-CM | POA: Insufficient documentation

## 2022-10-17 DIAGNOSIS — N3941 Urge incontinence: Secondary | ICD-10-CM

## 2022-10-17 DIAGNOSIS — Z1211 Encounter for screening for malignant neoplasm of colon: Secondary | ICD-10-CM

## 2022-10-17 DIAGNOSIS — N816 Rectocele: Secondary | ICD-10-CM

## 2022-10-17 LAB — HEMOCCULT GUIAC POC 1CARD (OFFICE): Fecal Occult Blood, POC: NEGATIVE

## 2022-10-17 NOTE — Progress Notes (Signed)
Patient ID: Soyla MurphyMargie H Puls, female   DOB: February 12, 1951, 72 y.o.   MRN: [id number] History of Present Illness: [place] AltMargie is a 72 year old black female, widowed, sp hysterectomy in for a well woman gyn exam. She is having urinary urgency and urge incontinence, tried myrbetriq years ago. She is having left knee revision surgery next month, has had pain since replacement. She has arthritis in hands.   PCP is Dr Phillips OdorGolding.   Current Medications, Allergies, Past Medical History, Past Surgical History, Family History and Social History were reviewed in [facility] electronic medical record.     Review of Systems: Patient denies any headaches, hearing loss, fatigue, blurred vision, shortness of breath, chest pain, abdominal pain, problems with bowel movements(has chronic constipation), or intercourse(not active). No mood swings.  See HPI for positives.   Physical Exam:BP 125/76 (BP Location: Left Arm, Patient Position: Sitting, Cuff Size: Large)   Pulse 77   Ht 5\' 1"  (1.549 m)   Wt 215 lb (97.5 kg)   BMI 40.62 kg/m   General:  Well developed, well nourished, no acute distress Skin:  Warm and dry Neck:  Midline trachea, normal thyroid, good ROM, no lymphadenopathy,no carotid bruits heard Lungs; Clear to auscultation bilaterally Breast:  No dominant palpable mass, retraction, or nipple discharge Cardiovascular: Regular rate and rhythm Abdomen:  Soft, non tender, no hepatosplenomegaly Pelvic:  External genitalia is normal in appearance, no lesions.  The vagina is pale. Urethra has no lesions or masses. The cervix and uterus are absent.  No adnexal masses or tenderness noted.Bladder is non tender, no masses felt. Rectal: Good sphincter tone, no polyps, or hemorrhoids felt.  Hemoccult negative.+rectocele Extremities/musculoskeletal:  No swelling or varicosities noted, no clubbing or cyanosis,  Psych:  No mood changes, alert and cooperative,seems happy AA is 0 Fall risk is low    10/17/2022    1:30 PM  09/11/2019    1:40 PM 11/29/2017   11:13 AM  Depression screen PHQ 2/9  Decreased Interest 0 0 1  Down, Depressed, Hopeless 0 0 1  PHQ - 2 Score 0 0 2  Altered sleeping 0  3  Tired, decreased energy 1  1  Change in appetite 1  3  Feeling bad or failure about yourself  0  0  Trouble concentrating 0  0  Moving slowly or fidgety/restless 0  0  Suicidal thoughts 0  0  PHQ-9 Score 2  9  Difficult doing work/chores   Not difficult at all       10/17/2022    1:30 PM  GAD 7 : Generalized Anxiety Score  Nervous, Anxious, on Edge 0  Control/stop worrying 0  Worry too much - different things 1  Trouble relaxing 0  Restless 0  Easily annoyed or irritable 0  Afraid - awful might happen 0  Total GAD 7 Score 1      [place] - 10/17/22 1341       Pregnancy Intention Screening   Does the patient want to become pregnant in the next year? N/A    Does the patient's partner want to become pregnant in the next year? N/A    Would the patient like to discuss contraceptive options today? N/A      Contraception Wrap Up   Current Method Female Sterilization   hyst   End Method Female Sterilization   hyst   Contraception Counseling Provided No            Examination chaperoned by Malachy MoodJanet Young LPN  Impression and Plan: 1. Well woman exam with routine gynecological exam Physical with PCP Labs with PCP Had negative mammogram 01/30/22.  Colonoscopy per GI Follow up prn  2. Encounter for screening fecal occult blood testing Hemoccult was negative  - POCT occult blood stool  3. S/P hysterectomy  4. Urge incontinence Will refer to urologist to be evaluated  - Ambulatory referral to Urology  5. Rectocele  6. Urinary urgency Referred to urology  - Ambulatory referral to Urology

## 2022-10-26 ENCOUNTER — Ambulatory Visit: Payer: Self-pay | Admitting: *Deleted

## 2022-10-26 ENCOUNTER — Encounter: Payer: Self-pay | Admitting: *Deleted

## 2022-11-16 ENCOUNTER — Encounter: Payer: Self-pay | Admitting: Gastroenterology

## 2022-11-17 NOTE — Progress Notes (Signed)
Sent message, via epic in basket, requesting orders in epic from surgeon.  

## 2022-11-20 NOTE — Progress Notes (Addendum)
COVID Vaccine Completed:  Yes  Date of COVID positive in last 90 days:  No  PCP - Assunta Found, MD Cardiologist - Nona Dell, MD (last OV 2022)  Chest x-ray - N/A EKG - 11-24-22 Epic Stress Test - 09-16-20 Epic ECHO - 09-16-20 Epic Cardiac Cath - N/A Pacemaker/ICD device last checked: Spinal Cord Stimulator:N/A  Bowel Prep - N/A  Sleep Study - Yes, neg sleep apnea.  Patient has insomnia CPAP - No  Fasting Blood Sugar - 98 to 112 Checks Blood Sugar - 1 time a day  Last dose of GLP1 agonist-  N/A GLP1 instructions:  N/A   Last dose of SGLT-2 inhibitors-  N/A SGLT-2 instructions: N/A  Blood Thinner Instructions:  Time Aspirin Instructions:  ASA 81.  Per patient to stop 5 days before  Last Dose:  Activity level:  Can go up a flight of stairs and perform activities of daily living without stopping and without symptoms of chest pain or shortness of breath.  Anesthesia review: Murmur, DOE evaluated by cardiology, patient states that she is not having issues with dyspnea any longer.  HTN, DM  Patient denies shortness of breath, fever, cough and chest pain at PAT appointment  Patient verbalized understanding of instructions that were given to them at the PAT appointment. Patient was also instructed that they will need to review over the PAT instructions again at home before surgery.

## 2022-11-20 NOTE — Patient Instructions (Addendum)
SURGICAL WAITING ROOM VISITATION Patients having surgery or a procedure may have no more than 2 support people in the waiting area - these visitors may rotate.    Children under the age of 57 must have an adult with them who is not the patient.  If the patient needs to stay at the hospital during part of their recovery, the visitor guidelines for inpatient rooms apply. Pre-op nurse will coordinate an appropriate time for 1 support person to accompany patient in pre-op.  This support person may not rotate.    Please refer to the [facility] website for the visitor guidelines for Inpatients (after your surgery is over and you are in a regular room).       Your procedure is scheduled on: 12-07-22   Report to [facility] Main Entrance    Report to admitting at 5:15 AM   Call this number if you have problems the morning of surgery [phone]   Do not eat food :After Midnight.   After Midnight you may have the following liquids until 4:30 AM/ DAY OF SURGERY  Water Non-Citrus Juices (without pulp, NO RED-Apple, White grape, White cranberry) Black Coffee (NO MILK/CREAM OR CREAMERS, sugar ok)  Clear Tea (NO MILK/CREAM OR CREAMERS, sugar ok) regular and decaf                             Plain Jell-O (NO RED)                                           Fruit ices (not with fruit pulp, NO RED)                                     Popsicles (NO RED)                                                               Sports drinks like Gatorade (NO RED)                   The day of surgery:  Drink ONE (1) Pre-Surgery Clear G2 at 4:30 AM the morning of surgery. Drink in one sitting. Do not sip.  This drink was given to you during your hospital  pre-op appointment visit. Nothing else to drink after completing the Pre-Surgery G2.          If you have questions, please contact your surgeon's office.   FOLLOW  ANY ADDITIONAL PRE OP INSTRUCTIONS YOU RECEIVED FROM YOUR SURGEON'S OFFICE!!!      Oral Hygiene is also important to reduce your risk of infection.                                    Remember - BRUSH YOUR TEETH THE MORNING OF SURGERY WITH YOUR REGULAR TOOTHPASTE   Do NOT smoke after Midnight   Take these medicines the morning of surgery with A SIP OF WATER:   Tylenol if needed  You may not have any metal on your body including hair pins, jewelry, and body piercing             Do not wear make-up, lotions, powders, perfumes or deodorant  Do not wear nail polish including gel and S&S, artificial/acrylic nails, or any other type of covering on natural nails including finger and toenails. If you have artificial nails, gel coating, etc. that needs to be removed by a nail salon please have this removed prior to surgery or surgery may need to be canceled/ delayed if the surgeon/ anesthesia feels like they are unable to be safely monitored.   Do not shave  48 hours prior to surgery.    Do not bring valuables to the hospital. [facility] IS NOT RESPONSIBLE   FOR VALUABLES.   Contacts, dentures or bridgework may not be worn into surgery.   Bring small overnight bag day of surgery.   DO NOT BRING YOUR HOME MEDICATIONS TO THE HOSPITAL. PHARMACY WILL DISPENSE MEDICATIONS LISTED ON YOUR MEDICATION LIST TO YOU DURING YOUR ADMISSION IN THE HOSPITAL!               Please read over the following fact sheets you were given: IF YOU HAVE QUESTIONS ABOUT YOUR PRE-OP INSTRUCTIONS PLEASE CALL [phone] G[facility]  If you received a COVID test during your pre-op visit  it is requested that you wear a mask when out in public, stay away from anyone that may not be feeling well and notify your surgeon if you develop symptoms. If you test positive for Covid or have been in contact with anyone that has tested positive in the last 10 days please notify you surgeon.    Pre-operative 5 CHG Bath Instructions   You can play a key role in reducing the risk of infection  after surgery. Your skin needs to be as free of germs as possible. You can reduce the number of germs on your skin by washing with CHG (chlorhexidine gluconate) soap before surgery. CHG is an antiseptic soap that kills germs and continues to kill germs even after washing.   DO NOT use if you have an allergy to chlorhexidine/CHG or antibacterial soaps. If your skin becomes reddened or irritated, stop using the CHG and notify one of our RNs at  [phone] .   Please shower with the CHG soap starting 4 days before surgery using the following schedule:     Please keep in mind the following:  DO NOT shave, including legs and underarms, starting the day of your first shower.   You may shave your face at any point before/day of surgery.  Place clean sheets on your bed the day you start using CHG soap. Use a clean washcloth (not used since being washed) for each shower. DO NOT sleep with pets once you start using the CHG.   CHG Shower Instructions:  If you choose to wash your hair and private area, wash first with your normal shampoo/soap.  After you use shampoo/soap, rinse your hair and body thoroughly to remove shampoo/soap residue.  Turn the water OFF and apply about 3 tablespoons (45 ml) of CHG soap to a CLEAN washcloth.  Apply CHG soap ONLY FROM YOUR NECK DOWN TO YOUR TOES (washing for 3-5 minutes)  DO NOT use CHG soap on face, private areas, open wounds, or sores.  Pay special attention to the area where your surgery is being performed.  If you are having back surgery, having someone wash your back  for you may be helpful. Wait 2 minutes after CHG soap is applied, then you may rinse off the CHG soap.  Pat dry with a clean towel  Put on clean clothes/pajamas   If you choose to wear lotion, please use ONLY the CHG-compatible lotions on the back of this paper.     Additional instructions for the day of surgery: DO NOT APPLY any lotions, deodorants, cologne, or perfumes.   Put on  clean/comfortable clothes.  Brush your teeth.  Ask your nurse before applying any prescription medications to the skin.      CHG Compatible Lotions   Aveeno Moisturizing lotion  Cetaphil Moisturizing Cream  Cetaphil Moisturizing Lotion  Clairol Herbal Essence Moisturizing Lotion, Dry Skin  Clairol Herbal Essence Moisturizing Lotion, Extra Dry Skin  Clairol Herbal Essence Moisturizing Lotion, Normal Skin  Curel Age Defying Therapeutic Moisturizing Lotion with Alpha Hydroxy  Curel Extreme Care Body Lotion  Curel Soothing Hands Moisturizing Hand Lotion  Curel Therapeutic Moisturizing Cream, Fragrance-Free  Curel Therapeutic Moisturizing Lotion, Fragrance-Free  Curel Therapeutic Moisturizing Lotion, Original Formula  Eucerin Daily Replenishing Lotion  Eucerin Dry Skin Therapy Plus Alpha Hydroxy Crme  Eucerin Dry Skin Therapy Plus Alpha Hydroxy Lotion  Eucerin Original Crme  Eucerin Original Lotion  Eucerin Plus Crme Eucerin Plus Lotion  Eucerin TriLipid Replenishing Lotion  Keri Anti-Bacterial Hand Lotion  Keri Deep Conditioning Original Lotion Dry Skin Formula Softly Scented  Keri Deep Conditioning Original Lotion, Fragrance Free Sensitive Skin Formula  Keri Lotion Fast Absorbing Fragrance Free Sensitive Skin Formula  Keri Lotion Fast Absorbing Softly Scented Dry Skin Formula  Keri Original Lotion  Keri Skin Renewal Lotion Keri Silky Smooth Lotion  Keri Silky Smooth Sensitive Skin Lotion  Nivea Body Creamy Conditioning Oil  Nivea Body Extra Enriched Lotion  Nivea Body Original Lotion  Nivea Body Sheer Moisturizing Lotion Nivea Crme  Nivea Skin Firming Lotion  NutraDerm 30 Skin Lotion  NutraDerm Skin Lotion  NutraDerm Therapeutic Skin Cream  NutraDerm Therapeutic Skin Lotion  ProShield Protective Hand Cream  Provon moisturizing lotion   PATIENT SIGNATURE_________________________________  NURSE  SIGNATURE__________________________________  ________________________________________________________________________    [name]  An incentive spirometer is a tool that can help keep your lungs clear and active. This tool measures how well you are filling your lungs with each breath. Taking long deep breaths may help reverse or decrease the chance of developing breathing (pulmonary) problems (especially infection) following: A long period of time when you are unable to move or be active. BEFORE THE PROCEDURE  If the spirometer includes an indicator to show your best effort, your nurse or respiratory therapist will set it to a desired goal. If possible, sit up straight or lean slightly forward. Try not to slouch. Hold the incentive spirometer in an upright position. INSTRUCTIONS FOR USE  Sit on the edge of your bed if possible, or sit up as far as you can in bed or on a chair. Hold the incentive spirometer in an upright position. Breathe out normally. Place the mouthpiece in your mouth and seal your lips tightly around it. Breathe in slowly and as deeply as possible, raising the piston or the ball toward the top of the column. Hold your breath for 3-5 seconds or for as long as possible. Allow the piston or ball to fall to the bottom of the column. Remove the mouthpiece from your mouth and breathe out normally. Rest for a few seconds and repeat Steps 1 through 7 at least 10 times every 1-2  hours when you are awake. Take your time and take a few normal breaths between deep breaths. The spirometer may include an indicator to show your best effort. Use the indicator as a goal to work toward during each repetition. After each set of 10 deep breaths, practice coughing to be sure your lungs are clear. If you have an incision (the cut made at the time of surgery), support your incision when coughing by placing a pillow or rolled up towels firmly against it. Once you are able to get out of  bed, walk around indoors and cough well. You may stop using the incentive spirometer when instructed by your caregiver.  RISKS AND COMPLICATIONS Take your time so you do not get dizzy or light-headed. If you are in pain, you may need to take or ask for pain medication before doing incentive spirometry. It is harder to take a deep breath if you are having pain. AFTER USE Rest and breathe slowly and easily. It can be helpful to keep track of a log of your progress. Your caregiver can provide you with a simple table to help with this. If you are using the spirometer at home, follow these instructions: SEEK MEDICAL CARE IF:  You are having difficultly using the spirometer. You have trouble using the spirometer as often as instructed. Your pain medication is not giving enough relief while using the spirometer. You develop fever of 100.5 F (38.1 C) or higher. SEEK IMMEDIATE MEDICAL CARE IF:  You cough up bloody sputum that had not been present before. You develop fever of 102 F (38.9 C) or greater. You develop worsening pain at or near the incision site. MAKE SURE YOU:  Understand these instructions. Will watch your condition. Will get help right away if you are not doing well or get worse. Document Released: 11/06/2006 Document Revised: 09/18/2011 Document Reviewed: 01/07/2007 Exit[employer] Patient Information 2014 Exit[employer], [place].   ________________________________________________________________________  WHAT IS A BLOOD TRANSFUSION? Blood Transfusion Information  A transfusion is the replacement of blood or some of its parts. Blood is made up of multiple cells which provide different functions. Red blood cells carry oxygen and are used for blood loss replacement. White blood cells fight against infection. Platelets control bleeding. Plasma helps clot blood. Other blood products are available for specialized needs, such as hemophilia or other clotting disorders. BEFORE THE TRANSFUSION   Who gives blood for transfusions?  Healthy volunteers who are fully evaluated to make sure their blood is safe. This is blood bank blood. Transfusion therapy is the safest it has ever been in the practice of medicine. Before blood is taken from a donor, a complete history is taken to make sure that person has no history of diseases nor engages in risky social behavior (examples are intravenous drug use or sexual activity with multiple partners). The donor's travel history is screened to minimize risk of transmitting infections, such as malaria. The donated blood is tested for signs of infectious diseases, such as HIV and hepatitis. The blood is then tested to be sure it is compatible with you in order to minimize the chance of a transfusion reaction. If you or a relative donates blood, this is often done in anticipation of surgery and is not appropriate for emergency situations. It takes many days to process the donated blood. RISKS AND COMPLICATIONS Although transfusion therapy is very safe and saves many lives, the main dangers of transfusion include:  Getting an infectious disease. Developing a transfusion reaction. This is an allergic  reaction to something in the blood you were given. Every precaution is taken to prevent this. The decision to have a blood transfusion has been considered carefully by your caregiver before blood is given. Blood is not given unless the benefits outweigh the risks. AFTER THE TRANSFUSION Right after receiving a blood transfusion, you will usually feel much better and more energetic. This is especially true if your red blood cells have gotten low (anemic). The transfusion raises the level of the red blood cells which carry oxygen, and this usually causes an energy increase. The nurse administering the transfusion will monitor you carefully for complications. HOME CARE INSTRUCTIONS  No special instructions are needed after a transfusion. You may find your energy is  better. Speak with your caregiver about any limitations on activity for underlying diseases you may have. SEEK MEDICAL CARE IF:  Your condition is not improving after your transfusion. You develop redness or irritation at the intravenous (IV) site. SEEK IMMEDIATE MEDICAL CARE IF:  Any of the following symptoms occur over the next 12 hours: Shaking chills. You have a temperature by mouth above 102 F (38.9 C), not controlled by medicine. Chest, back, or muscle pain. People around you feel you are not acting correctly or are confused. Shortness of breath or difficulty breathing. Dizziness and fainting. You get a rash or develop hives. You have a decrease in urine output. Your urine turns a dark color or changes to pink, red, or brown. Any of the following symptoms occur over the next 10 days: You have a temperature by mouth above 102 F (38.9 C), not controlled by medicine. Shortness of breath. Weakness after normal activity. The white part of the eye turns yellow (jaundice). You have a decrease in the amount of urine or are urinating less often. Your urine turns a dark color or changes to pink, red, or brown. Document Released: 06/23/2000 Document Revised: 09/18/2011 Document Reviewed: 02/10/2008 [facility] Patient Information 2014 [place], [place].  _______________________________________________________________________

## 2022-11-24 ENCOUNTER — Encounter (HOSPITAL_COMMUNITY)
Admission: RE | Admit: 2022-11-24 | Discharge: 2022-11-24 | Disposition: A | Discharge status: Home / Self Care | Payer: Medicare Other | Source: Ambulatory Visit | Attending: Orthopedic Surgery | Admitting: Orthopedic Surgery

## 2022-11-24 ENCOUNTER — Encounter (HOSPITAL_COMMUNITY): Payer: Self-pay

## 2022-11-24 ENCOUNTER — Other Ambulatory Visit: Payer: Self-pay

## 2022-11-24 VITALS — BP 134/93 | HR 83 | Temp 98.3°F | Resp 20 | Ht 61.0 in | Wt 205.2 lb

## 2022-11-24 DIAGNOSIS — Z01818 Encounter for other preprocedural examination: Secondary | ICD-10-CM

## 2022-11-24 DIAGNOSIS — I10 Essential (primary) hypertension: Secondary | ICD-10-CM | POA: Insufficient documentation

## 2022-11-24 DIAGNOSIS — E119 Type 2 diabetes mellitus without complications: Secondary | ICD-10-CM | POA: Diagnosis not present

## 2022-11-24 DIAGNOSIS — R7303 Prediabetes: Secondary | ICD-10-CM

## 2022-11-24 LAB — CBC
HCT: 44.3 % (ref 36.0–46.0)
Hemoglobin: 14.3 g/dL (ref 12.0–15.0)
MCH: 27 pg (ref 26.0–34.0)
MCHC: 32.3 g/dL (ref 30.0–36.0)
MCV: 83.6 fL (ref 80.0–100.0)
Platelets: 302 10*3/uL (ref 150–400)
RBC: 5.3 MIL/uL — ABNORMAL HIGH (ref 3.87–5.11)
RDW: 16.4 % — ABNORMAL HIGH (ref 11.5–15.5)
WBC: 5.7 10*3/uL (ref 4.0–10.5)
nRBC: 0 % (ref 0.0–0.2)

## 2022-11-24 LAB — BASIC METABOLIC PANEL
Anion gap: 10 (ref 5–15)
BUN: 13 mg/dL (ref 8–23)
CO2: 26 mmol/L (ref 22–32)
Calcium: 10 mg/dL (ref 8.9–10.3)
Chloride: 103 mmol/L (ref 98–111)
Creatinine, Ser: 0.81 mg/dL (ref 0.44–1.00)
GFR, Estimated: >60 mL/min (ref >60)
Glucose, Bld: 94 mg/dL (ref 70–99)
Potassium: 3.7 mmol/L (ref 3.5–5.1)
Sodium: 139 mmol/L (ref 135–145)

## 2022-11-24 LAB — TYPE AND SCREEN
ABO/RH(D): O NEG
Antibody Screen: NEGATIVE

## 2022-11-24 LAB — SURGICAL PCR SCREEN
MRSA, PCR: NEGATIVE
Staphylococcus aureus: NEGATIVE

## 2022-11-24 LAB — GLUCOSE, CAPILLARY: Glucose-Capillary: 122 mg/dL — ABNORMAL HIGH (ref 70–99)

## 2022-11-27 ENCOUNTER — Other Ambulatory Visit (HOSPITAL_COMMUNITY): Payer: Medicare Other

## 2022-11-30 ENCOUNTER — Other Ambulatory Visit (HOSPITAL_COMMUNITY): Payer: Self-pay | Admitting: Student

## 2022-11-30 DIAGNOSIS — T84033A Mechanical loosening of internal left knee prosthetic joint, initial encounter: Secondary | ICD-10-CM

## 2022-12-05 ENCOUNTER — Encounter (HOSPITAL_COMMUNITY)
Admission: RE | Admit: 2022-12-05 | Discharge: 2022-12-05 | Disposition: A | Discharge status: Home / Self Care | Payer: Medicare Other | Source: Ambulatory Visit | Attending: Student | Admitting: Student

## 2022-12-05 DIAGNOSIS — Z7982 Long term (current) use of aspirin: Secondary | ICD-10-CM | POA: Diagnosis not present

## 2022-12-05 DIAGNOSIS — T84033A Mechanical loosening of internal left knee prosthetic joint, initial encounter: Secondary | ICD-10-CM | POA: Insufficient documentation

## 2022-12-05 DIAGNOSIS — Z8719 Personal history of other diseases of the digestive system: Secondary | ICD-10-CM | POA: Diagnosis not present

## 2022-12-05 DIAGNOSIS — G8918 Other acute postprocedural pain: Secondary | ICD-10-CM | POA: Diagnosis not present

## 2022-12-05 DIAGNOSIS — M25562 Pain in left knee: Secondary | ICD-10-CM | POA: Diagnosis not present

## 2022-12-05 DIAGNOSIS — Z90711 Acquired absence of uterus with remaining cervical stump: Secondary | ICD-10-CM | POA: Diagnosis not present

## 2022-12-05 DIAGNOSIS — Y831 Surgical operation with implant of artificial internal device as the cause of abnormal reaction of the patient, or of later complication, without mention of misadventure at the time of the procedure: Secondary | ICD-10-CM | POA: Diagnosis present

## 2022-12-05 DIAGNOSIS — Z96651 Presence of right artificial knee joint: Secondary | ICD-10-CM | POA: Diagnosis not present

## 2022-12-05 DIAGNOSIS — Z83719 Family history of colon polyps, unspecified: Secondary | ICD-10-CM | POA: Diagnosis not present

## 2022-12-05 DIAGNOSIS — E669 Obesity, unspecified: Secondary | ICD-10-CM | POA: Diagnosis present

## 2022-12-05 DIAGNOSIS — M1990 Unspecified osteoarthritis, unspecified site: Secondary | ICD-10-CM | POA: Diagnosis not present

## 2022-12-05 DIAGNOSIS — E119 Type 2 diabetes mellitus without complications: Secondary | ICD-10-CM | POA: Diagnosis not present

## 2022-12-05 DIAGNOSIS — Z811 Family history of alcohol abuse and dependence: Secondary | ICD-10-CM | POA: Diagnosis not present

## 2022-12-05 DIAGNOSIS — T8489XA Other specified complication of internal orthopedic prosthetic devices, implants and grafts, initial encounter: Secondary | ICD-10-CM | POA: Diagnosis not present

## 2022-12-05 DIAGNOSIS — Z833 Family history of diabetes mellitus: Secondary | ICD-10-CM | POA: Diagnosis not present

## 2022-12-05 DIAGNOSIS — Z96652 Presence of left artificial knee joint: Secondary | ICD-10-CM | POA: Diagnosis not present

## 2022-12-05 DIAGNOSIS — T84093A Other mechanical complication of internal left knee prosthesis, initial encounter: Secondary | ICD-10-CM | POA: Diagnosis not present

## 2022-12-05 DIAGNOSIS — Z832 Family history of diseases of the blood and blood-forming organs and certain disorders involving the immune mechanism: Secondary | ICD-10-CM | POA: Diagnosis not present

## 2022-12-05 DIAGNOSIS — Z6838 Body mass index (BMI) 38.0-38.9, adult: Secondary | ICD-10-CM | POA: Diagnosis not present

## 2022-12-05 DIAGNOSIS — Z8249 Family history of ischemic heart disease and other diseases of the circulatory system: Secondary | ICD-10-CM | POA: Diagnosis not present

## 2022-12-05 DIAGNOSIS — Z86010 Personal history of colonic polyps: Secondary | ICD-10-CM | POA: Diagnosis not present

## 2022-12-05 DIAGNOSIS — K219 Gastro-esophageal reflux disease without esophagitis: Secondary | ICD-10-CM | POA: Diagnosis not present

## 2022-12-05 DIAGNOSIS — E785 Hyperlipidemia, unspecified: Secondary | ICD-10-CM | POA: Diagnosis not present

## 2022-12-05 DIAGNOSIS — Z96612 Presence of left artificial shoulder joint: Secondary | ICD-10-CM | POA: Diagnosis not present

## 2022-12-05 DIAGNOSIS — Z96642 Presence of left artificial hip joint: Secondary | ICD-10-CM | POA: Diagnosis not present

## 2022-12-05 DIAGNOSIS — Z79899 Other long term (current) drug therapy: Secondary | ICD-10-CM | POA: Diagnosis not present

## 2022-12-05 DIAGNOSIS — I10 Essential (primary) hypertension: Secondary | ICD-10-CM | POA: Diagnosis not present

## 2022-12-05 MED ORDER — TECHNETIUM TC 99M MEDRONATE IV KIT
20.0000 | PACK | Freq: Once | INTRAVENOUS | Status: AC | PRN
  Administered 2022-12-05: 20.1 via INTRAVENOUS

## 2022-12-06 NOTE — H&P (Signed)
TOTAL KNEE REVISION ADMISSION H&P  Patient is being admitted for left revision total knee arthroplasty.  Subjective:  Chief Complaint:left knee pain.  HPI: [name], 72 y.o. female. She has a history of left total knee arthroplasty revision in 2022. She subsequently had open arthrotomy and scar excision later that year. She has had fairly persistent pain since this surgery. She had a negative workup for infection. She had a bone scan which indicated loosening, particularly around the tibial component.   Patient Active Problem List   Diagnosis Date Noted   S/P hysterectomy 10/17/2022   Encounter for screening fecal occult blood testing 10/17/2022   Urinary urgency 10/17/2022   Rectocele 10/17/2022   S/P revision of total knee, left 03/22/2021   Screening for colorectal cancer 11/29/2017   Well woman exam with routine gynecological exam 11/29/2017   Change in stool caliber 03/31/2016   History of adenomatous polyp of colon 03/31/2016   Superficial fungus infection of skin 11/25/2015   Urge incontinence 11/04/2014   S/P shoulder replacement 10/08/2014   Chronic idiopathic constipation 10/31/2013   S/P right TH revision 12/25/2011   S/P right TKA 09/04/2011   Constipation 06/22/2011   Rectal bleeding 06/22/2011   Family history of colonic polyps 06/22/2011   Past Medical History:  Diagnosis Date   Anxiety    Complication of anesthesia    difficult to wake up after anesthesia, itching after spinal   Constipation    Essential hypertension    GERD (gastroesophageal reflux disease)    Heart murmur    Hyperlipidemia    Insomnia    Obesity    Osteoarthritis    Superficial fungus infection of skin 11/25/2015   Type 2 diabetes mellitus (HCC)    patient denies   Urge incontinence 11/04/2014   Vertigo    Vitamin D deficiency     Past Surgical History:  Procedure Laterality Date   ABDOMINAL HYSTERECTOMY  1989   BIOPSY  12/01/2021   Procedure: BIOPSY;  Surgeon: Corbin Ade, MD;  Location: [facility];  Service: Endoscopy;;   CARPAL TUNNEL RELEASE Right 06/2016   right middle finger fused; wire in right thumb   COLONOSCOPY  10/20/2002   normal rectum/left-sided diverticula   COLONOSCOPY  07/06/2011   Dr. Jena GaussArnoldo Lenis anal canal hemorrhoids, diminutive rectal and transverse colon polyps. 2 adenoma on path. Left-sided diverticulosis.   COLONOSCOPY N/A 04/21/2016   Surgeon: Corbin Ade, MD;  diverticulosis in the sigmoid colon and descending colon, otherwise normal exam, redundant colon.  Recommended repeat in 5 years.   COLONOSCOPY WITH PROPOFOL N/A 12/01/2021   Procedure: COLONOSCOPY WITH PROPOFOL;  Surgeon: Corbin Ade, MD;  Location: [facility];  Service: Endoscopy;  Laterality: N/A;  1:30pm   JOINT REPLACEMENT     PARTIAL HYSTERECTOMY  1987   SCAR DEBRIDEMENT OF TOTAL KNEE Left 06/28/2021   Procedure: Left knee open scar debridement versus patella revision;  Surgeon: Durene Romans, MD;  Location: [facility];  Service: Orthopedics;  Laterality: Left;   TOE SURGERY     second toe on both feet, bunionectomy   TOTAL HIP ARTHROPLASTY  2001/2008   multiple surgeries on right hip including replacements. Two replacements and four dislocations.    TOTAL HIP ARTHROPLASTY  2010   left   TOTAL HIP REVISION  12/25/2011   Procedure: TOTAL HIP REVISION;  Surgeon: Shelda Pal, MD;  Location: [facility];  Service: Orthopedics;  Laterality: Right;   TOTAL KNEE ARTHROPLASTY  2008   left   TOTAL KNEE ARTHROPLASTY  09/04/2011   Procedure: TOTAL KNEE ARTHROPLASTY;  Surgeon: Shelda Pal, MD;  Location: [facility];  Service: Orthopedics;  Laterality: Right;   TOTAL KNEE REVISION Left 03/22/2021   Procedure: TOTAL KNEE REVISION;  Surgeon: Durene Romans, MD;  Location: [facility];  Service: Orthopedics;  Laterality: Left;   TOTAL SHOULDER ARTHROPLASTY Left 10/08/2014   Procedure: LEFT TOTAL SHOULDER ARTHROPLASTY;  Surgeon: Francena Hanly, MD;  Location: [facility];  Service:  Orthopedics;  Laterality: Left;   TOTAL SHOULDER REPLACEMENT Left 10/08/2014   dr supple    No current facility-administered medications for this encounter.   Current Outpatient Medications  Medication Sig Dispense Refill Last Dose   acetaminophen (TYLENOL) 325 MG tablet Take 650 mg by mouth every 6 (six) hours as needed for moderate pain.      aspirin EC 81 MG tablet Take 81 mg by mouth every other day. Swallow whole.      calcium citrate (CALCITRATE - DOSED IN MG ELEMENTAL CALCIUM) 950 (200 Ca) MG tablet Take 200 mg of elemental calcium by mouth daily.      docusate sodium (COLACE) 100 MG capsule Take 1 capsule (100 mg total) by mouth 2 (two) times daily. (Patient taking differently: Take 100 mg by mouth.) 10 capsule 0    Garlic (GARLIQUE PO) Take 1 tablet by mouth daily.      linaclotide (LINZESS) 145 MCG CAPS capsule Take 1 capsule (145 mcg total) by mouth daily before breakfast. 30 capsule 3    lisinopril-hydrochlorothiazide (PRINZIDE,ZESTORETIC) 20-12.5 MG per tablet Take 1 tablet by mouth daily before breakfast.      Multiple Vitamin (MULTIVITAMIN WITH MINERALS) TABS tablet Take 1 tablet by mouth daily.      Omega-3 Fatty Acids (FISH OIL) 1000 MG CAPS Take 1,000 mg by mouth daily.      phentermine (ADIPEX-P) 37.5 MG tablet Take 37.5 mg by mouth daily before breakfast.      polyethylene glycol (MIRALAX / GLYCOLAX) 17 g packet Take 17 g by mouth daily.      zolpidem (AMBIEN) 10 MG tablet Take 10 mg by mouth at bedtime.      No Known Allergies  Social History   Tobacco Use   Smoking status: Never   Smokeless tobacco: Never  Substance Use Topics   Alcohol use: No    Family History  Problem Relation Age of Onset   Heart failure Mother        Died 78   Diabetes Mother    Hypertension Mother    Early death Father    Pneumonia Father    Alcohol abuse Father    Heart disease Brother    Hypertension Brother    Diabetes Brother    Hypertension Sister    Hypertension Sister     Sickle cell trait Brother    GER disease Brother    Thyroid disease Daughter    Heart failure Daughter    Heart attack Daughter    Heart disease Maternal Aunt    Heart disease Maternal Uncle    Colon cancer Neg Hx        Objective:  Physical Exam Well nourished and well developed. General: Alert and oriented x3, cooperative and pleasant, no acute distress. Head: normocephalic, atraumatic, neck supple. Eyes: EOMI.  Musculoskeletal: Left knee exam: Her surgical incision remains well-healed without signs of infection specifically there is no erythema or significant warmth and no palpable effusion She has full knee  extension and flexes with pain over 100 degrees Most of her tenderness is in the leg region. She has pain along the medial aspect of the proximal tibia Stable medial and lateral collateral ligaments We do still recognize them mechanical shifting in her knee that I had evaluated surgically in terms of a revision with concerns that it was the patella that was causing the problem. The exact etiology still unknown to me. It could be polyethylene rotation related to flexion instability.   Calves soft and nontender. Motor function intact in LE. Strength 5/5 LE bilaterally. Neuro: Distal pulses 2+. Sensation to light touch intact in LE.  Vital signs in last 24 hours:    Labs:  Estimated body mass index is 38.77 kg/m as calculated from the following:   Height as of 11/24/22: 5\' 1"  (1.549 m).   Weight as of 11/24/22: 93.1 kg.  Imaging Review    Assessment/Plan:  Failed left total knee arthroplasty   The patient history, physical examination, clinical judgment of the provider and imaging studies are consistent with failed total knee of the left knee(s), previous total knee arthroplasty. Revision total knee arthroplasty is deemed medically necessary. The treatment options including medical management, injection therapy, arthroscopy and revision arthroplasty were discussed  at length. The risks and benefits of revision total knee arthroplasty were presented and reviewed. The risks due to aseptic loosening, infection, stiffness, patella tracking problems, thromboembolic complications and other imponderables were discussed. The patient acknowledged the explanation, agreed to proceed with the plan and consent was signed. Patient is being admitted for inpatient treatment for surgery, pain control, PT, OT, prophylactic antibiotics, VTE prophylaxis, progressive ambulation and ADL's and discharge planning.The patient is planning to be discharged  home.   Therapy Plans: outpatient therapy at [facility] Disposition: Home with sister (staying with patient) Planned DVT Prophylaxis: aspirin 81mg  BID DME needed: none PCP: Dr. Phillips Odor, clearance received TXA: IV Allergies: NKDA Anesthesia Concerns: **Had itching related to spinal anesthesia previously ** general BMI: 39.7 Last HgbA1c: 5.8% - not diabetic   Other: - having left shin pain which occasionally goes into the thigh - No hx of VTE or cancer - IBS-C - oxycodone, robaxin, tylenol, celebrex  Rosalene Billings, PA-C Orthopedic Surgery [facility] [place] [phone]

## 2022-12-06 NOTE — Anesthesia Preprocedure Evaluation (Signed)
Anesthesia Evaluation  Patient identified by MRN, date of birth, ID band Patient awake    Reviewed: Allergy & Precautions, NPO status , Patient's Chart, lab work & pertinent test results  History of Anesthesia Complications Negative for: history of anesthetic complications  Airway Mallampati: II  TM Distance: >3 FB Neck ROM: Full    Dental no notable dental hx. (+) Dental Advisory Given   Pulmonary neg pulmonary ROS   Pulmonary exam normal        Cardiovascular hypertension, Pt. on medications Normal cardiovascular exam     Neuro/Psych negative neurological ROS     GI/Hepatic Neg liver ROS,GERD  ,,  Endo/Other  diabetes, Type 2    Renal/GU negative Renal ROS  negative genitourinary   Musculoskeletal  (+) Arthritis ,    Abdominal   Peds  Hematology negative hematology ROS (+)   Anesthesia Other Findings   Reproductive/Obstetrics                             Anesthesia Physical Anesthesia Plan  ASA: 3  Anesthesia Plan: MAC and Bier Block and Bier Block-Lidocaine Only   Post-op Pain Management: Tylenol PO (pre-op)*, Toradol IV (intra-op)* and Regional block*   Induction: Intravenous  PONV Risk Score and Plan: 2 and Ondansetron, Dexamethasone and Propofol infusion  Airway Management Planned: Natural Airway and Simple Face Mask  Additional Equipment: None  Intra-op Plan:   Post-operative Plan:   Informed Consent: I have reviewed the patients History and Physical, chart, labs and discussed the procedure including the risks, benefits and alternatives for the proposed anesthesia with the patient or authorized representative who has indicated his/her understanding and acceptance.     Dental advisory given  Plan Discussed with: Anesthesiologist and CRNA  Anesthesia Plan Comments:         Anesthesia Quick Evaluation

## 2022-12-07 ENCOUNTER — Inpatient Hospital Stay (HOSPITAL_COMMUNITY): Payer: Medicare Other | Admitting: Anesthesiology

## 2022-12-07 ENCOUNTER — Other Ambulatory Visit: Payer: Self-pay

## 2022-12-07 ENCOUNTER — Encounter (HOSPITAL_COMMUNITY)
Admission: RE | Disposition: A | Discharge status: Home / Self Care | Payer: Self-pay | Source: Home / Self Care | Attending: Orthopedic Surgery

## 2022-12-07 ENCOUNTER — Inpatient Hospital Stay (HOSPITAL_COMMUNITY): Payer: Medicare Other | Admitting: Physician Assistant

## 2022-12-07 ENCOUNTER — Inpatient Hospital Stay (HOSPITAL_COMMUNITY)
Admission: RE | Admit: 2022-12-07 | Discharge: 2022-12-09 | DRG: 468 | Disposition: A | Discharge status: Home / Self Care | Payer: Medicare Other | Attending: Orthopedic Surgery | Admitting: Orthopedic Surgery

## 2022-12-07 ENCOUNTER — Encounter (HOSPITAL_COMMUNITY): Payer: Self-pay | Admitting: Orthopedic Surgery

## 2022-12-07 DIAGNOSIS — T84033A Mechanical loosening of internal left knee prosthetic joint, initial encounter: Secondary | ICD-10-CM | POA: Diagnosis present

## 2022-12-07 DIAGNOSIS — Z833 Family history of diabetes mellitus: Secondary | ICD-10-CM

## 2022-12-07 DIAGNOSIS — Z96642 Presence of left artificial hip joint: Secondary | ICD-10-CM | POA: Diagnosis present

## 2022-12-07 DIAGNOSIS — I10 Essential (primary) hypertension: Secondary | ICD-10-CM | POA: Diagnosis present

## 2022-12-07 DIAGNOSIS — K219 Gastro-esophageal reflux disease without esophagitis: Secondary | ICD-10-CM | POA: Diagnosis present

## 2022-12-07 DIAGNOSIS — M1990 Unspecified osteoarthritis, unspecified site: Secondary | ICD-10-CM | POA: Diagnosis present

## 2022-12-07 DIAGNOSIS — Z90711 Acquired absence of uterus with remaining cervical stump: Secondary | ICD-10-CM | POA: Diagnosis not present

## 2022-12-07 DIAGNOSIS — Z8719 Personal history of other diseases of the digestive system: Secondary | ICD-10-CM | POA: Diagnosis not present

## 2022-12-07 DIAGNOSIS — Z811 Family history of alcohol abuse and dependence: Secondary | ICD-10-CM

## 2022-12-07 DIAGNOSIS — Z6838 Body mass index (BMI) 38.0-38.9, adult: Secondary | ICD-10-CM

## 2022-12-07 DIAGNOSIS — T8489XA Other specified complication of internal orthopedic prosthetic devices, implants and grafts, initial encounter: Secondary | ICD-10-CM | POA: Diagnosis present

## 2022-12-07 DIAGNOSIS — Z79899 Other long term (current) drug therapy: Secondary | ICD-10-CM | POA: Diagnosis not present

## 2022-12-07 DIAGNOSIS — Y831 Surgical operation with implant of artificial internal device as the cause of abnormal reaction of the patient, or of later complication, without mention of misadventure at the time of the procedure: Secondary | ICD-10-CM | POA: Diagnosis present

## 2022-12-07 DIAGNOSIS — Z86010 Personal history of colonic polyps: Secondary | ICD-10-CM | POA: Diagnosis not present

## 2022-12-07 DIAGNOSIS — E119 Type 2 diabetes mellitus without complications: Secondary | ICD-10-CM

## 2022-12-07 DIAGNOSIS — Z8249 Family history of ischemic heart disease and other diseases of the circulatory system: Secondary | ICD-10-CM

## 2022-12-07 DIAGNOSIS — Z96651 Presence of right artificial knee joint: Secondary | ICD-10-CM | POA: Diagnosis present

## 2022-12-07 DIAGNOSIS — Z83719 Family history of colon polyps, unspecified: Secondary | ICD-10-CM | POA: Diagnosis not present

## 2022-12-07 DIAGNOSIS — E785 Hyperlipidemia, unspecified: Secondary | ICD-10-CM | POA: Diagnosis present

## 2022-12-07 DIAGNOSIS — Z7982 Long term (current) use of aspirin: Secondary | ICD-10-CM | POA: Diagnosis not present

## 2022-12-07 DIAGNOSIS — Z96652 Presence of left artificial knee joint: Principal | ICD-10-CM

## 2022-12-07 DIAGNOSIS — Z832 Family history of diseases of the blood and blood-forming organs and certain disorders involving the immune mechanism: Secondary | ICD-10-CM

## 2022-12-07 DIAGNOSIS — Z96612 Presence of left artificial shoulder joint: Secondary | ICD-10-CM | POA: Diagnosis present

## 2022-12-07 DIAGNOSIS — E669 Obesity, unspecified: Secondary | ICD-10-CM | POA: Diagnosis present

## 2022-12-07 DIAGNOSIS — T84093A Other mechanical complication of internal left knee prosthesis, initial encounter: Secondary | ICD-10-CM | POA: Diagnosis not present

## 2022-12-07 HISTORY — PX: OTHER SURGICAL HISTORY: SHX169

## 2022-12-07 HISTORY — PX: TOTAL KNEE REVISION: SHX996

## 2022-12-07 LAB — GLUCOSE, CAPILLARY: Glucose-Capillary: 124 mg/dL — ABNORMAL HIGH (ref 70–99)

## 2022-12-07 SURGERY — TOTAL KNEE REVISION
Anesthesia: General | Site: Knee | Laterality: Left

## 2022-12-07 MED ORDER — SUGAMMADEX SODIUM 200 MG/2ML IV SOLN
INTRAVENOUS | Status: DC | PRN
  Administered 2022-12-07: 200 mg via INTRAVENOUS

## 2022-12-07 MED ORDER — HYDROMORPHONE HCL 1 MG/ML IJ SOLN: 0.5000 mg | INTRAMUSCULAR | Status: DC | PRN

## 2022-12-07 MED ORDER — MIDAZOLAM HCL 5 MG/5ML IJ SOLN
INTRAMUSCULAR | Status: DC | PRN
  Administered 2022-12-07: 2 mg via INTRAVENOUS

## 2022-12-07 MED ORDER — SODIUM CHLORIDE (PF) 0.9 % IJ SOLN
INTRAMUSCULAR | Status: DC | PRN
  Administered 2022-12-07: 30 mL

## 2022-12-07 MED ORDER — VANCOMYCIN HCL 1000 MG IV SOLR
INTRAVENOUS | Status: AC
  Filled 2022-12-07: qty 20

## 2022-12-07 MED ORDER — DEXAMETHASONE SODIUM PHOSPHATE 10 MG/ML IJ SOLN
10.0000 mg | Freq: Once | INTRAMUSCULAR | Status: AC
  Administered 2022-12-08: 10 mg via INTRAVENOUS
  Filled 2022-12-07: qty 1

## 2022-12-07 MED ORDER — FENTANYL CITRATE (PF) 100 MCG/2ML IJ SOLN
INTRAMUSCULAR | Status: DC | PRN
  Administered 2022-12-07 (×4): 50 ug via INTRAVENOUS

## 2022-12-07 MED ORDER — KETOROLAC TROMETHAMINE 30 MG/ML IJ SOLN
INTRAMUSCULAR | Status: AC
  Filled 2022-12-07: qty 1

## 2022-12-07 MED ORDER — ZOLPIDEM TARTRATE 5 MG PO TABS
5.0000 mg | ORAL_TABLET | Freq: Every day | ORAL | Status: DC
  Administered 2022-12-07 – 2022-12-08 (×2): 5 mg via ORAL
  Filled 2022-12-07 (×2): qty 1

## 2022-12-07 MED ORDER — PROPOFOL 1000 MG/100ML IV EMUL
INTRAVENOUS | Status: AC
  Filled 2022-12-07: qty 100

## 2022-12-07 MED ORDER — OXYCODONE HCL 5 MG PO TABS
10.0000 mg | ORAL_TABLET | ORAL | Status: DC | PRN
  Administered 2022-12-07: 10 mg via ORAL
  Filled 2022-12-07: qty 2

## 2022-12-07 MED ORDER — ROPIVACAINE HCL 7.5 MG/ML IJ SOLN
INTRAMUSCULAR | Status: DC | PRN
  Administered 2022-12-07: 20 mL via PERINEURAL

## 2022-12-07 MED ORDER — 0.9 % SODIUM CHLORIDE (POUR BTL) OPTIME
TOPICAL | Status: DC | PRN
  Administered 2022-12-07: 1000 mL

## 2022-12-07 MED ORDER — METHOCARBAMOL 500 MG PO TABS
500.0000 mg | ORAL_TABLET | Freq: Four times a day (QID) | ORAL | Status: DC | PRN
  Administered 2022-12-07 – 2022-12-08 (×3): 500 mg via ORAL
  Filled 2022-12-07 (×4): qty 1

## 2022-12-07 MED ORDER — LISINOPRIL 20 MG PO TABS
20.0000 mg | ORAL_TABLET | Freq: Every day | ORAL | Status: DC
  Administered 2022-12-07 – 2022-12-09 (×3): 20 mg via ORAL
  Filled 2022-12-07 (×3): qty 1

## 2022-12-07 MED ORDER — ASPIRIN 81 MG PO CHEW
81.0000 mg | CHEWABLE_TABLET | Freq: Two times a day (BID) | ORAL | Status: DC
  Administered 2022-12-07 – 2022-12-09 (×4): 81 mg via ORAL
  Filled 2022-12-07 (×4): qty 1

## 2022-12-07 MED ORDER — ONDANSETRON HCL 4 MG/2ML IJ SOLN
INTRAMUSCULAR | Status: DC | PRN
  Administered 2022-12-07: 4 mg via INTRAVENOUS

## 2022-12-07 MED ORDER — HYDROCHLOROTHIAZIDE 12.5 MG PO TABS
12.5000 mg | ORAL_TABLET | Freq: Every day | ORAL | Status: DC
  Administered 2022-12-07 – 2022-12-09 (×3): 12.5 mg via ORAL
  Filled 2022-12-07 (×3): qty 1

## 2022-12-07 MED ORDER — METHOCARBAMOL 500 MG IVPB - SIMPLE MED
500.0000 mg | Freq: Four times a day (QID) | INTRAVENOUS | Status: DC | PRN
  Filled 2022-12-07: qty 55

## 2022-12-07 MED ORDER — FENTANYL CITRATE (PF) 100 MCG/2ML IJ SOLN
INTRAMUSCULAR | Status: AC
  Filled 2022-12-07: qty 2

## 2022-12-07 MED ORDER — MIDAZOLAM HCL 2 MG/2ML IJ SOLN
INTRAMUSCULAR | Status: AC
  Filled 2022-12-07: qty 2

## 2022-12-07 MED ORDER — PHENYLEPHRINE HCL-NACL 20-0.9 MG/250ML-% IV SOLN
INTRAVENOUS | Status: AC
  Filled 2022-12-07: qty 250

## 2022-12-07 MED ORDER — HYDROMORPHONE HCL 1 MG/ML IJ SOLN
INTRAMUSCULAR | Status: DC | PRN
  Administered 2022-12-07 (×4): .5 mg via INTRAVENOUS

## 2022-12-07 MED ORDER — AMISULPRIDE (ANTIEMETIC) 5 MG/2ML IV SOLN: 10.0000 mg | Freq: Once | INTRAVENOUS | Status: DC | PRN

## 2022-12-07 MED ORDER — PROPOFOL 10 MG/ML IV BOLUS
INTRAVENOUS | Status: DC | PRN
  Administered 2022-12-07: 70 mg via INTRAVENOUS
  Administered 2022-12-07: 110 mg via INTRAVENOUS

## 2022-12-07 MED ORDER — FENTANYL CITRATE PF 50 MCG/ML IJ SOSY: 25.0000 ug | PREFILLED_SYRINGE | INTRAMUSCULAR | Status: DC | PRN

## 2022-12-07 MED ORDER — ACETAMINOPHEN 500 MG PO TABS
1000.0000 mg | ORAL_TABLET | Freq: Once | ORAL | Status: DC
  Filled 2022-12-07: qty 2

## 2022-12-07 MED ORDER — POLYETHYLENE GLYCOL 3350 17 G PO PACK
17.0000 g | PACK | Freq: Two times a day (BID) | ORAL | Status: DC
  Administered 2022-12-07 – 2022-12-09 (×4): 17 g via ORAL
  Filled 2022-12-07 (×5): qty 1

## 2022-12-07 MED ORDER — OXYCODONE HCL 5 MG PO TABS
5.0000 mg | ORAL_TABLET | ORAL | Status: DC | PRN
  Administered 2022-12-08 – 2022-12-09 (×2): 10 mg via ORAL
  Filled 2022-12-07 (×2): qty 2

## 2022-12-07 MED ORDER — BUPIVACAINE HCL 0.25 % IJ SOLN
INTRAMUSCULAR | Status: AC
  Filled 2022-12-07: qty 1

## 2022-12-07 MED ORDER — LACTATED RINGERS IV SOLN: INTRAVENOUS | Status: DC

## 2022-12-07 MED ORDER — BISACODYL 10 MG RE SUPP: 10.0000 mg | Freq: Every day | RECTAL | Status: DC | PRN

## 2022-12-07 MED ORDER — ONDANSETRON HCL 4 MG/2ML IJ SOLN
INTRAMUSCULAR | Status: AC
  Filled 2022-12-07: qty 2

## 2022-12-07 MED ORDER — BUPIVACAINE-EPINEPHRINE 0.25% -1:200000 IJ SOLN
INTRAMUSCULAR | Status: DC | PRN
  Administered 2022-12-07: 30 mL

## 2022-12-07 MED ORDER — SODIUM CHLORIDE 0.9 % IR SOLN
Status: DC | PRN
  Administered 2022-12-07: 1000 mL

## 2022-12-07 MED ORDER — LINACLOTIDE 145 MCG PO CAPS
145.0000 ug | ORAL_CAPSULE | Freq: Every day | ORAL | Status: DC
  Administered 2022-12-08 – 2022-12-09 (×2): 145 ug via ORAL
  Filled 2022-12-07 (×2): qty 1

## 2022-12-07 MED ORDER — TRANEXAMIC ACID-NACL 1000-0.7 MG/100ML-% IV SOLN
1000.0000 mg | Freq: Once | INTRAVENOUS | Status: AC
  Administered 2022-12-07: 1000 mg via INTRAVENOUS
  Filled 2022-12-07: qty 100

## 2022-12-07 MED ORDER — DEXAMETHASONE SODIUM PHOSPHATE 10 MG/ML IJ SOLN: 8.0000 mg | Freq: Once | INTRAMUSCULAR | Status: DC

## 2022-12-07 MED ORDER — METOCLOPRAMIDE HCL 5 MG PO TABS: 5.0000 mg | ORAL_TABLET | Freq: Three times a day (TID) | ORAL | Status: DC | PRN

## 2022-12-07 MED ORDER — SODIUM CHLORIDE (PF) 0.9 % IJ SOLN
INTRAMUSCULAR | Status: AC
  Filled 2022-12-07: qty 50

## 2022-12-07 MED ORDER — DOCUSATE SODIUM 100 MG PO CAPS
100.0000 mg | ORAL_CAPSULE | Freq: Two times a day (BID) | ORAL | Status: DC
  Administered 2022-12-07 – 2022-12-09 (×5): 100 mg via ORAL
  Filled 2022-12-07 (×5): qty 1

## 2022-12-07 MED ORDER — SODIUM CHLORIDE 0.9 % IV SOLN: INTRAVENOUS | Status: DC

## 2022-12-07 MED ORDER — CHLORHEXIDINE GLUCONATE 0.12 % MT SOLN
15.0000 mL | Freq: Once | OROMUCOSAL | Status: AC
  Administered 2022-12-07: 15 mL via OROMUCOSAL

## 2022-12-07 MED ORDER — ROCURONIUM BROMIDE 100 MG/10ML IV SOLN
INTRAVENOUS | Status: DC | PRN
  Administered 2022-12-07: 80 mg via INTRAVENOUS

## 2022-12-07 MED ORDER — DEXAMETHASONE SODIUM PHOSPHATE 10 MG/ML IJ SOLN
INTRAMUSCULAR | Status: AC
  Filled 2022-12-07: qty 1

## 2022-12-07 MED ORDER — LIDOCAINE HCL (PF) 2 % IJ SOLN
INTRAMUSCULAR | Status: AC
  Filled 2022-12-07: qty 5

## 2022-12-07 MED ORDER — ONDANSETRON HCL 4 MG/2ML IJ SOLN: 4.0000 mg | Freq: Four times a day (QID) | INTRAMUSCULAR | Status: DC | PRN

## 2022-12-07 MED ORDER — HYDROMORPHONE HCL 2 MG/ML IJ SOLN
INTRAMUSCULAR | Status: AC
  Filled 2022-12-07: qty 1

## 2022-12-07 MED ORDER — CEFAZOLIN SODIUM-DEXTROSE 2-4 GM/100ML-% IV SOLN
2.0000 g | INTRAVENOUS | Status: AC
  Administered 2022-12-07: 2 g via INTRAVENOUS
  Filled 2022-12-07: qty 100

## 2022-12-07 MED ORDER — TOBRAMYCIN SULFATE 1.2 G IJ SOLR
INTRAMUSCULAR | Status: AC
  Filled 2022-12-07: qty 1.2

## 2022-12-07 MED ORDER — EPHEDRINE SULFATE (PRESSORS) 50 MG/ML IJ SOLN
INTRAMUSCULAR | Status: DC | PRN
  Administered 2022-12-07: 5 mg via INTRAVENOUS

## 2022-12-07 MED ORDER — CEFAZOLIN SODIUM-DEXTROSE 2-4 GM/100ML-% IV SOLN
2.0000 g | Freq: Four times a day (QID) | INTRAVENOUS | Status: AC
  Administered 2022-12-07 (×2): 2 g via INTRAVENOUS
  Filled 2022-12-07 (×2): qty 100

## 2022-12-07 MED ORDER — STERILE WATER FOR IRRIGATION IR SOLN
Status: DC | PRN
  Administered 2022-12-07: 2000 mL

## 2022-12-07 MED ORDER — METOCLOPRAMIDE HCL 5 MG/ML IJ SOLN: 5.0000 mg | Freq: Three times a day (TID) | INTRAMUSCULAR | Status: DC | PRN

## 2022-12-07 MED ORDER — DEXAMETHASONE SODIUM PHOSPHATE 10 MG/ML IJ SOLN
INTRAMUSCULAR | Status: DC | PRN
  Administered 2022-12-07 (×2): 5 mg

## 2022-12-07 MED ORDER — PHENOL 1.4 % MT LIQD: 1.0000 | OROMUCOSAL | Status: DC | PRN

## 2022-12-07 MED ORDER — ORAL CARE MOUTH RINSE: 15.0000 mL | Freq: Once | OROMUCOSAL | Status: AC

## 2022-12-07 MED ORDER — LISINOPRIL-HYDROCHLOROTHIAZIDE 20-12.5 MG PO TABS: 1.0000 | ORAL_TABLET | Freq: Every day | ORAL | Status: DC

## 2022-12-07 MED ORDER — TRANEXAMIC ACID-NACL 1000-0.7 MG/100ML-% IV SOLN
1000.0000 mg | INTRAVENOUS | Status: AC
  Administered 2022-12-07: 1000 mg via INTRAVENOUS
  Filled 2022-12-07: qty 100

## 2022-12-07 MED ORDER — KETOROLAC TROMETHAMINE 30 MG/ML IJ SOLN
INTRAMUSCULAR | Status: DC | PRN
  Administered 2022-12-07: 30 mg

## 2022-12-07 MED ORDER — POVIDONE-IODINE 10 % EX SWAB: 2.0000 | Freq: Once | CUTANEOUS | Status: DC

## 2022-12-07 MED ORDER — PROMETHAZINE HCL 25 MG/ML IJ SOLN: 6.2500 mg | INTRAMUSCULAR | Status: DC | PRN

## 2022-12-07 MED ORDER — EPINEPHRINE PF 1 MG/ML IJ SOLN
INTRAMUSCULAR | Status: AC
  Filled 2022-12-07: qty 1

## 2022-12-07 MED ORDER — LIDOCAINE HCL (PF) 2 % IJ SOLN
INTRAMUSCULAR | Status: DC | PRN
  Administered 2022-12-07: 60 mg via INTRADERMAL

## 2022-12-07 MED ORDER — ACETAMINOPHEN 500 MG PO TABS
1000.0000 mg | ORAL_TABLET | Freq: Four times a day (QID) | ORAL | Status: DC
  Administered 2022-12-07 – 2022-12-09 (×7): 1000 mg via ORAL
  Filled 2022-12-07 (×7): qty 2

## 2022-12-07 MED ORDER — ROCURONIUM BROMIDE 10 MG/ML (PF) SYRINGE
PREFILLED_SYRINGE | INTRAVENOUS | Status: AC
  Filled 2022-12-07: qty 10

## 2022-12-07 MED ORDER — DIPHENHYDRAMINE HCL 12.5 MG/5ML PO ELIX: 12.5000 mg | ORAL_SOLUTION | ORAL | Status: DC | PRN

## 2022-12-07 MED ORDER — MENTHOL 3 MG MT LOZG
1.0000 | LOZENGE | OROMUCOSAL | Status: DC | PRN
  Administered 2022-12-09: 3 mg via ORAL
  Filled 2022-12-07: qty 9

## 2022-12-07 MED ORDER — ONDANSETRON HCL 4 MG PO TABS: 4.0000 mg | ORAL_TABLET | Freq: Four times a day (QID) | ORAL | Status: DC | PRN

## 2022-12-07 SURGICAL SUPPLY — 47 items
ADH SKN CLS APL DERMABOND .7 (GAUZE/BANDAGES/DRESSINGS) ×1
BLADE SAW SGTL 13.0X1.19X90.0M (BLADE) ×1 IMPLANT
BLADE SAW SGTL 81X20 HD (BLADE) ×1 IMPLANT
BLADE SURG SZ10 CARB STEEL (BLADE) ×2 IMPLANT
BNDG CMPR 5X62 HK CLSR LF (GAUZE/BANDAGES/DRESSINGS) ×1
BNDG ELASTIC 6INX 5YD STR LF (GAUZE/BANDAGES/DRESSINGS) ×1 IMPLANT
BRUSH FEMORAL CANAL (MISCELLANEOUS) IMPLANT
BSPLAT TIB 4 CMNT REV ROT PLAT (Knees) ×1 IMPLANT
CEMENT HV SMART SET (Cement) IMPLANT
CEMENT RESTRICTOR DEPUY SZ 4 (Cement) IMPLANT
COVER SURGICAL LIGHT HANDLE (MISCELLANEOUS) ×1 IMPLANT
CUFF TOURN SGL QUICK 34 (TOURNIQUET CUFF) ×1
CUFF TRNQT CYL 34X4.125X (TOURNIQUET CUFF) ×1 IMPLANT
DERMABOND ADVANCED .7 DNX12 (GAUZE/BANDAGES/DRESSINGS) ×1 IMPLANT
DRAPE INCISE IOBAN 66X45 STRL (DRAPES) ×1 IMPLANT
DRAPE U-SHAPE 47X51 STRL (DRAPES) ×1 IMPLANT
DRSG AQUACEL AG ADV 3.5X14 (GAUZE/BANDAGES/DRESSINGS) IMPLANT
DURAPREP 26ML APPLICATOR (WOUND CARE) ×2 IMPLANT
ELECT REM PT RETURN 15FT ADLT (MISCELLANEOUS) ×1 IMPLANT
GLOVE BIO SURGEON STRL SZ 6 (GLOVE) ×1 IMPLANT
GLOVE BIOGEL PI IND STRL 6.5 (GLOVE) ×1 IMPLANT
GLOVE BIOGEL PI IND STRL 7.5 (GLOVE) ×1 IMPLANT
GLOVE ORTHO TXT STRL SZ7.5 (GLOVE) ×2 IMPLANT
GOWN STRL REUS W/ TWL LRG LVL3 (GOWN DISPOSABLE) ×2 IMPLANT
GOWN STRL REUS W/TWL LRG LVL3 (GOWN DISPOSABLE) ×2
HANDPIECE INTERPULSE COAX TIP (DISPOSABLE) ×1
INSERT TIB CMT ATTUNE RP SZ4 (Knees) IMPLANT
INSERT TIB CRS ATTUNE SZ4 20 (Insert) IMPLANT
KIT TURNOVER KIT A (KITS) IMPLANT
MANIFOLD NEPTUNE II (INSTRUMENTS) ×1 IMPLANT
NDL SAFETY ECLIP 18X1.5 (MISCELLANEOUS) IMPLANT
NS IRRIG 1000ML POUR BTL (IV SOLUTION) ×1 IMPLANT
PACK TOTAL KNEE CUSTOM (KITS) ×1 IMPLANT
PROTECTOR NERVE ULNAR (MISCELLANEOUS) ×1 IMPLANT
SET HNDPC FAN SPRY TIP SCT (DISPOSABLE) ×1 IMPLANT
SET PAD KNEE POSITIONER (MISCELLANEOUS) ×1 IMPLANT
SLEEVE TIB ATTUNE FP 69 (Knees) IMPLANT
STAPLER VISISTAT 35W (STAPLE) IMPLANT
STEM CMT REV 14X130 (Knees) IMPLANT
SUT MNCRL AB 3-0 PS2 18 (SUTURE) ×1 IMPLANT
SUT STRATAFIX PDS+ 0 24IN (SUTURE) ×1 IMPLANT
SUT VIC AB 1 CT1 36 (SUTURE) ×1 IMPLANT
SUT VIC AB 2-0 CT1 27 (SUTURE) ×3
SUT VIC AB 2-0 CT1 TAPERPNT 27 (SUTURE) ×3 IMPLANT
TUBE SUCTION HIGH CAP CLEAR NV (SUCTIONS) ×1 IMPLANT
WATER STERILE IRR 1000ML POUR (IV SOLUTION) ×1 IMPLANT
WRAP KNEE MAXI GEL POST OP (GAUZE/BANDAGES/DRESSINGS) ×1 IMPLANT

## 2022-12-07 NOTE — Progress Notes (Signed)
Physical Therapy Treatment Patient Details Name: [name] MRN: [id number] DOB: March 11, 1951 Today's Date: 12/07/2022   History of Present Illness Pt s/p tibial revision of L TKR and with hx of DM, Vertigo, L TSR, bil TKR (multiple L TKR revisions) and R THR    PT Comments    Pt continues cooperative but limited this date by fatigue.  Pt assisted up from [facility] to ambulate limited distance to sit up in recliner.  Good follow through with PWB noted.   Recommendations for follow up therapy are one component of a multi-disciplinary discharge planning process, led by the attending physician.  Recommendations may be updated based on patient status, additional functional criteria and insurance authorization.  Follow Up Recommendations       Assistance Recommended at Discharge Set up Supervision/Assistance  Patient can return home with the following     Equipment Recommendations  None recommended by PT    Recommendations for Other Services       Precautions / Restrictions Precautions Precautions: Knee;Fall Restrictions Weight Bearing Restrictions: Yes LLE Weight Bearing: Partial weight bearing LLE Partial Weight Bearing Percentage or Pounds: 50%     Mobility  Bed Mobility Overal bed mobility: Needs Assistance Bed Mobility: Supine to Sit     Supine to sit: Min assist     General bed mobility comments: cues for sequence and use of L LE to self assist    Transfers Overall transfer level: Needs assistance Equipment used: Rolling walker (2 wheels) Transfers: Sit to/from Stand, Bed to chair/wheelchair/BSC Sit to Stand: Min assist, Mod assist   Step pivot transfers: Min assist, Mod assist       General transfer comment: cues for LE management and use of UEs to self assist; step pvt bed to [facility]    Ambulation/Gait Ambulation/Gait assistance: Min assist, Mod assist Gait Distance (Feet): 6 Feet Assistive device: Rolling walker (2 wheels) Gait Pattern/deviations: Step-to  pattern, Decreased step length - right, Decreased step length - left, Shuffle, Trunk flexed, Antalgic Gait velocity: decr     General Gait Details: cues for sequence, posture, position from RW and PWB   Stairs             Wheelchair Mobility    Modified Rankin (Stroke Patients Only)       Balance Overall balance assessment: Needs assistance Sitting-balance support: No upper extremity supported, Feet supported Sitting balance-Leahy Scale: Fair     Standing balance support: Bilateral upper extremity supported Standing balance-Leahy Scale: Poor                              Cognition Arousal/Alertness: Awake/alert Behavior During Therapy: WFL for tasks assessed/performed Overall Cognitive Status: Within Functional Limits for tasks assessed                                          Exercises Total Joint Exercises Ankle Circles/Pumps: AROM, Both, 15 reps, Supine    General Comments        Pertinent Vitals/Pain Pain Assessment Pain Assessment: 0-10 Pain Score: 5  Pain Location: R knee Pain Descriptors / Indicators: Aching, Sore Pain Intervention(s): Limited activity within patient's tolerance, Monitored during session, Premedicated before session, Ice applied    Home Living Family/patient expects to be discharged to:: Private residence Living Arrangements: Alone Available Help at Discharge: Family;Available 24 hours/day Type of  Home: House Home Access: Stairs to enter   [employer] of Steps: 1   Home Layout: One level Home Equipment: Agricultural consultant (2 wheels);Cane - single point      Prior Function            PT Goals (current goals can now be found in the care plan section) Acute Rehab PT Goals Patient Stated Goal: Regain IND PT Goal Formulation: With patient Time For Goal Achievement: 12/14/22 Potential to Achieve Goals: Good Progress towards PT goals: Progressing toward goals    Frequency     7X/week      PT Plan Current plan remains appropriate    Co-evaluation              AM-PAC PT "6 Clicks" Mobility   Outcome Measure  Help needed turning from your back to your side while in a flat bed without using bedrails?: A Little Help needed moving from lying on your back to sitting on the side of a flat bed without using bedrails?: A Little Help needed moving to and from a bed to a chair (including a wheelchair)?: A Little Help needed standing up from a chair using your arms (e.g., wheelchair or bedside chair)?: A Lot Help needed to walk in hospital room?: Total Help needed climbing 3-5 steps with a railing? : Total 6 Click Score: 13    End of Session Equipment Utilized During Treatment: Gait belt Activity Tolerance: Patient limited by fatigue Patient left: in chair;with call bell/phone within reach Nurse Communication: Mobility status PT Visit Diagnosis: Difficulty in walking, not elsewhere classified (R26.2)     Time: 5284-1324 PT Time Calculation (min) (ACUTE ONLY): 19 min  Charges:  $Therapeutic Activity: 8-22 mins                     Mauro Kaufmann PT Acute Rehabilitation Services Pager [phone] Office [phone]    [facility] 12/07/2022, 4:37 PM

## 2022-12-07 NOTE — Evaluation (Signed)
Physical Therapy Evaluation Patient Details Name: [name] MRN: [id number] DOB: 18-May-1951 Today's Date: 12/07/2022  History of Present Illness  Pt s/p tibial revision of L TKR and with hx of DM, Vertigo, L TSR, bil TKR (multiple L TKR revisions) and R THR  Clinical Impression  Pt admitted as above and presenting with functional mobility limitations 2* decreased L LE strength/ROM, post op pain, and PWB on L LE.  Pt should progress to dc home with family assist and reports first OP PT scheduled for 12/13/22.      Recommendations for follow up therapy are one component of a multi-disciplinary discharge planning process, led by the attending physician.  Recommendations may be updated based on patient status, additional functional criteria and insurance authorization.  Follow Up Recommendations       Assistance Recommended at Discharge Set up Supervision/Assistance  Patient can return home with the following       Equipment Recommendations None recommended by PT  Recommendations for Other Services       Functional Status Assessment Patient has had a recent decline in their functional status and demonstrates the ability to make significant improvements in function in a reasonable and predictable amount of time.     Precautions / Restrictions Precautions Precautions: Knee;Fall Restrictions Weight Bearing Restrictions: Yes LLE Weight Bearing: Partial weight bearing LLE Partial Weight Bearing Percentage or Pounds: 50%      Mobility  Bed Mobility Overal bed mobility: Needs Assistance Bed Mobility: Supine to Sit     Supine to sit: Min assist     General bed mobility comments: cues for sequence and use of L LE to self assist    Transfers Overall transfer level: Needs assistance Equipment used: Rolling walker (2 wheels) Transfers: Sit to/from Stand, Bed to chair/wheelchair/BSC Sit to Stand: Min assist, Mod assist   Step pivot transfers: Min assist, Mod assist        General transfer comment: cues for LE management and use of UEs to self assist; step pvt bed to [facility]    Ambulation/Gait                  Stairs            Wheelchair Mobility    Modified Rankin (Stroke Patients Only)       Balance Overall balance assessment: Needs assistance Sitting-balance support: No upper extremity supported, Feet supported Sitting balance-Leahy Scale: Fair     Standing balance support: Bilateral upper extremity supported Standing balance-Leahy Scale: Poor                               Pertinent Vitals/Pain Pain Assessment Pain Assessment: 0-10 Pain Score: 5  Pain Location: R knee Pain Descriptors / Indicators: Aching, Sore Pain Intervention(s): Limited activity within patient's tolerance, Monitored during session, Premedicated before session, Ice applied    Home Living Family/patient expects to be discharged to:: Private residence Living Arrangements: Alone Available Help at Discharge: Family;Available 24 hours/day Type of Home: House Home Access: Stairs to enter   [employer] of Steps: 1   Home Layout: One level Home Equipment: Agricultural consultant (2 wheels);Cane - single point      Prior Function Prior Level of Function : Independent/Modified Independent                     Hand Dominance   Dominant Hand: Right    Extremity/Trunk Assessment   Upper  Extremity Assessment Upper Extremity Assessment: Overall WFL for tasks assessed    Lower Extremity Assessment Lower Extremity Assessment: RLE deficits/detail    Cervical / Trunk Assessment Cervical / Trunk Assessment: Normal  Communication   Communication: No difficulties  Cognition Arousal/Alertness: Awake/alert Behavior During Therapy: WFL for tasks assessed/performed Overall Cognitive Status: Within Functional Limits for tasks assessed                                          General Comments      Exercises      Assessment/Plan    PT Assessment Patient needs continued PT services  PT Problem List Decreased strength;Decreased range of motion;Decreased activity tolerance;Decreased balance;Decreased mobility;Decreased knowledge of use of DME;Pain       PT Treatment Interventions DME instruction;Gait training;Stair training;Functional mobility training;Therapeutic activities;Therapeutic exercise;Patient/family education    PT Goals (Current goals can be found in the Care Plan section)  Acute Rehab PT Goals Patient Stated Goal: Regain IND PT Goal Formulation: With patient Time For Goal Achievement: 12/14/22 Potential to Achieve Goals: Good    Frequency 7X/week     Co-evaluation               AM-PAC PT "6 Clicks" Mobility  Outcome Measure Help needed turning from your back to your side while in a flat bed without using bedrails?: A Little Help needed moving from lying on your back to sitting on the side of a flat bed without using bedrails?: A Little Help needed moving to and from a bed to a chair (including a wheelchair)?: A Little Help needed standing up from a chair using your arms (e.g., wheelchair or bedside chair)?: A Lot Help needed to walk in hospital room?: Total Help needed climbing 3-5 steps with a railing? : Total 6 Click Score: 13    End of Session Equipment Utilized During Treatment: Gait belt Activity Tolerance: Patient limited by fatigue Patient left: Other (comment) (BSC) Nurse Communication: Mobility status PT Visit Diagnosis: Difficulty in walking, not elsewhere classified (R26.2)    Time: 1441-1500 PT Time Calculation (min) (ACUTE ONLY): 19 min   Charges:   PT Evaluation $PT Eval Low Complexity: 1 Low          Mauro Kaufmann PT Acute Rehabilitation Services Pager [phone] Office [phone]   Marty Sadlowski 12/07/2022, 4:30 PM

## 2022-12-07 NOTE — Transfer of Care (Signed)
Immediate Anesthesia Transfer of Care Note  Patient: [name]  Procedure(s) Performed: TOTAL KNEE REVISION/TIBIAL COMPONENT (Left: Knee)  Patient Location: PACU  Anesthesia Type:GA combined with regional for post-op pain  Level of Consciousness: awake, alert , oriented, and patient cooperative  Airway & Oxygen Therapy: Patient Spontanous Breathing and Patient connected to face mask oxygen  Post-op Assessment: Report given to RN, Post -op Vital signs reviewed and stable, and Patient moving all extremities  Post vital signs: Reviewed and stable  Last Vitals:  Vitals Value Taken Time  BP 143/78 12/07/22 0945  Temp    Pulse 57 12/07/22 0948  Resp 13 12/07/22 0948  SpO2 100 % 12/07/22 0948  Vitals shown include unvalidated device data.  Last Pain:  Vitals:   12/07/22 0622  TempSrc: Oral  PainSc:          Complications: No notable events documented.

## 2022-12-07 NOTE — Interval H&P Note (Signed)
History and Physical Interval Note:  12/07/2022 7:06 AM  [name]  has presented today for surgery, with the diagnosis of Failure of left total knee arthroplasty revision.  The various methods of treatment have been discussed with the patient and family. After consideration of risks, benefits and other options for treatment, the patient has consented to  Procedure(s): TOTAL KNEE REVISION/TIBIAL COMPONENT (Left) as a surgical intervention.  The patient's history has been reviewed, patient examined, no change in status, stable for surgery.  I have reviewed the patient's chart and labs.  Questions were answered to the patient's satisfaction.     Shelda Pal

## 2022-12-07 NOTE — Discharge Instructions (Signed)

## 2022-12-07 NOTE — Anesthesia Procedure Notes (Signed)
Anesthesia Regional Block: Adductor canal block   Pre-Anesthetic Checklist: , timeout performed,  Correct Patient, Correct Site, Correct Laterality,  Correct Procedure, Correct Position, site marked,  Risks and benefits discussed,  Surgical consent,  Pre-op evaluation,  At surgeon's request and post-op pain management  Laterality: Left  Prep: chloraprep       Needles:  Injection technique: Single-shot  Needle Type: Stimulator Needle - 80     Needle Length: 10cm  Needle Gauge: 21     Additional Needles:   Narrative:  Start time: 12/07/2022 7:06 AM End time: 12/07/2022 7:16 AM Injection made incrementally with aspirations every 5 mL.  Performed by: Personally  Anesthesiologist: Heather Roberts, MD

## 2022-12-07 NOTE — Anesthesia Procedure Notes (Signed)
Procedure Name: Intubation Date/Time: 12/07/2022 7:36 AM  Performed by: Johnette Abraham, CRNAPre-anesthesia Checklist: Patient identified, Emergency Drugs available, Suction available and Patient being monitored Patient Re-evaluated:Patient Re-evaluated prior to induction Oxygen Delivery Method: Circle System Utilized Preoxygenation: Pre-oxygenation with 100% oxygen Induction Type: IV induction Ventilation: Mask ventilation without difficulty Laryngoscope Size: Glidescope and 3 Grade View: Grade III Tube type: Oral Tube size: 7.0 mm Number of attempts: 3 Airway Equipment and Method: Stylet and Oral airway Placement Confirmation: ETT inserted through vocal cords under direct vision, positive ETCO2 and breath sounds checked- equal and bilateral Secured at: 22 cm Tube secured with: Tape Dental Injury: Teeth and Oropharynx as per pre-operative assessment

## 2022-12-07 NOTE — Op Note (Signed)
NAMEAIRIAL, [name] MEDICAL RECORD NO: [id number] ACCOUNT NO: [id number] DATE OF BIRTH: October 03, 1950 FACILITY: [facility]ATION: [id number] PHYSICIAN: Madlyn Frankel. 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  PROCEDURE:  Revision of left total knee arthroplasty, left tibial component as well as a polyethylene insert.  COMPONENTS USED:  DePuy Attune revision knee system with a size 4 revision tibial tray with a 14 x 130 mm cemented sleeve and a 69 press-fit sleeve.  We used a 20 mm revision polyethylene insert to match the 4 femur.  SURGEON:  Madlyn Frankel. Charlann Boxer, MD  ASSISTANT:  Rosalene Billings, PA-C.  Note that [name] was present for the entirety of the case from preoperative positioning, perioperative management of the operative extremity, general facilitation of the case and primary wound closure.  ANESTHESIA:  Regional plus general after failed attempt at spinal block.  SPECIMENS: None.  COMPLICATIONS:  None.  DRAINS:  None.  TOURNIQUET:  Utilized at 52 minutes at 225 mmHg.  INDICATIONS FOR PROCEDURE:  The patient is a very pleasant 72 year old female with longstanding history of left knee surgeries.  She had had a revision left total knee arthroplasty with press-fit components, however, has developed pain over the years.   Serial radiographic evaluation raised concerns for aseptic loosening with migration of her stem into valgus.  This was confirmed radiographically, but also by bone scan.  Based on the persistence of her pain, we discussed proceeding with surgery.   Additionally, she has had some ongoing issues with a mechanical-type symptom of the knee that I have not yet diagnosed even after attempted  revision and evaluation of her patella with concerns of the patella subluxation, but not identifying this.   Consent was obtained for management of her left aseptic failed left knee.  Risks of infection, DVT, component failure, need for future surgeries were reviewed.  DESCRIPTION OF PROCEDURE:  The patient was brought to the operative theater.  Once adequate anesthesia, preoperative antibiotics, Ancef administered as well as tranexamic acid and Decadron, she was positioned supine with a left thigh tourniquet placed.   The left lower extremity was then prepped and draped in sterile fashion.  Timeout was performed identifying the patient, planned procedure, and extremity.  The left lower extremity was then exsanguinated and tourniquet elevated to 225 mmHg.  Her old  incision was excised.  Soft tissue planes created.  Median arthrotomy was then made, encountering clear synovial fluid, no signs of infection.  Following initial exposure and scar debridement throughout the knee medially, laterally, parapatellar as well  as suprapatellar region, the knee was subluxated anteriorly and the tibia subluxated and the polyethylene removed.  I then removed soft tissue and nonviable tissue around the proximal aspect of the tibia.  I then based on the radiographic appearance and  thoughts of this that the construct was loose began using an osteotome and was able to disimpact the tibial revised stem with the stem in place from the sleeve.  At this point, I was able to use osteotomes and worked around the sleeve in order to remove  any bony fixation that was present with visible evidence of the sleeve being loose.  With the tibial tray with its stem in place and inability for it  to pass through the sleeve without impaction, I was able to remove the construct with impaction.  We  used an osteotome.  At this point, we debrided the proximal tibia further for evaluation.  I then used small reamers and bypassed the valgus  position to tibial stem location to make certain we were isolated within the shaft of the tibia.  I then reamed  up to 16 mm to use a 14 mm cemented stem.  We then broached up from the 53 broach up to the 69 broach and did a trial reduction and found that the knee was stable on trial reduction with a 20 mm insert.  There are some concerns identified  intraoperatively and laxity related to the medial collateral ligament, however, with the knee in extension, there does appear to be an intact medial collateral construct.  Given all these findings, I elected to use the size 4 tibial tray with a 69  press-fit sleeve and a 14 x 130 mm cemented stem.  We placed a cement restrictor deep.  The tibia shaft and the knee was irrigated with normal saline solution.  The construct was then configured on the back table.  The cement restrictor was placed deep.   We then mixed 2 batches of cement using the cement gun and placed cement into the shaft of the tibia and then impacted the stem for good fixation proximally on the sleeve.  We then brought the knee to extension with a 20 mm insert and allowed the cement  to fully cure.  Once the cement fully cured, any cement that needed to be removed was removed.  We opened up the 20 mm revision tibial or polyethylene insert and placed into the tibia.  The knee was reduced.  The tourniquet had been let down after 52  minutes.  There was no significant hemostasis required other than some mild punctate bleeding from the synovectomy that was performed.  The knee was re-irrigated.  The extensor mechanism was then reapproximated using #1 Vicryl and #1 Stratafix suture.   The remainder of the wound was closed with 2-0 Vicryl and running Monocryl stitch.  The wound was cleaned, dried and dressed sterilely using surgical glue and Aquacel dressing.  The patient was brought to the recovery room in stable condition, tolerating  the procedure well.       PAA D: 12/07/2022 9:26:54 am T:  12/07/2022 11:24:00 am  JOB: [id number]/ [id number]

## 2022-12-07 NOTE — Brief Op Note (Signed)
12/07/2022  9:18 AM  PATIENT:  [name]  73 y.o. female  PRE-OPERATIVE DIAGNOSIS:  Failure of left total knee arthroplasty revision  POST-OPERATIVE DIAGNOSIS:  Failure of left total knee arthroplasty revision  PROCEDURE:  Procedure(s): TOTAL KNEE REVISION/TIBIAL COMPONENT (Left)  SURGEON:  Surgeon(s) and Role:    Durene Romans, MD - Primary  PHYSICIAN ASSISTANT: Rosalene Billings, PA-C  ANESTHESIA:   regional and general  EBL:  40 mL   BLOOD ADMINISTERED:none  DRAINS: none   LOCAL MEDICATIONS USED:  MARCAINE     SPECIMEN:  No Specimen  DISPOSITION OF SPECIMEN:  N/A  COUNTS:  YES  TOURNIQUET:   Total Tourniquet Time Documented: Thigh (Left) - 52 minutes Total: Thigh (Left) - 52 minutes   DICTATION: .Other Dictation: Dictation Number [id number]  PLAN OF CARE: Admit for overnight observation  PATIENT DISPOSITION:  PACU - hemodynamically stable.   Delay start of Pharmacological VTE agent (>24hrs) due to surgical blood loss or risk of bleeding: no

## 2022-12-07 NOTE — Anesthesia Postprocedure Evaluation (Signed)
Anesthesia Post Note  Patient: [name]  Procedure(s) Performed: TOTAL KNEE REVISION/TIBIAL COMPONENT (Left: Knee)     Patient location during evaluation: PACU Anesthesia Type: General Level of consciousness: sedated Pain management: pain level controlled Vital Signs Assessment: post-procedure vital signs reviewed and stable Respiratory status: spontaneous breathing and respiratory function stable Cardiovascular status: stable Postop Assessment: no apparent nausea or vomiting Anesthetic complications: no  No notable events documented.  Last Vitals:  Vitals:   12/07/22 1040 12/07/22 1056  BP: 130/74 134/73  Pulse: (!) 49 (!) 49  Resp: 12 16  Temp:  (!) 36.4 C  SpO2: 99% 99%    Last Pain:  Vitals:   12/07/22 1056  TempSrc: Oral  PainSc:                  Yousof Alderman DANIEL

## 2022-12-08 ENCOUNTER — Encounter (HOSPITAL_COMMUNITY): Payer: Self-pay | Admitting: Orthopedic Surgery

## 2022-12-08 ENCOUNTER — Other Ambulatory Visit (HOSPITAL_COMMUNITY): Payer: Self-pay

## 2022-12-08 LAB — BASIC METABOLIC PANEL
Anion gap: 7 (ref 5–15)
BUN: 18 mg/dL (ref 8–23)
CO2: 27 mmol/L (ref 22–32)
Calcium: 8.9 mg/dL (ref 8.9–10.3)
Chloride: 104 mmol/L (ref 98–111)
Creatinine, Ser: 0.76 mg/dL (ref 0.44–1.00)
GFR, Estimated: >60 mL/min (ref >60)
Glucose, Bld: 122 mg/dL — ABNORMAL HIGH (ref 70–99)
Potassium: 3.9 mmol/L (ref 3.5–5.1)
Sodium: 138 mmol/L (ref 135–145)

## 2022-12-08 LAB — CBC
HCT: 34.6 % — ABNORMAL LOW (ref 36.0–46.0)
Hemoglobin: 11.4 g/dL — ABNORMAL LOW (ref 12.0–15.0)
MCH: 27.6 pg (ref 26.0–34.0)
MCHC: 32.9 g/dL (ref 30.0–36.0)
MCV: 83.8 fL (ref 80.0–100.0)
Platelets: 233 10*3/uL (ref 150–400)
RBC: 4.13 MIL/uL (ref 3.87–5.11)
RDW: 16 % — ABNORMAL HIGH (ref 11.5–15.5)
WBC: 9 10*3/uL (ref 4.0–10.5)
nRBC: 0 % (ref 0.0–0.2)

## 2022-12-08 MED ORDER — CEFADROXIL 500 MG PO CAPS
500.0000 mg | ORAL_CAPSULE | Freq: Two times a day (BID) | ORAL | 0 refills | Status: AC
  Filled 2022-12-08: qty 14, 7d supply, fill #0

## 2022-12-08 MED ORDER — OXYCODONE HCL 5 MG PO TABS
5.0000 mg | ORAL_TABLET | ORAL | 0 refills | Status: DC | PRN
  Filled 2022-12-08: qty 42, 7d supply, fill #0

## 2022-12-08 MED ORDER — POLYETHYLENE GLYCOL 3350 17 G PO PACK
17.0000 g | PACK | Freq: Two times a day (BID) | ORAL | 0 refills | Status: AC
  Filled 2022-12-08: qty 14, 7d supply, fill #0

## 2022-12-08 MED ORDER — ASPIRIN 81 MG PO CHEW
81.0000 mg | CHEWABLE_TABLET | Freq: Two times a day (BID) | ORAL | 0 refills | Status: AC
  Filled 2022-12-08: qty 56, 28d supply, fill #0

## 2022-12-08 MED ORDER — METHOCARBAMOL 500 MG PO TABS
500.0000 mg | ORAL_TABLET | Freq: Four times a day (QID) | ORAL | 2 refills | Status: DC | PRN
  Filled 2022-12-08: qty 40, 10d supply, fill #0

## 2022-12-08 MED ORDER — SENNA 8.6 MG PO TABS
2.0000 | ORAL_TABLET | Freq: Every day | ORAL | 0 refills | Status: AC
  Filled 2022-12-08: qty 28, 14d supply, fill #0

## 2022-12-08 NOTE — TOC Transition Note (Signed)
Transition of Care [facility]) - CM/SW Discharge Note   Patient Details  Name: [name] MRN: [id number] Date of Birth: 07-05-1951  Transition of Care [facility]) CM/SW Contact:  Amada Jupiter, LCSW Phone Number: 12/08/2022, 10:51 AM   Clinical Narrative:     Met with pt who confirms she has needed DME in the home.  OPPT already set up with [facility] ([place]).  No TOC needs.  Final next level of care: OP Rehab Barriers to Discharge: No Barriers Identified   Patient Goals and CMS Choice      Discharge Placement                         Discharge Plan and Services Additional resources added to the After Visit Summary for                  DME Arranged: N/A DME Agency: NA                  Social Determinants of Health (SDOH) Interventions SDOH Screenings   Food Insecurity: No Food Insecurity (12/07/2022)  Housing: Low Risk  (12/07/2022)  Transportation Needs: No Transportation Needs (12/07/2022)  Utilities: Not At Risk (12/07/2022)  Alcohol Screen: Low Risk  (10/17/2022)  Depression (PHQ2-9): Low Risk  (10/17/2022)  Financial Resource Strain: Low Risk  (10/17/2022)  Physical Activity: Inactive (10/26/2022)  Social Connections: Moderately Isolated (10/17/2022)  Stress: No Stress Concern Present (10/17/2022)  Tobacco Use: Low Risk  (12/08/2022)     Readmission Risk Interventions    12/08/2022   10:50 AM  Readmission Risk Prevention Plan  Post Dischage Appt Complete  Medication Screening Complete  Transportation Screening Complete

## 2022-12-08 NOTE — Progress Notes (Signed)
   Subjective: 1 Day Post-Op Procedure(s) (LRB): TOTAL KNEE REVISION/TIBIAL COMPONENT (Left) Patient reports pain as mild.   Patient seen in rounds with Dr. Charlann Boxer. Patient is well, and has had no acute complaints or problems. No acute events overnight. Patient ambulated 6 feet with PT.  We will start therapy today.   Objective: Vital signs in last 24 hours: Temp:  [97.5 F (36.4 C)-98.4 F (36.9 C)] 97.9 F (36.6 C) (05/31 0613) Pulse Rate:  [49-63] 63 (05/31 0613) Resp:  [12-18] 17 (05/31 0613) BP: (101-143)/(59-78) 123/59 (05/31 0613) SpO2:  [95 %-100 %] 100 % (05/31 0613)  Intake/Output from previous day:  Intake/Output Summary (Last 24 hours) at 12/08/2022 0805 Last data filed at 12/08/2022 0600 Gross per 24 hour  Intake 2446.26 ml  Output 41 ml  Net 2405.26 ml     Intake/Output this shift: No intake/output data recorded.  Labs: Recent Labs    12/08/22 0343  HGB 11.4*   Recent Labs    12/08/22 0343  WBC 9.0  RBC 4.13  HCT 34.6*  PLT 233   Recent Labs    12/08/22 0343  NA 138  K 3.9  CL 104  CO2 27  BUN 18  CREATININE 0.76  GLUCOSE 122*  CALCIUM 8.9   No results for input(s): "LABPT", "INR" in the last 72 hours.  Exam: General - Patient is Alert and Oriented Extremity - Neurologically intact Sensation intact distally Intact pulses distally Dorsiflexion/Plantar flexion intact Dressing - dressing C/D/I Motor Function - intact, moving foot and toes well on exam.   Past Medical History:  Diagnosis Date   Anxiety    Complication of anesthesia    difficult to wake up after anesthesia, itching after spinal   Constipation    Essential hypertension    GERD (gastroesophageal reflux disease)    Heart murmur    Hyperlipidemia    Insomnia    Obesity    Osteoarthritis    Superficial fungus infection of skin 11/25/2015   Type 2 diabetes mellitus (HCC)    patient denies   Urge incontinence 11/04/2014   Vertigo    Vitamin D deficiency      Assessment/Plan: 1 Day Post-Op Procedure(s) (LRB): TOTAL KNEE REVISION/TIBIAL COMPONENT (Left) Principal Problem:   S/P revision of total knee, left  Estimated body mass index is 38.77 kg/m as calculated from the following:   Height as of this encounter: 5\' 1"  (1.549 m).   Weight as of this encounter: 93.1 kg. Advance diet Up with therapy D/C IV fluids   Patient's anticipated LOS is less than 2 midnights, meeting these requirements: - Younger than 50 - Lives within 1 hour of care - Has a competent adult at home to recover with post-op recover - NO history of  - Chronic pain requiring opiods  - Diabetes  - Coronary Artery Disease  - Heart failure  - Heart attack  - Stroke  - DVT/VTE  - Cardiac arrhythmia  - Respiratory Failure/COPD  - Renal failure  - Anemia  - Advanced Liver disease     DVT Prophylaxis - Aspirin PWB 50% LLE  Hgb stable at 11.4 this AM.  Up with PT today.  Plan is to go Home after hospital stay. Plan for discharge today following 1-2 sessions of PT as long as they are meeting their goals. Patient is scheduled for OPPT. Follow up in the office in 2 weeks.   Dennie Bible, PA-C Orthopedic Surgery [phone] 12/08/2022, 8:05 AM

## 2022-12-08 NOTE — Plan of Care (Signed)
  Problem: Education: Goal: Knowledge of the prescribed therapeutic regimen will improve Outcome: Progressing   Problem: Pain Management: Goal: Pain level will decrease with appropriate interventions Outcome: Progressing   Problem: Activity: Goal: Risk for activity intolerance will decrease Outcome: Progressing   

## 2022-12-08 NOTE — Progress Notes (Signed)
Physical Therapy Treatment Patient Details Name: [name] MRN: [id number] DOB: [birth year] Today's Date: 12/08/2022   History of Present Illness Pt s/p tibial revision of L TKR and with hx of DM, Vertigo, L TSR, bil TKR (multiple L TKR revisions) and R THR    PT Comments    Pt continues very motivated and progressing steadily with mobility.  Pt hopeful for dc home tomorrow.  Recommendations for follow up therapy are one component of a multi-disciplinary discharge planning process, led by the attending physician.  Recommendations may be updated based on patient status, additional functional criteria and insurance authorization.  Follow Up Recommendations       Assistance Recommended at Discharge Set up Supervision/Assistance  Patient can return home with the following A little help with walking and/or transfers;A little help with bathing/dressing/bathroom;Assistance with cooking/housework;Assist for transportation;Help with stairs or ramp for entrance   Equipment Recommendations  None recommended by PT    Recommendations for Other Services       Precautions / Restrictions Precautions Precautions: Knee;Fall Restrictions Weight Bearing Restrictions: Yes LLE Weight Bearing: Partial weight bearing LLE Partial Weight Bearing Percentage or Pounds: 50     Mobility  Bed Mobility Overal bed mobility: Needs Assistance Bed Mobility: Supine to Sit     Supine to sit: Min assist     General bed mobility comments: up in chair and requests back to same    Transfers Overall transfer level: Needs assistance Equipment used: Rolling walker (2 wheels) Transfers: Sit to/from Stand Sit to Stand: Min assist           General transfer comment: cues for LE management and use of UEs to self assist;    Ambulation/Gait Ambulation/Gait assistance: Min assist, Min guard Gait Distance (Feet): 36 Feet (twice) Assistive device: Rolling walker (2 wheels) Gait Pattern/deviations:  Step-to pattern, Decreased step length - right, Decreased step length - left, Shuffle, Trunk flexed, Antalgic Gait velocity: decr     General Gait Details: cues for sequence, posture, position from RW and PWB   Stairs             Wheelchair Mobility    Modified Rankin (Stroke Patients Only)       Balance Overall balance assessment: Needs assistance Sitting-balance support: No upper extremity supported, Feet supported Sitting balance-Leahy Scale: Fair     Standing balance support: Single extremity supported Standing balance-Leahy Scale: Poor                              Cognition Arousal/Alertness: Awake/alert Behavior During Therapy: WFL for tasks assessed/performed Overall Cognitive Status: Within Functional Limits for tasks assessed                                          Exercises Total Joint Exercises Ankle Circles/Pumps: AROM, Both, 15 reps, Supine Quad Sets: AROM, Both, 10 reps, Supine Heel Slides: AAROM, Left, 15 reps, Supine Hip ABduction/ADduction: Left, AAROM, AROM, 10 reps, Supine Straight Leg Raises: AAROM, Left, 15 reps, Supine    General Comments        Pertinent Vitals/Pain Pain Assessment Pain Assessment: 0-10 Pain Score: 4  Pain Location: R knee Pain Descriptors / Indicators: Aching, Sore Pain Intervention(s): Limited activity within patient's tolerance, Monitored during session, Premedicated before session, Ice applied    Home Living  Prior Function            PT Goals (current goals can now be found in the care plan section) Acute Rehab PT Goals Patient Stated Goal: Regain IND PT Goal Formulation: With patient Time For Goal Achievement: 12/14/22 Potential to Achieve Goals: Good Progress towards PT goals: Progressing toward goals    Frequency    7X/week      PT Plan Current plan remains appropriate    Co-evaluation              AM-PAC PT "6  Clicks" Mobility   Outcome Measure  Help needed turning from your back to your side while in a flat bed without using bedrails?: A Little Help needed moving from lying on your back to sitting on the side of a flat bed without using bedrails?: A Little Help needed moving to and from a bed to a chair (including a wheelchair)?: A Little Help needed standing up from a chair using your arms (e.g., wheelchair or bedside chair)?: A Little Help needed to walk in hospital room?: A Little Help needed climbing 3-5 steps with a railing? : A Lot 6 Click Score: 17    End of Session Equipment Utilized During Treatment: Gait belt Activity Tolerance: Patient tolerated treatment well Patient left: in chair;with call bell/phone within reach Nurse Communication: Mobility status PT Visit Diagnosis: Difficulty in walking, not elsewhere classified (R26.2)     Time: 1610-9604 PT Time Calculation (min) (ACUTE ONLY): 30 min  Charges:  $Gait Training: 8-22 mins $Therapeutic Exercise: 8-22 mins $Therapeutic Activity: 8-22 mins                     [name] PT Acute Rehabilitation Services Pager [phone] Office [phone]    [name] 12/08/2022, 2:06 PM

## 2022-12-08 NOTE — Progress Notes (Signed)
Physical Therapy Treatment Patient Details Name: [name] MRN: [id number] DOB: 05/06/1951 Today's Date: 12/08/2022   History of Present Illness Pt s/p tibial revision of L TKR and with hx of DM, Vertigo, L TSR, bil TKR (multiple L TKR revisions) and R THR    PT Comments    Pt in good spirits but expresses concern regarding continued "clunking" in knee with flex/ext.  Pt very cooperative and progressing with mobility with noted increased distance ambulated and decreasing level of assist with most tasks.   Recommendations for follow up therapy are one component of a multi-disciplinary discharge planning process, led by the attending physician.  Recommendations may be updated based on patient status, additional functional criteria and insurance authorization.  Follow Up Recommendations       Assistance Recommended at Discharge Set up Supervision/Assistance  Patient can return home with the following A little help with walking and/or transfers;A little help with bathing/dressing/bathroom;Assistance with cooking/housework;Assist for transportation;Help with stairs or ramp for entrance   Equipment Recommendations  None recommended by PT    Recommendations for Other Services       Precautions / Restrictions Precautions Precautions: Knee;Fall Restrictions Weight Bearing Restrictions: Yes LLE Weight Bearing: Partial weight bearing LLE Partial Weight Bearing Percentage or Pounds: 50     Mobility  Bed Mobility Overal bed mobility: Needs Assistance Bed Mobility: Supine to Sit     Supine to sit: Min assist     General bed mobility comments: cues for sequence and use of L LE to self assist    Transfers Overall transfer level: Needs assistance Equipment used: Rolling walker (2 wheels) Transfers: Sit to/from Stand Sit to Stand: Min assist           General transfer comment: cues for LE management and use of UEs to self assist;    Ambulation/Gait Ambulation/Gait  assistance: Min assist Gait Distance (Feet): 25 Feet (twice) Assistive device: Rolling walker (2 wheels) Gait Pattern/deviations: Step-to pattern, Decreased step length - right, Decreased step length - left, Shuffle, Trunk flexed, Antalgic Gait velocity: decr     General Gait Details: cues for sequence, posture, position from RW and PWB   Stairs             Wheelchair Mobility    Modified Rankin (Stroke Patients Only)       Balance Overall balance assessment: Needs assistance Sitting-balance support: No upper extremity supported, Feet supported Sitting balance-Leahy Scale: Fair     Standing balance support: Single extremity supported Standing balance-Leahy Scale: Poor                              Cognition Arousal/Alertness: Awake/alert Behavior During Therapy: WFL for tasks assessed/performed Overall Cognitive Status: Within Functional Limits for tasks assessed                                          Exercises Total Joint Exercises Ankle Circles/Pumps: AROM, Both, 15 reps, Supine Quad Sets: AROM, Both, 10 reps, Supine Heel Slides: AAROM, Left, 15 reps, Supine Hip ABduction/ADduction: Left, AAROM, AROM, 10 reps, Supine Straight Leg Raises: AAROM, Left, 15 reps, Supine    General Comments        Pertinent Vitals/Pain Pain Assessment Pain Assessment: 0-10 Pain Score: 5  Pain Location: R knee Pain Descriptors / Indicators: Aching, Sore Pain Intervention(s): Limited activity  within patient's tolerance    Home Living                          Prior Function            PT Goals (current goals can now be found in the care plan section) Acute Rehab PT Goals Patient Stated Goal: Regain IND PT Goal Formulation: With patient Time For Goal Achievement: 12/14/22 Potential to Achieve Goals: Good Progress towards PT goals: Progressing toward goals    Frequency    7X/week      PT Plan Current plan remains  appropriate    Co-evaluation              AM-PAC PT "6 Clicks" Mobility   Outcome Measure  Help needed turning from your back to your side while in a flat bed without using bedrails?: A Little Help needed moving from lying on your back to sitting on the side of a flat bed without using bedrails?: A Little Help needed moving to and from a bed to a chair (including a wheelchair)?: A Little Help needed standing up from a chair using your arms (e.g., wheelchair or bedside chair)?: A Little Help needed to walk in hospital room?: A Little Help needed climbing 3-5 steps with a railing? : A Lot 6 Click Score: 17    End of Session Equipment Utilized During Treatment: Gait belt Activity Tolerance: Patient tolerated treatment well Patient left: in chair;with call bell/phone within reach Nurse Communication: Mobility status PT Visit Diagnosis: Difficulty in walking, not elsewhere classified (R26.2)     Time: 1610-9604 PT Time Calculation (min) (ACUTE ONLY): 40 min  Charges:  $Gait Training: 8-22 mins $Therapeutic Exercise: 8-22 mins $Therapeutic Activity: 8-22 mins                     Mauro Kaufmann PT Acute Rehabilitation Services Pager [phone] Office [phone]    Warnell Rasnic 12/08/2022, 12:59 PM

## 2022-12-09 ENCOUNTER — Other Ambulatory Visit (HOSPITAL_COMMUNITY): Payer: Self-pay

## 2022-12-09 LAB — CBC
HCT: 31.6 % — ABNORMAL LOW (ref 36.0–46.0)
Hemoglobin: 10.4 g/dL — ABNORMAL LOW (ref 12.0–15.0)
MCH: 27.5 pg (ref 26.0–34.0)
MCHC: 32.9 g/dL (ref 30.0–36.0)
MCV: 83.6 fL (ref 80.0–100.0)
Platelets: 232 10*3/uL (ref 150–400)
RBC: 3.78 MIL/uL — ABNORMAL LOW (ref 3.87–5.11)
RDW: 16.2 % — ABNORMAL HIGH (ref 11.5–15.5)
WBC: 7.4 10*3/uL (ref 4.0–10.5)
nRBC: 0 % (ref 0.0–0.2)

## 2022-12-09 NOTE — Progress Notes (Signed)
Physical Therapy Treatment Patient Details Name: [name] MRN: [id number] DOB: 02-Dec-1950 Today's Date: 12/09/2022   History of Present Illness Pt s/p tibial revision of L TKR and with hx of DM, Vertigo, L TSR, bil TKR (multiple L TKR revisions) and R THR    PT Comments    Pt continues motivated and progressing well with mobility. Pt up to ambulate increased distance in hall, negotiated stairs and performed HEP with written instruction provided and reviewed.  Pt eager for dc home this date, "I feel ready!"   Recommendations for follow up therapy are one component of a multi-disciplinary discharge planning process, led by the attending physician.  Recommendations may be updated based on patient status, additional functional criteria and insurance authorization.  Follow Up Recommendations       Assistance Recommended at Discharge Set up Supervision/Assistance  Patient can return home with the following A little help with walking and/or transfers;A little help with bathing/dressing/bathroom;Assistance with cooking/housework;Assist for transportation;Help with stairs or ramp for entrance   Equipment Recommendations  None recommended by PT    Recommendations for Other Services       Precautions / Restrictions Precautions Precautions: Knee;Fall Restrictions Weight Bearing Restrictions: No LLE Weight Bearing: Weight bearing as tolerated LLE Partial Weight Bearing Percentage or Pounds: 50     Mobility  Bed Mobility Overal bed mobility: Needs Assistance Bed Mobility: Supine to Sit     Supine to sit: Supervision     General bed mobility comments: Increased time but no physical assist    Transfers Overall transfer level: Needs assistance Equipment used: Rolling walker (2 wheels) Transfers: Sit to/from Stand Sit to Stand: Min guard, Supervision           General transfer comment: cues for LE management and use of UEs to self assist;     Ambulation/Gait Ambulation/Gait assistance: Min guard, Supervision Gait Distance (Feet): 100 Feet Assistive device: Rolling walker (2 wheels) Gait Pattern/deviations: Step-to pattern, Decreased step length - right, Decreased step length - left, Shuffle, Trunk flexed, Antalgic Gait velocity: decr     General Gait Details: min cues for sequence, posture, position from RW and PWB   Stairs Stairs: Yes Stairs assistance: Min assist Stair Management: No rails, One rail Right, Step to pattern, Forwards, With walker, With crutches Number of Stairs: 8 General stair comments: 3 single steps bkwd to access house; 5 stairs with crutch and rail; cues for sequence and foot/crutch placement   Wheelchair Mobility    Modified Rankin (Stroke Patients Only)       Balance Overall balance assessment: Needs assistance Sitting-balance support: No upper extremity supported, Feet supported Sitting balance-Leahy Scale: Good     Standing balance support: No upper extremity supported Standing balance-Leahy Scale: Fair                              Cognition Arousal/Alertness: Awake/alert Behavior During Therapy: WFL for tasks assessed/performed Overall Cognitive Status: Within Functional Limits for tasks assessed                                          Exercises Total Joint Exercises Ankle Circles/Pumps: AROM, Both, 15 reps, Supine Quad Sets: AROM, Both, 10 reps, Supine Heel Slides: AAROM, Left, 15 reps, Supine Hip ABduction/ADduction: Left, AAROM, AROM, 10 reps, Supine Straight Leg Raises: AAROM, Left, 15 reps,  Supine Long Arc Quad: AAROM, AROM, Left, 15 reps, Seated    General Comments        Pertinent Vitals/Pain Pain Assessment Pain Assessment: 0-10 Pain Score: 4  Pain Location: R knee Pain Descriptors / Indicators: Aching, Sore Pain Intervention(s): Limited activity within patient's tolerance, Monitored during session, Premedicated before  session, Ice applied    Home Living                          Prior Function            PT Goals (current goals can now be found in the care plan section) Acute Rehab PT Goals Patient Stated Goal: Regain IND PT Goal Formulation: With patient Time For Goal Achievement: 12/14/22 Potential to Achieve Goals: Good Progress towards PT goals: Progressing toward goals    Frequency    7X/week      PT Plan Current plan remains appropriate    Co-evaluation              AM-PAC PT "6 Clicks" Mobility   Outcome Measure  Help needed turning from your back to your side while in a flat bed without using bedrails?: A Little Help needed moving from lying on your back to sitting on the side of a flat bed without using bedrails?: A Little Help needed moving to and from a bed to a chair (including a wheelchair)?: A Little Help needed standing up from a chair using your arms (e.g., wheelchair or bedside chair)?: A Little Help needed to walk in hospital room?: A Little Help needed climbing 3-5 steps with a railing? : A Little 6 Click Score: 18    End of Session Equipment Utilized During Treatment: Gait belt Activity Tolerance: Patient tolerated treatment well Patient left: in chair;with call bell/phone within reach Nurse Communication: Mobility status PT Visit Diagnosis: Difficulty in walking, not elsewhere classified (R26.2)     Time: 1610-9604 PT Time Calculation (min) (ACUTE ONLY): 48 min  Charges:  $Gait Training: 8-22 mins $Therapeutic Exercise: 8-22 mins $Therapeutic Activity: 8-22 mins                     Mauro Kaufmann PT Acute Rehabilitation Services Pager [phone] Office [phone]    [name] 12/09/2022, 11:46 AM

## 2022-12-09 NOTE — Discharge Summary (Signed)
Physician Discharge Summary   Patient ID: [name] MRN: [id number] DOB/AGE: 1951-03-09 72 y.o.  Admit date: 12/07/2022 Discharge date: 12/09/22  Primary Diagnosis: failed left total knee replacement  Admission Diagnoses:  Past Medical History:  Diagnosis Date   Anxiety    Complication of anesthesia    difficult to wake up after anesthesia, itching after spinal   Constipation    Essential hypertension    GERD (gastroesophageal reflux disease)    Heart murmur    Hyperlipidemia    Insomnia    Obesity    Osteoarthritis    Superficial fungus infection of skin 11/25/2015   Type 2 diabetes mellitus (HCC)    patient denies   Urge incontinence 11/04/2014   Vertigo    Vitamin D deficiency    Discharge Diagnoses:   Principal Problem:   S/P revision of total knee, left  Estimated body mass index is 38.77 kg/m as calculated from the following:   Height as of this encounter: 5\' 1"  (1.549 m).   Weight as of this encounter: 93.1 kg.  Procedure:  Procedure(s) (LRB): TOTAL KNEE REVISION/TIBIAL COMPONENT (Left)   Consults: None  HPI: see H&P Laboratory Data: Admission on 12/07/2022  Component Date Value Ref Range Status   Glucose-Capillary 12/07/2022 124 (H)  70 - 99 mg/dL Final   Glucose reference range applies only to samples taken after fasting for at least 8 hours.   WBC 12/08/2022 9.0  4.0 - 10.5 K/uL Final   RBC 12/08/2022 4.13  3.87 - 5.11 MIL/uL Final   Hemoglobin 12/08/2022 11.4 (L)  12.0 - 15.0 g/dL Final   HCT 40/98/1191 34.6 (L)  36.0 - 46.0 % Final   MCV 12/08/2022 83.8  80.0 - 100.0 fL Final   MCH 12/08/2022 27.6  26.0 - 34.0 pg Final   MCHC 12/08/2022 32.9  30.0 - 36.0 g/dL Final   RDW 47/82/9562 16.0 (H)  11.5 - 15.5 % Final   Platelets 12/08/2022 233  150 - 400 K/uL Final   nRBC 12/08/2022 0.0  0.0 - 0.2 % Final   Performed at [facility], [address] [place], [place] [address]   Sodium 12/08/2022 138  135 - 145 mmol/L Final    Potassium 12/08/2022 3.9  3.5 - 5.1 mmol/L Final   Chloride 12/08/2022 104  98 - 111 mmol/L Final   CO2 12/08/2022 27  22 - 32 mmol/L Final   Glucose, Bld 12/08/2022 122 (H)  70 - 99 mg/dL Final   Glucose reference range applies only to samples taken after fasting for at least 8 hours.   BUN 12/08/2022 18  8 - 23 mg/dL Final   Creatinine, Ser 12/08/2022 0.76  0.44 - 1.00 mg/dL Final   Calcium 57/84/6962 8.9  8.9 - 10.3 mg/dL Final   GFR, Estimated 12/08/2022 >60  >60 mL/min Final   Comment: (NOTE) Calculated using the CKD-EPI Creatinine Equation (2021)    Anion gap 12/08/2022 7  5 - 15 Final   Performed at [facility], [address] [place], [place] [address]   WBC 12/09/2022 7.4  4.0 - 10.5 K/uL Final   RBC 12/09/2022 3.78 (L)  3.87 - 5.11 MIL/uL Final   Hemoglobin 12/09/2022 10.4 (L)  12.0 - 15.0 g/dL Final   HCT 13/24/4010 31.6 (L)  36.0 - 46.0 % Final   MCV 12/09/2022 83.6  80.0 - 100.0 fL Final   MCH 12/09/2022 27.5  26.0 - 34.0 pg Final   MCHC 12/09/2022  32.9  30.0 - 36.0 g/dL Final   RDW 16/04/9603 16.2 (H)  11.5 - 15.5 % Final   Platelets 12/09/2022 232  150 - 400 K/uL Final   nRBC 12/09/2022 0.0  0.0 - 0.2 % Final   Performed at [facility], [address] [place], [place] [address]  Hospital Outpatient Visit on 11/24/2022  Component Date Value Ref Range Status   MRSA, PCR 11/24/2022 NEGATIVE  NEGATIVE Final   Staphylococcus aureus 11/24/2022 NEGATIVE  NEGATIVE Final   Comment: (NOTE) The Xpert SA Assay (FDA approved for NASAL specimens in patients 39 years of age and older), is one component of a comprehensive surveillance program. It is not intended to diagnose infection nor to guide or monitor treatment. Performed at [facility], [address], [place], [place] [address]    Sodium 11/24/2022 139  135 - 145 mmol/L Final   Potassium 11/24/2022 3.7  3.5 - 5.1 mmol/L Final   Chloride 11/24/2022 103  98 -  111 mmol/L Final   CO2 11/24/2022 26  22 - 32 mmol/L Final   Glucose, Bld 11/24/2022 94  70 - 99 mg/dL Final   Glucose reference range applies only to samples taken after fasting for at least 8 hours.   BUN 11/24/2022 13  8 - 23 mg/dL Final   Creatinine, Ser 11/24/2022 0.81  0.44 - 1.00 mg/dL Final   Calcium 78/29/5621 10.0  8.9 - 10.3 mg/dL Final   GFR, Estimated 11/24/2022 >60  >60 mL/min Final   Comment: (NOTE) Calculated using the CKD-EPI Creatinine Equation (2021)    Anion gap 11/24/2022 10  5 - 15 Final   Performed at [facility], [address], [place], [place] [address]   WBC 11/24/2022 5.7  4.0 - 10.5 K/uL Final   RBC 11/24/2022 5.30 (H)  3.87 - 5.11 MIL/uL Final   Hemoglobin 11/24/2022 14.3  12.0 - 15.0 g/dL Final   HCT 78/46/9629 44.3  36.0 - 46.0 % Final   MCV 11/24/2022 83.6  80.0 - 100.0 fL Final   MCH 11/24/2022 27.0  26.0 - 34.0 pg Final   MCHC 11/24/2022 32.3  30.0 - 36.0 g/dL Final   RDW 52/84/1324 16.4 (H)  11.5 - 15.5 % Final   Platelets 11/24/2022 302  150 - 400 K/uL Final   nRBC 11/24/2022 0.0  0.0 - 0.2 % Final   Performed at [facility], [address], [place], [place] [address]   ABO/RH(D) 11/24/2022 O NEG   Final   Antibody Screen 11/24/2022 NEG   Final   Sample Expiration 11/24/2022 12/08/2022,2359   Final   Extend sample reason 11/24/2022    Final                   Value:NO TRANSFUSIONS OR PREGNANCY IN THE PAST 3 MONTHS Performed at [facility], [address], [place], [place] [address]    Glucose-Capillary 11/24/2022 122 (H)  70 - 99 mg/dL Final   Glucose reference range applies only to samples taken after fasting for at least 8 hours.  Office Visit on 10/17/2022  Component Date Value Ref Range Status   Fecal Occult Blood, POC 10/17/2022 Negative  Negative Final     X-Rays:NM Bone Scan 3 Phase Lower Extremity  Result Date: 12/05/2022 CLINICAL DATA:  LEFT knee pain. LEFT knee revision  2022. Arthroplasty 2005 EXAM: NUCLEAR MEDICINE 3-PHASE BONE SCAN TECHNIQUE: Radionuclide angiographic images, immediate static blood pool images, and  3-hour delayed static images were obtained of the knee after intravenous injection of radiopharmaceutical. RADIOPHARMACEUTICALS:  20.1 mCi Tc-87m MDP IV COMPARISON:  None Available. FINDINGS: Vascular phase: Increased blood flow to the LEFT knee compared to the RIGHT on dynamic imaging. Blood pool phase: Mild asymmetric radiotracer uptake in the LEFT knee compared to the RIGHT on blood pool phase. Delayed phase: Increased delayed radiotracer activity localizing to the femoral condyles and tibial plateau of the LEFT knee prosthetic as well as extending into the proximal tibia along the shaft of the tibial prosthetic. Mild uptake in the RIGHT knee. IMPRESSION: Increased radiotracer activity within the LEFT knee on all 3 phases of the exam is concerning for loosening or infection of the LEFT knee prosthetic. Electronically Signed   By: Genevive Bi M.D.   On: 12/05/2022 13:33    EKG: Orders placed or performed during the hospital encounter of 11/24/22   EKG 12-Lead   EKG 12-Lead     Hospital Course: [name] is a 72 y.o. who was admitted to [facility]. They were brought to the operating room on 12/07/2022 and underwent Procedure(s): TOTAL KNEE REVISION/TIBIAL COMPONENT.  Patient tolerated the procedure well and was later transferred to the recovery room and then to the orthopaedic floor for postoperative care.  They were given PO and IV analgesics for pain control following their surgery.  They were given 24 hours of postoperative antibiotics of  Anti-infectives (From admission, onward)    Start     Dose/Rate Route Frequency Ordered Stop   12/08/22 0000  cefadroxil (DURICEF) 500 MG capsule        500 mg Oral 2 times daily 12/08/22 0809 12/15/22 2359   12/07/22 1330  ceFAZolin (ANCEF) IVPB 2g/100 mL premix        2 g 200 mL/hr over 30  Minutes Intravenous Every 6 hours 12/07/22 1051 12/07/22 2109   12/07/22 0600  ceFAZolin (ANCEF) IVPB 2g/100 mL premix        2 g 200 mL/hr over 30 Minutes Intravenous On call to O.R. 12/07/22 1610 12/07/22 0741      and started on DVT prophylaxis in the form of Aspirin.   PT and OT were ordered for total joint protocol.  Discharge planning consulted to help with postop disposition and equipment needs.  Patient had a fair night on the evening of surgery.  They started to get up OOB with therapy on day one.   Continued to work with therapy into day two.  By day two, the patient had progressed with therapy and meeting their goals.  Incision was healing well.  Patient was seen in rounds and was ready to go home.   Diet: Regular diet Activity:WBAT Follow-up:in 10-14 days Disposition - Home Discharged Condition: good   Discharge Instructions     Call MD / Call 911   Complete by: As directed    If you experience chest pain or shortness of breath, CALL 911 and be transported to the hospital emergency room.  If you develope a fever above 101 F, pus (white drainage) or increased drainage or redness at the wound, or calf pain, call your surgeon's office.   Call MD / Call 911   Complete by: As directed    If you experience chest pain or shortness of breath, CALL 911 and be transported to the hospital emergency room.  If you develope a fever above 101 F, pus (white drainage) or increased drainage or redness at the wound, or  calf pain, call your surgeon's office.   Change dressing   Complete by: As directed    Maintain surgical dressing until follow up in the clinic. If the edges start to pull up, may reinforce with tape. If the dressing is no longer working, may remove and cover with gauze and tape, but must keep the area dry and clean.  Call with any questions or concerns.   Constipation Prevention   Complete by: As directed    Drink plenty of fluids.  Prune juice may be helpful.  You may use a  stool softener, such as Colace (over the counter) 100 mg twice a day.  Use MiraLax (over the counter) for constipation as needed.   Constipation Prevention   Complete by: As directed    Drink plenty of fluids.  Prune juice may be helpful.  You may use a stool softener, such as Colace (over the counter) 100 mg twice a day.  Use MiraLax (over the counter) for constipation as needed.   Diet - low sodium heart healthy   Complete by: As directed    Increase activity slowly as tolerated   Complete by: As directed    Partial weight bearing with assist device as directed.   Increase activity slowly as tolerated   Complete by: As directed    Post-operative opioid taper instructions:   Complete by: As directed    POST-OPERATIVE OPIOID TAPER INSTRUCTIONS: It is important to wean off of your opioid medication as soon as possible. If you do not need pain medication after your surgery it is ok to stop day one. Opioids include: Codeine, Hydrocodone(Norco, Vicodin), Oxycodone(Percocet, oxycontin) and hydromorphone amongst others.  Long term and even short term use of opiods can cause: Increased pain response Dependence Constipation Depression Respiratory depression And more.  Withdrawal symptoms can include Flu like symptoms Nausea, vomiting And more Techniques to manage these symptoms Hydrate well Eat regular healthy meals Stay active Use relaxation techniques(deep breathing, meditating, yoga) Do Not substitute Alcohol to help with tapering If you have been on opioids for less than two weeks and do not have pain than it is ok to stop all together.  Plan to wean off of opioids This plan should start within one week post op of your joint replacement. Maintain the same interval or time between taking each dose and first decrease the dose.  Cut the total daily intake of opioids by one tablet each day Next start to increase the time between doses. The last dose that should be eliminated is the  evening dose.      Post-operative opioid taper instructions:   Complete by: As directed    POST-OPERATIVE OPIOID TAPER INSTRUCTIONS: It is important to wean off of your opioid medication as soon as possible. If you do not need pain medication after your surgery it is ok to stop day one. Opioids include: Codeine, Hydrocodone(Norco, Vicodin), Oxycodone(Percocet, oxycontin) and hydromorphone amongst others.  Long term and even short term use of opiods can cause: Increased pain response Dependence Constipation Depression Respiratory depression And more.  Withdrawal symptoms can include Flu like symptoms Nausea, vomiting And more Techniques to manage these symptoms Hydrate well Eat regular healthy meals Stay active Use relaxation techniques(deep breathing, meditating, yoga) Do Not substitute Alcohol to help with tapering If you have been on opioids for less than two weeks and do not have pain than it is ok to stop all together.  Plan to wean off of opioids This plan should start within  one week post op of your joint replacement. Maintain the same interval or time between taking each dose and first decrease the dose.  Cut the total daily intake of opioids by one tablet each day Next start to increase the time between doses. The last dose that should be eliminated is the evening dose.      TED hose   Complete by: As directed    Use stockings (TED hose) for 2 weeks on both leg(s).  You may remove them at night for sleeping.      Allergies as of 12/09/2022   No Known Allergies      Medication List     STOP taking these medications    aspirin EC 81 MG tablet Replaced by: aspirin 81 MG chewable tablet       TAKE these medications    acetaminophen 325 MG tablet Commonly known as: TYLENOL Take 650 mg by mouth every 6 (six) hours as needed for moderate pain.   aspirin 81 MG chewable tablet Chew 1 tablet (81 mg total) by mouth 2 (two) times daily for 28 days. Replaces:  aspirin EC 81 MG tablet   calcium citrate 950 (200 Ca) MG tablet Commonly known as: CALCITRATE - dosed in mg elemental calcium Take 200 mg of elemental calcium by mouth daily.   cefadroxil 500 MG capsule Commonly known as: DURICEF Take 1 capsule (500 mg total) by mouth 2 (two) times daily for 7 days.   docusate sodium 100 MG capsule Commonly known as: COLACE Take 1 capsule (100 mg total) by mouth 2 (two) times daily. What changed: when to take this   Fish Oil 1000 MG Caps Take 1,000 mg by mouth daily.   GARLIQUE PO Take 1 tablet by mouth daily.   linaclotide 145 MCG Caps capsule Commonly known as: Linzess Take 1 capsule (145 mcg total) by mouth daily before breakfast.   lisinopril-hydrochlorothiazide 20-12.5 MG tablet Commonly known as: ZESTORETIC Take 1 tablet by mouth daily before breakfast.   methocarbamol 500 MG tablet Commonly known as: ROBAXIN Take 1 tablet (500 mg total) by mouth every 6 (six) hours as needed for muscle spasms.   multivitamin with minerals Tabs tablet Take 1 tablet by mouth daily.   oxyCODONE 5 MG immediate release tablet Commonly known as: Oxy IR/ROXICODONE Take 1 tablet (5 mg total) by mouth every 4 (four) hours as needed for severe pain.   phentermine 37.5 MG tablet Commonly known as: ADIPEX-P Take 37.5 mg by mouth daily before breakfast.   polyethylene glycol 17 g packet Commonly known as: MIRALAX / GLYCOLAX Take 17 g by mouth 2 (two) times daily. What changed: when to take this   senna 8.6 MG Tabs tablet Commonly known as: SENOKOT Take 2 tablets (17.2 mg total) by mouth at bedtime for 14 days.   zolpidem 10 MG tablet Commonly known as: AMBIEN Take 10 mg by mouth at bedtime.               Discharge Care Instructions  (From admission, onward)           Start     Ordered   12/08/22 0000  Change dressing       Comments: Maintain surgical dressing until follow up in the clinic. If the edges start to pull up, may  reinforce with tape. If the dressing is no longer working, may remove and cover with gauze and tape, but must keep the area dry and clean.  Call with any questions or  concerns.   12/08/22 1308            Follow-up Information     Durene Romans, MD. Schedule an appointment as soon as possible for a visit in 2 week(s).   Specialty: Orthopedic Surgery Contact information: [address] [place] [address] [place] [address] [phone]                 Signed: Andrez Grime, PA-C Orthopaedic Surgery 12/09/2022, 9:00 AM

## 2022-12-09 NOTE — Plan of Care (Signed)

## 2022-12-09 NOTE — Progress Notes (Signed)
Subjective: 2 Days Post-Op Procedure(s) (LRB): TOTAL KNEE REVISION/TIBIAL COMPONENT (Left) Patient reports pain as mild.  No other c/o. Ready to go home today.  Objective: Vital signs in last 24 hours: Temp:  [97.9 F (36.6 C)-98.4 F (36.9 C)] 98.2 F (36.8 C) (06/01 0539) Pulse Rate:  [55-82] 57 (06/01 0539) Resp:  [17-18] 17 (06/01 0539) BP: (126-134)/(63-89) 131/89 (06/01 0539) SpO2:  [100 %] 100 % (06/01 0539)  Intake/Output from previous day: 05/31 0701 - 06/01 0700 In: 1704.9 [P.O.:840; I.V.:864.9] Out: -  Intake/Output this shift: Total I/O In: 120 [P.O.:120] Out: -   Recent Labs    12/08/22 0343 12/09/22 0323  HGB 11.4* 10.4*   Recent Labs    12/08/22 0343 12/09/22 0323  WBC 9.0 7.4  RBC 4.13 3.78*  HCT 34.6* 31.6*  PLT 233 232   Recent Labs    12/08/22 0343  NA 138  K 3.9  CL 104  CO2 27  BUN 18  CREATININE 0.76  GLUCOSE 122*  CALCIUM 8.9   No results for input(s): "LABPT", "INR" in the last 72 hours.  Neurologically intact ABD soft Neurovascular intact Sensation intact distally Intact pulses distally Dorsiflexion/Plantar flexion intact Incision: dressing C/D/I and no drainage No cellulitis present Compartment soft No sign of DVT   Assessment/Plan: 2 Days Post-Op Procedure(s) (LRB): TOTAL KNEE REVISION/TIBIAL COMPONENT (Left) Advance diet Up with therapy D/C IV fluids D/C to home today Discussed D/C instructions  [name] 12/09/2022, 8:58 AM

## 2022-12-12 DIAGNOSIS — R269 Unspecified abnormalities of gait and mobility: Secondary | ICD-10-CM | POA: Insufficient documentation

## 2022-12-13 DIAGNOSIS — M25562 Pain in left knee: Secondary | ICD-10-CM | POA: Diagnosis not present

## 2022-12-13 DIAGNOSIS — R269 Unspecified abnormalities of gait and mobility: Secondary | ICD-10-CM | POA: Diagnosis not present

## 2022-12-15 DIAGNOSIS — M25562 Pain in left knee: Secondary | ICD-10-CM | POA: Diagnosis not present

## 2022-12-15 DIAGNOSIS — R269 Unspecified abnormalities of gait and mobility: Secondary | ICD-10-CM | POA: Diagnosis not present

## 2022-12-18 DIAGNOSIS — M25562 Pain in left knee: Secondary | ICD-10-CM | POA: Diagnosis not present

## 2022-12-18 DIAGNOSIS — R269 Unspecified abnormalities of gait and mobility: Secondary | ICD-10-CM | POA: Diagnosis not present

## 2022-12-21 DIAGNOSIS — Z96652 Presence of left artificial knee joint: Secondary | ICD-10-CM | POA: Diagnosis not present

## 2022-12-21 DIAGNOSIS — M25562 Pain in left knee: Secondary | ICD-10-CM | POA: Diagnosis not present

## 2022-12-21 DIAGNOSIS — R269 Unspecified abnormalities of gait and mobility: Secondary | ICD-10-CM | POA: Diagnosis not present

## 2022-12-21 DIAGNOSIS — Z471 Aftercare following joint replacement surgery: Secondary | ICD-10-CM | POA: Diagnosis not present

## 2022-12-25 DIAGNOSIS — R269 Unspecified abnormalities of gait and mobility: Secondary | ICD-10-CM | POA: Diagnosis not present

## 2022-12-25 DIAGNOSIS — M25562 Pain in left knee: Secondary | ICD-10-CM | POA: Diagnosis not present

## 2022-12-28 DIAGNOSIS — M25562 Pain in left knee: Secondary | ICD-10-CM | POA: Diagnosis not present

## 2022-12-28 DIAGNOSIS — R269 Unspecified abnormalities of gait and mobility: Secondary | ICD-10-CM | POA: Diagnosis not present

## 2023-01-01 DIAGNOSIS — R269 Unspecified abnormalities of gait and mobility: Secondary | ICD-10-CM | POA: Diagnosis not present

## 2023-01-01 DIAGNOSIS — M25562 Pain in left knee: Secondary | ICD-10-CM | POA: Diagnosis not present

## 2023-01-04 DIAGNOSIS — R269 Unspecified abnormalities of gait and mobility: Secondary | ICD-10-CM | POA: Diagnosis not present

## 2023-01-04 DIAGNOSIS — M25562 Pain in left knee: Secondary | ICD-10-CM | POA: Diagnosis not present

## 2023-01-08 DIAGNOSIS — R269 Unspecified abnormalities of gait and mobility: Secondary | ICD-10-CM | POA: Diagnosis not present

## 2023-01-08 DIAGNOSIS — M25562 Pain in left knee: Secondary | ICD-10-CM | POA: Diagnosis not present

## 2023-01-10 DIAGNOSIS — R269 Unspecified abnormalities of gait and mobility: Secondary | ICD-10-CM | POA: Diagnosis not present

## 2023-01-10 DIAGNOSIS — M25562 Pain in left knee: Secondary | ICD-10-CM | POA: Diagnosis not present

## 2023-01-15 ENCOUNTER — Ambulatory Visit: Payer: Medicare Other | Admitting: Urology

## 2023-01-15 DIAGNOSIS — M25562 Pain in left knee: Secondary | ICD-10-CM | POA: Diagnosis not present

## 2023-01-15 DIAGNOSIS — R269 Unspecified abnormalities of gait and mobility: Secondary | ICD-10-CM | POA: Diagnosis not present

## 2023-01-18 DIAGNOSIS — M25562 Pain in left knee: Secondary | ICD-10-CM | POA: Diagnosis not present

## 2023-01-18 DIAGNOSIS — R269 Unspecified abnormalities of gait and mobility: Secondary | ICD-10-CM | POA: Diagnosis not present

## 2023-01-22 DIAGNOSIS — R269 Unspecified abnormalities of gait and mobility: Secondary | ICD-10-CM | POA: Diagnosis not present

## 2023-01-22 DIAGNOSIS — M25562 Pain in left knee: Secondary | ICD-10-CM | POA: Diagnosis not present

## 2023-01-25 DIAGNOSIS — M25562 Pain in left knee: Secondary | ICD-10-CM | POA: Diagnosis not present

## 2023-01-25 DIAGNOSIS — R269 Unspecified abnormalities of gait and mobility: Secondary | ICD-10-CM | POA: Diagnosis not present

## 2023-01-26 DIAGNOSIS — Z4789 Encounter for other orthopedic aftercare: Secondary | ICD-10-CM | POA: Diagnosis not present

## 2023-01-29 DIAGNOSIS — R269 Unspecified abnormalities of gait and mobility: Secondary | ICD-10-CM | POA: Diagnosis not present

## 2023-01-29 DIAGNOSIS — M25562 Pain in left knee: Secondary | ICD-10-CM | POA: Diagnosis not present

## 2023-02-05 ENCOUNTER — Other Ambulatory Visit (HOSPITAL_COMMUNITY): Payer: Self-pay

## 2023-02-13 ENCOUNTER — Ambulatory Visit: Payer: Medicare Other | Admitting: Urology

## 2023-02-22 ENCOUNTER — Other Ambulatory Visit (HOSPITAL_COMMUNITY): Payer: Self-pay | Admitting: Family Medicine

## 2023-02-22 DIAGNOSIS — Z1231 Encounter for screening mammogram for malignant neoplasm of breast: Secondary | ICD-10-CM

## 2023-02-22 DIAGNOSIS — Z0001 Encounter for general adult medical examination with abnormal findings: Secondary | ICD-10-CM | POA: Diagnosis not present

## 2023-02-22 DIAGNOSIS — G4700 Insomnia, unspecified: Secondary | ICD-10-CM | POA: Diagnosis not present

## 2023-03-01 ENCOUNTER — Ambulatory Visit (HOSPITAL_COMMUNITY)
Admission: RE | Admit: 2023-03-01 | Discharge: 2023-03-01 | Disposition: A | Discharge status: Home / Self Care | Payer: Medicare Other | Source: Ambulatory Visit | Attending: Family Medicine | Admitting: Family Medicine

## 2023-03-01 DIAGNOSIS — Z1231 Encounter for screening mammogram for malignant neoplasm of breast: Secondary | ICD-10-CM | POA: Diagnosis not present

## 2023-03-09 DIAGNOSIS — Z471 Aftercare following joint replacement surgery: Secondary | ICD-10-CM | POA: Diagnosis not present

## 2023-03-09 DIAGNOSIS — Z96652 Presence of left artificial knee joint: Secondary | ICD-10-CM | POA: Diagnosis not present

## 2023-03-28 DIAGNOSIS — Z23 Encounter for immunization: Secondary | ICD-10-CM | POA: Diagnosis not present

## 2023-05-22 ENCOUNTER — Ambulatory Visit: Payer: Medicare Other | Admitting: Urology

## 2023-06-14 DIAGNOSIS — E782 Mixed hyperlipidemia: Secondary | ICD-10-CM | POA: Diagnosis not present

## 2023-06-14 DIAGNOSIS — E7849 Other hyperlipidemia: Secondary | ICD-10-CM | POA: Diagnosis not present

## 2023-06-14 DIAGNOSIS — R7309 Other abnormal glucose: Secondary | ICD-10-CM | POA: Diagnosis not present

## 2023-06-14 DIAGNOSIS — I10 Essential (primary) hypertension: Secondary | ICD-10-CM | POA: Diagnosis not present

## 2023-06-18 DIAGNOSIS — G5621 Lesion of ulnar nerve, right upper limb: Secondary | ICD-10-CM | POA: Diagnosis not present

## 2023-06-18 DIAGNOSIS — M13832 Other specified arthritis, left wrist: Secondary | ICD-10-CM | POA: Diagnosis not present

## 2023-06-18 DIAGNOSIS — M25641 Stiffness of right hand, not elsewhere classified: Secondary | ICD-10-CM | POA: Diagnosis not present

## 2023-06-18 DIAGNOSIS — M13842 Other specified arthritis, left hand: Secondary | ICD-10-CM | POA: Diagnosis not present

## 2023-07-17 ENCOUNTER — Telehealth: Payer: Self-pay | Admitting: *Deleted

## 2023-07-17 NOTE — Telephone Encounter (Signed)
 Pt called and wanted to know if she was to continue to take Linzess  145mcg, because her prescription has run out and she was getting it at a discount threw [employer]. I informed her that she needs to continue to take Linzess  145mcg if it is working well for her and to come by the office and pick up a new application for Linzess  to get it at a discount.  Application is left a front desk for her.

## 2023-07-19 DIAGNOSIS — M1380 Other specified arthritis, unspecified site: Secondary | ICD-10-CM | POA: Insufficient documentation

## 2023-07-19 DIAGNOSIS — Z6841 Body Mass Index (BMI) 40.0 and over, adult: Secondary | ICD-10-CM | POA: Insufficient documentation

## 2023-07-19 DIAGNOSIS — M2550 Pain in unspecified joint: Secondary | ICD-10-CM | POA: Insufficient documentation

## 2023-07-19 DIAGNOSIS — M1990 Unspecified osteoarthritis, unspecified site: Secondary | ICD-10-CM | POA: Insufficient documentation

## 2023-07-19 NOTE — Progress Notes (Signed)
 Name: [name] DOB: [birth year] MRN: [id number]  History of Present Illness: [name] is a 72 y.o. female who presents today as a new patient at [facility]. All available relevant medical records have been reviewed.  - GU / GYN History includes: 1. OAB with urinary urgency and urge incontinence. 2. Rectocele.  Today: She reports chief complaint of urgency and occasional urge incontinence if she delays voiding. Denies bothersome urinary frequency. Reports rare nocturnal enuresis; wears pullups to bed just in case. Previously tried Myrbetriq  50 mg: discontinued due to inefficacy and side effect (increased blood pressure). She denies significant caffeine intake.  She denies dysuria, gross hematuria, straining to void, or sensations of incomplete emptying.  She denies history of kidney stones.     Fall Screening: Do you usually have a device to assist in your mobility? No   Medications: Current Outpatient Medications  Medication Sig Dispense Refill   acetaminophen  (TYLENOL ) 325 MG tablet Take 650 mg by mouth every 6 (six) hours as needed for moderate pain.     calcium citrate (CALCITRATE - DOSED IN MG ELEMENTAL CALCIUM) 950 (200 Ca) MG tablet Take 200 mg of elemental calcium by mouth daily.     Garlic (GARLIQUE PO) Take 1 tablet by mouth daily.     linaclotide  (LINZESS ) 145 MCG CAPS capsule Take 1 capsule (145 mcg total) by mouth daily before breakfast. 30 capsule 3   lisinopril -hydrochlorothiazide  (PRINZIDE ,ZESTORETIC ) 20-12.5 MG per tablet Take 1 tablet by mouth daily before breakfast.     methocarbamol  (ROBAXIN ) 500 MG tablet Take 1 tablet (500 mg total) by mouth every 6 (six) hours as needed for muscle spasms. 40 tablet 2   Multiple Vitamin (MULTIVITAMIN WITH MINERALS) TABS tablet Take 1 tablet by mouth daily.     Omega-3 Fatty Acids (FISH OIL) 1000 MG CAPS Take 1,000 mg by mouth daily.     polyethylene glycol (MIRALAX  / GLYCOLAX ) 17 g packet Take 17 g by  mouth 2 (two) times daily. 14 each 0   zolpidem  (AMBIEN ) 10 MG tablet Take 10 mg by mouth at bedtime.     docusate sodium  (COLACE) 100 MG capsule Take 1 capsule (100 mg total) by mouth 2 (two) times daily. (Patient not taking: Reported on 07/24/2023) 10 capsule 0   oxyCODONE  (OXY IR/ROXICODONE ) 5 MG immediate release tablet Take 1 tablet (5 mg total) by mouth every 4 (four) hours as needed for severe pain. (Patient not taking: Reported on 07/24/2023) 42 tablet 0   phentermine (ADIPEX-P) 37.5 MG tablet Take 37.5 mg by mouth daily before breakfast. (Patient not taking: Reported on 07/24/2023)     No current facility-administered medications for this visit.    Allergies: No Known Allergies  Past Medical History:  Diagnosis Date   Anxiety    Complication of anesthesia    difficult to wake up after anesthesia, itching after spinal   Constipation    Essential hypertension    GERD (gastroesophageal reflux disease)    Heart murmur    Hyperlipidemia    Insomnia    Obesity    Osteoarthritis    Superficial fungus infection of skin 11/25/2015   Type 2 diabetes mellitus (HCC)    patient denies   Urge incontinence 11/04/2014   Vertigo    Vitamin D  deficiency    Past Surgical History:  Procedure Laterality Date   ABDOMINAL HYSTERECTOMY  1989   BIOPSY  12/01/2021   Procedure: BIOPSY;  Surgeon: Jennifer Jennifer HERO, [name];  Location: [facility]  END[facility];  Service: Endoscopy;;   CARPAL TUNNEL RELEASE Right 06/2016   right middle finger fused; wire in right thumb   COLONOSCOPY  10/20/2002   normal rectum/left-sided diverticula   COLONOSCOPY  07/06/2011   Jennifer. Shaaron: [name] anal canal hemorrhoids, diminutive rectal and transverse colon polyps. 2 adenoma on path. Left-sided diverticulosis.   COLONOSCOPY N/A 04/21/2016   Surgeon: [name] Shaaron, [name];  diverticulosis in the sigmoid colon and descending colon, otherwise normal exam, redundant colon.  Recommended repeat in 5 years.   COLONOSCOPY WITH PROPOFOL  N/A  12/01/2021   Procedure: COLONOSCOPY WITH PROPOFOL ;  Surgeon: Jennifer Jennifer CHRISTELLA, [name];  Location: [facility];  Service: Endoscopy;  Laterality: N/A;  1:30pm   JOINT REPLACEMENT     PARTIAL HYSTERECTOMY  1987   SCAR DEBRIDEMENT OF TOTAL KNEE Left 06/28/2021   Procedure: Left knee open scar debridement versus patella revision;  Surgeon: [name];  Location: [facility];  Service: Orthopedics;  Laterality: Left;   TOE SURGERY     second toe on both feet, bunionectomy   TOTAL HIP ARTHROPLASTY  2001/2008   multiple surgeries on right hip including replacements. Two replacements and four dislocations.    TOTAL HIP ARTHROPLASTY  2010   left   TOTAL HIP REVISION  12/25/2011   Procedure: TOTAL HIP REVISION;  Surgeon: [name];  Location: [facility];  Service: Orthopedics;  Laterality: Right;   TOTAL KNEE ARTHROPLASTY  2008   left   TOTAL KNEE ARTHROPLASTY  09/04/2011   Procedure: TOTAL KNEE ARTHROPLASTY;  Surgeon: [name];  Location: [facility];  Service: Orthopedics;  Laterality: Right;   TOTAL KNEE REVISION Left 03/22/2021   Procedure: TOTAL KNEE REVISION;  Surgeon: [name];  Location: [facility];  Service: Orthopedics;  Laterality: Left;   TOTAL KNEE REVISION Left 12/07/2022   Procedure: TOTAL KNEE REVISION/TIBIAL COMPONENT;  Surgeon: [name];  Location: [facility];  Service: Orthopedics;  Laterality: Left;   TOTAL SHOULDER ARTHROPLASTY Left 10/08/2014   Procedure: LEFT TOTAL SHOULDER ARTHROPLASTY;  Surgeon: Jennifer [name];  Location: [facility];  Service: Orthopedics;  Laterality: Left;   TOTAL SHOULDER REPLACEMENT Left 10/08/2014   [name]   Family History  Problem Relation Age of Onset   Heart failure Mother        Died 62   Diabetes Mother    Hypertension Mother    Early death Father    Pneumonia Father    Alcohol abuse Father    Heart disease Brother    Hypertension Brother    Diabetes Brother    Hypertension Sister    Hypertension Sister    Sickle cell  trait Brother    GER disease Brother    Thyroid  disease Daughter    Heart failure Daughter    Heart attack Daughter    Heart disease Maternal Aunt    Heart disease Maternal Uncle    Colon cancer Neg Hx    Social History   Socioeconomic History   Marital status: Single    Spouse name: Not on file   Number of children: 1   Years of education: Not on file   Highest education level: Not on file  Occupational History    Employer: [employer]  Tobacco Use   Smoking status: Never   Smokeless tobacco: Never  Vaping Use   Vaping status: Never Used  Substance and Sexual Activity   Alcohol use: No   Drug use: No  Sexual activity: Not Currently    Birth control/protection: Surgical    Comment: hyst  Other Topics Concern   Not on file  Social History Narrative   Not on file   Social Drivers of Health   Financial Resource Strain: Low Risk  (10/17/2022)   Overall Financial Resource Strain (CARDIA)    Difficulty of Paying Living Expenses: Not hard at all  Food Insecurity: No Food Insecurity (12/07/2022)   Hunger Vital Sign    Worried About Running Out of Food in the Last Year: Never true    Ran Out of Food in the Last Year: Never true  Transportation Needs: No Transportation Needs (12/07/2022)   PRAPARE - Administrator, Civil Service (Medical): No    Lack of Transportation (Non-Medical): No  Physical Activity: Inactive (10/26/2022)   Exercise Vital Sign    Days of Exercise per Week: 0 days    Minutes of Exercise per Session: 0 min  Stress: No Stress Concern Present (10/17/2022)   [employer] of Occupational Health - Occupational Stress Questionnaire    Feeling of Stress : Only a little  Social Connections: Moderately Isolated (10/17/2022)   Social Connection and Isolation Panel [NHANES]    Frequency of Communication with Friends and Family: More than three times a week    Frequency of Social Gatherings with Friends and Family: Twice a week    Attends  Religious Services: More than 4 times per year    Active Member of [employer] or Organizations: No    Attends Banker Meetings: Never    Marital Status: Widowed  Intimate Partner Violence: Not At Risk (12/07/2022)   Humiliation, Afraid, Rape, and Kick questionnaire    Fear of Current or Ex-Partner: No    Emotionally Abused: No    Physically Abused: No    Sexually Abused: No    SUBJECTIVE  Review of Systems Constitutional: Patient denies any unintentional weight loss or change in strength lntegumentary: Patient denies any rashes or pruritus Eyes: Patient denies dry eyes ENT: Patient denies dry mouth Cardiovascular: Patient denies chest pain or syncope Respiratory: Patient denies shortness of breath Gastrointestinal: Patient reports chronic constipation (on Linzess  for that) Musculoskeletal: Patient denies muscle cramps or weakness Neurologic: Patient denies convulsions or seizures Allergic/Immunologic: Patient denies recent allergic reaction(s) Hematologic/Lymphatic: Patient denies bleeding tendencies Endocrine: Patient denies heat/cold intolerance  GU: As per HPI.  OBJECTIVE Vitals:   07/24/23 1029  BP: 124/73  Pulse: 81  Temp: 98.1 F (36.7 C)   There is no height or weight on file to calculate BMI.  Physical Examination Constitutional: No obvious distress; patient is non-toxic appearing  Cardiovascular: No visible lower extremity edema.  Respiratory: The patient does not have audible wheezing/stridor; respirations do not appear labored  Gastrointestinal: Abdomen non-distended Musculoskeletal: Normal ROM of UEs  Skin: No obvious rashes/open sores  Neurologic: CN 2-12 grossly intact Psychiatric: Answered questions appropriately with normal affect  Hematologic/Lymphatic/Immunologic: No obvious bruises or sites of spontaneous bleeding  UA: negative  PVR: 0 ml  ASSESSMENT Urinary urgency - Plan: Urinalysis, Routine w reflex microscopic, BLADDER SCAN AMB  NON-IMAGING  Urge incontinence - Plan: Urinalysis, Routine w reflex microscopic, BLADDER SCAN AMB NON-IMAGING  OAB (overactive bladder) - Plan: Urinalysis, Routine w reflex microscopic, BLADDER SCAN AMB NON-IMAGING  Prediabetes  We discussed the symptoms of overactive bladder (OAB), which include urinary urgency, frequency, nocturia, with or without urge incontinence.   While we may not know the exact etiology of OAB, several risk  factors can be identified.  - We discussed this patient's neurogenic risk factors for OAB-type symptoms including spinal stenosis.  - Possibly exacerbated by diuretic use (hydrochlorothiazide ).   We discussed the following management options in detail including potential benefits, risks, and side effects: Behavioral therapy: Modify fluid intake Decreasing bladder irritants (such as caffeine) Urge suppression strategies Bladder retraining / timed voiding Double voiding Medication(s): - Not a safe candidate for anticholinergic medications due to risk for side effects based on patient's age, comorbidities, and pre-existing chronic constipation.  - Already failed Myrbetriq .  3. For refractory cases: PTNS (posterior tibial nerve stimulation) Sacral neuromodulation trial (Medtronic lnterStim or Axonics implant) Bladder Botox injections  She decided to proceed with behavioral modifications.  We agreed to follow up on an as-needed basis. Pt verbalized understanding and agreement. All questions were answered.   PLAN Advised the following: 1. Minimize caffeine intake. 2. Work on timed voiding. 3. Return if symptoms worsen or fail to improve.  Orders Placed This Encounter  Procedures   Urinalysis, Routine w reflex microscopic   BLADDER SCAN AMB NON-IMAGING    It has been explained that the patient is to follow regularly with their PCP in addition to all other providers involved in their care and to follow instructions provided by these respective offices.  Patient advised to contact urology clinic if any urologic-pertaining questions, concerns, new symptoms or problems arise in the interim period.  Patient Instructions      Overactive bladder (OAB) overview for patients:  Symptoms may include: urinary urgency (gotta go feeling) urinary frequency (voiding >8 times per day) night time urination (nocturia) urge incontinence of urine (UUI)  While we do not know the exact etiology of OAB, several treatment options exist including:  Behavioral therapy: Reducing fluid intake Decreasing bladder stimulants (such as caffeine) and irritants (such as acidic food, spicy foods, alcohol) Urge suppression strategies Bladder retraining via timed voiding  Pelvic floor physical therapy  Medication(s) - can use one or both of the drug classes below. Anticholinergic / antimuscarinic medications:  Mechanism of action: Activate M3 receptors to reduce detrusor stimulation and increase bladder capacity   (parasympathetic nervous system). Effect: Relaxes the bladder to decrease overactivity, increase bladder storage capacity, and increase time between voids. Onset: Slow acting (may take 8-12 weeks to determine efficacy). Medications include: Vesicare (Solifenacin), Ditropan (Oxybutynin), Detrol (Tolterodine), Toviaz (Fesoterodine), Sanctura (Trospium), Urispas (Flavoxate), Enablex (Darifenacin), Bentyl (Dicyclomine), Levsin (Hyoscyamine ). Potential side effects include but are not limited to: Dry eyes, dry mouth, constipation, cognitive impairment, dementia risk with long term use, and urinary retention/ incomplete bladder emptying. Insurance companies generally prefer for patients to try 1-2 anticholinergic / antimuscarinic medications first due to low cost. Some exceptions are made based on patient-specific comorbidities / risk factors. Beta-3 agonist medications: Mechanism of action: Stimulates selective B3 adrenergic receptors to cause smooth muscle  bladder relaxation (sympathetic nervous system). Effect: Relaxes the bladder to decrease overactivity, increase bladder storage capacity, and increase time between voids. Onset: Slow acting (may take 8-12 weeks to determine efficacy). Medications include: Myrbetriq  (Mirabegron ) and Vibegron (Gemtesa). Potential side effects include but are not limited to: urinary retention / incomplete bladder emptying and elevated blood pressure (more likely to occur in individuals with pre-existing uncontrolled hypertension). These medications tend to be more expensive than the anticholinergic / antimuscarinic medications.   For patients with refractory OAB (if the above treatment options have been unsuccessful): Posterior tibial nerve stimulation (PTNS). Small acupuncture-type needle inserted near ankle with electric current to stimulate  bladder via posterior tibial nerve pathway. Initially requires 12 weekly in-office treatments lasting 30 minutes each; followed by monthly in-office treatments lasting 30 minutes each for 1 year.  Bladder Botox injections. How it is done: Typically done via in-office cystoscopy; sometimes done in the OR depending on the situation. The bladder is numbed with lidocaine  instilled via a catheter. Then the urologist injects Botox into the bladder muscle wall in about 20 locations. Causes local paralysis of the bladder muscle at the injection sites to reduce bladder muscle overactivity / spasms. The effect lasts for approximately 6 months and cannot be reversed once performed. Risks may included but are not limited to: infection, incomplete bladder emptying/ urinary retention, short term need for self-catheterization or indwelling catheter, and need for repeat therapy. There is a 5-12% chance of needing to catheterize with Botox - that usually resolves in a few months as the Botox wears off. Typically Botox injections would need to be repeated every 3-12 months since this is not a  permanent therapy.  Sacral neuromodulation trial (Medtronic lnterStim or Axonics implant). Sacral neuromodulation is FDA-approved for uncontrolled urinary urgency, urinary frequency, urinary urge incontinence, non-obstructive urinary retention, or fecal incontinence. It is not FDA-approved as a treatment for pain. The goal of this therapy is at least a 50% improvement in symptoms. It is NOT realistic to expect a 100% cure. This is a a 2-step outpatient procedure. After a successful test period, a permanent wire and generator are placed in the OR. We discussed the risk of infection. We reviewed the fact that about 30% of patients fail the test phase and are not candidates for permanent generator placement. During the 1-2 week trial phase, symptoms are documented by the patient to determine response. If patient gets at least a 50% improvement in symptoms, they may then proceed with Step 2. Step 1: Trial lead placement. Per physician discretion, may done one of two ways: Percutaneous nerve evaluation (PNE) in the [facility] urology office. Performed by urologist under local anesthesia (numbing the area with lidocaine ) using a spinal needle for placement of test wire, which usually stays in place for 5-7 days to determine therapy response. Test lead placement in OR under anesthesia. Usually stays in place 2 weeks to determine therapy response. > Step 2: Permanent implantation of sacral neuromodulation device, which is performed in the OR.  Sacral neuromodulation implants: All are conditionally MRI safe. Manufacturer: Medtronic Website: buffalodrycleaner.gl therapy/right-for-you.html Options: lnterStim X: Non-rechargeable. The battery lasts 10 years on average. lnterStim Micro: Rechargeable. The battery lasts 15 years on average and must be charged routinely. Approximately 50% smaller implant than lnterStim X implant.  Manufacturer:  Axonics Website: Findrealrelief.axonics.com Options: Non-rechargeable (Axonics F15): The battery lasts 15 years on average. Rechargeable (Axonics R20): The battery lasts 20 years on average and must be charged in office for about 1 hour every 6-10 months on average. Approximately 50% smaller implant than Axonics non-rechargeable implant.  Note: Generally the rechargeable devices are only advised for very small or thin patients who may not have sufficient adipose tissue to comfortably overlay the implanted device.  Suprapubic catheter (SP tube) placement. Only done in severely refractory OAB when all other options have failed or are not a viable treatment choice depending on patient factors. Involves placement of a catheter through the lower abdomen into the bladder to continuously drain the bladder into an external collection bag, which patient can then empty at their convenience every few hours. Done via an outpatient surgical procedure in the OR under  anesthesia. Risks may included but are not limited to: surgical site pain, infections, skin irritation / breakdown, chronic bacteriuria, symptomatic UTls. The SP tube must stay in place continuously. This is a reversible procedure however - the insertion site will close if catheter is removed for more than a few hours. The SP tube must be exchanged routinely every 4 weeks to prevent the catheter from becoming clogged with sediment. SP tube exchanges are typically performed at a urology nurse visit or by a home health nurse.   Electronically signed by:  Lauraine KYM Oz, MSN, FNP-C, CUNP 07/24/2023 10:56 AM

## 2023-07-24 ENCOUNTER — Encounter: Payer: Self-pay | Admitting: Urology

## 2023-07-24 ENCOUNTER — Ambulatory Visit: Payer: Medicare Other | Admitting: Urology

## 2023-07-24 VITALS — BP 124/73 | HR 81 | Temp 98.1°F

## 2023-07-24 DIAGNOSIS — N3281 Overactive bladder: Secondary | ICD-10-CM | POA: Diagnosis not present

## 2023-07-24 DIAGNOSIS — R3915 Urgency of urination: Secondary | ICD-10-CM | POA: Diagnosis not present

## 2023-07-24 DIAGNOSIS — N3941 Urge incontinence: Secondary | ICD-10-CM | POA: Diagnosis not present

## 2023-07-24 DIAGNOSIS — R7303 Prediabetes: Secondary | ICD-10-CM | POA: Insufficient documentation

## 2023-07-24 LAB — URINALYSIS, ROUTINE W REFLEX MICROSCOPIC
Bilirubin, UA: NEGATIVE
Glucose, UA: NEGATIVE
Ketones, UA: NEGATIVE
Leukocytes,UA: NEGATIVE
Nitrite, UA: NEGATIVE
Protein,UA: NEGATIVE
RBC, UA: NEGATIVE
Specific Gravity, UA: 1.025 (ref 1.005–1.030)
Urobilinogen, Ur: 0.2 mg/dL (ref 0.2–1.0)
pH, UA: 6 (ref 5.0–7.5)

## 2023-07-24 LAB — BLADDER SCAN AMB NON-IMAGING: Scan Result: 0

## 2023-07-24 NOTE — Patient Instructions (Signed)

## 2023-08-21 ENCOUNTER — Telehealth: Payer: Self-pay | Admitting: *Deleted

## 2023-08-21 NOTE — Telephone Encounter (Signed)
Received assistance approval letter from [facility] for Linzess. Thur the end of the year.

## 2023-09-11 DIAGNOSIS — G5621 Lesion of ulnar nerve, right upper limb: Secondary | ICD-10-CM | POA: Diagnosis not present

## 2023-09-11 DIAGNOSIS — M24021 Loose body in right elbow: Secondary | ICD-10-CM | POA: Diagnosis not present

## 2023-09-11 DIAGNOSIS — M19021 Primary osteoarthritis, right elbow: Secondary | ICD-10-CM | POA: Diagnosis not present

## 2023-09-11 DIAGNOSIS — M13842 Other specified arthritis, left hand: Secondary | ICD-10-CM | POA: Diagnosis not present

## 2023-12-14 DIAGNOSIS — Z96652 Presence of left artificial knee joint: Secondary | ICD-10-CM | POA: Diagnosis not present

## 2023-12-14 DIAGNOSIS — M7062 Trochanteric bursitis, left hip: Secondary | ICD-10-CM | POA: Diagnosis not present

## 2024-01-16 ENCOUNTER — Other Ambulatory Visit (HOSPITAL_COMMUNITY): Payer: Self-pay | Admitting: Family Medicine

## 2024-01-16 DIAGNOSIS — Z1231 Encounter for screening mammogram for malignant neoplasm of breast: Secondary | ICD-10-CM

## 2024-02-26 DIAGNOSIS — E782 Mixed hyperlipidemia: Secondary | ICD-10-CM | POA: Diagnosis not present

## 2024-02-26 DIAGNOSIS — R7309 Other abnormal glucose: Secondary | ICD-10-CM | POA: Diagnosis not present

## 2024-02-26 DIAGNOSIS — Z0001 Encounter for general adult medical examination with abnormal findings: Secondary | ICD-10-CM | POA: Diagnosis not present

## 2024-02-26 DIAGNOSIS — I10 Essential (primary) hypertension: Secondary | ICD-10-CM | POA: Diagnosis not present

## 2024-02-26 DIAGNOSIS — G4700 Insomnia, unspecified: Secondary | ICD-10-CM | POA: Diagnosis not present

## 2024-03-03 ENCOUNTER — Ambulatory Visit (HOSPITAL_COMMUNITY)
Admission: RE | Admit: 2024-03-03 | Discharge: 2024-03-03 | Disposition: A | Discharge status: Home / Self Care | Source: Ambulatory Visit | Attending: Family Medicine | Admitting: Family Medicine

## 2024-03-03 DIAGNOSIS — Z1231 Encounter for screening mammogram for malignant neoplasm of breast: Secondary | ICD-10-CM | POA: Diagnosis not present

## 2024-04-08 DIAGNOSIS — M1991 Primary osteoarthritis, unspecified site: Secondary | ICD-10-CM | POA: Diagnosis not present

## 2024-04-08 DIAGNOSIS — M064 Inflammatory polyarthropathy: Secondary | ICD-10-CM | POA: Diagnosis not present

## 2024-04-08 DIAGNOSIS — M25532 Pain in left wrist: Secondary | ICD-10-CM | POA: Diagnosis not present

## 2024-04-16 ENCOUNTER — Emergency Department (HOSPITAL_COMMUNITY)
Admission: EM | Admit: 2024-04-16 | Discharge: 2024-04-16 | Disposition: A | Discharge status: Home / Self Care | Attending: Emergency Medicine | Admitting: Emergency Medicine

## 2024-04-16 ENCOUNTER — Ambulatory Visit: Admitting: Adult Health

## 2024-04-16 ENCOUNTER — Encounter: Payer: Self-pay | Admitting: Adult Health

## 2024-04-16 ENCOUNTER — Other Ambulatory Visit: Payer: Self-pay

## 2024-04-16 ENCOUNTER — Encounter (HOSPITAL_COMMUNITY): Payer: Self-pay

## 2024-04-16 VITALS — BP 122/77 | HR 76 | Ht 61.5 in | Wt 204.0 lb

## 2024-04-16 DIAGNOSIS — Z90710 Acquired absence of both cervix and uterus: Secondary | ICD-10-CM

## 2024-04-16 DIAGNOSIS — R1030 Lower abdominal pain, unspecified: Secondary | ICD-10-CM | POA: Insufficient documentation

## 2024-04-16 DIAGNOSIS — R350 Frequency of micturition: Secondary | ICD-10-CM

## 2024-04-16 DIAGNOSIS — R319 Hematuria, unspecified: Secondary | ICD-10-CM | POA: Diagnosis present

## 2024-04-16 DIAGNOSIS — R310 Gross hematuria: Secondary | ICD-10-CM | POA: Diagnosis not present

## 2024-04-16 DIAGNOSIS — R102 Pelvic and perineal pain unspecified side: Secondary | ICD-10-CM | POA: Insufficient documentation

## 2024-04-16 DIAGNOSIS — R1024 Suprapubic pain: Secondary | ICD-10-CM

## 2024-04-16 DIAGNOSIS — I10 Essential (primary) hypertension: Secondary | ICD-10-CM

## 2024-04-16 LAB — CBC WITH DIFFERENTIAL/PLATELET
Abs Immature Granulocytes: 0.03 K/uL (ref 0.00–0.07)
Basophils Absolute: 0 K/uL (ref 0.0–0.1)
Basophils Relative: 1 %
Eosinophils Absolute: 0 K/uL (ref 0.0–0.5)
Eosinophils Relative: 0 %
HCT: 39.6 % (ref 36.0–46.0)
Hemoglobin: 13 g/dL (ref 12.0–15.0)
Immature Granulocytes: 0 %
Lymphocytes Relative: 21 %
Lymphs Abs: 1.8 K/uL (ref 0.7–4.0)
MCH: 28.4 pg (ref 26.0–34.0)
MCHC: 32.8 g/dL (ref 30.0–36.0)
MCV: 86.7 fL (ref 80.0–100.0)
Monocytes Absolute: 0.5 K/uL (ref 0.1–1.0)
Monocytes Relative: 6 %
Neutro Abs: 6 K/uL (ref 1.7–7.7)
Neutrophils Relative %: 72 %
Platelets: 296 K/uL (ref 150–400)
RBC: 4.57 MIL/uL (ref 3.87–5.11)
RDW: 14.4 % (ref 11.5–15.5)
WBC: 8.3 K/uL (ref 4.0–10.5)
nRBC: 0 % (ref 0.0–0.2)

## 2024-04-16 LAB — BASIC METABOLIC PANEL WITH GFR
Anion gap: 13 (ref 5–15)
BUN: 17 mg/dL (ref 8–23)
CO2: 20 mmol/L — ABNORMAL LOW (ref 22–32)
Calcium: 10 mg/dL (ref 8.9–10.3)
Chloride: 104 mmol/L (ref 98–111)
Creatinine, Ser: 0.78 mg/dL (ref 0.44–1.00)
GFR, Estimated: >60 mL/min (ref >60)
Glucose, Bld: 106 mg/dL — ABNORMAL HIGH (ref 70–99)
Potassium: 4.1 mmol/L (ref 3.5–5.1)
Sodium: 138 mmol/L (ref 135–145)

## 2024-04-16 LAB — URINALYSIS, W/ REFLEX TO CULTURE (INFECTION SUSPECTED)
RBC / HPF: >50 RBC/hpf (ref 0–5)
Squamous Epithelial / HPF: NONE SEEN /HPF (ref 0–5)

## 2024-04-16 MED ORDER — CEPHALEXIN 500 MG PO CAPS: 500.0000 mg | ORAL_CAPSULE | Freq: Four times a day (QID) | ORAL | 0 refills | Status: DC

## 2024-04-16 NOTE — Progress Notes (Signed)
  Subjective:     Patient ID: [name], female   DOB: 10-03-1950, 73 y.o.   MRN: [id number]  HPI [name] is a 73 year old black female, single, sp hysterectomy in complaining of blood in urine, that started this morning and urinary frequency with pelvic pressure at end of stream of urine. She was seen in ER this morning, was prescribed kelfex and told to see GYN or PCP. She has not gotten Keflex yet.  PCP is Dr Marvine  Review of Systems +blood in urine, that started this morning and urinary frequency with pelvic pressure at end of stream of urine. She was seen in ER this morning, was prescribed kelfex and told to see GYN or PCP. She has not gotten Keflex yet.   Pain is a 10 at end of urine stream She is not sexually active Reviewed past medical,surgical, social and family history. Reviewed medications and allergies.  Objective:   Physical Exam BP 122/77 (BP Location: Left Arm, Patient Position: Sitting, Cuff Size: Normal)   Pulse 76   Ht 5' 1.5 (1.562 m)   Wt 204 lb (92.5 kg)   BMI 37.92 kg/m  Urine +frank blood, dipstick was 2+protein, 1+ leuks and 3+blood, no odor noted. Skin warm and dry.Pelvic: external genitalia is normal in appearance no lesions, vagina: pale, no blood or lesions,urethra has no lesions or masses noted, cervix and uterus are absent,adnexa: no masses or tenderness noted. Bladder is +pressure/pain suprapubic area, and no masses felt  No CVAT    Fall risk is high  Upstream - 04/16/24 1423       Pregnancy Intention Screening   Does the patient want to become pregnant in the next year? N/A    Does the patient's partner want to become pregnant in the next year? N/A    Would the patient like to discuss contraceptive options today? N/A      Contraception Wrap Up   Current Method Female Sterilization   hyst   End Method Female Sterilization   hyst   Contraception Counseling Provided No         Examination chaperoned by Clarita Salt LPN  Assessment:     1.  Dempsey hematuria (Primary) +frank hematuria, no history of kidney stone UA C&S sent Get keflex prescribed in ER and push fluids  I gave her a Nun's cap and sterile container to see if passes a kidney stone  - Urine Culture - Urinalysis, Routine w reflex microscopic  2. Urinary frequency +UF - Urine Culture - Urinalysis, Routine w reflex microscopic  3. Pelvic pressure in female +pressure esp at end of urine stream - Urine Culture - Urinalysis, Routine w reflex microscopic  4. S/P hysterectomy  5. Suprapubic pain +pressure.pain over bladder Will get CT of abd/pelvic with contrast at [facility] 04/18/24 at 5:30 pm to rule out kidney stone, discussed with Dr Jayne Will get Maurilio to precert  CT If pain increases, can't pee or starts to run a fever go back to ER  - CT ABDOMEN PELVIS W CONTRAST; Future  6. Hypertension, unspecified type Take BP meds and follow up with PCP     Plan:     Follow up prn

## 2024-04-16 NOTE — ED Triage Notes (Signed)
 Patient come in POV stated about 12:30am she noted blood in urine, and some clots in the water . Stated it became more blood so I wanted to see what was wrong, having pressure to pelvic area that started before I came. Hx of hysterectomy.

## 2024-04-16 NOTE — Discharge Instructions (Signed)
 Drink plenty of water .  Follow-up with either your family doctor or your GYN doctor after you finish your antibiotics.  Return if any more problems

## 2024-04-16 NOTE — ED Provider Notes (Signed)
 [place] [facility] AT [facility]  Provider Note  CSN: [id number] Arrival date & time: 04/16/24 9443  History Chief Complaint  Patient presents with   Hematuria    [name] is a 73 y.o. female with history of RA, recently started on a course of prednisone for flare reports she noticed blood in her urine around 0030hrs, persisted through the night, occasional small clots, some lower abdomen pressure but no burning, flank pain, fever, N/V. No history of similar.    Home Medications Prior to Admission medications   Medication Sig Start Date End Date Taking? Authorizing Provider  acetaminophen  (TYLENOL ) 325 MG tablet Take 650 mg by mouth every 6 (six) hours as needed for moderate pain.    [provider]  calcium citrate (CALCITRATE - DOSED IN MG ELEMENTAL CALCIUM) 950 (200 Ca) MG tablet Take 200 mg of elemental calcium by mouth daily.    [provider]  docusate sodium  (COLACE) 100 MG capsule Take 1 capsule (100 mg total) by mouth 2 (two) times daily. Patient not taking: Reported on 07/24/2023 03/23/21   Patti Rosina SAUNDERS, PA-C  Garlic (GARLIQUE PO) Take 1 tablet by mouth daily.    [provider]  linaclotide  (LINZESS ) 145 MCG CAPS capsule Take 1 capsule (145 mcg total) by mouth daily before breakfast. 09/30/21   Rudy Josette RAMAN, PA-C  lisinopril -hydrochlorothiazide  (PRINZIDE ,ZESTORETIC ) 20-12.5 MG per tablet Take 1 tablet by mouth daily before breakfast.    [provider]  methocarbamol  (ROBAXIN ) 500 MG tablet Take 1 tablet (500 mg total) by mouth every 6 (six) hours as needed for muscle spasms. 12/08/22   Patti Rosina SAUNDERS, PA-C  Multiple Vitamin (MULTIVITAMIN WITH MINERALS) TABS tablet Take 1 tablet by mouth daily.    [provider]  Omega-3 Fatty Acids (FISH OIL) 1000 MG CAPS Take 1,000 mg by mouth daily.    [provider]  oxyCODONE  (OXY IR/ROXICODONE ) 5 MG immediate release tablet Take 1 tablet (5 mg  total) by mouth every 4 (four) hours as needed for severe pain. Patient not taking: Reported on 07/24/2023 12/08/22   Patti Rosina SAUNDERS, PA-C  phentermine (ADIPEX-P) 37.5 MG tablet Take 37.5 mg by mouth daily before breakfast. Patient not taking: Reported on 07/24/2023    [provider]  polyethylene glycol (MIRALAX  / GLYCOLAX ) 17 g packet Take 17 g by mouth 2 (two) times daily. 12/08/22   Patti Rosina SAUNDERS, PA-C  zolpidem  (AMBIEN ) 10 MG tablet Take 10 mg by mouth at bedtime. 11/18/21   [provider]     Allergies    Patient has no known allergies.   Review of Systems   Review of Systems Please see HPI for pertinent positives and negatives  Physical Exam BP (!) 131/57   Pulse (!) 52   Temp 99.5 F (37.5 C) (Oral)   Resp 18   Ht 5' 1 (1.549 m)   Wt 94.8 kg   SpO2 97%   BMI 39.49 kg/m   Physical Exam Vitals and nursing note reviewed.  Constitutional:      Appearance: Normal appearance.  HENT:     Head: Normocephalic and atraumatic.     Nose: Nose normal.     Mouth/Throat:     Mouth: Mucous membranes are moist.  Eyes:     Extraocular Movements: Extraocular movements intact.     Conjunctiva/sclera: Conjunctivae normal.  Cardiovascular:     Rate and Rhythm: Normal rate.  Pulmonary:     Effort: Pulmonary  effort is normal.     Breath sounds: Normal breath sounds.  Abdominal:     General: Abdomen is flat.     Palpations: Abdomen is soft.     Tenderness: There is no abdominal tenderness. There is no guarding.  Musculoskeletal:        General: No swelling. Normal range of motion.     Cervical back: Neck supple.  Skin:    General: Skin is warm and dry.  Neurological:     General: No focal deficit present.     Mental Status: She is alert.  Psychiatric:        Mood and Affect: Mood normal.     ED Results / Procedures / Treatments   EKG None  Procedures Procedures  Medications Ordered in the ED Medications - No data to display  Initial  Impression and Plan  Patient here with gross hematuria, relatively painless but some pelvic pressure. Will check labs and reassess.   ED Course   Clinical Course as of 04/16/24 0715  Wed Apr 16, 2024  0715 Care signed out at shift change.  [CS]    Clinical Course User Index [CS] Roselyn Carlin NOVAK, MD     MDM Rules/Calculators/A&P Medical Decision Making Problems Addressed: Hematuria, unspecified type: acute illness or injury  Amount and/or Complexity of Data Reviewed Labs: ordered.     Final Clinical Impression(s) / ED Diagnoses Final diagnoses:  Hematuria, unspecified type    Rx / DC Orders ED Discharge Orders     None        Roselyn Carlin NOVAK, MD 04/16/24 [id number]

## 2024-04-17 LAB — URINALYSIS, ROUTINE W REFLEX MICROSCOPIC
Bilirubin, UA: NEGATIVE
Glucose, UA: NEGATIVE
Nitrite, UA: POSITIVE — AB
Specific Gravity, UA: 1.017 (ref 1.005–1.030)
Urobilinogen, Ur: 0.2 mg/dL (ref 0.2–1.0)
pH, UA: 6.5 (ref 5.0–7.5)

## 2024-04-17 LAB — MICROSCOPIC EXAMINATION
Casts: NONE SEEN /LPF
RBC, Urine: >30 /HPF — AB (ref 0–2)
WBC, UA: >30 /HPF — AB (ref 0–5)

## 2024-04-18 ENCOUNTER — Ambulatory Visit (HOSPITAL_COMMUNITY)
Admission: RE | Admit: 2024-04-18 | Discharge: 2024-04-18 | Disposition: A | Discharge status: Home / Self Care | Source: Ambulatory Visit | Attending: Adult Health | Admitting: Adult Health

## 2024-04-18 DIAGNOSIS — R1024 Suprapubic pain: Secondary | ICD-10-CM | POA: Diagnosis present

## 2024-04-18 LAB — URINE CULTURE: Culture: >=100000 — AB

## 2024-04-18 MED ORDER — IOHEXOL 300 MG/ML  SOLN
100.0000 mL | Freq: Once | INTRAMUSCULAR | Status: AC | PRN
  Administered 2024-04-18: 100 mL via INTRAVENOUS

## 2024-04-19 ENCOUNTER — Telehealth (HOSPITAL_BASED_OUTPATIENT_CLINIC_OR_DEPARTMENT_OTHER): Payer: Self-pay | Admitting: *Deleted

## 2024-04-19 LAB — URINE CULTURE

## 2024-04-19 NOTE — Telephone Encounter (Signed)
 Post ED Visit - Positive Culture Follow-up  Culture report reviewed by antimicrobial stewardship pharmacist: [facility] []  [place]in [place], Pharm.D. []  Venetia Gully, Pharm.D., BCPS AQ-ID []  Garrel Crews, Pharm.D., BCPS []  Almarie Lunger, [address].D., BCPS []  [place], [address].D., BCPS, AAHIVP []  Rosaline Bihari, Pharm.D., BCPS, AAHIVP []  Vernell Meier, PharmD, BCPS []  Latanya Hint, PharmD, BCPS []  Donald Medley, PharmD, BCPS []  Rocky Bold, PharmD []  Dorothyann Alert, PharmD, BCPS [x]  Dorn Poot, PharmD  [facility] []  Rosaline Edison, PharmD []  Romona Bliss, PharmD []  Dolphus Roller, PharmD []  Veva Seip, Rph []  Vernell Daunt) Leonce, PharmD []  Eva Allis, PharmD []  Rosaline Millet, PharmD []  Iantha Batch, PharmD []  Arvin Gauss, PharmD []  Wanda Hasting, PharmD []  Ronal Rav, PharmD []  Rocky Slade, PharmD []  Bard Jeans, PharmD   Positive urine culture Treated with Cephalexin, organism sensitive to the same and no further patient follow-up is required at this time.  Albino Alan Novak 04/19/2024, 11:38 AM

## 2024-04-21 ENCOUNTER — Ambulatory Visit: Payer: Self-pay | Admitting: Adult Health

## 2024-04-21 DIAGNOSIS — R935 Abnormal findings on diagnostic imaging of other abdominal regions, including retroperitoneum: Secondary | ICD-10-CM

## 2024-04-21 MED ORDER — SULFAMETHOXAZOLE-TRIMETHOPRIM 800-160 MG PO TABS
1.0000 | ORAL_TABLET | Freq: Two times a day (BID) | ORAL | 0 refills | Status: DC
Start: 2024-04-21 — End: 2024-06-03

## 2024-04-21 NOTE — Telephone Encounter (Signed)
 I called pt and discussed CT scan and the need for more testing, will get PET-CT scheduled, she knows that I discussed with Dr Jayne this afternoon. I will call her back tomorrow, (she is taking her keflex, so no need for septra ds now, her urine has no blood now)

## 2024-04-22 ENCOUNTER — Telehealth: Payer: Self-pay | Admitting: Adult Health

## 2024-04-22 ENCOUNTER — Other Ambulatory Visit: Payer: Self-pay | Admitting: Adult Health

## 2024-04-22 DIAGNOSIS — R1900 Intra-abdominal and pelvic swelling, mass and lump, unspecified site: Secondary | ICD-10-CM

## 2024-04-22 DIAGNOSIS — R935 Abnormal findings on diagnostic imaging of other abdominal regions, including retroperitoneum: Secondary | ICD-10-CM | POA: Insufficient documentation

## 2024-04-22 NOTE — Telephone Encounter (Signed)
 Left message that [name] will call to schedule the PET scan

## 2024-04-22 NOTE — Progress Notes (Signed)
 Order sent for PET

## 2024-05-08 ENCOUNTER — Encounter (HOSPITAL_COMMUNITY)
Admission: RE | Admit: 2024-05-08 | Discharge: 2024-05-08 | Disposition: A | Discharge status: Home / Self Care | Source: Ambulatory Visit | Attending: Adult Health | Admitting: Adult Health

## 2024-05-08 DIAGNOSIS — I700 Atherosclerosis of aorta: Secondary | ICD-10-CM | POA: Insufficient documentation

## 2024-05-08 DIAGNOSIS — I2510 Atherosclerotic heart disease of native coronary artery without angina pectoris: Secondary | ICD-10-CM | POA: Insufficient documentation

## 2024-05-08 DIAGNOSIS — R590 Localized enlarged lymph nodes: Secondary | ICD-10-CM | POA: Insufficient documentation

## 2024-05-08 DIAGNOSIS — R1900 Intra-abdominal and pelvic swelling, mass and lump, unspecified site: Secondary | ICD-10-CM | POA: Insufficient documentation

## 2024-05-08 MED ORDER — FLUDEOXYGLUCOSE F - 18 (FDG) INJECTION
10.1400 | Freq: Once | INTRAVENOUS | Status: AC | PRN
  Administered 2024-05-08: 10.14 via INTRAVENOUS

## 2024-05-12 ENCOUNTER — Ambulatory Visit: Payer: Self-pay | Admitting: Adult Health

## 2024-05-12 DIAGNOSIS — R948 Abnormal results of function studies of other organs and systems: Secondary | ICD-10-CM | POA: Insufficient documentation

## 2024-05-12 NOTE — Telephone Encounter (Signed)
 Pt aware that PET scan showed lymph node in abd retroperitoneal area that is worrisome for lymphoma, will refer to hematology/oncology at [facility].

## 2024-05-16 ENCOUNTER — Encounter: Payer: Self-pay | Admitting: Radiology

## 2024-05-16 ENCOUNTER — Inpatient Hospital Stay: Attending: Oncology | Admitting: Oncology

## 2024-05-16 ENCOUNTER — Inpatient Hospital Stay

## 2024-05-16 VITALS — BP 113/82 | HR 92 | Temp 98.0°F | Resp 18 | Wt 201.5 lb

## 2024-05-16 DIAGNOSIS — B9620 Unspecified Escherichia coli [E. coli] as the cause of diseases classified elsewhere: Secondary | ICD-10-CM | POA: Diagnosis present

## 2024-05-16 DIAGNOSIS — E559 Vitamin D deficiency, unspecified: Secondary | ICD-10-CM | POA: Diagnosis not present

## 2024-05-16 DIAGNOSIS — I10 Essential (primary) hypertension: Secondary | ICD-10-CM | POA: Diagnosis not present

## 2024-05-16 DIAGNOSIS — E119 Type 2 diabetes mellitus without complications: Secondary | ICD-10-CM | POA: Insufficient documentation

## 2024-05-16 DIAGNOSIS — N390 Urinary tract infection, site not specified: Secondary | ICD-10-CM | POA: Insufficient documentation

## 2024-05-16 DIAGNOSIS — E785 Hyperlipidemia, unspecified: Secondary | ICD-10-CM | POA: Insufficient documentation

## 2024-05-16 DIAGNOSIS — R591 Generalized enlarged lymph nodes: Secondary | ICD-10-CM | POA: Diagnosis not present

## 2024-05-16 DIAGNOSIS — K219 Gastro-esophageal reflux disease without esophagitis: Secondary | ICD-10-CM | POA: Insufficient documentation

## 2024-05-16 DIAGNOSIS — C569 Malignant neoplasm of unspecified ovary: Secondary | ICD-10-CM | POA: Diagnosis present

## 2024-05-16 DIAGNOSIS — R590 Localized enlarged lymph nodes: Secondary | ICD-10-CM | POA: Insufficient documentation

## 2024-05-16 DIAGNOSIS — R319 Hematuria, unspecified: Secondary | ICD-10-CM | POA: Diagnosis not present

## 2024-05-16 LAB — COMPREHENSIVE METABOLIC PANEL WITH GFR
ALT: 18 U/L (ref 0–44)
AST: 26 U/L (ref 15–41)
Albumin: 3.8 g/dL (ref 3.5–5.0)
Alkaline Phosphatase: 74 U/L (ref 38–126)
Anion gap: 12 (ref 5–15)
BUN: 14 mg/dL (ref 8–23)
CO2: 23 mmol/L (ref 22–32)
Calcium: 9.8 mg/dL (ref 8.9–10.3)
Chloride: 106 mmol/L (ref 98–111)
Creatinine, Ser: 0.82 mg/dL (ref 0.44–1.00)
GFR, Estimated: >60 mL/min (ref >60)
Glucose, Bld: 95 mg/dL (ref 70–99)
Potassium: 3.7 mmol/L (ref 3.5–5.1)
Sodium: 141 mmol/L (ref 135–145)
Total Bilirubin: 0.4 mg/dL (ref 0.0–1.2)
Total Protein: 7.6 g/dL (ref 6.5–8.1)

## 2024-05-16 LAB — CBC WITH DIFFERENTIAL/PLATELET
Abs Immature Granulocytes: 0.02 K/uL (ref 0.00–0.07)
Basophils Absolute: 0.1 K/uL (ref 0.0–0.1)
Basophils Relative: 1 %
Eosinophils Absolute: 0.1 K/uL (ref 0.0–0.5)
Eosinophils Relative: 2 %
HCT: 39.4 % (ref 36.0–46.0)
Hemoglobin: 12.7 g/dL (ref 12.0–15.0)
Immature Granulocytes: 0 %
Lymphocytes Relative: 36 %
Lymphs Abs: 1.8 K/uL (ref 0.7–4.0)
MCH: 27.9 pg (ref 26.0–34.0)
MCHC: 32.2 g/dL (ref 30.0–36.0)
MCV: 86.6 fL (ref 80.0–100.0)
Monocytes Absolute: 0.4 K/uL (ref 0.1–1.0)
Monocytes Relative: 8 %
Neutro Abs: 2.7 K/uL (ref 1.7–7.7)
Neutrophils Relative %: 53 %
Platelets: 527 K/uL — ABNORMAL HIGH (ref 150–400)
RBC: 4.55 MIL/uL (ref 3.87–5.11)
RDW: 14 % (ref 11.5–15.5)
WBC: 5.1 K/uL (ref 4.0–10.5)
nRBC: 0 % (ref 0.0–0.2)

## 2024-05-16 LAB — SEDIMENTATION RATE: Sed Rate: 37 mm/h — ABNORMAL HIGH (ref 0–30)

## 2024-05-16 LAB — LACTATE DEHYDROGENASE: LDH: 194 U/L — ABNORMAL HIGH (ref 98–192)

## 2024-05-16 LAB — C-REACTIVE PROTEIN: CRP: 0.6 mg/dL (ref <1.0)

## 2024-05-16 NOTE — Progress Notes (Signed)
 [facility] [phone]   [phone]  INITIAL CONSULTATION:  Patient Care Team: Patient, No Pcp Per as PCP - General (General Practice) Debera Jayson MATSU, MD as PCP - Cardiology (Cardiology) Shaaron, Lamar HERO, MD (Gastroenterology) Delores Dena RAMAN, RN as Oncology Nurse Navigator  CHIEF COMPLAINTS/PURPOSE OF CONSULTATION:  Enlarged lymph nodes  HISTORY OF PRESENTING ILLNESS:  [name] 73 y.o. female with medical history significant for hypertension, GERD, hyperlipidemia, obesity, osteoarthritis, type 2 diabetes, vitamin D  deficiency he was recently seen in the emergency room for hematuria.  Urinalysis/urine culture today for E. coli UTI and she was started on antibiotics.  CT abdomen pelvis was ordered on 04/18/2024 due to normal pressure which showed soft tissue in the GE junction and gastric fundus.  Mild abdominal retroperitoneal lymphadenopathy.  PET scan was ordered on 05/08/2024 which showed hypermetabolic abdominal retroperitoneal lymph nodes worrisome for lymphoma.  No abnormal hypermetabolic metabolism at the GE junction of proximal stomach.  She was referred to us  for further evaluation.  Most recent lab work from 04/16/2024 shows normal WBC, hemoglobin and differential.  CMP was also unremarkable.  On exam today patient reports she is nervous but overall feeling well.  Reports some shortness of breath at times but thinks it is due to anxiety over possible diagnosis.  Reports appetite is 50% energy levels are 40%.  She denies any pain.  Reports urinary symptoms are completely gone.  Has headache at times numbness and tingling in her hands and feet.  Has osteoarthritis and pain in her left hand and wrist at this time.  Reports she has had both knees, left shoulder and hips replaced over the years.  She is still having trouble with her left knee.  Reports chronic insomnia and is currently taking trazodone and Ambien .  Reports  she gets about 3 to 4 hours of sleep per night.  Reports her appetite is declined some over the past couple of weeks and she has developed some night sweats although she is not soaking through her sheets.  Her weight is down about 3 pounds since January.  Reports family history of no cancers but cardiovascular disease and diabetes.  MEDICAL HISTORY:  Past Medical History:  Diagnosis Date   Anxiety    Complication of anesthesia    difficult to wake up after anesthesia, itching after spinal   Constipation    Essential hypertension    GERD (gastroesophageal reflux disease)    Heart murmur    Hyperlipidemia    Insomnia    Obesity    Osteoarthritis    Superficial fungus infection of skin 11/25/2015   Type 2 diabetes mellitus (HCC)    patient denies   Urge incontinence 11/04/2014   Vertigo    Vitamin D  deficiency     SURGICAL HISTORY: Past Surgical History:  Procedure Laterality Date   ABDOMINAL HYSTERECTOMY  1989   BIOPSY  12/01/2021   Procedure: BIOPSY;  Surgeon: Shaaron Lamar HERO, MD;  [facility]: [facility];  Service: Endoscopy;;   CARPAL TUNNEL RELEASE Right 06/2016   right middle finger fused; wire in right thumb   COLONOSCOPY  10/20/2002   normal rectum/left-sided diverticula   COLONOSCOPY  07/06/2011   Dr. ShaaronBETHA Mom anal canal hemorrhoids, diminutive rectal and transverse colon polyps. 2 adenoma on path. Left-sided diverticulosis.   COLONOSCOPY N/A 04/21/2016   Surgeon: Lamar HERO Shaaron, MD;  diverticulosis in the sigmoid colon and descending colon, otherwise normal exam, redundant colon.  Recommended  repeat in 5 years.   COLONOSCOPY WITH PROPOFOL  N/A 12/01/2021   Procedure: COLONOSCOPY WITH PROPOFOL ;  Surgeon: Shaaron Lamar HERO, MD;  Location: [facility];  Service: Endoscopy;  Laterality: N/A;  1:30pm   JOINT REPLACEMENT     left hip revision Left 12/07/2022   PARTIAL HYSTERECTOMY  1987   SCAR DEBRIDEMENT OF TOTAL KNEE Left 06/28/2021   Procedure: Left knee open  scar debridement versus patella revision;  Surgeon: Ernie Cough, MD;  Location: [facility];  Service: Orthopedics;  Laterality: Left;   TOE SURGERY     second toe on both feet, bunionectomy   TOTAL HIP ARTHROPLASTY  2001/2008   multiple surgeries on right hip including replacements. Two replacements and four dislocations.    TOTAL HIP ARTHROPLASTY  2010   left   TOTAL HIP REVISION  12/25/2011   Procedure: TOTAL HIP REVISION;  Surgeon: Cough JONETTA Ernie, MD;  Location: [facility];  Service: Orthopedics;  Laterality: Right;   TOTAL KNEE ARTHROPLASTY  2008   left   TOTAL KNEE ARTHROPLASTY  09/04/2011   Procedure: TOTAL KNEE ARTHROPLASTY;  Surgeon: Cough JONETTA Ernie, MD;  Location: [facility];  Service: Orthopedics;  Laterality: Right;   TOTAL KNEE REVISION Left 03/22/2021   Procedure: TOTAL KNEE REVISION;  Surgeon: Ernie Cough, MD;  Location: [facility];  Service: Orthopedics;  Laterality: Left;   TOTAL KNEE REVISION Left 12/07/2022   Procedure: TOTAL KNEE REVISION/TIBIAL COMPONENT;  Surgeon: Ernie Cough, MD;  Location: [facility];  Service: Orthopedics;  Laterality: Left;   TOTAL SHOULDER ARTHROPLASTY Left 10/08/2014   Procedure: LEFT TOTAL SHOULDER ARTHROPLASTY;  Surgeon: Franky Pointer, MD;  Location: [facility];  Service: Orthopedics;  Laterality: Left;   TOTAL SHOULDER REPLACEMENT Left 10/08/2014   dr supple    SOCIAL HISTORY: Social History   Socioeconomic History   Marital status: Single    Spouse name: Not on file   Number of children: 1   Years of education: Not on file   Highest education level: Not on file  Occupational History    Employer: [employer]  Tobacco Use   Smoking status: Never   Smokeless tobacco: Never  Vaping Use   Vaping status: Never Used  Substance and Sexual Activity   Alcohol use: No   Drug use: No   Sexual activity: Not Currently    Birth control/protection: Surgical    Comment: hyst  Other Topics Concern   Not on file  Social History Narrative   Not on file    Social Drivers of Health   Financial Resource Strain: Low Risk  (10/17/2022)   Overall Financial Resource Strain (CARDIA)    Difficulty of Paying Living Expenses: Not hard at all  Food Insecurity: No Food Insecurity (12/07/2022)   Hunger Vital Sign    Worried About Running Out of Food in the Last Year: Never true    Ran Out of Food in the Last Year: Never true  Transportation Needs: No Transportation Needs (12/07/2022)   PRAPARE - Administrator, Civil Service (Medical): No    Lack of Transportation (Non-Medical): No  Physical Activity: Inactive (10/26/2022)   Exercise Vital Sign    Days of Exercise per Week: 0 days    Minutes of Exercise per Session: 0 min  Stress: No Stress Concern Present (10/17/2022)   [employer] of Occupational Health - Occupational Stress Questionnaire    Feeling of Stress : Only a little  Social Connections: Moderately Isolated (10/17/2022)   Social Connection  and Isolation Panel    Frequency of Communication with Friends and Family: More than three times a week    Frequency of Social Gatherings with Friends and Family: Twice a week    Attends Religious Services: More than 4 times per year    Active Member of [employer] or Organizations: No    Attends Banker Meetings: Never    Marital Status: Widowed  Intimate Partner Violence: Not At Risk (12/07/2022)   Humiliation, Afraid, Rape, and Kick questionnaire    Fear of Current or Ex-Partner: No    Emotionally Abused: No    Physically Abused: No    Sexually Abused: No    FAMILY HISTORY: Family History  Problem Relation Age of Onset   Early death Father    Pneumonia Father    Alcohol abuse Father    Heart failure Mother        Died 38   Diabetes Mother    Hypertension Mother    Heart disease Brother    Hypertension Brother    Diabetes Brother    Sickle cell trait Brother    GER disease Brother    Hypertension Sister    Hypertension Sister    Thyroid  disease Daughter    Heart  failure Daughter    Heart attack Daughter    Heart disease Maternal Aunt    Heart disease Maternal Uncle    Miscarriages / Stillbirths Son    Miscarriages / Stillbirths Son    Colon cancer Neg Hx     ALLERGIES:  has no known allergies.  MEDICATIONS:  Current Outpatient Medications  Medication Sig Dispense Refill   ACCU-CHEK GUIDE TEST test strip SMARTSIG:Strip(s)     acetaminophen  (TYLENOL ) 325 MG tablet Take 650 mg by mouth every 6 (six) hours as needed for moderate pain.     ASPIRIN  81 PO      calcium citrate (CALCITRATE - DOSED IN MG ELEMENTAL CALCIUM) 950 (200 Ca) MG tablet Take 200 mg of elemental calcium by mouth daily.     cephALEXin (KEFLEX) 500 MG capsule Take 1 capsule (500 mg total) by mouth 4 (four) times daily. (Patient not taking: Reported on 04/16/2024) 28 capsule 0   escitalopram (LEXAPRO) 10 MG tablet Take 10 mg by mouth daily.     linaclotide  (LINZESS ) 145 MCG CAPS capsule Take 1 capsule (145 mcg total) by mouth daily before breakfast. 30 capsule 3   lisinopril -hydrochlorothiazide  (PRINZIDE ,ZESTORETIC ) 20-12.5 MG per tablet Take 1 tablet by mouth daily before breakfast.     polyethylene glycol (MIRALAX  / GLYCOLAX ) 17 g packet Take 17 g by mouth 2 (two) times daily. 14 each 0   predniSONE (DELTASONE) 5 MG tablet Take by mouth.     rosuvastatin (CRESTOR) 10 MG tablet Take 10 mg by mouth daily.     sulfamethoxazole-trimethoprim (BACTRIM DS) 800-160 MG tablet Take 1 tablet by mouth 2 (two) times daily. Take 1 bid 14 tablet 0   traZODone (DESYREL) 50 MG tablet Take 50 mg by mouth at bedtime.     zolpidem  (AMBIEN ) 10 MG tablet Take 10 mg by mouth at bedtime.     No current facility-administered medications for this visit.    REVIEW OF SYSTEMS:   Review of Systems  Constitutional:  Positive for malaise/fatigue and weight loss.  Respiratory:  Positive for shortness of breath.   Musculoskeletal:  Positive for joint pain.  Neurological:  Positive for tingling, sensory  change and headaches.  Psychiatric/Behavioral:  The patient is nervous/anxious and has  insomnia.      PHYSICAL EXAMINATION: ECOG PERFORMANCE STATUS: 1 - Symptomatic but completely ambulatory  Vitals:   05/16/24 0834  BP: 113/82  Pulse: 92  Resp: 18  Temp: 98 F (36.7 C)  SpO2: 100%   Filed Weights   05/16/24 0834  Weight: 201 lb 8 oz (91.4 kg)    Physical Exam Constitutional:      Appearance: Normal appearance.  Cardiovascular:     Rate and Rhythm: Normal rate and regular rhythm.  Pulmonary:     Effort: Pulmonary effort is normal.     Breath sounds: Normal breath sounds.  Abdominal:     General: Bowel sounds are normal.     Palpations: Abdomen is soft.  Musculoskeletal:        General: No swelling. Normal range of motion.  Lymphadenopathy:     Head:     Right side of head: No submandibular or tonsillar adenopathy.     Left side of head: No submandibular or tonsillar adenopathy.     Cervical: No cervical adenopathy.     Right cervical: No superficial cervical adenopathy.    Left cervical: No superficial cervical adenopathy.     Upper Body:     Right upper body: No supraclavicular or axillary adenopathy.     Left upper body: No supraclavicular or axillary adenopathy.  Neurological:     Mental Status: She is alert and oriented to person, place, and time. Mental status is at baseline.      LABORATORY DATA:  I have reviewed the data as listed    Latest Ref Rng & Units 04/16/2024    6:32 AM 12/09/2022    3:23 AM 12/08/2022    3:43 AM  CBC  WBC 4.0 - 10.5 K/uL 8.3  7.4  9.0   Hemoglobin 12.0 - 15.0 g/dL 86.9  89.5  88.5   Hematocrit 36.0 - 46.0 % 39.6  31.6  34.6   Platelets 150 - 400 K/uL 296  232  233        Latest Ref Rng & Units 04/16/2024    6:32 AM 12/08/2022    3:43 AM 11/24/2022    1:26 PM  CMP  Glucose 70 - 99 mg/dL 893  877  94   BUN 8 - 23 mg/dL 17  18  13    Creatinine 0.44 - 1.00 mg/dL 9.21  9.23  9.18   Sodium 135 - 145 mmol/L 138  138  139    Potassium 3.5 - 5.1 mmol/L 4.1  3.9  3.7   Chloride 98 - 111 mmol/L 104  104  103   CO2 22 - 32 mmol/L 20  27  26    Calcium 8.9 - 10.3 mg/dL 89.9  8.9  89.9      RADIOGRAPHIC STUDIES: I have personally reviewed the radiological images as listed and agreed with the findings in the report. NM PET Image Initial (PI) Skull Base To Thigh (F-18 FDG) Result Date: 05/12/2024 CLINICAL DATA:  Initial treatment strategy for abdominal mass. EXAM: NUCLEAR MEDICINE PET SKULL BASE TO THIGH TECHNIQUE: 10.1 mCi F-18 FDG was injected intravenously. Full-ring PET imaging was performed from the skull base to thigh after the radiotracer. CT data was obtained and used for attenuation correction and anatomic localization. Fasting blood glucose: 121 mg/dl COMPARISON:  CT abdomen pelvis 04/18/2024. FINDINGS: Mediastinal blood pool activity: SUV max 2.8 Liver activity: SUV max NA NECK: No abnormal hypermetabolism. Incidental CT findings: None. CHEST: No abnormal hypermetabolism. Incidental CT findings: Atherosclerotic  calcification of the aorta, aortic valve and coronary arteries. Heart is mildly enlarged. No pericardial or pleural effusion. Calcified granulomas. ABDOMEN/PELVIS: Hypermetabolic abdominal retroperitoneal lymph nodes with index left periaortic lymph node measuring 1.6 cm (image 93), SUV max 10.1. No additional abnormal hypermetabolism. Incidental CT findings: Hepatic cysts. SKELETON: Likely inflammatory soft tissue uptake along the proximal left femur. No osseous hypermetabolism. Incidental CT findings: Bilateral hip arthroplasties. Degenerative changes in the spine. Left shoulder arthroplasty. IMPRESSION: 1. Hypermetabolic abdominal retroperitoneal lymph nodes, worrisome for lymphoma. 2. No abnormal hypermetabolism associated with the gastroesophageal junction or proximal stomach. 3. Aortic atherosclerosis (ICD10-I70.0). Coronary artery calcification. Electronically Signed   By: Newell Eke M.D.   On:  05/12/2024 11:33   CT ABDOMEN PELVIS W CONTRAST Result Date: 04/21/2024 CLINICAL DATA:  Abdominal, flank, and suprapubic pain. EXAM: CT ABDOMEN AND PELVIS WITH CONTRAST TECHNIQUE: Multidetector CT imaging of the abdomen and pelvis was performed using the standard protocol following bolus administration of intravenous contrast. RADIATION DOSE REDUCTION: This exam was performed according to the departmental dose-optimization program which includes automated exposure control, adjustment of the mA and/or kV according to patient size and/or use of iterative reconstruction technique. CONTRAST:  100mL OMNIPAQUE IOHEXOL 300 MG/ML  SOLN COMPARISON:  None Available. FINDINGS: Lower Chest: No acute findings. Hepatobiliary: Several benign-appearing hepatic cysts are noted. No suspicious hepatic masses identified. Gallbladder is unremarkable. No evidence of biliary ductal dilatation. Pancreas:  No mass or inflammatory changes. Spleen: Within normal limits in size and appearance. Adrenals/Urinary Tract: No suspicious masses identified. No evidence of ureteral calculi or hydronephrosis. Limited visualization of distal ureters and bladder noted due to severe artifact from bilateral hip prostheses. Stomach/Bowel: No evidence of bowel obstruction, inflammatory process or abnormal fluid collections. Soft tissue prominence is seen at the GE junction and gastric fundus. This may be due to underdistention although gastroesophageal carcinoma cannot be excluded. Diverticulosis is seen mainly involving the descending and sigmoid colon, however there is no evidence of diverticulitis. Vascular/Lymphatic: Mild abdominal retroperitoneal lymphadenopathy is seen in the left para-aortic and aortocaval spaces, with largest lymph node measuring 1.6 cm on image 27/2. No pelvic lymphadenopathy identified. No acute vascular findings. Reproductive: Limited visualization noted due to severe artifact from bilateral hip prostheses. No definite pelvic  mass or free fluid identified. Other:  None. Musculoskeletal: No suspicious bone lesions identified. Bilateral hip prostheses noted. Severe thoracolumbar spine degenerative changes are seen. IMPRESSION: Soft tissue prominence at the GE junction and gastric fundus. This may be due to underdistention, although gastroesophageal carcinoma cannot be excluded. Consider upper endoscopy for further evaluation. Mild abdominal retroperitoneal lymphadenopathy. Differential diagnosis includes metastatic disease and lymphoproliferative disorder. Recommend correlation for clinical signs of lymphoproliferative disorder, and consider PET-CT or tissue sampling. Colonic diverticulosis, without radiographic evidence of diverticulitis. Limited visualization through inferior pelvis due to severe artifact from bilateral hip prostheses. Electronically Signed   By: Norleen DELENA Kil M.D.   On: 04/21/2024 15:37    ASSESSMENT & PLAN Assessment & Plan Lymphadenopathy   #Lymphadenopathy: --Differentials include infectious process, inflammatory process, lymphoproliferative disorder or metastatic disease.  --Labs today to check CBC, CMP, LDH, flow cytometry, ESR and CRP levels -- Referral to IR for percutaneous biopsy. --RTC once workup is complete.    Orders Placed This Encounter  Procedures   CT Biopsy    Standing Status:   Future    Expected Date:   05/23/2024    Expiration Date:   05/16/2025    Lab orders requested (DO NOT place separate lab orders, these  will be automatically ordered during procedure specimen collection)::   Surgical Pathology    Reason for Exam (SYMPTOM  OR DIAGNOSIS REQUIRED):   retroperitoneal lymphadenopathy    Preferred location?:   [facility]    Release to patient:   Immediate   CBC with Differential    Standing Status:   Future    Number of Occurrences:   1    Expected Date:   05/16/2024    Expiration Date:   08/14/2024   Comprehensive metabolic panel    Standing Status:   Future     Number of Occurrences:   1    Expected Date:   05/16/2024    Expiration Date:   08/14/2024   Lactate dehydrogenase    Standing Status:   Future    Number of Occurrences:   1    Expected Date:   05/16/2024    Expiration Date:   08/14/2024   Sedimentation rate    Standing Status:   Future    Number of Occurrences:   1    Expected Date:   05/16/2024    Expiration Date:   08/14/2024   C-reactive protein    Standing Status:   Future    Number of Occurrences:   1    Expected Date:   05/16/2024    Expiration Date:   08/14/2024   Flow Cytometry, Peripheral Blood (Oncology)    Standing Status:   Future    Number of Occurrences:   1    Expected Date:   05/16/2024    Expiration Date:   05/16/2025    Release to patient:   Immediate    Release to patient:   Immediate [1]    All questions were answered. The patient knows to call the clinic with any problems, questions or concerns.  I have spent a total of 40 minutes minutes of face-to-face and non-face-to-face time, preparing to see the patient, obtaining and/or reviewing separately obtained history, performing a medically appropriate examination, counseling and educating the patient, ordering medications/tests/procedures, referring and communicating with other health care professionals, documenting clinical information in the electronic health record, independently interpreting results and communicating results to the patient, and care coordination.   Delon Hope, AGNP-C Department of Hematology/Oncology [facility]  Phone: [phone]

## 2024-05-16 NOTE — Patient Instructions (Signed)
 [facility]  Discharge Instructions  Rapid Diagnostic Service Visit Discharge Information and Instructions  Thank you for choosing [facility] for your healthcare needs.  Below is a summary of today's discussion, along with our contact information and an outline of what to expect next.  Reason for Visit:  retroperitoneal lymphadenopathy  Proposed Diagnostic Care Plan: Labs today. We will schedule you for a biopsy with Interventional Radiology.  What to Expect: - Generally, when lab tests are ordered the results can take up to 1 week for results to be available.  At that point, we will contact you to discuss your results with you.  Unless there is a critical result, we will typically wait for all of your lab results to be available before contacting you. - If a biopsy is part of your Care Plan, those results can take on average 7-10 days to result.  Once results are available, we will contact you to discuss your pathology results and any next steps. - If you have additional imaging ordered, such as a CT Scan, MRI, Ultrasound, Bone Scan, or PET scan, your imaging will need to be authorized then scheduled with the earliest available appointment.  You may be asked to travel to another hospital within [facility] who has a sooner availability, please consider doing so if asked. - If you use MyChart, your results will be available to you in the MyChart portal.  Your provider will be in touch with you as soon as all of your results are available to be discussed.  Your Diagnostic Clinic Provider:  Delon Hope, NP. Your Diagnostic Navigator:  Dena Daring, RN.  Contact number [phone].  If you or your caregiver have number blocking on your cell phones, please ensure the cancer center's numbers are not blocked.  If you are not a registered MyChart user, please consider enrolling in MyChart to receive your test results and visit notes.  You can also access your  discharge instructions electronically.  MyChart also gives you an electronic means to communicate with your Care Team instead of needing to call in to the cancer center.  We appreciate you trusting us  with your healthcare and look forward to partnering with you as we work to uncover what your potential diagnosis may be.  Please do not hesitate to reach out at any point with questions or concerns.      Thank you for choosing  [facility] to provide your oncology and hematology care.   To afford each patient quality time with our provider, please arrive at least 15 minutes before your scheduled appointment time. You may need to reschedule your appointment if you arrive late (10 or more minutes). Arriving late affects you and other patients whose appointments are after yours.  Also, if you miss three or more appointments without notifying the office, you may be dismissed from the clinic at the provider's discretion.    Again, thank you for choosing [facility].  Our hope is that these requests will decrease the amount of time that you wait before being seen by our physicians.   If you have a lab appointment with the Cancer Center - please note that after April 8th, all labs will be drawn in the cancer center.  You do not have to check in or register with the main entrance as you have in the past but will complete your check-in at the cancer center.  _____________________________________________________________  Should you have questions after your visit to [facility], please contact our office at [phone] and follow the prompts.  Our office hours are 8:00 a.m. to 4:30 p.m. Monday - Thursday and 8:00 a.m. to 2:30 p.m. Friday.  Please note that voicemails left after 4:00 p.m. may not be returned until the following business day.  We are closed weekends and all major holidays.  You do have access to a nurse 24-7, just call the main  number to the clinic [phone] and do not press any options, hold on the line and a nurse will answer the phone.    For prescription refill requests, have your pharmacy contact our office and allow 72 hours.    Masks are no longer required in the cancer centers. If you would like for your care team to wear a mask while they are taking care of you, please let them know. You may have one support person who is at least 72 years old accompany you for your appointments.

## 2024-05-16 NOTE — Progress Notes (Signed)
 Karalee Wilkie POUR, MD  Daralene Ferol FALCON, RT; Geofm Delon BRAVO, NP Approved for CT guided biopsy of LEFT RP LN (PET CT image 93 of 202).  Please note that node is relatively small and the aorta adjacent, samples may be scant as we will have a limited approach and window.  Depending on lymphoma type, we may not be able to get enough tissue for a dx.  Mod sedation.  HKM       Previous Messages    ----- Message ----- From: Daralene Ferol FALCON, RT Sent: 05/16/2024   9:11 AM EST To: Ir Procedure Requests Subject: CT Biopsy                                      Procedure :CT Biopsy  Reason : retroperitoneal lymphadenopathy Dx: Lymphadenopathy [R59.1 (ICD-10-CM)]   History :NM PET Image Initial (PI) Skull Base To Thigh (F-18 FDG) (Accession [id number]) (Order [id number]), CT ABDOMEN PELVIS W CONTRAST (Accession [id number]) (Order [id number])  Provider: Geofm Delon BRAVO, NP  Provider contact ; [phone]

## 2024-05-19 LAB — SURGICAL PATHOLOGY

## 2024-05-20 LAB — FLOW CYTOMETRY

## 2024-05-22 ENCOUNTER — Other Ambulatory Visit: Payer: Self-pay

## 2024-05-22 DIAGNOSIS — Z01818 Encounter for other preprocedural examination: Secondary | ICD-10-CM

## 2024-05-23 ENCOUNTER — Ambulatory Visit (HOSPITAL_COMMUNITY)
Admission: RE | Admit: 2024-05-23 | Discharge: 2024-05-23 | Disposition: A | Discharge status: Home / Self Care | Source: Ambulatory Visit | Attending: Oncology | Admitting: Oncology

## 2024-05-23 ENCOUNTER — Other Ambulatory Visit: Payer: Self-pay

## 2024-05-23 DIAGNOSIS — E785 Hyperlipidemia, unspecified: Secondary | ICD-10-CM | POA: Diagnosis not present

## 2024-05-23 DIAGNOSIS — R590 Localized enlarged lymph nodes: Secondary | ICD-10-CM | POA: Diagnosis not present

## 2024-05-23 DIAGNOSIS — E669 Obesity, unspecified: Secondary | ICD-10-CM | POA: Diagnosis present

## 2024-05-23 DIAGNOSIS — I700 Atherosclerosis of aorta: Secondary | ICD-10-CM | POA: Diagnosis not present

## 2024-05-23 DIAGNOSIS — I10 Essential (primary) hypertension: Secondary | ICD-10-CM | POA: Insufficient documentation

## 2024-05-23 DIAGNOSIS — Z79899 Other long term (current) drug therapy: Secondary | ICD-10-CM | POA: Insufficient documentation

## 2024-05-23 DIAGNOSIS — E119 Type 2 diabetes mellitus without complications: Secondary | ICD-10-CM | POA: Diagnosis present

## 2024-05-23 DIAGNOSIS — I2510 Atherosclerotic heart disease of native coronary artery without angina pectoris: Secondary | ICD-10-CM | POA: Diagnosis not present

## 2024-05-23 DIAGNOSIS — R319 Hematuria, unspecified: Secondary | ICD-10-CM | POA: Diagnosis not present

## 2024-05-23 DIAGNOSIS — R591 Generalized enlarged lymph nodes: Secondary | ICD-10-CM

## 2024-05-23 DIAGNOSIS — Z01818 Encounter for other preprocedural examination: Secondary | ICD-10-CM

## 2024-05-23 LAB — CBC
HCT: 40.5 % (ref 36.0–46.0)
Hemoglobin: 12.9 g/dL (ref 12.0–15.0)
MCH: 27.5 pg (ref 26.0–34.0)
MCHC: 31.9 g/dL (ref 30.0–36.0)
MCV: 86.4 fL (ref 80.0–100.0)
Platelets: 356 K/uL (ref 150–400)
RBC: 4.69 MIL/uL (ref 3.87–5.11)
RDW: 14.2 % (ref 11.5–15.5)
WBC: 4.6 K/uL (ref 4.0–10.5)
nRBC: 0 % (ref 0.0–0.2)

## 2024-05-23 LAB — PROTIME-INR
INR: 0.9 (ref 0.8–1.2)
Prothrombin Time: 13 s (ref 11.4–15.2)

## 2024-05-23 MED ORDER — MIDAZOLAM HCL 2 MG/2ML IJ SOLN
INTRAMUSCULAR | Status: AC
  Filled 2024-05-23: qty 2

## 2024-05-23 MED ORDER — MIDAZOLAM HCL (PF) 2 MG/2ML IJ SOLN
INTRAMUSCULAR | Status: AC | PRN
  Administered 2024-05-23: .5 mg via INTRAVENOUS
  Administered 2024-05-23: 1 mg via INTRAVENOUS

## 2024-05-23 MED ORDER — FENTANYL CITRATE (PF) 100 MCG/2ML IJ SOLN
INTRAMUSCULAR | Status: AC | PRN
  Administered 2024-05-23: 50 ug via INTRAVENOUS
  Administered 2024-05-23: 25 ug via INTRAVENOUS

## 2024-05-23 MED ORDER — SODIUM CHLORIDE 0.9 % IV SOLN: INTRAVENOUS | Status: DC

## 2024-05-23 MED ORDER — FENTANYL CITRATE (PF) 100 MCG/2ML IJ SOLN
INTRAMUSCULAR | Status: AC
  Filled 2024-05-23: qty 2

## 2024-05-23 NOTE — Progress Notes (Signed)
 Discharge instructions reviewed with patient and sister at bedside. Denies questions concerns. PT tolerated PO intake. Voided before discharge. Incision site remains clean dry and intact. No s/s of complications. PT escorted from the unit via wheel chair to personal vehicle.

## 2024-05-23 NOTE — Progress Notes (Signed)
 CT guided biopsy of Left side retroperitoneal lymph node performed by Dr. ONEIDA Specking. 18g/20cm coaxial bx system used with 5-6 samples of tissue sent to pathology. 1.5mg   of Versed  given with 75 of Fentanyl  mc For sedation.

## 2024-05-23 NOTE — Sedation Documentation (Signed)
 Following end of procedure while holding pressure on biopsy site, pt began endorsing feeling hot and weak. Drop in BP, see flowsheets.  Given fluids, transferred back to bed, BP began improving. Pt remained conscious throughout. Brought back to IR nursing bay for monitoring. BP continued to improve and pt stated feeling better.  Brought to short stay and report given to Page, Short Stay RN. Pt awake, no symptoms apart from fatigue, BP WDL.

## 2024-05-23 NOTE — Procedures (Signed)
 Interventional Radiology Procedure Note  Procedure: CT CORE BX LEFT RP ADENOPATHY    Complications: None  Estimated Blood Loss:  MIN  Findings: 18G CORES IN SALINE    EMERSON FREDERIC SPECKING, MD

## 2024-05-23 NOTE — H&P (Signed)
 Chief Complaint: Patient was seen in consultation today for lymphadenopathy  Referring Physician(s): Burns,Jennifer E  Supervising Physician: Vanice Revel  Patient Status: [name] - Out-pt  History of Present Illness: [name] is a 73 y.o. female with a medical history significant for HTN, obesity and DM2. She was recently evaluated in the ED for hematuria and CT imaging showed mild abdominal retroperitoneal lymphadenopathy. This was followed by a PET scan 05/08/24 which showed hypermetabolic activity concerning for lymphoma.   PET 05/08/24 IMPRESSION: 1. Hypermetabolic abdominal retroperitoneal lymph nodes, worrisome for lymphoma. 2. No abnormal hypermetabolism associated with the gastroesophageal junction or proximal stomach. 3. Aortic atherosclerosis (ICD10-I70.0). Coronary artery calcification  She was referred to Oncology for further work up and a percutaneous biopsy was recommended. Interventional Radiology has been asked to evaluate this patient for an image-guided lymph node biopsy. Imaging reviewed and patient approved for left retroperitoneal lymph node biopsy by Dr. Karalee.     Past Medical History:  Diagnosis Date   Anxiety    Complication of anesthesia    difficult to wake up after anesthesia, itching after spinal   Constipation    Essential hypertension    GERD (gastroesophageal reflux disease)    Heart murmur    Hyperlipidemia    Insomnia    Obesity    Osteoarthritis    Superficial fungus infection of skin 11/25/2015   Type 2 diabetes mellitus (HCC)    patient denies   Urge incontinence 11/04/2014   Vertigo    Vitamin D  deficiency     Past Surgical History:  Procedure Laterality Date   ABDOMINAL HYSTERECTOMY  1989   BIOPSY  12/01/2021   Procedure: BIOPSY;  Surgeon: Shaaron Lamar HERO, MD;  Location: [facility];  Service: Endoscopy;;   CARPAL TUNNEL RELEASE Right 06/2016   right middle finger fused; wire in right thumb   COLONOSCOPY   10/20/2002   normal rectum/left-sided diverticula   COLONOSCOPY  07/06/2011   Dr. ShaaronBETHA Mom anal canal hemorrhoids, diminutive rectal and transverse colon polyps. 2 adenoma on path. Left-sided diverticulosis.   COLONOSCOPY N/A 04/21/2016   Surgeon: Lamar HERO Shaaron, MD;  diverticulosis in the sigmoid colon and descending colon, otherwise normal exam, redundant colon.  Recommended repeat in 5 years.   COLONOSCOPY WITH PROPOFOL  N/A 12/01/2021   Procedure: COLONOSCOPY WITH PROPOFOL ;  Surgeon: Shaaron Lamar HERO, MD;  Location: [facility];  Service: Endoscopy;  Laterality: N/A;  1:30pm   JOINT REPLACEMENT     left hip revision Left 12/07/2022   PARTIAL HYSTERECTOMY  1987   SCAR DEBRIDEMENT OF TOTAL KNEE Left 06/28/2021   Procedure: Left knee open scar debridement versus patella revision;  Surgeon: Ernie Cough, MD;  Location: [facility];  Service: Orthopedics;  Laterality: Left;   TOE SURGERY     second toe on both feet, bunionectomy   TOTAL HIP ARTHROPLASTY  2001/2008   multiple surgeries on right hip including replacements. Two replacements and four dislocations.    TOTAL HIP ARTHROPLASTY  2010   left   TOTAL HIP REVISION  12/25/2011   Procedure: TOTAL HIP REVISION;  Surgeon: Cough JONETTA Ernie, MD;  Location: [facility];  Service: Orthopedics;  Laterality: Right;   TOTAL KNEE ARTHROPLASTY  2008   left   TOTAL KNEE ARTHROPLASTY  09/04/2011   Procedure: TOTAL KNEE ARTHROPLASTY;  Surgeon: Cough JONETTA Ernie, MD;  Location: [facility];  Service: Orthopedics;  Laterality: Right;   TOTAL KNEE REVISION Left 03/22/2021   Procedure: TOTAL KNEE REVISION;  Surgeon: Ernie Cough, MD;  Location: [facility];  Service: Orthopedics;  Laterality: Left;   TOTAL KNEE REVISION Left 12/07/2022   Procedure: TOTAL KNEE REVISION/TIBIAL COMPONENT;  Surgeon: Ernie Cough, MD;  Location: [facility];  Service: Orthopedics;  Laterality: Left;   TOTAL SHOULDER ARTHROPLASTY Left 10/08/2014   Procedure: LEFT TOTAL SHOULDER ARTHROPLASTY;   Surgeon: Franky Pointer, MD;  Location: [facility];  Service: Orthopedics;  Laterality: Left;   TOTAL SHOULDER REPLACEMENT Left 10/08/2014   dr supple    Allergies: Patient has no known allergies.  Medications: Prior to Admission medications   Medication Sig Start Date End Date Taking? Authorizing Provider  ACCU-CHEK GUIDE TEST test strip SMARTSIG:Strip(s) 05/13/24   [provider]  acetaminophen  (TYLENOL ) 325 MG tablet Take 650 mg by mouth every 6 (six) hours as needed for moderate pain.    [provider]  ASPIRIN  81 PO     [provider]  calcium citrate (CALCITRATE - DOSED IN MG ELEMENTAL CALCIUM) 950 (200 Ca) MG tablet Take 200 mg of elemental calcium by mouth daily.    [provider]  cephALEXin (KEFLEX) 500 MG capsule Take 1 capsule (500 mg total) by mouth 4 (four) times daily. Patient not taking: Reported on 04/16/2024 04/16/24   Zammit, Joseph, MD  escitalopram (LEXAPRO) 10 MG tablet Take 10 mg by mouth daily. 02/26/24   [provider]  linaclotide  (LINZESS ) 145 MCG CAPS capsule Take 1 capsule (145 mcg total) by mouth daily before breakfast. 09/30/21   Rudy Josette RAMAN, PA-C  lisinopril -hydrochlorothiazide  (PRINZIDE ,ZESTORETIC ) 20-12.5 MG per tablet Take 1 tablet by mouth daily before breakfast.    [provider]  polyethylene glycol (MIRALAX  / GLYCOLAX ) 17 g packet Take 17 g by mouth 2 (two) times daily. 12/08/22   Patti Rosina SAUNDERS, PA-C  predniSONE (DELTASONE) 5 MG tablet Take by mouth. 04/08/24   [provider]  rosuvastatin (CRESTOR) 10 MG tablet Take 10 mg by mouth daily. 02/28/24   [provider]  sulfamethoxazole-trimethoprim (BACTRIM DS) 800-160 MG tablet Take 1 tablet by mouth 2 (two) times daily. Take 1 bid 04/21/24   Griffin, Jennifer A, NP  traZODone (DESYREL) 50 MG tablet Take 50 mg by mouth at bedtime. 03/27/24   [provider]  zolpidem  (AMBIEN ) 10 MG tablet Take 10 mg by mouth at bedtime.  11/18/21   [provider]     Family History  Problem Relation Age of Onset   Early death Father    Pneumonia Father    Alcohol abuse Father    Heart failure Mother        Died 70   Diabetes Mother    Hypertension Mother    Heart disease Brother    Hypertension Brother    Diabetes Brother    Sickle cell trait Brother    GER disease Brother    Hypertension Sister    Hypertension Sister    Thyroid  disease Daughter    Heart failure Daughter    Heart attack Daughter    Heart disease Maternal Aunt    Heart disease Maternal Uncle    Miscarriages / Stillbirths Son    Miscarriages / Stillbirths Son    Colon cancer Neg Hx     Social History   Socioeconomic History   Marital status: Single    Spouse name: Not on file   Number of children: 1   Years of education: Not on file   Highest education level: Not on file  Occupational History  Employer: [employer]  Tobacco Use   Smoking status: Never   Smokeless tobacco: Never  Vaping Use   Vaping status: Never Used  Substance and Sexual Activity   Alcohol use: No   Drug use: No   Sexual activity: Not Currently    Birth control/protection: Surgical    Comment: hyst  Other Topics Concern   Not on file  Social History Narrative   Not on file   Social Drivers of Health   Financial Resource Strain: Low Risk  (10/17/2022)   Overall Financial Resource Strain (CARDIA)    Difficulty of Paying Living Expenses: Not hard at all  Food Insecurity: No Food Insecurity (12/07/2022)   Hunger Vital Sign    Worried About Running Out of Food in the Last Year: Never true    Ran Out of Food in the Last Year: Never true  Transportation Needs: No Transportation Needs (12/07/2022)   PRAPARE - Administrator, Civil Service (Medical): No    Lack of Transportation (Non-Medical): No  Physical Activity: Inactive (10/26/2022)   Exercise Vital Sign    Days of Exercise per Week: 0 days    Minutes of Exercise per Session:  0 min  Stress: No Stress Concern Present (10/17/2022)   [employer] of Occupational Health - Occupational Stress Questionnaire    Feeling of Stress : Only a little  Social Connections: Moderately Isolated (10/17/2022)   Social Connection and Isolation Panel    Frequency of Communication with Friends and Family: More than three times a week    Frequency of Social Gatherings with Friends and Family: Twice a week    Attends Religious Services: More than 4 times per year    Active Member of [employer] or Organizations: No    Attends Banker Meetings: Never    Marital Status: Widowed    Review of Systems: A 12 point ROS discussed and pertinent positives are indicated in the HPI above.  All other systems are negative.   Vital Signs: BP 105/70   Pulse (!) 51   Resp 16   Ht 5' 1 (1.549 m)   Wt 200 lb (90.7 kg)   SpO2 100%   BMI 37.79 kg/m   Physical Exam Vitals and nursing note reviewed.  Constitutional:      Appearance: Normal appearance.  HENT:     Mouth/Throat:     Mouth: Mucous membranes are moist.     Pharynx: Oropharynx is clear.  Cardiovascular:     Rate and Rhythm: Regular rhythm. Bradycardia present.  Pulmonary:     Effort: Pulmonary effort is normal.     Breath sounds: Normal breath sounds.  Abdominal:     Palpations: Abdomen is soft.     Tenderness: There is no abdominal tenderness.  Musculoskeletal:     Right lower leg: No edema.     Left lower leg: No edema.  Skin:    General: Skin is warm and dry.  Neurological:     Mental Status: She is alert and oriented to person, place, and time. Mental status is at baseline.     Labs:  CBC: Recent Labs    04/16/24 0632 05/16/24 0910  WBC 8.3 5.1  HGB 13.0 12.7  HCT 39.6 39.4  PLT 296 527*    COAGS: No results for input(s): INR, APTT in the last 8760 hours.  BMP: Recent Labs    04/16/24 0632 05/16/24 0910  NA 138 141  K 4.1 3.7  CL 104 106  CO2 20* 23  GLUCOSE 106* 95  BUN 17 14   CALCIUM 10.0 9.8  CREATININE 0.78 0.82  GFRNONAA >60 >60    LIVER FUNCTION TESTS: Recent Labs    05/16/24 0910  BILITOT 0.4  AST 26  ALT 18  ALKPHOS 74  PROT 7.6  ALBUMIN 3.8    TUMOR MARKERS: No results for input(s): AFPTM, CEA, CA199, CHROMGRNA in the last 8760 hours.  Assessment and Plan:  Lymphadenopathy with concern for lymphoma: Synia H. Swint, 73 year old female, presents today for an image-guided left retroperitoneal lymph node biopsy.   Risks and benefits of this procedure were discussed with the patient and/or patient's family including, but not limited to bleeding, infection, damage to adjacent structures or low yield requiring additional tests.  All of the questions were answered and there is agreement to proceed. She has been NPO. She does not take any blood-thinning medications.   Consent signed and in chart.  Thank you for this interesting consult.  I greatly enjoyed meeting CARLEAH YABLONSKI and look forward to participating in their care.  A copy of this report was sent to the requesting provider on this date.  Electronically Signed: Warren Dais, AGACNP-BC 05/23/2024, 9:00 AM   I spent a total of  30 Minutes   in face to face in clinical consultation, greater than 50% of which was counseling/coordinating care for lymphadenopathy

## 2024-05-23 NOTE — Progress Notes (Signed)
 Following end of procedure while holding pressure on biopsy site, pt began endorsing feeling hot and weak. Drop in BP, see flowsheets.  Given fluids, transferred back to bed, BP began improving. Pt remained conscious throughout. Brought back to IR nursing bay for monitoring. BP continued to improve and pt stated feeling better.  Brought to short stay and report given to Page, Short Stay RN. Pt awake, no symptoms apart from fatigue, BP WDL.

## 2024-05-26 NOTE — Progress Notes (Signed)
 [name] has a CT 04/18/24 that showed mild abdominal retroperitoneal lymphopathy with differential diagnosis includes metastatic disease and lymphoproliferative disease and the radiologist recommended the PET scan, she is seeing oncologist now and has had biopsy, but pathology is not back yet.

## 2024-05-27 LAB — SURGICAL PATHOLOGY

## 2024-05-28 ENCOUNTER — Ambulatory Visit: Payer: Self-pay | Admitting: Oncology

## 2024-05-28 NOTE — Progress Notes (Signed)
 CLAUDIUS Delon Hope, NP 05/28/2024 4:16 PM

## 2024-06-03 ENCOUNTER — Encounter: Payer: Self-pay | Admitting: Psychiatry

## 2024-06-03 ENCOUNTER — Telehealth: Payer: Self-pay

## 2024-06-03 ENCOUNTER — Inpatient Hospital Stay: Admitting: Oncology

## 2024-06-03 ENCOUNTER — Inpatient Hospital Stay

## 2024-06-03 VITALS — BP 126/65 | HR 58 | Temp 98.3°F | Resp 17 | Ht 61.5 in | Wt 202.0 lb

## 2024-06-03 DIAGNOSIS — C569 Malignant neoplasm of unspecified ovary: Secondary | ICD-10-CM | POA: Diagnosis not present

## 2024-06-03 NOTE — Telephone Encounter (Signed)
 Spoke with [name] regarding her referral to GYN oncology. She has an appointment scheduled with Dr. Eldonna on 06/09/24 at 9:00. Patient agrees to date and time. She has been provided with office address and location. She is also aware of our mask and visitor policy. Patient verbalized understanding and will call with any questions.

## 2024-06-03 NOTE — Telephone Encounter (Signed)
 I left a voicemail for Ms. Due to call the office regarding a new patient referral our office received from Dr.Kandala.   First available is on 12/1 with Dr. Eldonna.

## 2024-06-03 NOTE — Progress Notes (Signed)
 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 Summary of oncologic history is as follows:  Diagnosis: Metastatic high grade serous carcinoma  -04/18/2024: CT [facility]: Soft tissue prominence at the GE junction and gastric fundus. This may be due to underdistention, although gastroesophageal carcinoma cannot be excluded. Mild abdominal retroperitoneal lymphadenopathy.  -05/08/2024: Initial PET: Hypermetabolic abdominal retroperitoneal lymph nodes, worrisome for lymphoma. No abnormal hypermetabolism associated with the gastroesophageal junction or proximal stomach. -05/16/2024: Peripheral Blood Flow Cytometry: No abnormal B or T-cell population.  -05/23/2024: Left retroperitoneal biopsy.  Pathology: Metastatic poorly differentiated carcinoma consistent with high-grade serous carcinoma. Small papillary structures are noted and psammomatous calcifications are present.  Immunohistochemical stains are positive for CK7, PAX8 and mutant p53. Tumor cells are ER positive (75%).    History of Presenting Illness: Discussed the use of AI scribe software for clinical note transcription with the patient, who gave verbal consent to proceed.  History of Present Illness [name] is a 73 year old female who presents to discuss her biopsy results after being evaluated in the rapid diagnostic clinic for retro peritoneal lymphadenopathy.   The results of the biopsy indicated findings more consistent with high grade serous carcinoma likely ovarian cancer. In October, she experienced hematuria and pressure during urination, prompting a hospital visit. Further imaging with a CT scan and a PET scan was conducted.   No vaginal bleeding, abdominal pain, nausea, or vomiting.  She reports decreased appetite and weight loss, attributed to anxiety related to her current medical situation.  Her past medical history includes a hysterectomy performed in the 1980s, with ovaries preserved. She has no history of smoking. Family history is significant for a brother who died of stomach cancer. Other family members have a history of high blood pressure, diabetes, and heart disease.    Medical History: Past Medical History:  Diagnosis Date   Anxiety    Complication of anesthesia    difficult to wake up after anesthesia, itching after spinal   Constipation    Essential hypertension    GERD (gastroesophageal reflux disease)    Heart murmur    Hyperlipidemia    Insomnia    Obesity    Osteoarthritis    Superficial fungus infection of skin 11/25/2015   Type 2 diabetes mellitus (HCC)    patient denies   Urge incontinence 11/04/2014   Vertigo    Vitamin D  deficiency     Surgical history: Past Surgical History:  Procedure Laterality Date   ABDOMINAL HYSTERECTOMY  1989   BIOPSY  12/01/2021   Procedure: BIOPSY;  Surgeon: Shaaron Lamar HERO, MD;  [facility]: [facility];  Service: Endoscopy;;   CARPAL TUNNEL RELEASE Right 06/2016   right middle finger fused; wire in right thumb   COLONOSCOPY  10/20/2002   normal rectum/left-sided diverticula   COLONOSCOPY  07/06/2011   Dr. ShaaronBETHA Mom anal canal hemorrhoids, diminutive rectal and transverse colon polyps. 2 adenoma on path. Left-sided diverticulosis.   COLONOSCOPY N/A 04/21/2016   Surgeon: Lamar HERO Shaaron, MD;  diverticulosis in the sigmoid colon and descending colon, otherwise normal exam, redundant colon.  Recommended repeat in 5 years.   COLONOSCOPY WITH PROPOFOL  N/A 12/01/2021   Procedure: COLONOSCOPY WITH PROPOFOL ;  Surgeon: Shaaron Lamar HERO, MD;  [facility]: [facility];  Service: Endoscopy;  Laterality:  N/A;  1:30pm   JOINT REPLACEMENT     left hip revision Left 12/07/2022   PARTIAL HYSTERECTOMY  1987   SCAR  DEBRIDEMENT OF TOTAL KNEE Left 06/28/2021   Procedure: Left knee open scar debridement versus patella revision;  Surgeon: Ernie Cough, MD;  Location: [facility];  Service: Orthopedics;  Laterality: Left;   TOE SURGERY     second toe on both feet, bunionectomy   TOTAL HIP ARTHROPLASTY  2001/2008   multiple surgeries on right hip including replacements. Two replacements and four dislocations.    TOTAL HIP ARTHROPLASTY  2010   left   TOTAL HIP REVISION  12/25/2011   Procedure: TOTAL HIP REVISION;  Surgeon: Cough JONETTA Ernie, MD;  Location: [facility];  Service: Orthopedics;  Laterality: Right;   TOTAL KNEE ARTHROPLASTY  2008   left   TOTAL KNEE ARTHROPLASTY  09/04/2011   Procedure: TOTAL KNEE ARTHROPLASTY;  Surgeon: Cough JONETTA Ernie, MD;  Location: [facility];  Service: Orthopedics;  Laterality: Right;   TOTAL KNEE REVISION Left 03/22/2021   Procedure: TOTAL KNEE REVISION;  Surgeon: Ernie Cough, MD;  Location: [facility];  Service: Orthopedics;  Laterality: Left;   TOTAL KNEE REVISION Left 12/07/2022   Procedure: TOTAL KNEE REVISION/TIBIAL COMPONENT;  Surgeon: Ernie Cough, MD;  Location: [facility];  Service: Orthopedics;  Laterality: Left;   TOTAL SHOULDER ARTHROPLASTY Left 10/08/2014   Procedure: LEFT TOTAL SHOULDER ARTHROPLASTY;  Surgeon: Franky Pointer, MD;  Location: [facility];  Service: Orthopedics;  Laterality: Left;   TOTAL SHOULDER REPLACEMENT Left 10/08/2014   dr supple     Allergies:  has no known allergies.  Medications:  Current Outpatient Medications  Medication Sig Dispense Refill   ACCU-CHEK GUIDE TEST test strip SMARTSIG:Strip(s)     acetaminophen  (TYLENOL ) 325 MG tablet Take 650 mg by mouth every 6 (six) hours as needed for moderate pain.     ASPIRIN  81 PO      escitalopram (LEXAPRO) 10 MG tablet Take 10 mg by mouth daily.     linaclotide  (LINZESS ) 145 MCG CAPS capsule Take 1 capsule (145 mcg total) by mouth daily before breakfast. 30 capsule 3   lisinopril -hydrochlorothiazide   (PRINZIDE ,ZESTORETIC ) 20-12.5 MG per tablet Take 1 tablet by mouth daily before breakfast.     polyethylene glycol (MIRALAX  / GLYCOLAX ) 17 g packet Take 17 g by mouth 2 (two) times daily. 14 each 0   rosuvastatin (CRESTOR) 10 MG tablet Take 10 mg by mouth daily.     traZODone (DESYREL) 50 MG tablet Take 50 mg by mouth at bedtime.     zolpidem  (AMBIEN ) 10 MG tablet Take 10 mg by mouth at bedtime.     No current facility-administered medications for this visit.   Physical Examination: ECOG PERFORMANCE STATUS: 0 - Asymptomatic  Vitals:   06/03/24 1429 06/03/24 1436  BP: (!) 144/77 126/65  Pulse: (!) 58   Resp: 17   Temp: 98.3 F (36.8 C)   SpO2: 100%    Filed Weights   06/03/24 1429  Weight: 202 lb (91.6 kg)    GENERAL:alert, no distress and comfortable NECK: supple, thyroid  normal size, non-tender, without nodularity LYMPH:  no palpable lymphadenopathy in the cervical, axillary or inguinal LUNGS: clear to auscultation and percussion with normal breathing effort HEART: regular rate & rhythm and no murmurs and no lower extremity edema ABDOMEN:abdomen soft, non-tender and normal bowel sounds Musculoskeletal:no cyanosis of digits and no clubbing  PSYCH: alert & oriented x 3 with fluent speech  Laboratory Data: I  have reviewed the data as listed Lab Results  Component Value Date   WBC 4.6 05/23/2024   HGB 12.9 05/23/2024   HCT 40.5 05/23/2024   MCV 86.4 05/23/2024   PLT 356 05/23/2024   Recent Labs    04/16/24 0632 05/16/24 0910  NA 138 141  K 4.1 3.7  CL 104 106  CO2 20* 23  GLUCOSE 106* 95  BUN 17 14  CREATININE 0.78 0.82  CALCIUM 10.0 9.8  GFRNONAA >60 >60  PROT  --  7.6  ALBUMIN  --  3.8  AST  --  26  ALT  --  18  ALKPHOS  --  74  BILITOT  --  0.4    Radiographic Studies: I have personally reviewed the radiological images as listed and agreed with the findings in the report.  CLINICAL DATA:  Initial treatment strategy for abdominal mass.    EXAM: NUCLEAR MEDICINE PET SKULL BASE TO THIGH   TECHNIQUE: 10.1 mCi F-18 FDG was injected intravenously. Full-ring PET imaging was performed from the skull base to thigh after the radiotracer. CT data was obtained and used for attenuation correction and anatomic localization.   Fasting blood glucose: 121 mg/dl   COMPARISON:  CT abdomen pelvis 04/18/2024.   FINDINGS: Mediastinal blood pool activity: SUV max 2.8   Liver activity: SUV max NA   NECK:   No abnormal hypermetabolism.   Incidental CT findings:   None.   CHEST:   No abnormal hypermetabolism.   Incidental CT findings:   Atherosclerotic calcification of the aorta, aortic valve and coronary arteries. Heart is mildly enlarged. No pericardial or pleural effusion. Calcified granulomas.   ABDOMEN/PELVIS:   Hypermetabolic abdominal retroperitoneal lymph nodes with index left periaortic lymph node measuring 1.6 cm (image 93), SUV max 10.1. No additional abnormal hypermetabolism.   Incidental CT findings:   Hepatic cysts.   SKELETON:   Likely inflammatory soft tissue uptake along the proximal left femur. No osseous hypermetabolism.   Incidental CT findings:   Bilateral hip arthroplasties. Degenerative changes in the spine. Left shoulder arthroplasty.   IMPRESSION: 1. Hypermetabolic abdominal retroperitoneal lymph nodes, worrisome for lymphoma. 2. No abnormal hypermetabolism associated with the gastroesophageal junction or proximal stomach. 3. Aortic atherosclerosis (ICD10-I70.0). Coronary artery calcification.     Electronically Signed   By: Newell Eke M.D.   On: 05/12/2024 11:33    ASSESSMENT & PLAN:  Patient is a 73 y.o. female presenting for metastatic poorly differentiated high grade serous carcinoma.  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 PET scan confirmed lymphadenopathy. High recurrence risk if untreated, but curable with treatment.  - Referred to GYN ONC for surgical evaluation and potential robotic surgery. - Will Order blood test for ovarian cancer marker. - Will send for caris NGS on tissue for future treatment options. - Patient would require post-surgical chemotherapy, typically six cycles. Will discuss with GYN ONC for consideration of pre-opchemotherapy - Genetic evaluation  Return to clinic after evaluation by Alphonza unk for further management.     Orders Placed This Encounter  Procedures   CA 125    Standing Status:   Future    Number of Occurrences:   1    Expected Date:   06/03/2024    Expiration Date:   06/03/2025    Release to patient:   Immediate [1]    Remote health to draw?:  No   Miscellaneous test (send-out)    Standing Status:   Future    Number of Occurrences:   1    Expected Date:   06/03/2024    Expiration Date:   06/03/2025    Test name / description::   Guardant 360    Release to patient:   Immediate   Ambulatory referral to Gynecologic Oncology    Referral Priority:   Routine    Referral Type:   Consultation    Referral Reason:   Specialty Services Required    Number of Visits Requested:   1    The total time spent in the appointment was 40 minutes encounter with patients including review of chart and various tests results, discussions about plan of care and coordination of care plan   All questions were answered. The patient knows to call the clinic with any problems, questions or concerns. No barriers to learning was detected.  Mickiel Dry, MD 11/30/20254:13 PM

## 2024-06-03 NOTE — Patient Instructions (Addendum)
 [facility]  Discharge Instructions  You were seen and examined today by Dr. Davonna. Dr. Davonna is a medical oncologist, meaning that she specializes in the treatment of cancer diagnoses. Dr. Davonna discussed your past medical history, family history of cancers, and the events that led to you being here today.  You were referred to Dr. Davonna due to a new diagnosis of Stage III Ovarian Cancer.  Dr. Davonna has recommended additional labs today as well as a referral to GYN Oncology at [facility].  We will follow-up with you depending upon what the GYN Oncologist recommends.  Thank you for choosing [facility] to provide your oncology and hematology care.   To afford each patient quality time with our provider, please arrive at least 15 minutes before your scheduled appointment time. You may need to reschedule your appointment if you arrive late (10 or more minutes). Arriving late affects you and other patients whose appointments are after yours.  Also, if you miss three or more appointments without notifying the office, you may be dismissed from the clinic at the provider's discretion.    Again, thank you for choosing [facility].  Our hope is that these requests will decrease the amount of time that you wait before being seen by our physicians.   If you have a lab appointment with the Cancer Center - please note that after April 8th, all labs will be drawn in the cancer center.  You do not have to check in or register with the main entrance as you have in the past but will complete your check-in at the cancer center.            _____________________________________________________________  Should you have questions after your visit to [facility], please contact our office at [phone] and follow the prompts.  Our office hours are 8:00 a.m. to 4:30 p.m. Monday - Thursday and 8:00 a.m. to 2:30  p.m. Friday.  Please note that voicemails left after 4:00 p.m. may not be returned until the following business day.  We are closed weekends and all major holidays.  You do have access to a nurse 24-7, just call the main number to the clinic [phone] and do not press any options, hold on the line and a nurse will answer the phone.    For prescription refill requests, have your pharmacy contact our office and allow 72 hours.    Masks are no longer required in the cancer centers. If you would like for your care team to wear a mask while they are taking care of you, please let them know. You may have one support person who is at least 72 years old accompany you for your appointments.

## 2024-06-04 LAB — MISCELLANEOUS TEST

## 2024-06-04 LAB — CA 125: Cancer Antigen (CA) 125: 102 U/mL — ABNORMAL HIGH (ref 0.0–38.1)

## 2024-06-08 DIAGNOSIS — C569 Malignant neoplasm of unspecified ovary: Secondary | ICD-10-CM | POA: Insufficient documentation

## 2024-06-09 ENCOUNTER — Inpatient Hospital Stay: Attending: Oncology | Admitting: Psychiatry

## 2024-06-09 ENCOUNTER — Encounter: Payer: Self-pay | Admitting: Psychiatry

## 2024-06-09 VITALS — BP 138/70 | HR 56 | Temp 98.8°F | Resp 19 | Ht 61.0 in | Wt 207.2 lb

## 2024-06-09 DIAGNOSIS — E669 Obesity, unspecified: Secondary | ICD-10-CM | POA: Insufficient documentation

## 2024-06-09 DIAGNOSIS — Z800 Family history of malignant neoplasm of digestive organs: Secondary | ICD-10-CM | POA: Insufficient documentation

## 2024-06-09 DIAGNOSIS — E119 Type 2 diabetes mellitus without complications: Secondary | ICD-10-CM | POA: Insufficient documentation

## 2024-06-09 DIAGNOSIS — Z6839 Body mass index (BMI) 39.0-39.9, adult: Secondary | ICD-10-CM | POA: Insufficient documentation

## 2024-06-09 DIAGNOSIS — K219 Gastro-esophageal reflux disease without esophagitis: Secondary | ICD-10-CM | POA: Insufficient documentation

## 2024-06-09 DIAGNOSIS — E559 Vitamin D deficiency, unspecified: Secondary | ICD-10-CM | POA: Insufficient documentation

## 2024-06-09 DIAGNOSIS — F419 Anxiety disorder, unspecified: Secondary | ICD-10-CM | POA: Insufficient documentation

## 2024-06-09 DIAGNOSIS — C569 Malignant neoplasm of unspecified ovary: Secondary | ICD-10-CM

## 2024-06-09 DIAGNOSIS — E785 Hyperlipidemia, unspecified: Secondary | ICD-10-CM | POA: Insufficient documentation

## 2024-06-09 DIAGNOSIS — K5909 Other constipation: Secondary | ICD-10-CM | POA: Insufficient documentation

## 2024-06-09 DIAGNOSIS — M1990 Unspecified osteoarthritis, unspecified site: Secondary | ICD-10-CM | POA: Insufficient documentation

## 2024-06-09 DIAGNOSIS — C772 Secondary and unspecified malignant neoplasm of intra-abdominal lymph nodes: Secondary | ICD-10-CM | POA: Insufficient documentation

## 2024-06-09 DIAGNOSIS — R011 Cardiac murmur, unspecified: Secondary | ICD-10-CM | POA: Insufficient documentation

## 2024-06-09 DIAGNOSIS — C579 Malignant neoplasm of female genital organ, unspecified: Secondary | ICD-10-CM | POA: Insufficient documentation

## 2024-06-09 DIAGNOSIS — I10 Essential (primary) hypertension: Secondary | ICD-10-CM | POA: Insufficient documentation

## 2024-06-09 DIAGNOSIS — R6881 Early satiety: Secondary | ICD-10-CM | POA: Insufficient documentation

## 2024-06-09 NOTE — Patient Instructions (Addendum)
 It was a pleasure to see you in clinic today. - We discussed treatment is a combination of chemotherapy and surgery.  Based on the location of your largest lymph node, I recommend we start with chemotherapy first.  We will have you follow back up at [facility] to start chemotherapy. - Recommend repeat imaging after 3 treatments of chemotherapy to reevaluate for surgery after 3 or 4 treatments. - We have also placed a referral for you to see the genetics team. You will receive a call to arrange this appointment. - Return visit planned for after imaging after 3 treatments of chemotherapy  Thank you very much for allowing me to provide care for you today.  I appreciate your confidence in choosing our Gynecologic Oncology team at [facility].  If you have any questions about your visit today please call our office or send us  a MyChart message and we will get back to you as soon as possible.

## 2024-06-09 NOTE — Progress Notes (Signed)
  Rapid Diagnostic Service for Malignancies [facility]  Diagnostic Nurse Navigator Treatment Team Hand-Off Note  06/09/24  Patient Name:  [name] Patient MRN:  [id number] Patient DOB:  1951-04-26   Patient Care Team: Grooms, Charmaine RIGGERS as PCP - General (Physician Assistant) Debera Jayson MATSU, MD as PCP - Cardiology (Cardiology) Shaaron Lamar HERO, MD (Gastroenterology) Delores Dena RAMAN, RN as Oncology Nurse Navigator Davonna Siad, MD as Medical Oncologist (Medical Oncology) Celestia Joesph SQUIBB, RN as Oncology Nurse Navigator (Medical Oncology)  Chief Complaint lymphadenopathy  Oncology History  High grade serous carcinoma [facility])  06/03/2024 Cancer Staging   Staging form: Ovary, AJCC 7th Edition - Clinical stage from 06/03/2024: FIGO Stage III, calculated as Stage IIIC (T3, N1, M0) - Signed by Davonna Siad, MD on 06/08/2024 Stage prefix: Initial diagnosis Source of metastatic specimen: Retroperitoneal Lymph Nodes   06/08/2024 Initial Diagnosis   High grade serous carcinoma (HCC)     Cancer Staging  High grade serous carcinoma (HCC) Staging form: Ovary, AJCC 7th Edition - Clinical stage from 06/03/2024: FIGO Stage III, calculated as Stage IIIC (T3, N1, M0) - Signed by Davonna Siad, MD on 06/08/2024 Stage prefix: Initial diagnosis Source of metastatic specimen: Retroperitoneal Lymph Nodes   SDOH Screening and Interventions Updated:  Yes  SDOH Screenings   Food Insecurity: No Food Insecurity (06/03/2024)  Housing: Unknown (06/03/2024)  Transportation Needs: No Transportation Needs (06/03/2024)  Utilities: Not At Risk (06/03/2024)  Alcohol Screen: Low Risk  (10/17/2022)  Depression (PHQ2-9): Low Risk  (06/03/2024)  Financial Resource Strain: Low Risk  (10/17/2022)  Physical Activity: Inactive (10/26/2022)  Social Connections: Moderately Isolated (10/17/2022)  Stress: No Stress Concern Present (10/17/2022)  Tobacco Use: Low Risk  (04/16/2024)      Genetics Assessment Completed:  No Genetics Referral Made:  yes  Care Team Updated:  Yes

## 2024-06-09 NOTE — Progress Notes (Signed)
 GYNECOLOGIC ONCOLOGY NEW PATIENT CONSULTATION  Date of Service: 06/09/2024 Referring Provider: Davonna Siad, MD [phone] S. [address] [place],  [place] [address]   ASSESSMENT AND PLAN: [name] is a 73 y.o. 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  Reviewed workup to date. Reviewed high grade serous carcinoma diagnosis. Suspect this may be fallopian tube or ovarian in origin although no pelvic mass on imaging.  Reviewed imaging findings. No large volume disease on imaging. However, largest lymph node is a high para-aortic near her left renal vessels.  Reviewed treatment of ovarian/fallopian tube cancer is with a combination of chemo/surgery. Given location of lymph node along with obesity, short torso, reviewed concern that it may not be safely feasible to remove lymph node at this time. As such, recommend starting with neoadjuvant chemotherapy.   Reviewed chemo x3 cycles followed by interval imaging and decision regarding proceeding with interval debulking surgery after 3-4 cycles. In some cases, complete 6 cycles of chemo prior to surgery but do not suspect that this will be necessary in this particular case.  Will have patient return to Dr. Davonna for chemo and follow-up with me after 3 cycles and interval imaging.  Recommend referral to genetics given risk of germline mutation. Pt amenable. Referral placed.   A copy of this note was sent to the patient's referring provider.  Hoy Masters, MD Gynecologic Oncology   Medical Decision Making I personally spent  TOTAL 60 minutes face-to-face and non-face-to-face in the care of this patient, which includes all pre, intra, and post visit time on the date of service.   ------------  CC: high grade serous carcinoma  HISTORY OF PRESENT ILLNESS:  [name] is a 72 y.o. woman who is seen in consultation at the request of Davonna Siad, MD for evaluation of high  grade serous carcinoma.  Patient presented to the emergency room on 04/16/24 for report of blood in her urine.  At that time she also noted some lower abdominal pressure.  Urine culture showed E. coli UTI.  She subsequently was seen by OB/GYN later that day and a CT was ordered due to her suprapubic pain to rule out kidney stone.  CT abdomen/pelvis on 04/18/2024 noted soft tissue prominence in the GE junction and gastric fundus, may be due to underdistention, mild abdominal retroperitoneal lymphadenopathy, limited evaluation of the pelvis due to severe artifact from bilateral hip prostheses.  A PET scan was then ordered and completed on 05/08/2024 showing hypermetabolic abdominal retroperitoneal lymph nodes concerning for possible lymphoma, and no abnormal hypermetabolism associated with the GE junction or proximal stomach.  She was sent to hematology oncology for further evaluation.  Patient was sent for a liquid biopsy which was performed on 05/23/2024 and returned with metastatic poorly differentiated carcinoma consistent with high-grade serous carcinoma.  IHC stains are positive for CK7, PAX8 and mutant p53.  ER was moderately positive in approximately 75%.  Today patient endorses history as above.  She denies pelvic pain.  Does report that she has had chronic constipation for several decades for which she takes Linzess .  Reports that she follows with GI in [place].  Did have some exacerbation of the constipation of the weekend and took mag citrate which helped some.  Also reports early satiety and decreased appetite the past few weeks.  Otherwise denies abdominal bloating, significant weight loss, change in bladder habits.    TREATMENT HISTORY: Oncology History  High grade serous  carcinoma (HCC)  06/03/2024 Cancer Staging   Staging form: Ovary, AJCC 7th Edition - Clinical stage from 06/03/2024: FIGO Stage III, calculated as Stage IIIC (T3, N1, M0) - Signed by Davonna Siad, MD on  06/08/2024 Stage prefix: Initial diagnosis Source of metastatic specimen: Retroperitoneal Lymph Nodes   06/08/2024 Initial Diagnosis   High grade serous carcinoma (HCC)     PAST MEDICAL HISTORY: Past Medical History:  Diagnosis Date   Anxiety    Complication of anesthesia    difficult to wake up after anesthesia, itching after spinal   Constipation    Essential hypertension    GERD (gastroesophageal reflux disease)    Heart murmur    Hyperlipidemia    Insomnia    Obesity    Osteoarthritis    Superficial fungus infection of skin 11/25/2015   Type 2 diabetes mellitus (HCC)    patient denies   Urge incontinence 11/04/2014   Vertigo    Vitamin D  deficiency     PAST SURGICAL HISTORY: Past Surgical History:  Procedure Laterality Date   ABDOMINAL HYSTERECTOMY  1987   fibroids   BIOPSY  12/01/2021   Procedure: BIOPSY;  Surgeon: Shaaron Lamar HERO, MD;  [facility]: [facility];  Service: Endoscopy;;   CARPAL TUNNEL RELEASE Right 06/2016   right middle finger fused; wire in right thumb   COLONOSCOPY  10/20/2002   normal rectum/left-sided diverticula   COLONOSCOPY  07/06/2011   Dr. ShaaronBETHA Mom anal canal hemorrhoids, diminutive rectal and transverse colon polyps. 2 adenoma on path. Left-sided diverticulosis.   COLONOSCOPY N/A 04/21/2016   Surgeon: Lamar HERO Shaaron, MD;  diverticulosis in the sigmoid colon and descending colon, otherwise normal exam, redundant colon.  Recommended repeat in 5 years.   COLONOSCOPY WITH PROPOFOL  N/A 12/01/2021   Procedure: COLONOSCOPY WITH PROPOFOL ;  Surgeon: Shaaron Lamar HERO, MD;  Location: [facility];  Service: Endoscopy;  Laterality: N/A;  1:30pm   JOINT REPLACEMENT     left hip revision Left 12/07/2022   SCAR DEBRIDEMENT OF TOTAL KNEE Left 06/28/2021   Procedure: Left knee open scar debridement versus patella revision;  Surgeon: Ernie Cough, MD;  Location: [facility];  Service: Orthopedics;  Laterality: Left;   TOE SURGERY     second toe on  both feet, bunionectomy   TOTAL HIP ARTHROPLASTY  2001/2008   multiple surgeries on right hip including replacements. Two replacements and four dislocations.    TOTAL HIP ARTHROPLASTY  2010   left   TOTAL HIP REVISION  12/25/2011   Procedure: TOTAL HIP REVISION;  Surgeon: Cough JONETTA Ernie, MD;  Location: [facility];  Service: Orthopedics;  Laterality: Right;   TOTAL KNEE ARTHROPLASTY  2008   left   TOTAL KNEE ARTHROPLASTY  09/04/2011   Procedure: TOTAL KNEE ARTHROPLASTY;  Surgeon: Cough JONETTA Ernie, MD;  Location: [facility];  Service: Orthopedics;  Laterality: Right;   TOTAL KNEE REVISION Left 03/22/2021   Procedure: TOTAL KNEE REVISION;  Surgeon: Ernie Cough, MD;  Location: [facility];  Service: Orthopedics;  Laterality: Left;   TOTAL KNEE REVISION Left 12/07/2022   Procedure: TOTAL KNEE REVISION/TIBIAL COMPONENT;  Surgeon: Ernie Cough, MD;  Location: [facility];  Service: Orthopedics;  Laterality: Left;   TOTAL SHOULDER ARTHROPLASTY Left 10/08/2014   Procedure: LEFT TOTAL SHOULDER ARTHROPLASTY;  Surgeon: Franky Pointer, MD;  Location: [facility];  Service: Orthopedics;  Laterality: Left;   TOTAL SHOULDER REPLACEMENT Left 10/08/2014   dr supple    OB/GYN HISTORY: OB History  Gravida Para Term Preterm  AB Living  3 1 1  2  0  SAB IAB Ectopic Multiple Live Births  2    1    # Outcome Date GA Lbr Len/2nd Weight Sex Type Anes PTL Lv  3 SAB      SAB   FD  2 SAB      SAB   FD  1 Term     F Vag-Spont   DEC      Age at menarche: 54 Age at menopause: hyst at age 65, hotflashes early 51s Hx of HRT: no Hx of STI: no Last pap: 2024 per pt report History of abnormal pap smears: no  SCREENING STUDIES:  Last mammogram: 2025 Last colonoscopy: 2023  MEDICATIONS:  Current Outpatient Medications:    ACCU-CHEK GUIDE TEST test strip, SMARTSIG:Strip(s), Disp: , Rfl:    acetaminophen  (TYLENOL ) 325 MG tablet, Take 650 mg by mouth every 6 (six) hours as needed for moderate pain., Disp: , Rfl:    ASPIRIN  81 PO, ,  Disp: , Rfl:    escitalopram (LEXAPRO) 10 MG tablet, Take 10 mg by mouth daily., Disp: , Rfl:    linaclotide  (LINZESS ) 145 MCG CAPS capsule, Take 1 capsule (145 mcg total) by mouth daily before breakfast., Disp: 30 capsule, Rfl: 3   lisinopril -hydrochlorothiazide  (PRINZIDE ,ZESTORETIC ) 20-12.5 MG per tablet, Take 1 tablet by mouth daily before breakfast., Disp: , Rfl:    polyethylene glycol (MIRALAX  / GLYCOLAX ) 17 g packet, Take 17 g by mouth 2 (two) times daily., Disp: 14 each, Rfl: 0   rosuvastatin (CRESTOR) 10 MG tablet, Take 10 mg by mouth daily., Disp: , Rfl:    traZODone (DESYREL) 50 MG tablet, Take 50 mg by mouth at bedtime., Disp: , Rfl:    zolpidem  (AMBIEN ) 10 MG tablet, Take 10 mg by mouth at bedtime., Disp: , Rfl:   ALLERGIES: No Known Allergies  FAMILY HISTORY: Family History  Problem Relation Age of Onset   Heart failure Mother        Died 22   Diabetes Mother    Hypertension Mother    Early death Father    Pneumonia Father    Alcohol abuse Father    Hypertension Sister    Hypertension Sister    Heart disease Brother    Hypertension Brother    Diabetes Brother    Stomach cancer Brother    Sickle cell trait Brother    GER disease Brother    Thyroid  disease Daughter    Heart failure Daughter    Heart attack Daughter    Miscarriages / Stillbirths Son    Miscarriages / Stillbirths Son    Heart disease Maternal Aunt    Heart disease Maternal Uncle    Colon cancer Neg Hx    Breast cancer Neg Hx    Ovarian cancer Neg Hx    Endometrial cancer Neg Hx     SOCIAL HISTORY: Social History   Socioeconomic History   Marital status: Single    Spouse name: Not on file   Number of children: 1   Years of education: Not on file   Highest education level: Not on file  Occupational History    Employer: [employer]  Tobacco Use   Smoking status: Never   Smokeless tobacco: Never  Vaping Use   Vaping status: Never Used  Substance and Sexual Activity   Alcohol  use: No   Drug use: No   Sexual activity: Not Currently    Birth control/protection: Surgical    Comment:  hyst  Other Topics Concern   Not on file  Social History Narrative   Not on file   Social Drivers of Health   Financial Resource Strain: Low Risk  (10/17/2022)   Overall Financial Resource Strain (CARDIA)    Difficulty of Paying Living Expenses: Not hard at all  Food Insecurity: No Food Insecurity (06/03/2024)   Hunger Vital Sign    Worried About Running Out of Food in the Last Year: Never true    Ran Out of Food in the Last Year: Never true  Transportation Needs: No Transportation Needs (06/03/2024)   PRAPARE - Administrator, Civil Service (Medical): No    Lack of Transportation (Non-Medical): No  Physical Activity: Inactive (10/26/2022)   Exercise Vital Sign    Days of Exercise per Week: 0 days    Minutes of Exercise per Session: 0 min  Stress: No Stress Concern Present (10/17/2022)   [employer] of Occupational Health - Occupational Stress Questionnaire    Feeling of Stress : Only a little  Social Connections: Moderately Isolated (10/17/2022)   Social Connection and Isolation Panel    Frequency of Communication with Friends and Family: More than three times a week    Frequency of Social Gatherings with Friends and Family: Twice a week    Attends Religious Services: More than 4 times per year    Active Member of [employer] or Organizations: No    Attends Banker Meetings: Never    Marital Status: Widowed  Intimate Partner Violence: Not At Risk (06/03/2024)   Humiliation, Afraid, Rape, and Kick questionnaire    Fear of Current or Ex-Partner: No    Emotionally Abused: No    Physically Abused: No    Sexually Abused: No    REVIEW OF SYSTEMS: New patient intake form was reviewed.  Complete 10-system review is negative except for the following: joint pain, incontinence  PHYSICAL EXAM: BP 138/70 (BP Location: Left Arm, Patient Position: Sitting)    Pulse (!) 56   Temp 98.8 F (37.1 C) (Oral)   Resp 19   Ht 5' 1 (1.549 m)   Wt 207 lb 3.2 oz (94 kg)   SpO2 100%   BMI 39.15 kg/m  Constitutional: No acute distress. Neuro/Psych: Alert, oriented.  Head and Neck: Normocephalic, atraumatic. Neck symmetric without masses. Sclera anicteric.  Respiratory: Normal work of breathing. Clear to auscultation bilaterally. Cardiovascular: Regular rate and rhythm, no murmurs, rubs, or gallops. Abdomen: Normoactive bowel sounds. Soft, non-distended, non-tender to palpation. No masses appreciated.  Well-healed infraumbilical midline incision. Extremities: Grossly normal range of motion. Warm, well perfused. No edema bilaterally. Skin: No rashes or lesions. Lymphatic: No cervical, supraclavicular, or inguinal adenopathy. Genitourinary: External genitalia without lesions. Urethral meatus without lesions or prolapse. On speculum exam, vagina without lesions.  Cervix surgically absent.  Bimanual exam reveals smooth vaginal cuff, no pelvic mass or nodularity palpated. Rectovaginal exam confirms the above findings and reveals normal sphincter tone and no masses or nodularity. Exam chaperoned by Eleanor Epps, NP   LABORATORY AND RADIOLOGIC DATA: Outside medical records were reviewed to synthesize the above history, along with the history and physical obtained during the visit.  Outside laboratory, pathology, and imaging reports were reviewed, with pertinent results below.  I personally reviewed the outside images.  WBC  Date Value Ref Range Status  05/23/2024 4.6 4.0 - 10.5 K/uL Final   Hemoglobin  Date Value Ref Range Status  05/23/2024 12.9 12.0 - 15.0 g/dL Final  94/81/7982  13.1 11.1 - 15.9 g/dL Final   HCT  Date Value Ref Range Status  05/23/2024 40.5 36.0 - 46.0 % Final  05/12/2011 42 % Final   Hematocrit  Date Value Ref Range Status  11/25/2015 38.8 34.0 - 46.6 % Final   Platelets  Date Value Ref Range Status  05/23/2024 356 150 - 400  K/uL Final  11/25/2015 284 150 - 379 x10E3/uL Final   LDH  Date Value Ref Range Status  05/16/2024 194 (H) 98 - 192 U/L Final    Comment:    Performed at [facility], [address], [place], [place] [address]   Creat  Date Value Ref Range Status  10/31/2013 0.80 0.50 - 1.10 mg/dL Final   Creatinine, Ser  Date Value Ref Range Status  05/16/2024 0.82 0.44 - 1.00 mg/dL Final   AST  Date Value Ref Range Status  05/16/2024 26 15 - 41 U/L Final  05/12/2011 21 U/L Final   ALT  Date Value Ref Range Status  05/16/2024 18 0 - 44 U/L Final   Cancer Antigen (CA) 125  Date Value Ref Range Status  06/03/2024 102.0 (H) 0.0 - 38.1 U/mL Final    Comment:    (NOTE) Roche Diagnostics Electrochemiluminescence Immunoassay (ECLIA) Values obtained with different assay methods or kits cannot be used interchangeably.  Results cannot be interpreted as absolute evidence of the presence or absence of malignant disease. Performed At: [facility] [address] [place], [place] [id number] Jennette Shorter MD Ey:[phone]    Surgical pathology (05/23/24): A. LYMPH NODE, LEFT RETROPERITONEAL, NEEDLE CORE BIOPSY: -  Metastatic poorly differentiated carcinoma consistent with high-grade serous carcinoma.  Note: The lymph node has nests of poorly differentiated cells sitting predominantly in dilated sinuses.  There is a moderate amount of amphophilic cytoplasm.  The nuclei show open chromatin and small nucleoli.  Small papillary structures are noted and psammomatous calcifications are present.  Immunohistochemical stains are positive for CK7, PAX8 and mutant p53.  ER is moderately positive in approximately 75%.  In addition the following immunohistochemical stains were performed and are negative; TTF-1, p63, GATA3, CK20 and CDX2.  Dr. Frutoso has peer reviewed the case and agrees with the interpretation. The [facility] was informed of the diagnosis on 05/27/2024.   NM PET  Image Initial (PI) Skull Base To Thigh (F-18 FDG) 05/08/2024  Narrative CLINICAL DATA:  Initial treatment strategy for abdominal mass.  EXAM: NUCLEAR MEDICINE PET SKULL BASE TO THIGH  TECHNIQUE: 10.1 mCi F-18 FDG was injected intravenously. Full-ring PET imaging was performed from the skull base to thigh after the radiotracer. CT data was obtained and used for attenuation correction and anatomic localization.  Fasting blood glucose: 121 mg/dl  COMPARISON:  CT abdomen pelvis 04/18/2024.  FINDINGS: Mediastinal blood pool activity: SUV max 2.8  Liver activity: SUV max NA  NECK:  No abnormal hypermetabolism.  Incidental CT findings:  None.  CHEST:  No abnormal hypermetabolism.  Incidental CT findings:  Atherosclerotic calcification of the aorta, aortic valve and coronary arteries. Heart is mildly enlarged. No pericardial or pleural effusion. Calcified granulomas.  ABDOMEN/PELVIS:  Hypermetabolic abdominal retroperitoneal lymph nodes with index left periaortic lymph node measuring 1.6 cm (image 93), SUV max 10.1. No additional abnormal hypermetabolism.  Incidental CT findings:  Hepatic cysts.  SKELETON:  Likely inflammatory soft tissue uptake along the proximal left femur. No osseous hypermetabolism.  Incidental CT findings:  Bilateral hip arthroplasties. Degenerative changes in the spine. Left shoulder arthroplasty.  IMPRESSION: 1.  Hypermetabolic abdominal retroperitoneal lymph nodes, worrisome for lymphoma. 2. No abnormal hypermetabolism associated with the gastroesophageal junction or proximal stomach. 3. Aortic atherosclerosis (ICD10-I70.0). Coronary artery calcification.   Electronically Signed By: Newell Eke M.D. On: 05/12/2024 11:33   CT ABDOMEN PELVIS W CONTRAST 04/18/2024  Narrative CLINICAL DATA:  Abdominal, flank, and suprapubic pain.  EXAM: CT ABDOMEN AND PELVIS WITH CONTRAST  TECHNIQUE: Multidetector CT imaging of the  abdomen and pelvis was performed using the standard protocol following bolus administration of intravenous contrast.  RADIATION DOSE REDUCTION: This exam was performed according to the departmental dose-optimization program which includes automated exposure control, adjustment of the mA and/or kV according to patient size and/or use of iterative reconstruction technique.  CONTRAST:  OMNIPAQUE  IOHEXOL  300 MG/ML  SOLN  COMPARISON:  None Available.  FINDINGS: Lower Chest: No acute findings.  Hepatobiliary: Several benign-appearing hepatic cysts are noted. No suspicious hepatic masses identified. Gallbladder is unremarkable. No evidence of biliary ductal dilatation.  Pancreas:  No mass or inflammatory changes.  Spleen: Within normal limits in size and appearance.  Adrenals/Urinary Tract: No suspicious masses identified. No evidence of ureteral calculi or hydronephrosis. Limited visualization of distal ureters and bladder noted due to severe artifact from bilateral hip prostheses.  Stomach/Bowel: No evidence of bowel obstruction, inflammatory process or abnormal fluid collections. Soft tissue prominence is seen at the GE junction and gastric fundus. This may be due to underdistention although gastroesophageal carcinoma cannot be excluded. Diverticulosis is seen mainly involving the descending and sigmoid colon, however there is no evidence of diverticulitis.  Vascular/Lymphatic: Mild abdominal retroperitoneal lymphadenopathy is seen in the left para-aortic and aortocaval spaces, with largest lymph node measuring 1.6 cm on image 27/2. No pelvic lymphadenopathy identified. No acute vascular findings.  Reproductive: Limited visualization noted due to severe artifact from bilateral hip prostheses. No definite pelvic mass or free fluid identified.  Other:  None.  Musculoskeletal: No suspicious bone lesions identified. Bilateral hip prostheses noted. Severe thoracolumbar  spine degenerative changes are seen.  IMPRESSION: Soft tissue prominence at the GE junction and gastric fundus. This may be due to underdistention, although gastroesophageal carcinoma cannot be excluded. Consider upper endoscopy for further evaluation.  Mild abdominal retroperitoneal lymphadenopathy. Differential diagnosis includes metastatic disease and lymphoproliferative disorder. Recommend correlation for clinical signs of lymphoproliferative disorder, and consider PET-CT or tissue sampling.  Colonic diverticulosis, without radiographic evidence of diverticulitis.  Limited visualization through inferior pelvis due to severe artifact from bilateral hip prostheses.   Electronically Signed By: Norleen DELENA Kil M.D. On: 04/21/2024 15:37

## 2024-06-10 ENCOUNTER — Inpatient Hospital Stay: Attending: Oncology | Admitting: Oncology

## 2024-06-10 VITALS — BP 108/67 | HR 60 | Temp 97.4°F | Resp 18 | Ht 61.5 in | Wt 203.5 lb

## 2024-06-10 DIAGNOSIS — E559 Vitamin D deficiency, unspecified: Secondary | ICD-10-CM | POA: Diagnosis not present

## 2024-06-10 DIAGNOSIS — E669 Obesity, unspecified: Secondary | ICD-10-CM | POA: Diagnosis not present

## 2024-06-10 DIAGNOSIS — R6881 Early satiety: Secondary | ICD-10-CM | POA: Diagnosis not present

## 2024-06-10 DIAGNOSIS — I10 Essential (primary) hypertension: Secondary | ICD-10-CM | POA: Diagnosis not present

## 2024-06-10 DIAGNOSIS — C569 Malignant neoplasm of unspecified ovary: Secondary | ICD-10-CM | POA: Diagnosis not present

## 2024-06-10 DIAGNOSIS — M1990 Unspecified osteoarthritis, unspecified site: Secondary | ICD-10-CM | POA: Diagnosis not present

## 2024-06-10 DIAGNOSIS — C772 Secondary and unspecified malignant neoplasm of intra-abdominal lymph nodes: Secondary | ICD-10-CM | POA: Diagnosis not present

## 2024-06-10 DIAGNOSIS — C579 Malignant neoplasm of female genital organ, unspecified: Secondary | ICD-10-CM | POA: Diagnosis present

## 2024-06-10 DIAGNOSIS — K5909 Other constipation: Secondary | ICD-10-CM | POA: Diagnosis not present

## 2024-06-10 DIAGNOSIS — R011 Cardiac murmur, unspecified: Secondary | ICD-10-CM | POA: Diagnosis not present

## 2024-06-10 DIAGNOSIS — F419 Anxiety disorder, unspecified: Secondary | ICD-10-CM | POA: Diagnosis not present

## 2024-06-10 DIAGNOSIS — Z800 Family history of malignant neoplasm of digestive organs: Secondary | ICD-10-CM | POA: Diagnosis not present

## 2024-06-10 DIAGNOSIS — K5904 Chronic idiopathic constipation: Secondary | ICD-10-CM | POA: Diagnosis not present

## 2024-06-10 DIAGNOSIS — E119 Type 2 diabetes mellitus without complications: Secondary | ICD-10-CM | POA: Diagnosis not present

## 2024-06-10 DIAGNOSIS — E785 Hyperlipidemia, unspecified: Secondary | ICD-10-CM | POA: Diagnosis not present

## 2024-06-10 DIAGNOSIS — Z6839 Body mass index (BMI) 39.0-39.9, adult: Secondary | ICD-10-CM | POA: Diagnosis not present

## 2024-06-10 DIAGNOSIS — K219 Gastro-esophageal reflux disease without esophagitis: Secondary | ICD-10-CM | POA: Diagnosis not present

## 2024-06-10 NOTE — Patient Instructions (Signed)
 [facility] at [facility] Discharge Instructions   You were seen and examined today by Dr. Davonna.  She reviewed the treatment plan with you. Dr. Eldonna would like for you to have 3 rounds of chemo and a scan prior to having surgery. The chemotherapy drugs are carboplatin and Taxol. These are given in the clinic every 3 weeks.    We will arrange for you to have a port a cath placed in order to administer the chemotherapy.   Return as scheduled.    Thank you for choosing [facility] at [facility] to provide your oncology and hematology care.  To afford each patient quality time with our provider, please arrive at least 15 minutes before your scheduled appointment time.   If you have a lab appointment with the Cancer Center please come in thru the Main Entrance and check in at the main information desk.  You need to re-schedule your appointment should you arrive 10 or more minutes late.  We strive to give you quality time with our providers, and arriving late affects you and other patients whose appointments are after yours.  Also, if you no show three or more times for appointments you may be dismissed from the clinic at the providers discretion.     Again, thank you for choosing [facility].  Our hope is that these requests will decrease the amount of time that you wait before being seen by our physicians.       _____________________________________________________________  Should you have questions after your visit to [facility], please contact our office at [phone] and follow the prompts.  Our office hours are 8:00 a.m. and 4:30 p.m. Monday - Friday.  Please note that voicemails left after 4:00 p.m. may not be returned until the following business day.  We are closed weekends and major holidays.  You do have access to a nurse 24-7, just call the main number to the clinic [phone] and do not press any options,  hold on the line and a nurse will answer the phone.    For prescription refill requests, have your pharmacy contact our office and allow 72 hours.    Due to Covid, you will need to wear a mask upon entering the hospital. If you do not have a mask, a mask will be given to you at the Main Entrance upon arrival. For doctor visits, patients may have 1 support person age 45 or older with them. For treatment visits, patients can not have anyone with them due to social distancing guidelines and our immunocompromised population.

## 2024-06-10 NOTE — Progress Notes (Signed)
 Patient Care Team: Debera Jayson MATSU, MD as PCP - Cardiology (Cardiology) Shaaron Lamar HERO, MD (Gastroenterology) Delores Dena RAMAN, RN as Oncology Nurse Navigator Davonna Siad, MD as Medical Oncologist (Medical Oncology) Celestia Joesph SQUIBB, RN as Oncology Nurse Navigator (Medical Oncology)  Clinic Day:  06/10/2024  Referring physician: Grooms, [place], [place]   CHIEF COMPLAINT:  CC: Metastatic high grade serous carcinoma    ASSESSMENT & PLAN:   Assessment & Plan: [name]  is a 73 y.o. 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 PET scan confirmed lymphadenopathy.  Evaluated by Dr. Eldonna with GYN ONC and because of the location of the retroperitoneal lymph node, recommended neoadjuvant chemotherapy. CA125: 102  - Discussed neoadjuvant chemotherapy with carboplatin and paclitaxel.  We discussed some of the risks, benefits, side-effects of carboplatin & Paclitaxel -Some of the short term side-effects included, though not limited to, including weight loss, life threatening infections, risk of allergic reactions, need for transfusions of blood products, nausea, vomiting, change in bowel habits, loss of hair, admission to hospital for various reasons, and risks of death.  -Long term side-effects are also discussed including permanent damage to nerve function, hearing loss, chronic fatigue, kidney damage with possibility needing hemodialysis, and rare secondary malignancy including bone marrow disorders. - The patient is aware that the response rates discussed earlier is not guaranteed.  After a long discussion, patient made an informed decision to proceed with the prescribed plan of care.  -Patient education material was dispensed. Ultimately,the patient agreed to proceed with  plan of care  -Discussed risk versus benefits of port placement.  Side effects to include thrombosis, bleeding and infection risk.  Benefits outweigh risk and will proceed with port placement.  Will place IR referral for the same. -Will repeat a CT scan after 3 cycles of chemotherapy to assess for response. -NGS testing pending at this time. -Will refer for genetic evaluation   Return to clinic prior to second cycle of chemotherapy to assess for tolerance  Chronic constipation  - Continue taking Linnzess as well as miralax      The patient understands the plans discussed today and is in agreement with them.  She knows to contact our office if she develops concerns prior to her next appointment.  22 minutes of total time was spent for this patient encounter, including preparation,face-to-face counseling with the patient and coordination of care, physical exam, and documentation of the encounter.    LILLETTE Verneta SAUNDERS Teague,acting as a neurosurgeon for Siad Davonna, MD.,have documented all relevant documentation on the behalf of Siad Davonna, MD,as directed by  Siad Davonna, MD while in the presence of Siad Davonna, MD.  I, Siad Davonna MD, have reviewed the above documentation for accuracy and completeness, and I agree with the above.    Siad Davonna, MD  [facility] [place] - A DEPT OF [facility] [address] [place] [address] Dept: [phone] Dept Fax: [phone]   Orders Placed This Encounter  Procedures   IR IMAGING GUIDED PORT INSERTION    Standing Status:   Future    Expected Date:   06/17/2024    Expiration Date:   06/10/2025    Reason for Exam (SYMPTOM  OR DIAGNOSIS REQUIRED):   chemotherapy administration    Preferred Imaging Location?:   [facility]     ONCOLOGY HISTORY:   Diagnosis: Metastatic high grade serous carcinoma   -04/18/2024: CT AP: Soft tissue prominence at the GE junction and  gastric fundus. This may be due to underdistention, although gastroesophageal carcinoma cannot be excluded. Mild abdominal retroperitoneal lymphadenopathy.  -05/08/2024: Initial PET: Hypermetabolic abdominal retroperitoneal lymph nodes, worrisome for lymphoma. No abnormal hypermetabolism associated with the gastroesophageal junction or proximal stomach. -05/16/2024: Peripheral Blood Flow Cytometry: No abnormal B or T-cell population.  -05/23/2024: Left retroperitoneal biopsy.  Pathology: Metastatic poorly differentiated carcinoma consistent with high-grade serous carcinoma. Small papillary structures are noted and psammomatous calcifications are present.  Immunohistochemical stains are positive for CK7, PAX8 and mutant p53. Tumor cells are ER positive (75%).  -06/03/2024: CA 125: 102.0 -06/09/2024: Patient evaluated by Gyn onc who recommended 3 cycles of neoadjuvant chemotherapy with interval imaging before determining if debulking surgery is possible  Current Treatment:  Planned carboplatin + paclitaxel.    INTERVAL HISTORY:   [name] is a 73 y.o. female presenting to the clinic today for follow-up of metastatic serous carcinoma. Patient is unaccompanied today.   We discussed treatment plans including neoadjuvant chemotherapy, including its side effects and duration of treatment, as well as future imaging and possible surgery. She has occasional tingling in the hands and feet prior to beginning treatment due to osteoarthritis.   [name] reports chronic constipation and is taking Linnzess as well as miralax  with minimal improvement of symptoms.    She lives at home alone, though her sister lives close by and is available to help her if needed.   I have reviewed the past medical history, past surgical history, social history and family history with the patient and they are unchanged from previous note.  ALLERGIES:  has no known allergies.  MEDICATIONS:  Current Outpatient Medications   Medication Sig Dispense Refill   Accu-Chek FastClix Lancets MISC Apply topically daily.     ACCU-CHEK GUIDE TEST test strip SMARTSIG:Strip(s)     acetaminophen  (TYLENOL ) 325 MG tablet Take 650 mg by mouth every 6 (six) hours as needed for moderate pain.     ASPIRIN  81 PO      escitalopram (LEXAPRO) 10 MG tablet Take 10 mg by mouth daily.     linaclotide  (LINZESS ) 145 MCG CAPS capsule Take 1 capsule (145 mcg total) by mouth daily before breakfast. 30 capsule 3   lisinopril -hydrochlorothiazide  (PRINZIDE ,ZESTORETIC ) 20-12.5 MG per tablet Take 1 tablet by mouth daily before breakfast.     polyethylene glycol (MIRALAX  / GLYCOLAX ) 17 g packet Take 17 g by mouth 2 (two) times daily. 14 each 0   rosuvastatin (CRESTOR) 10 MG tablet Take 10 mg by mouth daily.     traZODone (DESYREL) 50 MG tablet Take 50 mg by mouth at bedtime.     zolpidem  (AMBIEN ) 10 MG tablet Take 10 mg by mouth at bedtime.     No current facility-administered medications for this visit.    VITALS:  Blood pressure 108/67, pulse 60, temperature (!) 97.4 F (36.3 C), temperature source Tympanic, resp. rate 18, height 5' 1.5 (1.562 m), weight 203 lb 8 oz (92.3 kg), SpO2 100%.  Wt Readings from Last 3 Encounters:  06/10/24 203 lb 8 oz (92.3 kg)  06/09/24 207 lb 3.2 oz (94 kg)  06/03/24 202 lb (91.6 kg)    Body mass index is 37.83 kg/m.  Performance status (ECOG): 1 - Symptomatic but completely ambulatory  PHYSICAL EXAM:   GENERAL:alert, no distress and comfortable SKIN: skin  color, texture, turgor are normal, no rashes or significant lesions LUNGS: clear to auscultation and percussion with normal breathing effort HEART: regular rate & rhythm and no murmurs and no lower extremity edema ABDOMEN:abdomen soft, non-tender and normal bowel sounds Musculoskeletal:no cyanosis of digits and no clubbing  NEURO: alert & oriented x 3 with fluent speech  LABORATORY DATA:  I have reviewed the data as listed     Component Value  Date/Time   NA 141 05/16/2024 0910   NA 137 11/25/2015 1429   K 3.7 05/16/2024 0910   K 4.5 05/12/2011 0901   CL 106 05/16/2024 0910   CO2 23 05/16/2024 0910   GLUCOSE 95 05/16/2024 0910   BUN 14 05/16/2024 0910   BUN 15 11/25/2015 1429   CREATININE 0.82 05/16/2024 0910   CREATININE 0.80 10/31/2013 1044   CALCIUM 9.8 05/16/2024 0910   CALCIUM 10.0 05/12/2011 0901   PROT 7.6 05/16/2024 0910   PROT 6.9 11/25/2015 1429   ALBUMIN 3.8 05/16/2024 0910   ALBUMIN 4.1 11/25/2015 1429   AST 26 05/16/2024 0910   AST 21 05/12/2011 0901   ALT 18 05/16/2024 0910   ALKPHOS 74 05/16/2024 0910   ALKPHOS 79 05/12/2011 0901   BILITOT 0.4 05/16/2024 0910   BILITOT 0.6 11/25/2015 1429   BILITOT 0.6 05/12/2011 0901   GFRNONAA >60 05/16/2024 0910   GFRAA 88 11/25/2015 1429     Lab Results  Component Value Date   WBC 4.6 05/23/2024   NEUTROABS 2.7 05/16/2024   HGB 12.9 05/23/2024   HCT 40.5 05/23/2024   MCV 86.4 05/23/2024   PLT 356 05/23/2024    Latest Reference Range & Units 06/03/24 15:00  Cancer Antigen (CA) 125 0.0 - 38.1 U/mL 102.0 (H)  (H): Data is abnormally high  RADIOGRAPHIC STUDIES: I have personally reviewed the radiological images as listed and agreed with the findings in the report

## 2024-06-10 NOTE — Progress Notes (Signed)
 START ON PATHWAY REGIMEN - Ovarian     A cycle is every 21 days:     Paclitaxel      Carboplatin   **Always confirm dose/schedule in your pharmacy ordering system**  Patient Characteristics: High Grade Histologies, Preoperative or Nonsurgical Candidate (Clinical Staging), Newly Diagnosed, Neoadjuvant Therapy followed by Surgery Histology: High Grade Histologies Therapeutic Status: Preoperative or Nonsurgical Candidate (Clinical Staging) BRCA Mutation Status: Awaiting Test Results AJCC T Category: cT3a AJCC N Category: cN1a AJCC M Category: cM0 AJCC 8 Stage Grouping: IIIA2 Therapy Plan: Neoadjuvant Therapy followed by Surgery Intent of Therapy: Curative Intent, Discussed with Patient

## 2024-06-11 ENCOUNTER — Inpatient Hospital Stay: Admitting: Licensed Clinical Social Worker

## 2024-06-11 DIAGNOSIS — C569 Malignant neoplasm of unspecified ovary: Secondary | ICD-10-CM

## 2024-06-11 NOTE — Progress Notes (Signed)
 [facility] Clinical Social Work  Initial Assessment   [name] is a 73 y.o. year old female contacted by phone. Clinical Social Work was referred by medical provider for assessment of psychosocial needs.   SDOH (Social Determinants of Health) assessments performed: Yes SDOH Interventions    Flowsheet Row Care Coordination from 10/26/2022 in [facility] Care Coordination Care Coordination from 09/19/2022 in [facility] Community Care Coordination Care Coordination from 08/21/2022 in [facility] Community Care Coordination Care Coordination from 03/23/2022 in [facility] Care Coordination Telephone from 02/28/2022 in [facility] Care Coordination  SDOH Interventions       Food Insecurity Interventions -- -- -- Intervention Not Indicated --  Housing Interventions -- -- -- -- Intervention Not Indicated  Transportation Interventions Intervention Not Indicated Intervention Not Indicated Intervention Not Indicated -- Intervention Not Indicated  Utilities Interventions -- -- -- Intervention Not Indicated --  Physical Activity Interventions Other (Comments)  [Scheduled for knee surgery 12/07/22. Will be able to increase activity level after that] Other (Comments)  [She does exercise several times a week with a machine but is limited by knee pain but she is active around her home and yard. Knee surgery pending] Intervention Not Indicated, Other (Comments)  [limited by knee pain. working with ortho to schedule surgery] -- --    SDOH Screenings   Food Insecurity: No Food Insecurity (06/03/2024)  Housing: Unknown (06/03/2024)  Transportation Needs: No Transportation Needs (06/03/2024)  Utilities: Not At Risk (06/03/2024)  Alcohol Screen: Low Risk  (10/17/2022)  Depression (PHQ2-9): Low Risk  (06/10/2024)  Financial Resource Strain: Low Risk  (10/17/2022)  Physical Activity: Inactive (10/26/2022)  Social Connections:  Moderately Isolated (10/17/2022)  Stress: No Stress Concern Present (10/17/2022)  Tobacco Use: Low Risk  (06/09/2024)    PHQ 2/9:    06/10/2024    9:59 AM 06/03/2024    3:30 PM 06/03/2024    2:36 PM  Depression screen PHQ 2/9  Decreased Interest 0 0 0  Down, Depressed, Hopeless 0 0 0  PHQ - 2 Score 0 0 0     Distress Screen completed: No     No data to display            Family/Social Information:  Housing Arrangement: patient lives alone Family members/support persons in your life? Pt reports she has 2 sisters, a niece, her sister-in-law and friends who are able to offer support as needed.   Transportation concerns: no  Employment: Retired .  Income source: Actor concerns: Yes, due to illness and/or loss of work during treatment Type of concern: Medical bills Food access concerns: no Religious or spiritual practice: Not known Advanced directives: No, pt informed advanced directives can be completed w/ CSW at the cancer center. Services Currently in place:  none  Coping/ Adjustment to diagnosis: Patient understands treatment plan and what happens next? yes Concerns about diagnosis and/or treatment: Overwhelmed by information, How I will pay for the services I need, How will I care for myself, and Quality of life Patient reported stressors: Finances Hopes and/or priorities: Pt's priority is to start treatment w/ the hope of positive results. Patient enjoys time with family/ friends Current coping skills/ strengths: Capable of independent living , Motivation for treatment/growth , Physical Health , and Supportive family/friends     SUMMARY: Current SDOH Barriers:  Financial constraints related to fixed income  Clinical Social Work Clinical Goal(s):  Explore community resource options for unmet needs  related to:  Financial Strain   Interventions: Discussed common feeling and emotions when being diagnosed with cancer, and the importance of  support during treatment Informed patient of the support team roles and support services at [facility] Provided CSW contact information and encouraged patient to call with any questions or concerns Referred patient to community resources: [employer].  Informed of the [employer].  Pt to apply for SNAP benefits to see if she will meet criteria.  Pt informed of availability to complete Advanced Directives w/ CSW.     Follow Up Plan: Patient will contact CSW with any support or resource needs Patient verbalizes understanding of plan: Yes    Jennifer JONELLE Manna, LCSW Clinical Social Worker [facility]

## 2024-06-12 ENCOUNTER — Other Ambulatory Visit: Payer: Self-pay | Admitting: Student

## 2024-06-12 ENCOUNTER — Inpatient Hospital Stay: Admitting: Licensed Clinical Social Worker

## 2024-06-12 DIAGNOSIS — C569 Malignant neoplasm of unspecified ovary: Secondary | ICD-10-CM

## 2024-06-12 NOTE — Progress Notes (Signed)
 [facility] CSW Progress Note   Interventions: CSW received a call from pt requesting assistance w/ applying for SNAP benefits online.  CSW emailed a link to pt to go directly into the SNAP application.        Follow Up Plan:  Patient will contact CSW with any support or resource needs    Jennifer JONELLE Manna, LCSW Clinical Social Worker [facility]

## 2024-06-13 ENCOUNTER — Other Ambulatory Visit: Payer: Self-pay

## 2024-06-13 ENCOUNTER — Encounter (HOSPITAL_COMMUNITY): Payer: Self-pay

## 2024-06-13 ENCOUNTER — Ambulatory Visit (HOSPITAL_COMMUNITY): Admission: RE | Admit: 2024-06-13 | Discharge: 2024-06-13 | Attending: Oncology

## 2024-06-13 DIAGNOSIS — C569 Malignant neoplasm of unspecified ovary: Secondary | ICD-10-CM

## 2024-06-13 HISTORY — PX: IR IMAGING GUIDED PORT INSERTION: IMG5740

## 2024-06-13 MED ORDER — DEXAMETHASONE 4 MG PO TABS: ORAL_TABLET | ORAL | 1 refills | Status: AC

## 2024-06-13 MED ORDER — SODIUM CHLORIDE 0.9 % IV SOLN: INTRAVENOUS | Status: DC

## 2024-06-13 MED ORDER — MIDAZOLAM HCL (PF) 2 MG/2ML IJ SOLN
INTRAMUSCULAR | Status: AC | PRN
  Administered 2024-06-13 (×2): 1 mg via INTRAVENOUS

## 2024-06-13 MED ORDER — HEPARIN SOD (PORK) LOCK FLUSH 100 UNIT/ML IV SOLN
500.0000 [IU] | Freq: Once | INTRAVENOUS | Status: AC
  Administered 2024-06-13: 500 [IU] via INTRAVENOUS

## 2024-06-13 MED ORDER — LIDOCAINE-EPINEPHRINE 1 %-1:100000 IJ SOLN
20.0000 mL | Freq: Once | INTRAMUSCULAR | Status: AC
  Administered 2024-06-13: 20 mL via INTRADERMAL

## 2024-06-13 MED ORDER — LIDOCAINE-EPINEPHRINE 1 %-1:100000 IJ SOLN
INTRAMUSCULAR | Status: AC
  Filled 2024-06-13: qty 1

## 2024-06-13 MED ORDER — PROCHLORPERAZINE MALEATE 10 MG PO TABS: 10.0000 mg | ORAL_TABLET | Freq: Four times a day (QID) | ORAL | 1 refills | Status: AC | PRN

## 2024-06-13 MED ORDER — FENTANYL CITRATE (PF) 100 MCG/2ML IJ SOLN
INTRAMUSCULAR | Status: AC | PRN
  Administered 2024-06-13 (×2): 50 ug via INTRAVENOUS

## 2024-06-13 MED ORDER — HEPARIN SOD (PORK) LOCK FLUSH 100 UNIT/ML IV SOLN
INTRAVENOUS | Status: AC
  Filled 2024-06-13: qty 5

## 2024-06-13 MED ORDER — ONDANSETRON HCL 8 MG PO TABS: 8.0000 mg | ORAL_TABLET | Freq: Three times a day (TID) | ORAL | 1 refills | Status: AC | PRN

## 2024-06-13 MED ORDER — FENTANYL CITRATE (PF) 100 MCG/2ML IJ SOLN
INTRAMUSCULAR | Status: AC
  Filled 2024-06-13: qty 2

## 2024-06-13 MED ORDER — MIDAZOLAM HCL 2 MG/2ML IJ SOLN
INTRAMUSCULAR | Status: AC
  Filled 2024-06-13: qty 2

## 2024-06-13 MED ORDER — LIDOCAINE-PRILOCAINE 2.5-2.5 % EX CREA: TOPICAL_CREAM | CUTANEOUS | 3 refills | Status: AC

## 2024-06-13 NOTE — Procedures (Signed)
  Procedure:  FLuoro/US  guided R internal jugular port catheter placement   Preprocedure diagnosis: The encounter diagnosis was High grade serous carcinoma (HCC). Postprocedure diagnosis: same EBL:    minimal Complications:   none immediate  See full dictation in [employer].  CHARM Toribio Faes MD Main # [phone] Pager  [phone] Mobile [phone]

## 2024-06-13 NOTE — Patient Instructions (Signed)
 [facility] Chemotherapy Teaching   You have been diagnosed with Ovarian cancer by your oncologist and will receive the treatment Taxol and Carboplatin every three weeks.  The goal of treatment is curative.   You will see the doctor regularly throughout treatment.  We will obtain blood work from you prior to every treatment and monitor your results to make sure it is safe to give your treatment. The doctor monitors your response to treatment by the way you are feeling, your blood work, and by obtaining scans periodically.  There will be wait times while you are here for treatment.  It will take about 30 minutes to 1 hour for your lab work to result.  Then there will be wait times while pharmacy mixes your medications.   Medications you will receive in the clinic prior to your chemotherapy medications:   Aloxi:  ALOXI is used in adults to help prevent the nausea and vomiting that happens with certain chemotherapy drugs.  Aloxi is a long acting medication, and will remain in your system for about 2 days.    Dexamethasone :  This is a steroid given prior to chemotherapy to help prevent allergic reactions; it may also help prevent and control nausea and diarrhea.    Pepcid:  This medication is a histamine blocker that helps prevent and allergic reaction to your chemotherapy.    Benadryl :  This is a histamine blocker (different from the Pepcid) that helps prevent allergic/infusion reactions to your chemotherapy. This medication may cause dizziness/drowsiness.   Emend:  Emend is an anti-nausea medication used together with Aloxi to prevent nausea and vomiting that may be caused by chemotherapy.   Paclitaxel (Taxol)  About This Drug Paclitaxel is a drug used to treat cancer. It is given in the vein (IV).  This will take 1 hour to infuse.  This first infusion will take longer to infuse because it is increased slowly to monitor for reactions.  The nurse will be in the room with you for  the first 15 minutes of the first infusion.  Possible Side Effects   Hair loss. Hair loss is often temporary, although with certain medicine, hair loss can sometimes be permanent. Hair loss may happen suddenly or gradually. If you lose hair, you may lose it from your head, face, armpits, pubic area, chest, and/or legs. You may also notice your hair getting thin.   Swelling of your legs, ankles and/or feet (edema)   Flushing   Nausea and throwing up (vomiting)   Loose bowel movements (diarrhea)   Bone marrow depression. This is a decrease in the number of white blood cells, red blood cells, and platelets. This may raise your risk of infection, make you tired and weak (fatigue), and raise your risk of bleeding.   Effects on the nerves are called peripheral neuropathy. You may feel numbness, tingling, or pain in your hands and feet. It may be hard for you to button your clothes, open jars, or walk as usual. The effect on the nerves may get worse with more doses of the drug. These effects get better in some people after the drug is stopped but it does not get better in all people.   Changes in your liver function   Bone, joint and muscle pain   Abnormal EKG   Allergic reaction: Allergic reactions, including anaphylaxis are rare but may happen in some patients. Signs of allergic reaction to this drug may be swelling of the face, feeling like your tongue  or throat are swelling, trouble breathing, rash, itching, fever, chills, feeling dizzy, and/or feeling that your heart is beating in a fast or not normal way. If this happens, do not take another dose of this drug. You should get urgent medical treatment.   Infection   Changes in your kidney function.  Note: Each of the side effects above was reported in 20% or greater of patients treated with paclitaxel. Not all possible side effects are included above.  Warnings and Precautions   Severe allergic reactions   Severe bone marrow  depression  Treating Side Effects   To help with hair loss, wash with a mild shampoo and avoid washing your hair every day.   Avoid rubbing your scalp, instead, pat your hair or scalp dry   Avoid coloring your hair   Limit your use of hair spray, electric curlers, blow dryers, and curling irons.   If you are interested in getting a wig, talk to your nurse. You can also call the [employer] at 800-ACS-2345 to find out information about the "Look Good, Feel Better" program close to where you live. It is a free program where women getting chemotherapy can learn about wigs, turbans and scarves as well as makeup techniques and skin and nail care.   Ask your doctor or nurse about medicines that are available to help stop or lessen diarrhea and/or nausea.   To help with nausea and vomiting, eat small, frequent meals instead of three large meals a day. Choose foods and drinks that are at room temperature. Ask your nurse or doctor about other helpful tips and medicine that is available to help or stop lessen these symptoms.   If you get diarrhea, eat low-fiber foods that are high in protein and calories and avoid foods that can irritate your digestive tracts or lead to cramping. Ask your nurse or doctor about medicine that can lessen or stop your diarrhea.   Mouth care is very important. Your mouth care should consist of routine, gentle cleaning of your teeth or dentures and rinsing your mouth with a mixture of 1/2 teaspoon of salt in 8 ounces of water  or  teaspoon of baking soda in 8 ounces of water . This should be done at least after each meal and at bedtime.   If you have mouth sores, avoid mouthwash that has alcohol. Also avoid alcohol and smoking because they can bother your mouth and throat.   Drink plenty of fluids (a minimum of eight glasses per day is recommended).   Take your temperature as your doctor or nurse tells you, and whenever you feel like you may have a fever.    Talk to your doctor or nurse about precautions you can take to avoid infections and bleeding.   Be careful when cooking, walking, and handling sharp objects and hot liquids.  Food and Drug Interactions   There are no known interactions of paclitaxel with food.   This drug may interact with other medicines. Tell your doctor and pharmacist about all the medicines and dietary supplements (vitamins, minerals, herbs and others) that you are taking at this time.   The safety and use of dietary supplements and alternative diets are often not known. Using these might affect your cancer or interfere with your treatment. Until more is known, you should not use dietary supplements or alternative diets without your cancer doctor's help.  When to Call the Doctor  Call your doctor or nurse if you have any of the following  symptoms and/or any new or unusual symptoms:   Fever of 100.4 F (38 C) or above   Chills   Redness, pain, warmth, or swelling at the IV site during the infusion   Signs of allergic reaction: swelling of the face, feeling like your tongue or throat are swelling, trouble breathing, rash, itching, fever, chills, feeling dizzy, and/or feeling that your heart is beating in a fast or not normal way   Feeling that your heart is beating in a fast or not normal way (palpitations)   Weight gain of 5 pounds in one week (fluid retention)   Decreased urine or very dark urine   Signs of liver problems: dark urine, pale bowel movements, bad stomach pain, feeling very tired and weak, unusual  itching, or yellowing of the eyes or skin   Heavy menstrual period that lasts longer than normal   Easy bruising or bleeding   Nausea that stops you from eating or drinking, and/or that is not relieved by prescribed medicines.   Loose bowel movements (diarrhea) more than 4 times a day or diarrhea with weakness or lightheadedness   Pain in your mouth or throat that makes it hard to eat or drink    Lasting loss of appetite or rapid weight loss of five pounds in a week   Signs of peripheral neuropathy: numbness, tingling, or decreased feeling in fingers or toes; trouble walking or changes in the way you walk; or feeling clumsy when buttoning clothes, opening jars, or other routine activities   Joint and muscle pain that is not relieved by prescribed medicines   Extreme fatigue that interferes with normal activities   While you are getting this drug, please tell your nurse right away if you have any pain, redness, or swelling at the site of the IV infusion.   If you think you are pregnant.  Reproduction Warnings   Pregnancy warning: This drug may have harmful effects on the unborn child, it is recommended that effective methods of birth control should be used during your cancer treatment. Let your doctor know right away if you think you may be pregnant.   Breast feeding warning: Women should not breast feed during treatment because this drug could enter the breastmilk and cause harm to a breast feeding baby.   Carboplatin (Paraplatin, CBDCA)  About This Drug  Carboplatin is used to treat cancer. It is given in the vein (IV).  It will take 30 minutes to infuse.   Possible Side Effects   Bone marrow suppression. This is a decrease in the number of white blood cells, red blood cells, and platelets. This may raise your risk of infection, make you tired and weak (fatigue), and raise your risk of bleeding.   Nausea and vomiting (throwing up)   Weakness   Changes in your liver function   Changes in your kidney function   Electrolyte changes   Pain  Note: Each of the side effects above was reported in 20% or greater of patients treated with carboplatin. Not all possible side effects are included above.   Warnings and Precautions   Severe bone marrow suppression   Allergic reactions, including anaphylaxis are rare but may happen in some patients. Signs of allergic reaction  to this drug may be swelling of the face, feeling like your tongue or throat are swelling, trouble breathing, rash, itching, fever, chills, feeling dizzy, and/or feeling that your heart is beating in a fast or not normal way. If this happens, do not  take another dose of this drug. You should get urgent medical treatment.   Severe nausea and vomiting   Effects on the nerves are called peripheral neuropathy. This risk is increased if you are over the age of 52 or if you have received other medicine with risk of peripheral neuropathy. You may feel numbness, tingling, or pain in your hands and feet. It may be hard for you to button your clothes, open jars, or walk as usual. The effect on the nerves may get worse with more doses of the drug. These effects get better in some people after the drug is stopped but it does not get better in all people.   Blurred vision, loss of vision or other changes in eyesight   Decreased hearing   - Skin and tissue irritation including redness, pain, warmth, or swelling at the IV site if the drug leaks out of the vein and into nearby tissue.   Severe changes in your kidney function, which can cause kidney failure   Severe changes in your liver function, which can cause liver failure  Note: Some of the side effects above are very rare. If you have concerns and/or questions, please discuss them with your medical team.   Important Information   This drug may be present in the saliva, tears, sweat, urine, stool, vomit, semen, and vaginal secretions. Talk to your doctor and/or your nurse about the necessary precautions to take during this time.   Treating Side Effects   Manage tiredness by pacing your activities for the day.   Be sure to include periods of rest between energy-draining activities.   To decrease the risk of infection, wash your hands regularly.   Avoid close contact with people who have a cold, the flu, or other infections.   Take your  temperature as your doctor or nurse tells you, and whenever you feel like you may have a fever.   To help decrease the risk of bleeding, use a soft toothbrush. Check with your nurse before using dental floss.   Be very careful when using knives or tools.   Use an electric shaver instead of a razor.   Drink plenty of fluids (a minimum of eight glasses per day is recommended).   If you throw up or have loose bowel movements, you should drink more fluids so that you do not become dehydrated (lack of water  in the body from losing too much fluid).   To help with nausea and vomiting, eat small, frequent meals instead of three large meals a day. Choose foods and drinks that are at room temperature. Ask your nurse or doctor about other helpful tips and medicine that is available to help stop or lessen these symptoms.   If you have numbness and tingling in your hands and feet, be careful when cooking, walking, and handling sharp objects and hot liquids.   Keeping your pain under control is important to your well-being. Please tell your doctor or nurse if you are experiencing pain.   Food and Drug Interactions   There are no known interactions of carboplatin with food.   This drug may interact with other medicines. Tell your doctor and pharmacist about all the prescription and over-the-counter medicines and dietary supplements (vitamins, minerals, herbs and others) that you are taking at this time. Also, check with your doctor or pharmacist before starting any new prescription or over-the-counter medicines, or dietary supplements to make sure that there are no interactions.   When to Call the  Doctor  Call your doctor or nurse if you have any of these symptoms and/or any new or unusual symptoms:   Fever of 100.4 F (38 C) or higher   Chills   Tiredness that interferes with your daily activities   Feeling dizzy or lightheaded   Easy bleeding or bruising   Nausea that stops you from  eating or drinking and/or is not relieved by prescribed medicines   Throwing up   Blurred vision or other changes in eyesight   Decrease in hearing or ringing in the ear   Signs of allergic reaction: swelling of the face, feeling like your tongue or throat are swelling, trouble breathing, rash, itching, fever, chills, feeling dizzy, and/or feeling that your heart is beating in a fast or not normal way. If this happens, call 911 for emergency care.   Signs of possible liver problems: dark urine, pale bowel movements, bad stomach pain, feeling very tired and weak, unusual itching, or yellowing of the eyes or skin   Decreased urine, or very dark urine   Numbness, tingling, or pain in your hands and feet   Pain that does not go away or is not relieved by prescribed medicine   While you are getting this drug, please tell your nurse right away if you have any pain, redness, or swelling at the site of the IV infusion, or if you have any new onset of symptoms, or if you just feel different from before when the infusion was started.   Reproduction Warnings   Pregnancy warning: This drug may have harmful effects on the unborn baby. Women of child bearing potential should use effective methods of birth control during your cancer treatment. Let your doctor know right away if you think you may be pregnant.   Breastfeeding warning: It is not known if this drug passes into breast milk. For this reason, women should not breastfeed during treatment because this drug could enter the breast milk and cause harm to a breastfeeding baby.   Fertility warning: Human fertility studies have not been done with this drug. Talk with your doctor or nurse if you plan to have children. Ask for information on sperm or egg banking.   SELF CARE ACTIVITIES WHILE RECEIVING CHEMOTHERAPY:  Hydration Increase your fluid intake 48 hours prior to treatment and drink at least 8 to 12 cups (64 ounces) of  water /decaffeinated beverages per day after treatment. You can still have your cup of coffee or soda but these beverages do not count as part of your 8 to 12 cups that you need to drink daily. No alcohol intake.  Medications Continue taking your normal prescription medication as prescribed.  If you start any new herbal or new supplements please let us  know first to make sure it is safe.  Mouth Care Have teeth cleaned professionally before starting treatment. Keep dentures and partial plates clean. Use soft toothbrush and do not use mouthwashes that contain alcohol. Biotene is a good mouthwash that is available at most pharmacies or may be ordered by calling [phone]. Use warm salt water  gargles (1 teaspoon salt per 1 quart warm water ) before and after meals and at bedtime. If you need dental work, please let the doctor know before you go for your appointment so that we can coordinate the best possible time for you in regards to your chemo regimen. You need to also let your dentist know that you are actively taking chemo. We may need to do labs prior to your  dental appointment.  Skin Care Always use sunscreen that has not expired and with SPF (Sun Protection Factor) of 50 or higher. Wear hats to protect your head from the sun. Remember to use sunscreen on your hands, ears, face, & feet.  Use good moisturizing lotions such as udder cream, eucerin, or even Vaseline. Some chemotherapies can cause dry skin, color changes in your skin and nails.    Avoid long, hot showers or baths. Use gentle, fragrance-free soaps and laundry detergent. Use moisturizers, preferably creams or ointments rather than lotions because the thicker consistency is better at preventing skin dehydration. Apply the cream or ointment within 15 minutes of showering. Reapply moisturizer at night, and moisturize your hands every time after you wash them.  Hair Loss (if your doctor says your hair will fall out)  If your doctor says  that your hair is likely to fall out, decide before you begin chemo whether you want to wear a wig. You may want to shop before treatment to match your hair color. Hats, turbans, and scarves can also camouflage hair loss, although some people prefer to leave their heads uncovered. If you go bare-headed outdoors, be sure to use sunscreen on your scalp. Cut your hair short. It eases the inconvenience of shedding lots of hair, but it also can reduce the emotional impact of watching your hair fall out. Don't perm or color your hair during chemotherapy. Those chemical treatments are already damaging to hair and can enhance hair loss. Once your chemo treatments are done and your hair has grown back, it's OK to resume dyeing or perming hair.  With chemotherapy, hair loss is almost always temporary. But when it grows back, it may be a different color or texture. In older adults who still had hair color before chemotherapy, the new growth may be completely gray.  Often, new hair is very fine and soft.  Infection Prevention Please wash your hands for at least 30 seconds using warm soapy water . Handwashing is the #1 way to prevent the spread of germs. Stay away from sick people or people who are getting over a cold. If you develop respiratory systems such as green/yellow mucus production or productive cough or persistent cough let us  know and we will see if you need an antibiotic. It is a good idea to keep a pair of gloves on when going into grocery stores/[employer] to decrease your risk of coming into contact with germs on the carts, etc. Carry alcohol hand gel with you at all times and use it frequently if out in public. If your temperature reaches 100.4 or higher please call the clinic and let us  know.  If it is after hours or on the weekend please go to the ER if your temperature is over 100.4.  Please have your own personal thermometer at home to use.    Sex and bodily fluids If you are going to have sex, a  condom must be used to protect the person that isn't taking chemotherapy. Chemo can decrease your libido (sex drive). For a few days after chemotherapy, chemotherapy can be excreted through your bodily fluids.  When using the toilet please close the lid and flush the toilet twice.  Do this for a few day after you have had chemotherapy.   Effects of chemotherapy on your sex life Some changes are simple and won't last long. They won't affect your sex life permanently.  Sometimes you may feel: too tired not strong enough to be very active  sick or sore  not in the mood anxious or low  Your anxiety might not seem related to sex. For example, you may be worried about the cancer and how your treatment is going. Or you may be worried about money, or about how you family are coping with your illness.  These things can cause stress, which can affect your interest in sex. It's important to talk to your partner about how you feel.  Remember - the changes to your sex life don't usually last long. There's usually no medical reason to stop having sex during chemo. The drugs won't have any long term physical effects on your performance or enjoyment of sex. Cancer can't be passed on to your partner during sex  Contraception It's important to use reliable contraception during treatment. Avoid getting pregnant while you or your partner are having chemotherapy. This is because the drugs may harm the baby. Sometimes chemotherapy drugs can leave a man or woman infertile.  This means you would not be able to have children in the future. You might want to talk to someone about permanent infertility. It can be very difficult to learn that you may no longer be able to have children. Some people find counselling helpful. There might be ways to preserve your fertility, although this is easier for men than for women. You may want to speak to a fertility expert. You can talk about sperm banking or harvesting your eggs. You can  also ask about other fertility options, such as donor eggs. If you have or have had breast cancer, your doctor might advise you not to take the contraceptive pill. This is because the hormones in it might affect the cancer. It is not known for sure whether or not chemotherapy drugs can be passed on through semen or secretions from the vagina. Because of this some doctors advise people to use a barrier method if you have sex during treatment. This applies to vaginal, anal or oral sex. Generally, doctors advise a barrier method only for the time you are actually having the treatment and for about a week after your treatment. Advice like this can be worrying, but this does not mean that you have to avoid being intimate with your partner. You can still have close contact with your partner and continue to enjoy sex.  Animals If you have cats or birds we just ask that you not change the litter or change the cage.  Please have someone else do this for you while you are on chemotherapy.   Food Safety During and After Cancer Treatment Food safety is important for people both during and after cancer treatment. Cancer and cancer treatments, such as chemotherapy, radiation therapy, and stem cell/bone marrow transplantation, often weaken the immune system. This makes it harder for your body to protect itself from foodborne illness, also called food poisoning. Foodborne illness is caused by eating food that contains harmful bacteria, parasites, or viruses.  Foods to avoid Some foods have a higher risk of becoming tainted with bacteria. These include: Unwashed fresh fruit and vegetables, especially leafy vegetables that can hide dirt and other contaminants Raw sprouts, such as alfalfa sprouts Raw or undercooked beef, especially ground beef, or other raw or undercooked meat and poultry Fatty, fried, or spicy foods immediately before or after treatment.  These can sit heavy on your stomach and make you feel  nauseous. Raw or undercooked shellfish, such as oysters. Sushi and sashimi, which often contain raw fish.  Unpasteurized beverages, such  as unpasteurized fruit juices, raw milk, raw yogurt, or cider Undercooked eggs, such as soft boiled, over easy, and poached; raw, unpasteurized eggs; or foods made with raw egg, such as homemade raw cookie dough and homemade mayonnaise  Simple steps for food safety  Shop smart. Do not buy food stored or displayed in an unclean area. Do not buy bruised or damaged fruits or vegetables. Do not buy cans that have cracks, dents, or bulges. Pick up foods that can spoil at the end of your shopping trip and store them in a cooler on the way home.  Prepare and clean up foods carefully. Rinse all fresh fruits and vegetables under running water , and dry them with a clean towel or paper towel. Clean the top of cans before opening them. After preparing food, wash your hands for 20 seconds with hot water  and soap. Pay special attention to areas between fingers and under nails. Clean your utensils and dishes with hot water  and soap. Disinfect your kitchen and cutting boards using 1 teaspoon of liquid, unscented bleach mixed into 1 quart of water .    Dispose of old food. Eat canned and packaged food before its expiration date (the "use by" or "best before" date). Consume refrigerated leftovers within 3 to 4 days. After that time, throw out the food. Even if the food does not smell or look spoiled, it still may be unsafe. Some bacteria, such as Listeria, can grow even on foods stored in the refrigerator if they are kept for too long.  Take precautions when eating out. At restaurants, avoid buffets and salad bars where food sits out for a long time and comes in contact with many people. Food can become contaminated when someone with a virus, often a norovirus, or another "bug" handles it. Put any leftover food in a "to-go" container yourself, rather than having the server  do it. And, refrigerate leftovers as soon as you get home. Choose restaurants that are clean and that are willing to prepare your food as you order it cooked.   AT HOME MEDICATIONS:                                                                                                                                                                Compazine /Prochlorperazine  10mg  tablet. Take 1 tablet every 6 hours as needed for nausea/vomiting. (This can make you sleepy)   EMLA  cream. Apply a quarter size amount to port site 1 hour prior to chemo. Do not rub in. Cover with plastic wrap.    Diarrhea Sheet   If you are having loose stools/diarrhea, please purchase Imodium and begin taking as outlined:  At the first sign of poorly formed or loose stools you should begin taking Imodium (loperamide) 2 mg capsules.  Take  two tablets (4mg ) followed by one tablet (2mg ) every 2 hours - DO NOT EXCEED 8 tablets in 24 hours.  If it is bedtime and you are having loose stools, take 2 tablets at bedtime, then 2 tablets every 4 hours until morning.   Always call the Cancer Center if you are having loose stools/diarrhea that you can't get under control.  Loose stools/diarrhea leads to dehydration (loss of water ) in your body.  We have other options of trying to get the loose stools/diarrhea to stop but you must let us  know!   Constipation Sheet  Colace - 100 mg capsules - take 2 capsules daily.  If this doesn't help then you can increase to 2 capsules twice daily.  Please call if the above does not work for you. Do not go more than 2 days without a bowel movement.  It is very important that you do not become constipated.  It will make you feel sick to your stomach (nausea) and can cause abdominal pain and vomiting.  Nausea Sheet   Compazine /Prochlorperazine  10mg  tablet. Take 1 tablet every 6 hours as needed for nausea/vomiting (This can make you drowsy).  If you are having persistent nausea (nausea that does not  stop) please call the Cancer Center and let us  know the amount of nausea that you are experiencing.  If you begin to vomit, you need to call the Cancer Center and if it is the weekend and you have vomited more than one time and can't get it to stop-go to the Emergency Room.  Persistent nausea/vomiting can lead to dehydration (loss of fluid in your body) and will make you feel very weak and unwell. Ice chips, sips of clear liquids, foods that are at room temperature, crackers, and toast tend to be better tolerated.   SYMPTOMS TO REPORT AS SOON AS POSSIBLE AFTER TREATMENT:  FEVER GREATER THAN 100.4 F  CHILLS WITH OR WITHOUT FEVER  NAUSEA AND VOMITING THAT IS NOT CONTROLLED WITH YOUR NAUSEA MEDICATION  UNUSUAL SHORTNESS OF BREATH  UNUSUAL BRUISING OR BLEEDING  TENDERNESS IN MOUTH AND THROAT WITH OR WITHOUT PRESENCE OF ULCERS  URINARY PROBLEMS  BOWEL PROBLEMS  UNUSUAL RASH      Wear comfortable clothing and clothing appropriate for easy access to any Portacath or PICC line. Let us  know if there is anything that we can do to make your therapy better!    What to do if you need assistance after hours or on the weekends: CALL [phone].  HOLD on the line, do not hang up.  You will hear multiple messages but at the end you will be connected with a nurse triage line.  They will contact the doctor if necessary.  Most of the time they will be able to assist you.  Do not call the hospital operator.      I have been informed and understand all of the instructions given to me and have received a copy. I have been instructed to call the clinic [phone] or my family physician as soon as possible for continued medical care, if indicated. I do not have any more questions at this time but understand that I may call the Cancer Center or the Patient Navigator at [phone] during office hours should I have questions or need assistance in obtaining follow-up care.

## 2024-06-13 NOTE — H&P (Signed)
 Chief Complaint: Patient was seen in consultation today for port a cath placement- to start chemo next week at the request of Kandala,Hyndavi  Referring Physician(s): Davonna Siad  Supervising Physician: Jenna Hacker  Patient Status: [facility] - Out-pt  History of Present Illness: [name] is a 73 y.o. female   FULL Code status per pt New dx Ovarian cancer follows with Dr VEAR Davonna To start chemo therapy next week  Denies any complaints  Scheduled today for [facility]   Past Medical History:  Diagnosis Date   Anxiety    Complication of anesthesia    difficult to wake up after anesthesia, itching after spinal   Constipation    Essential hypertension    GERD (gastroesophageal reflux disease)    Heart murmur    Hyperlipidemia    Insomnia    Obesity    Osteoarthritis    Superficial fungus infection of skin 11/25/2015   Type 2 diabetes mellitus (HCC)    patient denies   Urge incontinence 11/04/2014   Vertigo    Vitamin D  deficiency     Past Surgical History:  Procedure Laterality Date   ABDOMINAL HYSTERECTOMY  1987   fibroids   BIOPSY  12/01/2021   Procedure: BIOPSY;  Surgeon: Shaaron Lamar HERO, MD;  [facility]: [facility];  Service: Endoscopy;;   CARPAL TUNNEL RELEASE Right 06/2016   right middle finger fused; wire in right thumb   COLONOSCOPY  10/20/2002   normal rectum/left-sided diverticula   COLONOSCOPY  07/06/2011   Dr. ShaaronBETHA Mom anal canal hemorrhoids, diminutive rectal and transverse colon polyps. 2 adenoma on path. Left-sided diverticulosis.   COLONOSCOPY N/A 04/21/2016   Surgeon: Lamar HERO Shaaron, MD;  diverticulosis in the sigmoid colon and descending colon, otherwise normal exam, redundant colon.  Recommended repeat in 5 years.   COLONOSCOPY WITH PROPOFOL  N/A 12/01/2021   Procedure: COLONOSCOPY WITH PROPOFOL ;  Surgeon: Shaaron Lamar HERO, MD;  Location: [facility];  Service: Endoscopy;  Laterality: N/A;  1:30pm   JOINT  REPLACEMENT     left hip revision Left 12/07/2022   SCAR DEBRIDEMENT OF TOTAL KNEE Left 06/28/2021   Procedure: Left knee open scar debridement versus patella revision;  Surgeon: Ernie Cough, MD;  Location: [facility];  Service: Orthopedics;  Laterality: Left;   TOE SURGERY     second toe on both feet, bunionectomy   TOTAL HIP ARTHROPLASTY  2001/2008   multiple surgeries on right hip including replacements. Two replacements and four dislocations.    TOTAL HIP ARTHROPLASTY  2010   left   TOTAL HIP REVISION  12/25/2011   Procedure: TOTAL HIP REVISION;  Surgeon: Cough JONETTA Ernie, MD;  Location: [facility];  Service: Orthopedics;  Laterality: Right;   TOTAL KNEE ARTHROPLASTY  2008   left   TOTAL KNEE ARTHROPLASTY  09/04/2011   Procedure: TOTAL KNEE ARTHROPLASTY;  Surgeon: Cough JONETTA Ernie, MD;  Location: [facility];  Service: Orthopedics;  Laterality: Right;   TOTAL KNEE REVISION Left 03/22/2021   Procedure: TOTAL KNEE REVISION;  Surgeon: Ernie Cough, MD;  Location: [facility];  Service: Orthopedics;  Laterality: Left;   TOTAL KNEE REVISION Left 12/07/2022   Procedure: TOTAL KNEE REVISION/TIBIAL COMPONENT;  Surgeon: Ernie Cough, MD;  Location: [facility];  Service: Orthopedics;  Laterality: Left;   TOTAL SHOULDER ARTHROPLASTY Left 10/08/2014   Procedure: LEFT TOTAL SHOULDER ARTHROPLASTY;  Surgeon: Franky Pointer, MD;  Location: [facility];  Service: Orthopedics;  Laterality: Left;   TOTAL SHOULDER REPLACEMENT Left  10/08/2014   dr supple    Allergies: Patient has no known allergies.  Medications: Prior to Admission medications   Medication Sig Start Date End Date Taking? Authorizing Provider  acetaminophen  (TYLENOL ) 325 MG tablet Take 650 mg by mouth every 6 (six) hours as needed for moderate pain.   Yes [provider]  ASPIRIN  81 PO    Yes [provider]  escitalopram (LEXAPRO) 10 MG tablet Take 10 mg by mouth daily. 02/26/24  Yes [provider]  linaclotide  (LINZESS ) 145 MCG CAPS  capsule Take 1 capsule (145 mcg total) by mouth daily before breakfast. 09/30/21  Yes Rudy, Kristen S, PA-C  lisinopril -hydrochlorothiazide  (PRINZIDE ,ZESTORETIC ) 20-12.5 MG per tablet Take 1 tablet by mouth daily before breakfast.   Yes [provider]  ondansetron  (ZOFRAN ) 8 MG tablet Take 1 tablet (8 mg total) by mouth every 8 (eight) hours as needed for nausea or vomiting. Start on the third day after carboplatin. 06/13/24  Yes Kandala, Hyndavi, MD  rosuvastatin (CRESTOR) 10 MG tablet Take 10 mg by mouth daily. 02/28/24  Yes [provider]  traZODone (DESYREL) 50 MG tablet Take 50 mg by mouth at bedtime. 03/27/24  Yes [provider]  zolpidem  (AMBIEN ) 10 MG tablet Take 10 mg by mouth at bedtime. 11/18/21  Yes [provider]  Accu-Chek FastClix Lancets MISC Apply topically daily. 06/01/24   [provider]  ACCU-CHEK GUIDE TEST test strip SMARTSIG:Strip(s) 05/13/24   [provider]  dexamethasone  (DECADRON ) 4 MG tablet Take 2 tablets (8mg ) by mouth daily starting the day after carboplatin for 3 days. Take with food 06/13/24   Davonna Siad, MD  lidocaine -prilocaine  (EMLA ) cream Apply to affected area once 06/13/24   Kandala, Hyndavi, MD  polyethylene glycol (MIRALAX  / GLYCOLAX ) 17 g packet Take 17 g by mouth 2 (two) times daily. 12/08/22   Patti Rosina SAUNDERS, PA-C  prochlorperazine  (COMPAZINE ) 10 MG tablet Take 1 tablet (10 mg total) by mouth every 6 (six) hours as needed for nausea or vomiting. 06/13/24   Davonna Siad, MD     Family History  Problem Relation Age of Onset   Heart failure Mother        Died 13   Diabetes Mother    Hypertension Mother    Early death Father    Pneumonia Father    Alcohol abuse Father    Hypertension Sister    Hypertension Sister    Heart disease Brother    Hypertension Brother    Diabetes Brother    Stomach cancer Brother    Sickle cell trait Brother    GER disease Brother    Thyroid  disease  Daughter    Heart failure Daughter    Heart attack Daughter    Miscarriages / Stillbirths Son    Miscarriages / Stillbirths Son    Heart disease Maternal Aunt    Heart disease Maternal Uncle    Colon cancer Neg Hx    Breast cancer Neg Hx    Ovarian cancer Neg Hx    Endometrial cancer Neg Hx     Social History   Socioeconomic History   Marital status: Single    Spouse name: Not on file   Number of children: 1   Years of education: Not on file   Highest education level: Not on file  Occupational History    Employer: [employer]  Tobacco Use   Smoking status: Never   Smokeless tobacco: Never  Vaping Use   Vaping status: Never  Used  Substance and Sexual Activity   Alcohol use: No   Drug use: No   Sexual activity: Not Currently    Birth control/protection: Surgical    Comment: hyst  Other Topics Concern   Not on file  Social History Narrative   Not on file   Social Drivers of Health   Financial Resource Strain: Low Risk  (10/17/2022)   Overall Financial Resource Strain (CARDIA)    Difficulty of Paying Living Expenses: Not hard at all  Food Insecurity: No Food Insecurity (06/03/2024)   Hunger Vital Sign    Worried About Running Out of Food in the Last Year: Never true    Ran Out of Food in the Last Year: Never true  Transportation Needs: No Transportation Needs (06/03/2024)   PRAPARE - Administrator, Civil Service (Medical): No    Lack of Transportation (Non-Medical): No  Physical Activity: Inactive (10/26/2022)   Exercise Vital Sign    Days of Exercise per Week: 0 days    Minutes of Exercise per Session: 0 min  Stress: No Stress Concern Present (10/17/2022)   [employer] of Occupational Health - Occupational Stress Questionnaire    Feeling of Stress : Only a little  Social Connections: Moderately Isolated (10/17/2022)   Social Connection and Isolation Panel    Frequency of Communication with Friends and Family: More than three times a week     Frequency of Social Gatherings with Friends and Family: Twice a week    Attends Religious Services: More than 4 times per year    Active Member of [employer] or Organizations: No    Attends Banker Meetings: Never    Marital Status: Widowed    Review of Systems: A 12 point ROS discussed and pertinent positives are indicated in the HPI above.  All other systems are negative.  Review of Systems  Constitutional:  Negative for activity change, fatigue and fever.  Respiratory:  Negative for cough and shortness of breath.   Cardiovascular:  Negative for chest pain.  Gastrointestinal:  Negative for abdominal pain.  Neurological:  Negative for weakness.  Psychiatric/Behavioral:  Negative for behavioral problems and confusion.     Vital Signs: BP 116/75 (BP Location: Right Arm)   Pulse (!) 50   Resp 17   Ht 5' 1.5 (1.562 m)   Wt 203 lb (92.1 kg)   SpO2 99%   BMI 37.74 kg/m   Advance Care Plan: The advanced care plan/surrogate decision maker was discussed at the time of visit and documented in the medical record.    Physical Exam Vitals reviewed.  HENT:     Mouth/Throat:     Mouth: Mucous membranes are moist.  Cardiovascular:     Rate and Rhythm: Normal rate and regular rhythm.     Heart sounds: No murmur heard. Pulmonary:     Effort: Pulmonary effort is normal.     Breath sounds: Normal breath sounds. No wheezing.  Abdominal:     Palpations: Abdomen is soft.  Musculoskeletal:        General: Normal range of motion.  Skin:    General: Skin is warm and dry.  Neurological:     Mental Status: She is alert and oriented to person, place, and time.  Psychiatric:        Behavior: Behavior normal.     Imaging: CT Biopsy Result Date: 05/23/2024 INDICATION: PET positive left retroperitoneal adenopathy, concern for lymphoma EXAM: CT-GUIDED BIOPSY LEFT RETROPERITONEAL ADENOPATHY MEDICATIONS: 1% lidocaine   local ANESTHESIA/SEDATION: 2.0 mg IV Versed ; 100 mcg IV Fentanyl   Moderate Sedation Time:  15 minute The patient was continuously monitored during the procedure by the interventional radiology nurse under my direct supervision. PROCEDURE: The procedure, risks, benefits, and alternatives were explained to the patient. Questions regarding the procedure were encouraged and answered. The patient understands and consents to the procedure. Previous imaging reviewed. Patient positioned prone. Noncontrast localization CT performed. The left retroperitoneal periaortic adenopathy was localized and marked for a posterior paraspinous approach. Under sterile conditions and local anesthesia, the 17 gauge 16.8 cm access needle was advanced to the left retroperitoneal adenopathy. Needle position confirmed with CT. 18 gauge core biopsies obtained through the lymph node. Samples were intact and non fragmented. These were placed in saline. Postprocedure imaging demonstrates no complicating feature. Trace amount of adjacent retroperitoneal hemorrhage from the biopsy. Patient tolerated the procedure well without complication. Vital sign monitoring by nursing staff during the procedure will continue as patient is in the special procedures unit for post procedure observation. FINDINGS: The images document guide needle placement within the left retroperitoneal adenopathy. Post biopsy images demonstrate trace adjacent retroperitoneal hemorrhage. COMPLICATIONS: None immediate. IMPRESSION: Successful CT-guided core biopsy of the left retroperitoneal adenopathy. RADIATION DOSE REDUCTION: This exam was performed according to the departmental dose-optimization program which includes automated exposure control, adjustment of the mA and/or kV according to patient size and/or use of iterative reconstruction technique. Electronically Signed   By: CHRISTELLA.  Shick M.D.   On: 05/23/2024 11:51    Labs:  CBC: Recent Labs    04/16/24 0632 05/16/24 0910 05/23/24 0823  WBC 8.3 5.1 4.6  HGB 13.0 12.7 12.9  HCT 39.6  39.4 40.5  PLT 296 527* 356    COAGS: Recent Labs    05/23/24 0823  INR 0.9    BMP: Recent Labs    04/16/24 0632 05/16/24 0910  NA 138 141  K 4.1 3.7  CL 104 106  CO2 20* 23  GLUCOSE 106* 95  BUN 17 14  CALCIUM 10.0 9.8  CREATININE 0.78 0.82  GFRNONAA >60 >60    LIVER FUNCTION TESTS: Recent Labs    05/16/24 0910  BILITOT 0.4  AST 26  ALT 18  ALKPHOS 74  PROT 7.6  ALBUMIN 3.8    TUMOR MARKERS: No results for input(s): AFPTM, CEA, CA199, CHROMGRNA in the last 8760 hours.  Assessment and Plan:  Scheduled for Pot a cath placement in IR To start chemo next week Risks and benefits of image guided port-a-catheter placement was discussed with the patient including, but not limited to bleeding, infection, pneumothorax, or fibrin sheath development and need for additional procedures.  All of the patient's questions were answered, patient is agreeable to proceed. Consent signed and in chart.  Thank you for this interesting consult.  I greatly enjoyed meeting [name] and look forward to participating in their care.  A copy of this report was sent to the requesting provider on this date.  Electronically Signed: Sharlet DELENA Candle, PA-C 06/13/2024, 1:42 PM   I spent a total of  30 Minutes   in face to face in clinical consultation, greater than 50% of which was counseling/coordinating care for port a cath placement

## 2024-06-16 ENCOUNTER — Inpatient Hospital Stay

## 2024-06-16 DIAGNOSIS — C569 Malignant neoplasm of unspecified ovary: Secondary | ICD-10-CM

## 2024-06-16 DIAGNOSIS — C579 Malignant neoplasm of female genital organ, unspecified: Secondary | ICD-10-CM | POA: Diagnosis not present

## 2024-06-16 LAB — CBC WITH DIFFERENTIAL/PLATELET
Abs Immature Granulocytes: 0.02 K/uL (ref 0.00–0.07)
Basophils Absolute: 0.1 K/uL (ref 0.0–0.1)
Basophils Relative: 1 %
Eosinophils Absolute: 0.2 K/uL (ref 0.0–0.5)
Eosinophils Relative: 4 %
HCT: 39.8 % (ref 36.0–46.0)
Hemoglobin: 12.7 g/dL (ref 12.0–15.0)
Immature Granulocytes: 0 %
Lymphocytes Relative: 26 %
Lymphs Abs: 1.4 K/uL (ref 0.7–4.0)
MCH: 27.7 pg (ref 26.0–34.0)
MCHC: 31.9 g/dL (ref 30.0–36.0)
MCV: 86.9 fL (ref 80.0–100.0)
Monocytes Absolute: 0.6 K/uL (ref 0.1–1.0)
Monocytes Relative: 11 %
Neutro Abs: 3.2 K/uL (ref 1.7–7.7)
Neutrophils Relative %: 58 %
Platelets: 272 K/uL (ref 150–400)
RBC: 4.58 MIL/uL (ref 3.87–5.11)
RDW: 15.5 % (ref 11.5–15.5)
WBC: 5.5 K/uL (ref 4.0–10.5)
nRBC: 0 % (ref 0.0–0.2)

## 2024-06-16 LAB — COMPREHENSIVE METABOLIC PANEL WITH GFR
ALT: 11 U/L (ref 0–44)
AST: 23 U/L (ref 15–41)
Albumin: 4.1 g/dL (ref 3.5–5.0)
Alkaline Phosphatase: 82 U/L (ref 38–126)
Anion gap: 6 (ref 5–15)
BUN: 19 mg/dL (ref 8–23)
CO2: 29 mmol/L (ref 22–32)
Calcium: 9.5 mg/dL (ref 8.9–10.3)
Chloride: 104 mmol/L (ref 98–111)
Creatinine, Ser: 0.83 mg/dL (ref 0.44–1.00)
GFR, Estimated: >60 mL/min (ref >60)
Glucose, Bld: 86 mg/dL (ref 70–99)
Potassium: 4.5 mmol/L (ref 3.5–5.1)
Sodium: 139 mmol/L (ref 135–145)
Total Bilirubin: 0.6 mg/dL (ref 0.0–1.2)
Total Protein: 7.5 g/dL (ref 6.5–8.1)

## 2024-06-16 NOTE — Progress Notes (Signed)

## 2024-06-17 LAB — MAGNESIUM: Magnesium: 2.2 mg/dL (ref 1.7–2.4)

## 2024-06-17 NOTE — Progress Notes (Signed)
 Magnesium bloodwork added and lab notified.

## 2024-06-17 NOTE — Addendum Note (Signed)
 Addended by: LEBRON MILLER B on: 06/17/2024 08:55 AM   Modules accepted: Orders

## 2024-06-18 ENCOUNTER — Inpatient Hospital Stay

## 2024-06-18 DIAGNOSIS — C579 Malignant neoplasm of female genital organ, unspecified: Secondary | ICD-10-CM | POA: Diagnosis not present

## 2024-06-18 DIAGNOSIS — C569 Malignant neoplasm of unspecified ovary: Secondary | ICD-10-CM

## 2024-06-18 MED ORDER — SODIUM CHLORIDE 0.9 % IV SOLN: INTRAVENOUS | Status: DC

## 2024-06-18 MED ORDER — PALONOSETRON HCL INJECTION 0.25 MG/5ML
0.2500 mg | Freq: Once | INTRAVENOUS | Status: AC
  Administered 2024-06-18: 0.25 mg via INTRAVENOUS
  Filled 2024-06-18: qty 5

## 2024-06-18 MED ORDER — DEXAMETHASONE SOD PHOSPHATE PF 10 MG/ML IJ SOLN
10.0000 mg | Freq: Once | INTRAMUSCULAR | Status: AC
  Administered 2024-06-18: 10 mg via INTRAVENOUS

## 2024-06-18 MED ORDER — SODIUM CHLORIDE 0.9 % IV SOLN
560.0000 mg | Freq: Once | INTRAVENOUS | Status: AC
  Administered 2024-06-18: 560 mg via INTRAVENOUS
  Filled 2024-06-18: qty 56

## 2024-06-18 MED ORDER — FAMOTIDINE IN NACL 20-0.9 MG/50ML-% IV SOLN
20.0000 mg | Freq: Once | INTRAVENOUS | Status: AC
  Administered 2024-06-18: 20 mg via INTRAVENOUS
  Filled 2024-06-18: qty 50

## 2024-06-18 MED ORDER — DIPHENHYDRAMINE HCL 50 MG/ML IJ SOLN
50.0000 mg | Freq: Once | INTRAMUSCULAR | Status: AC
  Administered 2024-06-18: 50 mg via INTRAVENOUS
  Filled 2024-06-18: qty 1

## 2024-06-18 MED ORDER — SODIUM CHLORIDE 0.9 % IV SOLN
175.0000 mg/m2 | Freq: Once | INTRAVENOUS | Status: AC
  Administered 2024-06-18: 348 mg via INTRAVENOUS
  Filled 2024-06-18: qty 58

## 2024-06-18 MED ORDER — SODIUM CHLORIDE 0.9 % IV SOLN
150.0000 mg | Freq: Once | INTRAVENOUS | Status: AC
  Administered 2024-06-18: 150 mg via INTRAVENOUS
  Filled 2024-06-18: qty 5

## 2024-06-18 NOTE — Patient Instructions (Signed)
 [facility] - A DEPT OF [facility]  Discharge Instructions: Thank you for choosing [facility] to provide your oncology and hematology care.  If you have a lab appointment with the Cancer Center - please note that after April 8th, 2024, all labs will be drawn in the cancer center.  You do not have to check in or register with the main entrance as you have in the past but will complete your check-in in the cancer center.  Wear comfortable clothing and clothing appropriate for easy access to any Portacath or PICC line.   We strive to give you quality time with your provider. You may need to reschedule your appointment if you arrive late (15 or more minutes).  Arriving late affects you and other patients whose appointments are after yours.  Also, if you miss three or more appointments without notifying the office, you may be dismissed from the clinic at the providers discretion.      For prescription refill requests, have your pharmacy contact our office and allow 72 hours for refills to be completed.    Today you received the following chemotherapy and/or immunotherapy agents C1D1 Paclitaxel /Carboplatin       To help prevent nausea and vomiting after your treatment, we encourage you to take your nausea medication as directed.  BELOW ARE SYMPTOMS THAT SHOULD BE REPORTED IMMEDIATELY: *FEVER GREATER THAN 100.4 F (38 C) OR HIGHER *CHILLS OR SWEATING *NAUSEA AND VOMITING THAT IS NOT CONTROLLED WITH YOUR NAUSEA MEDICATION *UNUSUAL SHORTNESS OF BREATH *UNUSUAL BRUISING OR BLEEDING *URINARY PROBLEMS (pain or burning when urinating, or frequent urination) *BOWEL PROBLEMS (unusual diarrhea, constipation, pain near the anus) TENDERNESS IN MOUTH AND THROAT WITH OR WITHOUT PRESENCE OF ULCERS (sore throat, sores in mouth, or a toothache) UNUSUAL RASH, SWELLING OR PAIN  UNUSUAL VAGINAL DISCHARGE OR ITCHING   Items with * indicate a potential emergency and  should be followed up as soon as possible or go to the Emergency Department if any problems should occur.  Please show the CHEMOTHERAPY ALERT CARD or IMMUNOTHERAPY ALERT CARD at check-in to the Emergency Department and triage nurse.  Should you have questions after your visit or need to cancel or reschedule your appointment, please contact [facility] [place] - A DEPT OF [facility] [phone]  and follow the prompts.  Office hours are 8:00 a.m. to 4:30 p.m. Monday - Friday. Please note that voicemails left after 4:00 p.m. may not be returned until the following business day.  We are closed weekends and major holidays. You have access to a nurse at all times for urgent questions. Please call the main number to the clinic [phone] and follow the prompts.  For any non-urgent questions, you may also contact your provider using MyChart. We now offer e-Visits for anyone 22 and older to request care online for non-urgent symptoms. For details visit [email or web address].   Also download the MyChart app! Go to the app store, search MyChart, open the app, select [place], and log in with your MyChart username and password.

## 2024-06-18 NOTE — Progress Notes (Signed)
 Pharmacist Chemotherapy Monitoring - Initial Assessment    Anticipated start date: 06/18/24   The following has been reviewed per standard work regarding the patient's treatment regimen: The patient's diagnosis, treatment plan and drug doses, and organ/hematologic function Lab orders and baseline tests specific to treatment regimen  The treatment plan start date, drug sequencing, and pre-medications Prior authorization status  Patient's documented medication list, including drug-drug interaction screen and prescriptions for anti-emetics and supportive care specific to the treatment regimen The drug concentrations, fluid compatibility, administration routes, and timing of the medications to be used The patient's access for treatment and lifetime cumulative dose history, if applicable  The patient's medication allergies and previous infusion related reactions, if applicable   Changes made to treatment plan:  N/A  Follow up needed:  N/A   [name], [facility], 06/18/2024  8:23 AM

## 2024-06-18 NOTE — Progress Notes (Signed)
 Confirmed Carboplatin  dose at 560 mg with MD - curative intent  Calculation utilizing actual creatinine 0.82  Dr Ivery Molt, PharmD

## 2024-06-18 NOTE — Progress Notes (Signed)
 Patient presents today for C1D1 Paclitaxel /Carboplatin  chemotherapy infusion.  Patient is in satisfactory condition with no new complaints voiced.  Vital signs are stable.  Labs drawn on 06/16/2024 reviewed and all labs are within treatment parameters.  We will proceed with treatment per MD orders.    Treatment given today per MD orders. Tolerated infusion without adverse affects. Vital signs stable. No complaints at this time. Discharged from clinic via wheelchair in stable condition. Alert and oriented x 3. F/U with [facility] as scheduled.

## 2024-06-19 ENCOUNTER — Other Ambulatory Visit: Payer: Self-pay | Admitting: Licensed Clinical Social Worker

## 2024-06-19 ENCOUNTER — Encounter: Payer: Self-pay | Admitting: Oncology

## 2024-06-19 ENCOUNTER — Encounter: Payer: Self-pay | Admitting: Licensed Clinical Social Worker

## 2024-06-19 ENCOUNTER — Telehealth: Payer: Self-pay

## 2024-06-19 ENCOUNTER — Inpatient Hospital Stay: Admitting: Licensed Clinical Social Worker

## 2024-06-19 DIAGNOSIS — Z800 Family history of malignant neoplasm of digestive organs: Secondary | ICD-10-CM

## 2024-06-19 DIAGNOSIS — C569 Malignant neoplasm of unspecified ovary: Secondary | ICD-10-CM

## 2024-06-19 DIAGNOSIS — Z1379 Encounter for other screening for genetic and chromosomal anomalies: Secondary | ICD-10-CM

## 2024-06-19 NOTE — Telephone Encounter (Signed)
 Chemotherapy 24 hour call.  No complaints voiced by the patient.  Reminded of telephone numbers with understanding verbalized.

## 2024-06-19 NOTE — Telephone Encounter (Signed)
 Chemotherapy 24 hour call.  No complaints voiced.  Reminded of numbers to call with understanding verbalized.

## 2024-06-19 NOTE — Progress Notes (Signed)
 REFERRING PROVIDER: Davonna Siad, MD [phone] [address]. [address] [place],  [place] [address]  PRIMARY PROVIDER:  System, Provider Not In  PRIMARY REASON FOR VISIT:  1. High grade serous carcinoma (HCC)   2. Family history of stomach cancer    I connected with [name] on 06/19/2024 at 11:15 AM EDT by  telephone and verified that I am speaking with the correct person using three identifiers.    Patient location: home Provider location: [facility]    HISTORY OF PRESENT ILLNESS:   [name], a 73 y.o. female, was seen for a  cancer genetics consultation at the request of Dr. Davonna due to a personal and family history of cancer.  [name] presents to clinic today to discuss the possibility of a hereditary predisposition to cancer, genetic testing, and to further clarify her future cancer risks, as well as potential cancer risks for family members.   CANCER HISTORY:  Oncology History  High grade serous carcinoma (HCC)  06/03/2024 Cancer Staging   Staging form: Ovary, AJCC 7th Edition - Clinical stage from 06/03/2024: FIGO Stage III, calculated as Stage IIIC (T3, N1, M0) - Signed by Davonna Siad, MD on 06/08/2024 Stage prefix: Initial diagnosis Source of metastatic specimen: Retroperitoneal Lymph Nodes   06/08/2024 Initial Diagnosis   High grade serous carcinoma (HCC)   06/18/2024 -  Chemotherapy   Patient is on Treatment Plan : OVARIAN Carboplatin  (AUC 6) + Paclitaxel  (175) q21d X 6 Cycles        Past Medical History:  Diagnosis Date   Anxiety    Complication of anesthesia    difficult to wake up after anesthesia, itching after spinal   Constipation    Essential hypertension    GERD (gastroesophageal reflux disease)    Heart murmur    Hyperlipidemia    Insomnia    Obesity    Osteoarthritis    Superficial fungus infection of skin 11/25/2015   Type 2 diabetes mellitus (HCC)    patient denies   Urge incontinence 11/04/2014   Vertigo    Vitamin D  deficiency      Past Surgical History:  Procedure Laterality Date   ABDOMINAL HYSTERECTOMY  1987   fibroids   BIOPSY  12/01/2021   Procedure: BIOPSY;  Surgeon: Shaaron Lamar HERO, MD;  [facility];  Service: Endoscopy;;   CARPAL TUNNEL RELEASE Right 06/2016   right middle finger fused; wire in right thumb   COLONOSCOPY  10/20/2002   normal rectum/left-sided diverticula   COLONOSCOPY  07/06/2011   Dr. ShaaronBETHA Mom anal canal hemorrhoids, diminutive rectal and transverse colon polyps. 2 adenoma on path. Left-sided diverticulosis.   COLONOSCOPY N/A 04/21/2016   Surgeon: Lamar HERO Shaaron, MD;  diverticulosis in the sigmoid colon and descending colon, otherwise normal exam, redundant colon.  Recommended repeat in 5 years.   COLONOSCOPY WITH PROPOFOL  N/A 12/01/2021   Procedure: COLONOSCOPY WITH PROPOFOL ;  Surgeon: Shaaron Lamar HERO, MD;  Location: [facility];  Service: Endoscopy;  Laterality: N/A;  1:30pm   IR IMAGING GUIDED PORT INSERTION  06/13/2024   JOINT REPLACEMENT     left hip revision Left 12/07/2022   SCAR DEBRIDEMENT OF TOTAL KNEE Left 06/28/2021   Procedure: Left knee open scar debridement versus patella revision;  Surgeon: Ernie Cough, MD;  Location: [facility];  Service: Orthopedics;  Laterality: Left;   TOE SURGERY     second toe on both feet, bunionectomy   TOTAL HIP ARTHROPLASTY  2001/2008   multiple surgeries  on right hip including replacements. Two replacements and four dislocations.    TOTAL HIP ARTHROPLASTY  2010   left   TOTAL HIP REVISION  12/25/2011   Procedure: TOTAL HIP REVISION;  Surgeon: Donnice JONETTA Car, MD;  Location: [facility];  Service: Orthopedics;  Laterality: Right;   TOTAL KNEE ARTHROPLASTY  2008   left   TOTAL KNEE ARTHROPLASTY  09/04/2011   Procedure: TOTAL KNEE ARTHROPLASTY;  Surgeon: Donnice JONETTA Car, MD;  Location: [facility];  Service: Orthopedics;  Laterality: Right;   TOTAL KNEE REVISION Left 03/22/2021   Procedure: TOTAL KNEE REVISION;  Surgeon: Car Donnice, MD;  Location: [facility];  Service: Orthopedics;  Laterality: Left;   TOTAL KNEE REVISION Left 12/07/2022   Procedure: TOTAL KNEE REVISION/TIBIAL COMPONENT;  Surgeon: Car Donnice, MD;  Location: [facility];  Service: Orthopedics;  Laterality: Left;   TOTAL SHOULDER ARTHROPLASTY Left 10/08/2014   Procedure: LEFT TOTAL SHOULDER ARTHROPLASTY;  Surgeon: Franky Pointer, MD;  Location: [facility];  Service: Orthopedics;  Laterality: Left;   TOTAL SHOULDER REPLACEMENT Left 10/08/2014   dr supple    FAMILY HISTORY:  We obtained a detailed, 4-generation family history.  Significant diagnoses are listed below: Family History  Problem Relation Age of Onset   Heart failure Mother        Died 47   Diabetes Mother    Hypertension Mother    Early death Father    Pneumonia Father    Alcohol abuse Father    Hypertension Sister    Hypertension Sister    Heart disease Brother    Hypertension Brother    Diabetes Brother    Stomach cancer Brother    Sickle cell trait Brother    GER disease Brother    Heart disease Maternal Aunt    Heart disease Maternal Uncle    Thyroid  disease Daughter    Heart failure Daughter    Heart attack Daughter    Miscarriages / Stillbirths Son    Miscarriages / Stillbirths Son    Cancer Nephew        two nephews w/ cancer; brain and possibly back/stomach?   Colon cancer Neg Hx    Breast cancer Neg Hx    Ovarian cancer Neg Hx    Endometrial cancer Neg Hx    [name] had 1 daughter who passed at 32 of heart issues/thyroid  disease. She has 2 sisters and 2 brothers. One brother died of stomach cancer at 20. Two nephews have had cancer, one possibly had brain cancer and the other has back/stomach cancer.   No other known cancers in the family. Limited information about paternal side.   [name] is unaware of previous family history of genetic testing for hereditary cancer risks. There is no reported Ashkenazi Jewish ancestry. There is no known consanguinity.    GENETIC  COUNSELING ASSESSMENT: [name] is a 73 y.o. female with a personal history of ovarian cancer  which is somewhat suggestive of a hereditary cancer syndrome and predisposition to cancer. We, therefore, discussed and recommended the following at today's visit.   DISCUSSION: We discussed that approximately 20% of ovarian cancer is hereditary. Most cases of hereditary ovarian cancer are associated with BRCA1/BRCA2 genes, although there are other genes associated with hereditary cancer as well. Cancers and risks are gene specific. We discussed that testing is beneficial for several reasons including knowing about cancer risks, identifying potential screening and risk-reduction options that may be appropriate, and to understand if other family members could  be at risk for cancer and allow them to undergo genetic testing.   We reviewed the characteristics, features and inheritance patterns of hereditary cancer syndromes. We also discussed genetic testing, including the appropriate family members to test, the process of testing, insurance coverage and turn-around-time for results. We discussed the implications of a negative, positive and/or variant of uncertain significant result. We recommended [name] pursue genetic testing for the [facility] Common Hereditary Cancers+RNA gene panel.   The Common Hereditary Cancers Panel + RNA offered by [facility] includes sequencing and/or deletion duplication testing of the following 48 genes: APC*, ATM*, AXIN2, BAP1, BARD1, BMPR1A, BRCA1, BRCA2, BRIP1, CDH1, CDK4, CDKN2A (p14ARF), CDKN2A (p16INK4a), CHEK2, CTNNA1, DICER1*, EPCAM*, FH*, GREM1*, HOXB13, KIT, MBD4, MEN1*, MLH1*, MSH2*, MSH3*, MSH6*, MUTYH, NF1*, NTHL1, PALB2, PDGFRA, PMS2*, POLD1*, POLE, PTEN*, RAD51C, RAD51D, SDHA*, SDHB, SDHC*, SDHD, SMAD4, SMARCA4, STK11, TP53, TSC1*, TSC2, VHL.   Based on [name] personal history of cancer, she meets medical criteria for genetic testing. Despite that she meets criteria, she may  still have an out of pocket cost.   PLAN: After considering the risks, benefits, and limitations, [name] provided informed consent to pursue genetic testing and the blood sample was sent to [facility] for analysis of the Common Hereditary Cancers+RNA Panel. Results should be available within approximately 2-3 weeks' time, at which point they will be disclosed by telephone to [name], as will any additional recommendations warranted by these results. [name] will receive a summary of her genetic counseling visit and a copy of her results once available. This information will also be available in Epic.   [name] questions were answered to her satisfaction today. Our contact information was provided should additional questions or concerns arise. Thank you for the referral and allowing us  to share in the care of your patient.   Dena Cary, MS, [facility] Genetic Counselor [email or web address] Phone: [phone]  I personally spent a total of 40 minutes in the care of the patient today including counseling and educating, placing orders, and documenting clinical information in the EHR. Dr. Delinda was available for discussion regarding this case.   _______________________________________________________________________ For Office Staff:  Number of people involved in session: 1 Was an Intern/ student involved with case: no

## 2024-06-20 ENCOUNTER — Inpatient Hospital Stay

## 2024-06-20 VITALS — BP 120/67 | HR 51 | Temp 97.6°F | Resp 18

## 2024-06-20 DIAGNOSIS — Z1379 Encounter for other screening for genetic and chromosomal anomalies: Secondary | ICD-10-CM

## 2024-06-20 DIAGNOSIS — C569 Malignant neoplasm of unspecified ovary: Secondary | ICD-10-CM

## 2024-06-20 DIAGNOSIS — C579 Malignant neoplasm of female genital organ, unspecified: Secondary | ICD-10-CM | POA: Diagnosis not present

## 2024-06-20 LAB — GENETIC SCREENING ORDER

## 2024-06-20 LAB — MAGNESIUM: Magnesium: 2 mg/dL (ref 1.7–2.4)

## 2024-06-20 MED ORDER — PEGFILGRASTIM-CBQV 6 MG/0.6ML ~~LOC~~ SOSY
6.0000 mg | PREFILLED_SYRINGE | Freq: Once | SUBCUTANEOUS | Status: AC
  Administered 2024-06-20: 6 mg via SUBCUTANEOUS
  Filled 2024-06-20: qty 0.6

## 2024-06-20 NOTE — Patient Instructions (Signed)
 [facility] - A DEPT OF [facility]  Discharge Instructions: Thank you for choosing [facility] to provide your oncology and hematology care.  If you have a lab appointment with the Cancer Center - please note that after April 8th, 2024, all labs will be drawn in the cancer center.  You do not have to check in or register with the main entrance as you have in the past but will complete your check-in in the cancer center.  Wear comfortable clothing and clothing appropriate for easy access to any Portacath or PICC line.   We strive to give you quality time with your provider. You may need to reschedule your appointment if you arrive late (15 or more minutes).  Arriving late affects you and other patients whose appointments are after yours.  Also, if you miss three or more appointments without notifying the office, you may be dismissed from the clinic at the provider's discretion.      For prescription refill requests, have your pharmacy contact our office and allow 72 hours for refills to be completed.    Today you received the following injection: Udenyca   To help prevent nausea and vomiting after your treatment, we encourage you to take your nausea medication as directed.  BELOW ARE SYMPTOMS THAT SHOULD BE REPORTED IMMEDIATELY: *FEVER GREATER THAN 100.4 F (38 C) OR HIGHER *CHILLS OR SWEATING *NAUSEA AND VOMITING THAT IS NOT CONTROLLED WITH YOUR NAUSEA MEDICATION *UNUSUAL SHORTNESS OF BREATH *UNUSUAL BRUISING OR BLEEDING *URINARY PROBLEMS (pain or burning when urinating, or frequent urination) *BOWEL PROBLEMS (unusual diarrhea, constipation, pain near the anus) TENDERNESS IN MOUTH AND THROAT WITH OR WITHOUT PRESENCE OF ULCERS (sore throat, sores in mouth, or a toothache) UNUSUAL RASH, SWELLING OR PAIN  UNUSUAL VAGINAL DISCHARGE OR ITCHING   Items with * indicate a potential emergency and should be followed up as soon as possible or go to the  Emergency Department if any problems should occur.  Please show the CHEMOTHERAPY ALERT CARD or IMMUNOTHERAPY ALERT CARD at check-in to the Emergency Department and triage nurse.  Should you have questions after your visit or need to cancel or reschedule your appointment, please contact [facility] [place] - A DEPT OF [facility] [phone]  and follow the prompts.  Office hours are 8:00 a.m. to 4:30 p.m. Monday - Friday. Please note that voicemails left after 4:00 p.m. may not be returned until the following business day.  We are closed weekends and major holidays. You have access to a nurse at all times for urgent questions. Please call the main number to the clinic [phone] and follow the prompts.  For any non-urgent questions, you may also contact your provider using MyChart. We now offer e-Visits for anyone 65 and older to request care online for non-urgent symptoms. For details visit [email or web address].   Also download the MyChart app! Go to the app store, search "MyChart", open the app, select [place], and log in with your MyChart username and password.

## 2024-06-20 NOTE — Progress Notes (Signed)
Udenyca injection given per orders.Patient tolerated it well without problems. Vitals stable and discharged home from clinic ambulatory. Follow up as scheduled.

## 2024-06-30 ENCOUNTER — Encounter: Payer: Self-pay | Admitting: Licensed Clinical Social Worker

## 2024-06-30 ENCOUNTER — Ambulatory Visit: Payer: Self-pay | Admitting: Licensed Clinical Social Worker

## 2024-06-30 ENCOUNTER — Inpatient Hospital Stay: Admitting: Dietician

## 2024-06-30 ENCOUNTER — Telehealth: Payer: Self-pay | Admitting: Dietician

## 2024-06-30 ENCOUNTER — Telehealth: Payer: Self-pay | Admitting: Licensed Clinical Social Worker

## 2024-06-30 DIAGNOSIS — Z15068 Genetic susceptibility to other malignant neoplasm of digestive system: Secondary | ICD-10-CM | POA: Insufficient documentation

## 2024-06-30 DIAGNOSIS — Z1379 Encounter for other screening for genetic and chromosomal anomalies: Secondary | ICD-10-CM | POA: Insufficient documentation

## 2024-06-30 NOTE — Progress Notes (Signed)
 Genetic Test Results - BRCA2+  HPI:   [name] was previously seen in the [facility] due to a personal and family history of cancer and concerns regarding a hereditary predisposition to cancer. Please refer to our prior cancer genetics clinic note for more information regarding our discussion, assessment and recommendations, at the time. Ms. Lemberger recent genetic test results were disclosed to her, as were recommendations warranted by these results. These results and recommendations are discussed in more detail below.  CANCER HISTORY:  Oncology History  High grade serous carcinoma (HCC)  06/03/2024 Cancer Staging   Staging form: Ovary, AJCC 7th Edition - Clinical stage from 06/03/2024: FIGO Stage III, calculated as Stage IIIC (T3, N1, M0) - Signed by Davonna Siad, MD on 06/08/2024 Stage prefix: Initial diagnosis Source of metastatic specimen: Retroperitoneal Lymph Nodes   06/08/2024 Initial Diagnosis   High grade serous carcinoma (HCC)   06/18/2024 -  Chemotherapy   Patient is on Treatment Plan : OVARIAN Carboplatin  (AUC 6) + Paclitaxel  (175) q21d X 6 Cycles     06/30/2024 Genetic Testing   Pathogenic variant in BRCA2 called [id number] (p.Lys437Ilefs*22) identified on the [facility] Common Hereditary Cancers+RNA Panel. The report date is 05/31/2024.  The Common Hereditary Cancers Panel + RNA offered by [facility] includes sequencing and/or deletion duplication testing of the following 48 genes: APC*, ATM*, AXIN2, BAP1, BARD1, BMPR1A, BRCA1, BRCA2, BRIP1, CDH1, CDK4, CDKN2A (p14ARF), CDKN2A (p16INK4a), CHEK2, CTNNA1, DICER1*, EPCAM*, FH*, GREM1*, HOXB13, KIT, MBD4, MEN1*, MLH1*, MSH2*, MSH3*, MSH6*, MUTYH, NF1*, NTHL1, PALB2, PDGFRA, PMS2*, POLD1*, POLE, PTEN*, RAD51C, RAD51D, SDHA*, SDHB, SDHC*, SDHD, SMAD4, SMARCA4, STK11, TP53, TSC1*, TSC2, VHL.      FAMILY HISTORY:  We obtained a detailed, 4-generation family history.  Significant diagnoses are listed  below: Family History  Problem Relation Age of Onset   Heart failure Mother        Died 37   Diabetes Mother    Hypertension Mother    Early death Father    Pneumonia Father    Alcohol abuse Father    Hypertension Sister    Hypertension Sister    Heart disease Brother    Hypertension Brother    Diabetes Brother    Stomach cancer Brother    Sickle cell trait Brother    GER disease Brother    Heart disease Maternal Aunt    Heart disease Maternal Uncle    Thyroid  disease Daughter    Heart failure Daughter    Heart attack Daughter    Miscarriages / Stillbirths Son    Miscarriages / Stillbirths Son    Cancer Nephew        two nephews w/ cancer; brain and possibly back/stomach?   Colon cancer Neg Hx    Breast cancer Neg Hx    Ovarian cancer Neg Hx    Endometrial cancer Neg Hx    Ms. Comes had 1 daughter who passed at 55 of heart issues/thyroid  disease. She has 2 sisters and 2 brothers. One brother died of stomach cancer at 31. Two nephews have had cancer, one possibly had brain cancer and the other has back/stomach cancer.   No other known cancers in the family. Limited information about paternal side.    Ms. Woodbury is unaware of previous family history of genetic testing for hereditary cancer risks. There is no reported Ashkenazi Jewish ancestry. There is no known consanguinity.        GENETIC TEST RESULTS:  Ms. Skeen tested positive for a single  pathogenic variant (harmful genetic change) in the BRCA2 gene. Specifically, this variant is [id number] (p.Lys437Ilefs*22).  The Common Hereditary Cancers Panel + RNA offered by [facility] includes sequencing and/or deletion duplication testing of the following 48 genes: APC*, ATM*, AXIN2, BAP1, BARD1, BMPR1A, BRCA1, BRCA2, BRIP1, CDH1, CDK4, CDKN2A (p14ARF), CDKN2A (p16INK4a), CHEK2, CTNNA1, DICER1*, EPCAM*, FH*, GREM1*, HOXB13, KIT, MBD4, MEN1*, MLH1*, MSH2*, MSH3*, MSH6*, MUTYH, NF1*, NTHL1, PALB2, PDGFRA, PMS2*, POLD1*, POLE, PTEN*,  RAD51C, RAD51D, SDHA*, SDHB, SDHC*, SDHD, SMAD4, SMARCA4, STK11, TP53, TSC1*, TSC2, VHL.   The test report has been scanned into EPIC and is located under the Molecular Pathology section of the Results Review tab.  A portion of the result report is included below for reference. Genetic testing reported out on 06/30/2024  Clinical Information: Hereditary breast and ovarian cancer (HBOC) syndrome is characterized by an increased lifetime risk for, generally, adult-onset cancers including, breast, contralateral breast, female breast, ovarian, prostate, melanoma and pancreatic.  The cancers associated with BRCA2 are: Female breast cancer, 55-69% risk In women with a history of breast cancer, the risk for contralateral breast cancer 20 years after breast cancer diagnosis is 25%.  Female breast cancer, up to an 7% risk Ovarian cancer, 13-29% risk Pancreatic cancer, 5-10% risk Prostate cancer, 19-61% risk Melanoma, elevated risk   Management Recommendations:  Breast Screening/Risk Reduction:  Women: Breast awareness  Clinical breast examination every 6-12 months starting at age 66  Breast cancer screening: Age 70-29 years, annual breast MRI with and without contrast (or mammogram, if MRI is unavailable), although the age to initiate screening may be individualized based on family history Age 40-75 years, annual mammogram and breast MRI with and without contrast Age >75 years, management should be considered on an individual basis For women with a BRCA2 pathogenic or likely pathogenic variant who are treated for breast cancer and have not had a bilateral mastectomy, screening with annual mammogram and breast MRI should continue as described above.  Males: Breast self-exam training and education starting at age 74 years Annual clinical breast exam starting at age 66 years  Consider annual mammogram starting at age 10 or 10 years before the earliest known female breast cancer in the family (whichever  comes first).   Gynecological Cancer Screening/Risk Reduction: It is recommended that women with a BRCA2 mutation have a risk-reducing salpingo oophorectomy (RRSO), removal of the ovaries and fallopian tubes. It is reasonable to delay RRSO until age 47-45 years unless age at diagnosis in the family warrants earlier age for consideration of RRSO.  Having a RRSO is estimated to reduce the risk of ovarian cancer by up to 96%. There is still a small risk of developing an ovarian-like cancer in the lining of the abdomen, called the peritoneum. Salpingectomy (removal of the fallopian tubes) reduces the risk of ovarian cancer and is an option for premenopausal patients who are not yet ready for oophorectomy (removal of the ovaries). Limited data suggests there may be a slightly increased risk for serous uterine cancer. Therefore, there should be a discussion regarding the risks and benefits of concurrent hysterectomy at the time of RRSO. Ovarian cancer screening is an option for women who chose not to have a RRSO or who have not yet completed their family. Current screening methods for ovarian cancer are neither sensitive nor specific, meaning that often early stage ovarian cancer cannot be diagnosed through this screening.  Screening can also be falsely positive with no cancer present. For this reason, RRSO is recommended over screening. If ovarian  cancer screening is recommended by a physician, it could include: CA-125 blood tests Transvaginal ultrasounds Clinical pelvic exams   Skin Cancer Screening and Risk Reduction: Regular skin self-examinations Individuals should notify their physicians of any changes to moles such as increasing in size, darkening in color, or other change in appearance. Annual skin examinations by a dermatologist  Follow sun-safety recommendations such as: Using UVA and UVB 30 SPF or higher sunscreen Avoiding sunburns Limiting sun exposure, especially during the hours of  11am-4pm  Wearing protective clothing and sunglasses Avoid using tanning beds For more information about the prevention of melanoma visit [email or web address]   Prostate Cancer Screening: Annual digital rectal exam (DRE) at age 71 Annual PSA blood test at age 2 Screening can also include baseline mpMRI by age 34 or at an age dependent on prostate cancer family history  Pancreatic Cancer Screening/Risk Reduction: Pancreatic cancer screening may be considered starting at age 33 (or 40 years younger than the earliest exocrine pancreatic cancer diagnosis in the family, whichever is earlier). Screening includes annual endoscopic ultrasound and/or MRI of the pancreas.  Reproductive Considerations: Patients of reproductive age should be made aware of options for prenatal diagnosis and assisted reproduction including pre-implantation genetic diagnosis. Individuals with a single pathogenic BRCA2 variant are carriers of Fanconi anemia. Fanconi anemia is characterized by developmental delay apparent from infancy, short stature, microcephaly, and coarse dysmorphic features. For there to be a risk of Fanconi anemia in offspring, both the patient and their partner would each have to carry a pathogenic variant in BRCA2. In this case, the risk of having an affected child is 25%.   Additional Considerations: Individuals at risk for developing breast and ovarian cancer may benefit from the use of medication to reduce their risk for cancer. These medications are referred to as chemoprevention. For example, oral contraceptive use has been shown to reduce the risk of ovarian cancer by approximately 60% in BRCA2 mutation carriers if taken for at least 5 years. This risk reduction remains even after discontinuation of oral contraceptives. Recent studies have suggested PARP inhibitors may be a beneficial chemotherapeutic agent for a subset of patients with BRCA2-associated breast, ovarian, prostate, and pancreatic  cancers. Clinical trials are currently in process to determine if and how these agents can be useful in the treatment of BRCA2 cancer patients. In premenopausal individuals, oophorectomy likely reduces the risk of developing breast cancer but the magnitude is uncertain and may be gene-specific.  This information is based on current understanding of the gene and may change in the future.  Implications for Family Members: Hereditary predisposition to cancer due to pathogenic variants in the BRCA2 gene has autosomal dominant inheritance. This means that an individual with a pathogenic variant has a 50% chance of passing the condition on to his/her offspring. Identification of a pathogenic variant allows for the recognition of at-risk relatives who can pursue testing for the familial variant.  Family members are encouraged to consider genetic testing for this familial pathogenic variant. As there are generally no childhood cancer risks associated with a single pathogenic variant in the BRCA2 gene, individuals in the family are not recommended to have testing until they reach at least 72 years of age. They may contact our office at [phone] for more information or to schedule an appointment. Complimentary testing for the familial variant is available for 90 days after the genetic testing report date.  Family members who live outside of the area are encouraged to find a dentist in their area  by visiting: [email or web address].[email or web address].  Resources: FORCE (Facing Our Risk of Cancer Empowered) is a resource for those with a hereditary predisposition to develop cancer.  FORCE provides information about risk reduction, advocacy, legislation, and clinical trials.  Additionally, FORCE provides a platform for collaboration and support; which includes: peer navigation, message boards, local support groups, a toll-free helpline, research registry and recruitment, advocate training, published  medical research, webinars, brochures, mastectomy photos, and more.  For more information, visit www.facingourrisk.org    PLAN:   1. These results will be made available to  her oncologist, Dr. Davonna. She would like this provider to follow her long-term for this indication and coordinate any appropriate screening.   2. Ms. Hohler plans to discuss these results with her family and will reach out to us  if we can be of any assistance in coordinating genetic testing for any of h  We encouraged Ms. Waight to remain in contact with us  on an annual basis so we can update her personal and family histories, and let her know of advances in cancer genetics that may benefit the family. Our contact number was provided. Ms. Blacksher questions were answered to her satisfaction today, and she knows she is welcome to call anytime with additional questions.   Dena Cary, MS, [facility] Genetic Counselor [email or web address] Phone: [phone]

## 2024-06-30 NOTE — Telephone Encounter (Signed)
 I contacted Ms. Norrington to discuss her genetic testing results. Pathogenic variant in BRCA2 called [id number] (p.Lys437Ilefs*[id number])  identified. Detailed clinic note to follow.   The test report has been scanned into EPIC and is located under the Molecular Pathology section of the Results Review tab.  A portion of the result report is included below for reference.      Dena Cary, MS, [facility] Genetic Counselor [email or web address] Phone: [phone]

## 2024-06-30 NOTE — Telephone Encounter (Signed)
 Nutrition Assessment   Reason for Assessment: new    ASSESSMENT: 73 year old with stage III ovarian cancer with lymphadenopathy. She is receiving neoadjuvant chemotherapy with carboplatin  + paclitaxel . Patient is under the care of Dr. Davonna.   Past medical history includes inflammatory arthritis, DDD, urinary urgency, spinal stenosis  Spoke with patient via telephone. Patient  did well with first treatment other than fatigue. Reports significant pain in every joint in her body after booster shot. This lasted for 2 days. Patient reports not a big eater at baseline. Appetite is up and down. Eats 1-2 meals and snacks on fruit. States her sister is staying on her to eat more. Patient asking if Fairlife shakes are okay. Patient is drinking at least 64 ounces of water . Chronic constipation continues. Takes linzess  and colace. Patient uses mg citrate if bowel regimen has not helped after a few days.    Nutrition Focused Physical Exam: deferred - telephone visit   Medications: decadron , linzess , zofran , miralax , compazine , crestor, trazodone, ambien     Labs: reviewed   Anthropometrics:   Height: 5' 1.5 Weight: 203 lb  UBW: 215 lb (April 2024) BMI: 37.74   NUTRITION DIAGNOSIS: Food and nutrition related knowledge deficit related to cancer as evidenced by no prior need for associated nutrition information   INTERVENTION:  Discussed importance of adequate calorie/protein energy intake to minimize loss of LBM Encourage small frequent meals/snacks vs 2 larger meals Educated on sources of protein, recommend protein foods at every meal Continue fairlife shake Bowel regimen per MD   MONITORING, EVALUATION, GOAL: wt trends, intake   Next Visit: Monday January 12 via telephone

## 2024-07-07 ENCOUNTER — Encounter: Payer: Self-pay | Admitting: *Deleted

## 2024-07-08 ENCOUNTER — Other Ambulatory Visit: Payer: Self-pay | Admitting: Oncology

## 2024-07-08 DIAGNOSIS — C569 Malignant neoplasm of unspecified ovary: Secondary | ICD-10-CM

## 2024-07-11 ENCOUNTER — Other Ambulatory Visit: Payer: Self-pay

## 2024-07-15 ENCOUNTER — Inpatient Hospital Stay: Attending: Oncology

## 2024-07-15 ENCOUNTER — Inpatient Hospital Stay

## 2024-07-15 ENCOUNTER — Inpatient Hospital Stay (HOSPITAL_BASED_OUTPATIENT_CLINIC_OR_DEPARTMENT_OTHER): Admitting: Oncology

## 2024-07-15 VITALS — BP 121/63 | HR 63 | Temp 97.6°F | Resp 17

## 2024-07-15 VITALS — BP 108/76 | HR 81 | Temp 97.8°F | Resp 16 | Wt 205.7 lb

## 2024-07-15 DIAGNOSIS — Z5189 Encounter for other specified aftercare: Secondary | ICD-10-CM | POA: Insufficient documentation

## 2024-07-15 DIAGNOSIS — Z1509 Genetic susceptibility to other malignant neoplasm: Secondary | ICD-10-CM | POA: Diagnosis not present

## 2024-07-15 DIAGNOSIS — M898X9 Other specified disorders of bone, unspecified site: Secondary | ICD-10-CM

## 2024-07-15 DIAGNOSIS — Z452 Encounter for adjustment and management of vascular access device: Secondary | ICD-10-CM | POA: Insufficient documentation

## 2024-07-15 DIAGNOSIS — K5904 Chronic idiopathic constipation: Secondary | ICD-10-CM

## 2024-07-15 DIAGNOSIS — Z1589 Genetic susceptibility to other disease: Secondary | ICD-10-CM

## 2024-07-15 DIAGNOSIS — Z1379 Encounter for other screening for genetic and chromosomal anomalies: Secondary | ICD-10-CM

## 2024-07-15 DIAGNOSIS — C569 Malignant neoplasm of unspecified ovary: Secondary | ICD-10-CM

## 2024-07-15 DIAGNOSIS — Z15068 Genetic susceptibility to other malignant neoplasm of digestive system: Secondary | ICD-10-CM

## 2024-07-15 DIAGNOSIS — Z1501 Genetic susceptibility to malignant neoplasm of breast: Secondary | ICD-10-CM | POA: Diagnosis not present

## 2024-07-15 DIAGNOSIS — Z1507 Genetic susceptibility to malignant neoplasm of urinary tract: Secondary | ICD-10-CM

## 2024-07-15 DIAGNOSIS — Z5111 Encounter for antineoplastic chemotherapy: Secondary | ICD-10-CM | POA: Diagnosis present

## 2024-07-15 LAB — COMPREHENSIVE METABOLIC PANEL WITH GFR
ALT: 12 U/L (ref 0–44)
AST: 23 U/L (ref 15–41)
Albumin: 4.1 g/dL (ref 3.5–5.0)
Alkaline Phosphatase: 83 U/L (ref 38–126)
Anion gap: 8 (ref 5–15)
BUN: 19 mg/dL (ref 8–23)
CO2: 27 mmol/L (ref 22–32)
Calcium: 9.4 mg/dL (ref 8.9–10.3)
Chloride: 103 mmol/L (ref 98–111)
Creatinine, Ser: 0.7 mg/dL (ref 0.44–1.00)
GFR, Estimated: >60 mL/min
Glucose, Bld: 92 mg/dL (ref 70–99)
Potassium: 3.9 mmol/L (ref 3.5–5.1)
Sodium: 139 mmol/L (ref 135–145)
Total Bilirubin: 0.4 mg/dL (ref 0.0–1.2)
Total Protein: 7.1 g/dL (ref 6.5–8.1)

## 2024-07-15 LAB — CBC WITH DIFFERENTIAL/PLATELET
Abs Immature Granulocytes: 0.01 K/uL (ref 0.00–0.07)
Basophils Absolute: 0.1 K/uL (ref 0.0–0.1)
Basophils Relative: 1 %
Eosinophils Absolute: 0.3 K/uL (ref 0.0–0.5)
Eosinophils Relative: 7 %
HCT: 37.3 % (ref 36.0–46.0)
Hemoglobin: 11.9 g/dL — ABNORMAL LOW (ref 12.0–15.0)
Immature Granulocytes: 0 %
Lymphocytes Relative: 43 %
Lymphs Abs: 1.8 K/uL (ref 0.7–4.0)
MCH: 27 pg (ref 26.0–34.0)
MCHC: 31.9 g/dL (ref 30.0–36.0)
MCV: 84.8 fL (ref 80.0–100.0)
Monocytes Absolute: 0.6 K/uL (ref 0.1–1.0)
Monocytes Relative: 13 %
Neutro Abs: 1.5 K/uL — ABNORMAL LOW (ref 1.7–7.7)
Neutrophils Relative %: 36 %
Platelets: 310 K/uL (ref 150–400)
RBC: 4.4 MIL/uL (ref 3.87–5.11)
RDW: 17.5 % — ABNORMAL HIGH (ref 11.5–15.5)
WBC: 4.2 K/uL (ref 4.0–10.5)
nRBC: 0 % (ref 0.0–0.2)

## 2024-07-15 LAB — MAGNESIUM: Magnesium: 1.9 mg/dL (ref 1.7–2.4)

## 2024-07-15 MED ORDER — SODIUM CHLORIDE 0.9 % IV SOLN
560.0000 mg | Freq: Once | INTRAVENOUS | Status: AC
  Administered 2024-07-15: 560 mg via INTRAVENOUS
  Filled 2024-07-15: qty 56

## 2024-07-15 MED ORDER — FAMOTIDINE IN NACL 20-0.9 MG/50ML-% IV SOLN
20.0000 mg | Freq: Once | INTRAVENOUS | Status: AC
  Administered 2024-07-15: 20 mg via INTRAVENOUS
  Filled 2024-07-15: qty 50

## 2024-07-15 MED ORDER — DEXAMETHASONE SOD PHOSPHATE PF 10 MG/ML IJ SOLN
10.0000 mg | Freq: Once | INTRAMUSCULAR | Status: AC
  Administered 2024-07-15: 10 mg via INTRAVENOUS

## 2024-07-15 MED ORDER — SODIUM CHLORIDE 0.9 % IV SOLN
150.0000 mg | Freq: Once | INTRAVENOUS | Status: AC
  Administered 2024-07-15: 150 mg via INTRAVENOUS
  Filled 2024-07-15: qty 150

## 2024-07-15 MED ORDER — SODIUM CHLORIDE 0.9 % IV SOLN: INTRAVENOUS | Status: DC

## 2024-07-15 MED ORDER — SODIUM CHLORIDE 0.9 % IV SOLN
175.0000 mg/m2 | Freq: Once | INTRAVENOUS | Status: AC
  Administered 2024-07-15: 348 mg via INTRAVENOUS
  Filled 2024-07-15: qty 58

## 2024-07-15 MED ORDER — DIPHENHYDRAMINE HCL 50 MG/ML IJ SOLN
50.0000 mg | Freq: Once | INTRAMUSCULAR | Status: AC
  Administered 2024-07-15: 50 mg via INTRAVENOUS
  Filled 2024-07-15: qty 1

## 2024-07-15 MED ORDER — PALONOSETRON HCL INJECTION 0.25 MG/5ML
0.2500 mg | Freq: Once | INTRAVENOUS | Status: AC
  Administered 2024-07-15: 0.25 mg via INTRAVENOUS
  Filled 2024-07-15: qty 5

## 2024-07-15 NOTE — Progress Notes (Signed)
 Patient has been examined by Dr. Davonna. Vital signs and labs have been reviewed by MD - ANC, Creatinine, LFTs, hemoglobin, and platelets have been reviewed by M.D. - pt may proceed with treatment.  Primary RN and pharmacy notified.

## 2024-07-15 NOTE — Progress Notes (Signed)
 " Patient Care Team: System, Provider Not In as PCP - General Debera Jayson MATSU, MD as PCP - Cardiology (Cardiology) Shaaron Lamar HERO, MD (Gastroenterology) Delores Dena RAMAN, RN as Oncology Nurse Navigator Davonna Siad, MD as Medical Oncologist (Medical Oncology) Celestia Joesph SQUIBB, RN as Oncology Nurse Navigator (Medical Oncology)  Clinic Day:  07/15/2024  Referring physician: Davonna Siad, MD   CHIEF COMPLAINT:  CC: Metastatic high grade serous carcinoma    ASSESSMENT & PLAN:   Assessment & Plan: [name]  is a 74 y.o. 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 PET scan confirmed lymphadenopathy.  Evaluated by Dr. Eldonna with GYN ONC and because of the location of the retroperitoneal lymph node, recommended neoadjuvant chemotherapy. CA125: 102 Currently on neoadjuvant chemotherapy  - C2D1 of carboplatin  and paclitaxel . Tolerated first cycle well with no significant side effects. - Will obtain a PET scan after completion of 3 cycles to assess for response. - Patient has BRCA2 mutation.  Will consider PARP inhibitor after surgery. -Labs reviewed today: CMP: WNL, CBC: Hemoglobin: 11.9, normal WBC and platelets.  Will obtain CA125 with every cycle. - Physical exam stable today.  Will proceed with chemotherapy today. -Recommended to continue taking Zofran  for nausea and consider steroids if needed. - Genetic counseling done. - Follow-up with Dr. Eldonna on 08/18/2024.  Return to clinic 6 weeks discuss results and further management  BRCA2 mutation Patient has pathogenic BRCA2 mutation on germline mutation testing and also on Guardant360. Genetic counseling done.  Patient has no family history of pancreatic, ovarian, breast cancer.  - Patient's last mammogram was  from 02/2024 and was negative.  Bone pain from growth factors Advised patient to take Claritin the day of G-CSF and continue it for a week until the symptoms resolve.  Chronic constipation  - Continue taking Linnzess as well as miralax      The patient understands the plans discussed today and is in agreement with them.  She knows to contact our office if she develops concerns prior to her next appointment.  35 minutes of total time was spent for this patient encounter, including preparation,face-to-face counseling with the patient and coordination of care, physical exam, and documentation of the encounter.    [name],acting as a neurosurgeon for Siad Davonna, MD.,have documented all relevant documentation on the behalf of Siad Davonna, MD,as directed by  Siad Davonna, MD while in the presence of Siad Davonna, MD.  I, Siad Davonna MD, have reviewed the above documentation for accuracy and completeness, and I agree with the above.    Siad Davonna, MD  [facility] [place] - A DEPT OF [facility] [address] [place] [address] Dept: [phone] Dept Fax: [phone]   Orders Placed This Encounter  Procedures   NM PET Image Restag (PS) Skull Base To Thigh    Standing Status:   Future    Expected Date:   08/15/2024    Expiration Date:   07/15/2025    If indicated for the ordered procedure, I authorize the administration of a radiopharmaceutical per Radiology protocol:   Yes    Preferred imaging location?:   [facility]   Genetic Screening Order    Standing Status:   Future    Expected Date:   07/15/2024  Expiration Date:   07/15/2025     ONCOLOGY HISTORY:   Diagnosis: Metastatic high grade serous carcinoma   -04/18/2024: CT AP: Soft tissue prominence at the GE junction and gastric fundus. This may be due to underdistention, although gastroesophageal carcinoma cannot be excluded. Mild abdominal  retroperitoneal lymphadenopathy.  -05/08/2024: Initial PET: Hypermetabolic abdominal retroperitoneal lymph nodes, worrisome for lymphoma. No abnormal hypermetabolism associated with the gastroesophageal junction or proximal stomach. -05/16/2024: Peripheral Blood Flow Cytometry: No abnormal B or T-cell population.  -05/23/2024: Left retroperitoneal biopsy.  Pathology: Metastatic poorly differentiated carcinoma consistent with high-grade serous carcinoma. Small papillary structures are noted and psammomatous calcifications are present.  Immunohistochemical stains are positive for CK7, PAX8 and mutant p53. Tumor cells are ER positive (75%).  -06/03/2024: CA 125: 102.0 -06/09/2024: Patient evaluated by Gyn onc who recommended 3 cycles of neoadjuvant chemotherapy with interval imaging before determining if debulking surgery is possible -06/13/2024: Port placed. -06/20/2024: Guardant 360: BRCA2 positive. TP53. TMB: 8.3mut/Mb -06/20/2024: [employer] panel: BRCA2 pathogenic variant present. Genetic counseling done.  -06/18/2024- Current: Neoadjuvant Carboplatin  and Paclitaxel .  Current Treatment:  Carboplatin  + paclitaxel .    INTERVAL HISTORY:   [name] is a 74 y.o. female presenting to the clinic today for follow-up of metastatic serous carcinoma. Patient is unaccompanied today.   She tolerated her first cycle of chemotherapy well and denies any nausea or vomiting. She notes she did not take her steroids for 2-3 days after treatment. Her constipation is improved with linzess  and colace.   [name] reports pain for 2 days after her Udenyca  injection, though she did take Claritin the day of the injection.   We discussed mutation testing results, which was positive for BRCA2, as well as increased family likelihood of certain cancers due to the hereditary mutation.  Family history of cancer includes a brother and nephew with stomach cancer. She has no family oncology history of breast caner in males or  ovarian cancer in females.   I have reviewed the past medical history, past surgical history, social history and family history with the patient and they are unchanged from previous note.  ALLERGIES:  has no known allergies.  MEDICATIONS:  Current Outpatient Medications  Medication Sig Dispense Refill   Accu-Chek FastClix Lancets MISC Apply topically daily.     ACCU-CHEK GUIDE TEST test strip SMARTSIG:Strip(s)     acetaminophen  (TYLENOL ) 325 MG tablet Take 650 mg by mouth every 6 (six) hours as needed for moderate pain.     dexamethasone  (DECADRON ) 4 MG tablet Take 2 tablets (8mg ) by mouth daily starting the day after carboplatin  for 3 days. Take with food 30 tablet 1   escitalopram (LEXAPRO) 10 MG tablet Take 10 mg by mouth daily.     linaclotide  (LINZESS ) 145 MCG CAPS capsule Take 1 capsule (145 mcg total) by mouth daily before breakfast. 30 capsule 3   lisinopril -hydrochlorothiazide  (PRINZIDE ,ZESTORETIC ) 20-12.5 MG per tablet Take 1 tablet by mouth daily before breakfast.     ondansetron  (ZOFRAN ) 8 MG tablet Take 1 tablet (8 mg total) by mouth every 8 (eight) hours as needed for nausea or vomiting. Start on the third day after carboplatin . 30 tablet 1   polyethylene glycol (MIRALAX  / GLYCOLAX ) 17 g packet Take 17 g by mouth 2 (two) times daily. 14 each 0   prochlorperazine  (COMPAZINE ) 10 MG tablet Take 1 tablet (10 mg total) by mouth every 6 (six) hours as needed for nausea or vomiting. 30 tablet 1   rosuvastatin (CRESTOR) 10 MG tablet  Take 10 mg by mouth daily.     traZODone (DESYREL) 50 MG tablet Take 50 mg by mouth at bedtime.     zolpidem  (AMBIEN ) 10 MG tablet Take 10 mg by mouth at bedtime.     ASPIRIN  81 PO  (Patient not taking: Reported on 07/15/2024)     lidocaine -prilocaine  (EMLA ) cream Apply to affected area once 30 g 3   No current facility-administered medications for this visit.   Facility-Administered Medications Ordered in Other Visits  Medication Dose Route Frequency  Provider Last Rate Last Admin   0.9 %  sodium chloride  infusion   Intravenous Continuous Daleena Rotter, MD 10 mL/hr at 07/15/24 0846 New Bag at 07/15/24 0846   CARBOplatin  (PARAPLATIN ) 560 mg in sodium chloride  0.9 % 250 mL chemo infusion  560 mg Intravenous Once Delwin Raczkowski, MD       PACLitaxel  (TAXOL ) 348 mg in sodium chloride  0.9 % 500 mL chemo infusion (> 80mg /m2)  175 mg/m2 (Treatment Plan Recorded) Intravenous Once Dezarai Prew, MD 186 mL/hr at 07/15/24 1050 348 mg at 07/15/24 1050    VITALS:  Blood pressure 108/76, pulse 81, temperature 97.8 F (36.6 C), temperature source Oral, resp. rate 16, weight 205 lb 11 oz (93.3 kg), SpO2 98%.  Wt Readings from Last 3 Encounters:  07/15/24 205 lb 11 oz (93.3 kg)  06/13/24 203 lb (92.1 kg)  06/10/24 203 lb 8 oz (92.3 kg)    Body mass index is 38.24 kg/m.  Performance status (ECOG): 1 - Symptomatic but completely ambulatory  PHYSICAL EXAM:   GENERAL:alert, no distress and comfortable SKIN: skin color, texture, turgor are normal, no rashes or significant lesions LUNGS: clear to auscultation and percussion with normal breathing effort HEART: regular rate & rhythm and no murmurs and no lower extremity edema ABDOMEN:abdomen soft, non-tender and normal bowel sounds Musculoskeletal:no cyanosis of digits and no clubbing  NEURO: alert & oriented x 3 with fluent speech  LABORATORY DATA:  I have reviewed the data as listed     Component Value Date/Time   NA 139 07/15/2024 0748   NA 137 11/25/2015 1429   K 3.9 07/15/2024 0748   K 4.5 05/12/2011 0901   CL 103 07/15/2024 0748   CO2 27 07/15/2024 0748   GLUCOSE 92 07/15/2024 0748   BUN 19 07/15/2024 0748   BUN 15 11/25/2015 1429   CREATININE 0.70 07/15/2024 0748   CREATININE 0.80 10/31/2013 1044   CALCIUM 9.4 07/15/2024 0748   CALCIUM 10.0 05/12/2011 0901   PROT 7.1 07/15/2024 0748   PROT 6.9 11/25/2015 1429   ALBUMIN 4.1 07/15/2024 0748   ALBUMIN 4.1 11/25/2015 1429   AST  23 07/15/2024 0748   AST 21 05/12/2011 0901   ALT 12 07/15/2024 0748   ALKPHOS 83 07/15/2024 0748   ALKPHOS 79 05/12/2011 0901   BILITOT 0.4 07/15/2024 0748   BILITOT 0.6 11/25/2015 1429   BILITOT 0.6 05/12/2011 0901   GFRNONAA >60 07/15/2024 0748   GFRAA 88 11/25/2015 1429     Lab Results  Component Value Date   WBC 4.2 07/15/2024   NEUTROABS 1.5 (L) 07/15/2024   HGB 11.9 (L) 07/15/2024   HCT 37.3 07/15/2024   MCV 84.8 07/15/2024   PLT 310 07/15/2024    Latest Reference Range & Units 06/03/24 15:00  Cancer Antigen (CA) 125 0.0 - 38.1 U/mL 102.0 (H)  (H): Data is abnormally high  RADIOGRAPHIC STUDIES: I have personally reviewed the radiological images as listed and agreed with the findings in  the report  "

## 2024-07-15 NOTE — Progress Notes (Signed)
 Patient presents today for chemotherapy Paclitaxel /Carboplatin  infusion. Patient is in satisfactory condition with no new complaints voiced.  Vital signs are stable.  Labs reviewed by Dr. Davonna during the office visit and all labs are within treatment parameters.  We will proceed with treatment per MD orders.   Treatment given today per MD orders. Tolerated infusion without adverse affects. Vital signs stable. No complaints at this time. Discharged from clinic via wheelchair in stable condition. Alert and oriented x 3. F/U with [facility] as scheduled.

## 2024-07-15 NOTE — Patient Instructions (Signed)
 [facility] [place] - A DEPT OF [facility]  Discharge Instructions: Thank you for choosing  Cancer Center to provide your oncology and hematology care.  If you have a lab appointment with the Cancer Center - please note that after April 8th, 2024, all labs will be drawn in the cancer center.  You do not have to check in or register with the main entrance as you have in the past but will complete your check-in in the cancer center.  Wear comfortable clothing and clothing appropriate for easy access to any Portacath or PICC line.   We strive to give you quality time with your provider. You may need to reschedule your appointment if you arrive late (15 or more minutes).  Arriving late affects you and other patients whose appointments are after yours.  Also, if you miss three or more appointments without notifying the office, you may be dismissed from the clinic at the provider's discretion.      For prescription refill requests, have your pharmacy contact our office and allow 72 hours for refills to be completed.    Today you received the following chemotherapy and/or immunotherapy agents Paclitaxel /Carboplatin       To help prevent nausea and vomiting after your treatment, we encourage you to take your nausea medication as directed.  Paclitaxel  Injection What is this medication? PACLITAXEL  (PAK li TAX el) treats some types of cancer. It works by slowing down the growth of cancer cells. This medicine may be used for other purposes; ask your health care provider or pharmacist if you have questions. COMMON BRAND NAME(S): Onxol, Taxol  What should I tell my care team before I take this medication? They need to know if you have any of these conditions: Heart disease Liver disease Low white blood cell levels An unusual or allergic reaction to paclitaxel , other medications, foods, dyes, or preservatives If you or your partner are pregnant or trying to get  pregnant Breast-feeding How should I use this medication? This medication is injected into a vein. It is given by your care team in a hospital or clinic setting. Talk to your care team about the use of this medication in children. While it may be given to children for selected conditions, precautions do apply. Overdosage: If you think you have taken too much of this medicine contact a poison control center or emergency room at once. NOTE: This medicine is only for you. Do not share this medicine with others. What if I miss a dose? Keep appointments for follow-up doses. It is important not to miss your dose. Call your care team if you are unable to keep an appointment. What may interact with this medication? Do not take this medication with any of the following: Live virus vaccines Other medications may affect the way this medication works. Talk with your care team about all of the medications you take. They may suggest changes to your treatment plan to lower the risk of side effects and to make sure your medications work as intended. This list may not describe all possible interactions. Give your health care provider a list of all the medicines, herbs, non-prescription drugs, or dietary supplements you use. Also tell them if you smoke, drink alcohol, or use illegal drugs. Some items may interact with your medicine. What should I watch for while using this medication? Your condition will be monitored carefully while you are receiving this medication. You may need blood work while taking this medication. This  medication may make you feel generally unwell. This is not uncommon as chemotherapy can affect healthy cells as well as cancer cells. Report any side effects. Continue your course of treatment even though you feel ill unless your care team tells you to stop. This medication can cause serious allergic reactions. To reduce the risk, your care team may give you other medications to take before  receiving this one. Be sure to follow the directions from your care team. This medication may increase your risk of getting an infection. Call your care team for advice if you get a fever, chills, sore throat, or other symptoms of a cold or flu. Do not treat yourself. Try to avoid being around people who are sick. This medication may increase your risk to bruise or bleed. Call your care team if you notice any unusual bleeding. Be careful brushing or flossing your teeth or using a toothpick because you may get an infection or bleed more easily. If you have any dental work done, tell your dentist you are receiving this medication. Talk to your care team if you may be pregnant. Serious birth defects can occur if you take this medication during pregnancy. Talk to your care team before breastfeeding. Changes to your treatment plan may be needed. What side effects may I notice from receiving this medication? Side effects that you should report to your care team as soon as possible: Allergic reactions--skin rash, itching, hives, swelling of the face, lips, tongue, or throat Heart rhythm changes--fast or irregular heartbeat, dizziness, feeling faint or lightheaded, chest pain, trouble breathing Increase in blood pressure Infection--fever, chills, cough, sore throat, wounds that don't heal, pain or trouble when passing urine, general feeling of discomfort or being unwell Low blood pressure--dizziness, feeling faint or lightheaded, blurry vision Low red blood cell level--unusual weakness or fatigue, dizziness, headache, trouble breathing Painful swelling, warmth, or redness of the skin, blisters or sores at the infusion site Pain, tingling, or numbness in the hands or feet Slow heartbeat--dizziness, feeling faint or lightheaded, confusion, trouble breathing, unusual weakness or fatigue Unusual bruising or bleeding Side effects that usually do not require medical attention (report to your care team if they  continue or are bothersome): Diarrhea Hair loss Joint pain Loss of appetite Muscle pain Nausea Vomiting This list may not describe all possible side effects. Call your doctor for medical advice about side effects. You may report side effects to FDA at 1-800-FDA-1088. Where should I keep my medication? This medication is given in a hospital or clinic. It will not be stored at home. NOTE: This sheet is a summary. It may not cover all possible information. If you have questions about this medicine, talk to your doctor, pharmacist, or health care provider.  2024 Elsevier/Gold Standard (2021-11-15 00:00:00)   Carboplatin  Injection What is this medication? CARBOPLATIN  (KAR boe pla tin) treats some types of cancer. It works by slowing down the growth of cancer cells. This medicine may be used for other purposes; ask your health care provider or pharmacist if you have questions. COMMON BRAND NAME(S): Paraplatin  What should I tell my care team before I take this medication? They need to know if you have any of these conditions: Blood disorders Hearing problems Kidney disease Recent or ongoing radiation therapy An unusual or allergic reaction to carboplatin , cisplatin, other medications, foods, dyes, or preservatives Pregnant or trying to get pregnant Breast-feeding How should I use this medication? This medication is injected into a vein. It is given by your care  team in a hospital or clinic setting. Talk to your care team about the use of this medication in children. Special care may be needed. Overdosage: If you think you have taken too much of this medicine contact a poison control center or emergency room at once. NOTE: This medicine is only for you. Do not share this medicine with others. What if I miss a dose? Keep appointments for follow-up doses. It is important not to miss your dose. Call your care team if you are unable to keep an appointment. What may interact with this  medication? Medications for seizures Some antibiotics, such as amikacin, gentamicin, neomycin, streptomycin, tobramycin Vaccines This list may not describe all possible interactions. Give your health care provider a list of all the medicines, herbs, non-prescription drugs, or dietary supplements you use. Also tell them if you smoke, drink alcohol, or use illegal drugs. Some items may interact with your medicine. What should I watch for while using this medication? Your condition will be monitored carefully while you are receiving this medication. You may need blood work while taking this medication. This medication may make you feel generally unwell. This is not uncommon, as chemotherapy can affect healthy cells as well as cancer cells. Report any side effects. Continue your course of treatment even though you feel ill unless your care team tells you to stop. In some cases, you may be given additional medications to help with side effects. Follow all directions for their use. This medication may increase your risk of getting an infection. Call your care team for advice if you get a fever, chills, sore throat, or other symptoms of a cold or flu. Do not treat yourself. Try to avoid being around people who are sick. Avoid taking medications that contain aspirin , acetaminophen , ibuprofen, naproxen, or ketoprofen unless instructed by your care team. These medications may hide a fever. Be careful brushing or flossing your teeth or using a toothpick because you may get an infection or bleed more easily. If you have any dental work done, tell your dentist you are receiving this medication. Talk to your care team if you wish to become pregnant or think you might be pregnant. This medication can cause serious birth defects. Talk to your care team about effective forms of contraception. Do not breast-feed while taking this medication. What side effects may I notice from receiving this medication? Side effects  that you should report to your care team as soon as possible: Allergic reactions--skin rash, itching, hives, swelling of the face, lips, tongue, or throat Infection--fever, chills, cough, sore throat, wounds that don't heal, pain or trouble when passing urine, general feeling of discomfort or being unwell Low red blood cell level--unusual weakness or fatigue, dizziness, headache, trouble breathing Pain, tingling, or numbness in the hands or feet, muscle weakness, change in vision, confusion or trouble speaking, loss of balance or coordination, trouble walking, seizures Unusual bruising or bleeding Side effects that usually do not require medical attention (report to your care team if they continue or are bothersome): Hair loss Nausea Unusual weakness or fatigue Vomiting This list may not describe all possible side effects. Call your doctor for medical advice about side effects. You may report side effects to FDA at 1-800-FDA-1088. Where should I keep my medication? This medication is given in a hospital or clinic. It will not be stored at home. NOTE: This sheet is a summary. It may not cover all possible information. If you have questions about this medicine, talk to your  doctor, pharmacist, or health care provider.  2024 Elsevier/Gold Standard (2021-10-18 00:00:00)  BELOW ARE SYMPTOMS THAT SHOULD BE REPORTED IMMEDIATELY: *FEVER GREATER THAN 100.4 F (38 C) OR HIGHER *CHILLS OR SWEATING *NAUSEA AND VOMITING THAT IS NOT CONTROLLED WITH YOUR NAUSEA MEDICATION *UNUSUAL SHORTNESS OF BREATH *UNUSUAL BRUISING OR BLEEDING *URINARY PROBLEMS (pain or burning when urinating, or frequent urination) *BOWEL PROBLEMS (unusual diarrhea, constipation, pain near the anus) TENDERNESS IN MOUTH AND THROAT WITH OR WITHOUT PRESENCE OF ULCERS (sore throat, sores in mouth, or a toothache) UNUSUAL RASH, SWELLING OR PAIN  UNUSUAL VAGINAL DISCHARGE OR ITCHING   Items with * indicate a potential emergency and  should be followed up as soon as possible or go to the Emergency Department if any problems should occur.  Please show the CHEMOTHERAPY ALERT CARD or IMMUNOTHERAPY ALERT CARD at check-in to the Emergency Department and triage nurse.  Should you have questions after your visit or need to cancel or reschedule your appointment, please contact [facility] [place] - A DEPT OF [facility] [phone]  and follow the prompts.  Office hours are 8:00 a.m. to 4:30 p.m. Monday - Friday. Please note that voicemails left after 4:00 p.m. may not be returned until the following business day.  We are closed weekends and major holidays. You have access to a nurse at all times for urgent questions. Please call the main number to the clinic [phone] and follow the prompts.  For any non-urgent questions, you may also contact your provider using MyChart. We now offer e-Visits for anyone 3 and older to request care online for non-urgent symptoms. For details visit [email or web address].   Also download the MyChart app! Go to the app store, search MyChart, open the app, select [facility], and log in with your MyChart username and password.

## 2024-07-15 NOTE — Patient Instructions (Signed)

## 2024-07-17 ENCOUNTER — Inpatient Hospital Stay

## 2024-07-17 ENCOUNTER — Inpatient Hospital Stay: Admitting: Licensed Clinical Social Worker

## 2024-07-17 VITALS — BP 131/78 | HR 73 | Temp 97.5°F | Resp 18

## 2024-07-17 DIAGNOSIS — C569 Malignant neoplasm of unspecified ovary: Secondary | ICD-10-CM

## 2024-07-17 DIAGNOSIS — Z5111 Encounter for antineoplastic chemotherapy: Secondary | ICD-10-CM | POA: Diagnosis not present

## 2024-07-17 MED ORDER — PEGFILGRASTIM-CBQV 6 MG/0.6ML ~~LOC~~ SOSY
6.0000 mg | PREFILLED_SYRINGE | Freq: Once | SUBCUTANEOUS | Status: AC
  Administered 2024-07-17: 6 mg via SUBCUTANEOUS
  Filled 2024-07-17: qty 0.6

## 2024-07-17 NOTE — Progress Notes (Signed)
 Patient tolerated injection with no complaints voiced. Site clean and dry with no bruising or swelling noted at site. See MAR for details. Band aid applied.  Patient stable during and after injection. VSS with discharge and left in satisfactory condition with no s/s of distress noted.

## 2024-07-17 NOTE — Patient Instructions (Signed)
 [facility] [place] - A DEPT OF [place]  Discharge Instructions: Thank you for choosing [facility] to provide your oncology and hematology care.  If you have a lab appointment with the Cancer Center - please note that after April 8th, 2024, all labs will be drawn in the cancer center.  You do not have to check in or register with the main entrance as you have in the past but will complete your check-in in the cancer center.  Wear comfortable clothing and clothing appropriate for easy access to any Portacath or PICC line.   We strive to give you quality time with your provider. You may need to reschedule your appointment if you arrive late (15 or more minutes).  Arriving late affects you and other patients whose appointments are after yours.  Also, if you miss three or more appointments without notifying the office, you may be dismissed from the clinic at the providers discretion.      For prescription refill requests, have your pharmacy contact our office and allow 72 hours for refills to be completed.    Today you received the following Udencya return as scheduled.   To help prevent nausea and vomiting after your treatment, we encourage you to take your nausea medication as directed.  BELOW ARE SYMPTOMS THAT SHOULD BE REPORTED IMMEDIATELY: *FEVER GREATER THAN 100.4 F (38 C) OR HIGHER *CHILLS OR SWEATING *NAUSEA AND VOMITING THAT IS NOT CONTROLLED WITH YOUR NAUSEA MEDICATION *UNUSUAL SHORTNESS OF BREATH *UNUSUAL BRUISING OR BLEEDING *URINARY PROBLEMS (pain or burning when urinating, or frequent urination) *BOWEL PROBLEMS (unusual diarrhea, constipation, pain near the anus) TENDERNESS IN MOUTH AND THROAT WITH OR WITHOUT PRESENCE OF ULCERS (sore throat, sores in mouth, or a toothache) UNUSUAL RASH, SWELLING OR PAIN  UNUSUAL VAGINAL DISCHARGE OR ITCHING   Items with * indicate a potential emergency and should be followed up as soon as possible or  go to the Emergency Department if any problems should occur.  Please show the CHEMOTHERAPY ALERT CARD or IMMUNOTHERAPY ALERT CARD at check-in to the Emergency Department and triage nurse.  Should you have questions after your visit or need to cancel or reschedule your appointment, please contact [facility] [place] - A DEPT OF [facility] [phone]  and follow the prompts.  Office hours are 8:00 a.m. to 4:30 p.m. Monday - Friday. Please note that voicemails left after 4:00 p.m. may not be returned until the following business day.  We are closed weekends and major holidays. You have access to a nurse at all times for urgent questions. Please call the main number to the clinic [phone] and follow the prompts.  For any non-urgent questions, you may also contact your provider using MyChart. We now offer e-Visits for anyone 48 and older to request care online for non-urgent symptoms. For details visit [email or web address].   Also download the MyChart app! Go to the app store, search MyChart, open the app, select , and log in with your MyChart username and password.

## 2024-07-21 ENCOUNTER — Inpatient Hospital Stay: Admitting: Dietician

## 2024-07-21 ENCOUNTER — Telehealth: Payer: Self-pay | Admitting: Dietician

## 2024-07-21 NOTE — Telephone Encounter (Signed)
 Nutrition Follow-up:  Pt with stage III ovarian cancer with lymphadenopathy. She is receiving neoadjuvant chemotherapy with carboplatin  + paclitaxel . Patient is under the care of Dr. Davonna.   Spoke with patient via telephone. She reports doing well overall. Appetite has been good. Says she ate too well over the holiday. Her energy has been lower than baseline. Asking if this is normal. Patient is tolerating booster shot better with Claritin. She is drinking water , however this is becoming more difficult due to taste.    Medications: reviewed  Labs: reviewed   Anthropometrics: Wt 205 lb 11 oz on 1/6  12/2 - 203 lb 8 oz     NUTRITION DIAGNOSIS: Food and nutrition related knowledge    INTERVENTION:  Continue including good sources of protein at every meal  Suggested adding flavor drops, fruit, splash of juice to aid with taste of water     MONITORING, EVALUATION, GOAL: wt trends, intake    NEXT VISIT: Monday February 23 via telephone

## 2024-07-25 NOTE — Addendum Note (Signed)
 Encounter addended by: Janice Lynwood BROCKS on: 07/25/2024 3:27 PM  Actions taken: Imaging Exam ended

## 2024-07-28 ENCOUNTER — Telehealth: Payer: Self-pay | Admitting: Adult Health

## 2024-07-28 NOTE — Telephone Encounter (Signed)
 Called to check on [name], she says she is going good. Will continue to pray for her, and I am here if you need anything.

## 2024-07-29 ENCOUNTER — Other Ambulatory Visit: Payer: Self-pay | Admitting: Oncology

## 2024-07-29 DIAGNOSIS — C569 Malignant neoplasm of unspecified ovary: Secondary | ICD-10-CM

## 2024-08-05 ENCOUNTER — Other Ambulatory Visit: Payer: Self-pay

## 2024-08-05 ENCOUNTER — Inpatient Hospital Stay

## 2024-08-05 VITALS — BP 109/66 | HR 71 | Temp 98.4°F | Resp 18

## 2024-08-05 DIAGNOSIS — C569 Malignant neoplasm of unspecified ovary: Secondary | ICD-10-CM

## 2024-08-05 DIAGNOSIS — Z5111 Encounter for antineoplastic chemotherapy: Secondary | ICD-10-CM | POA: Diagnosis not present

## 2024-08-05 LAB — CBC WITH DIFFERENTIAL/PLATELET
Abs Immature Granulocytes: 0 10*3/uL (ref 0.00–0.07)
Basophils Absolute: 0 10*3/uL (ref 0.0–0.1)
Basophils Relative: 1 %
Eosinophils Absolute: 0.1 10*3/uL (ref 0.0–0.5)
Eosinophils Relative: 2 %
HCT: 35.6 % — ABNORMAL LOW (ref 36.0–46.0)
Hemoglobin: 11.7 g/dL — ABNORMAL LOW (ref 12.0–15.0)
Immature Granulocytes: 0 %
Lymphocytes Relative: 29 %
Lymphs Abs: 1.3 10*3/uL (ref 0.7–4.0)
MCH: 27.7 pg (ref 26.0–34.0)
MCHC: 32.9 g/dL (ref 30.0–36.0)
MCV: 84.2 fL (ref 80.0–100.0)
Monocytes Absolute: 0.6 10*3/uL (ref 0.1–1.0)
Monocytes Relative: 13 %
Neutro Abs: 2.5 10*3/uL (ref 1.7–7.7)
Neutrophils Relative %: 55 %
Platelets: 323 10*3/uL (ref 150–400)
RBC: 4.23 MIL/uL (ref 3.87–5.11)
RDW: 18.6 % — ABNORMAL HIGH (ref 11.5–15.5)
WBC: 4.6 10*3/uL (ref 4.0–10.5)
nRBC: 0 % (ref 0.0–0.2)

## 2024-08-05 LAB — COMPREHENSIVE METABOLIC PANEL WITH GFR
ALT: 14 U/L (ref 0–44)
AST: 23 U/L (ref 15–41)
Albumin: 4.2 g/dL (ref 3.5–5.0)
Alkaline Phosphatase: 102 U/L (ref 38–126)
Anion gap: 14 (ref 5–15)
BUN: 15 mg/dL (ref 8–23)
CO2: 20 mmol/L — ABNORMAL LOW (ref 22–32)
Calcium: 9.7 mg/dL (ref 8.9–10.3)
Chloride: 106 mmol/L (ref 98–111)
Creatinine, Ser: 0.69 mg/dL (ref 0.44–1.00)
GFR, Estimated: >60 mL/min
Glucose, Bld: 94 mg/dL (ref 70–99)
Potassium: 3.9 mmol/L (ref 3.5–5.1)
Sodium: 141 mmol/L (ref 135–145)
Total Bilirubin: 0.4 mg/dL (ref 0.0–1.2)
Total Protein: 7.1 g/dL (ref 6.5–8.1)

## 2024-08-05 LAB — MAGNESIUM: Magnesium: 2 mg/dL (ref 1.7–2.4)

## 2024-08-05 MED ORDER — FAMOTIDINE IN NACL 20-0.9 MG/50ML-% IV SOLN
20.0000 mg | Freq: Once | INTRAVENOUS | Status: AC
  Administered 2024-08-05: 20 mg via INTRAVENOUS
  Filled 2024-08-05: qty 50

## 2024-08-05 MED ORDER — DEXAMETHASONE SOD PHOSPHATE PF 10 MG/ML IJ SOLN
10.0000 mg | Freq: Once | INTRAMUSCULAR | Status: AC
  Administered 2024-08-05: 10 mg via INTRAVENOUS
  Filled 2024-08-05: qty 1

## 2024-08-05 MED ORDER — SODIUM CHLORIDE 0.9 % IV SOLN
550.0000 mg | Freq: Once | INTRAVENOUS | Status: AC
  Administered 2024-08-05: 550 mg via INTRAVENOUS
  Filled 2024-08-05: qty 55

## 2024-08-05 MED ORDER — SODIUM CHLORIDE 0.9 % IV SOLN
175.0000 mg/m2 | Freq: Once | INTRAVENOUS | Status: AC
  Administered 2024-08-05: 348 mg via INTRAVENOUS
  Filled 2024-08-05: qty 58

## 2024-08-05 MED ORDER — SODIUM CHLORIDE 0.9 % IV SOLN: INTRAVENOUS | Status: DC

## 2024-08-05 MED ORDER — DIPHENHYDRAMINE HCL 50 MG/ML IJ SOLN
50.0000 mg | Freq: Once | INTRAMUSCULAR | Status: AC
  Administered 2024-08-05: 50 mg via INTRAVENOUS
  Filled 2024-08-05: qty 1

## 2024-08-05 MED ORDER — ALTEPLASE 2 MG IJ SOLR
2.0000 mg | Freq: Once | INTRAMUSCULAR | Status: AC
  Administered 2024-08-05: 2 mg
  Filled 2024-08-05: qty 2

## 2024-08-05 MED ORDER — SODIUM CHLORIDE 0.9 % IV SOLN
150.0000 mg | Freq: Once | INTRAVENOUS | Status: AC
  Administered 2024-08-05: 150 mg via INTRAVENOUS
  Filled 2024-08-05: qty 150

## 2024-08-05 MED ORDER — PALONOSETRON HCL INJECTION 0.25 MG/5ML
0.2500 mg | Freq: Once | INTRAVENOUS | Status: AC
  Administered 2024-08-05: 0.25 mg via INTRAVENOUS
  Filled 2024-08-05: qty 5

## 2024-08-05 NOTE — Patient Instructions (Signed)
 [facility] [place] - A DEPT OF [facility]  Discharge Instructions: Thank you for choosing [facility] to provide your oncology and hematology care.  If you have a lab appointment with the Cancer Center - please note that after April 8th, 2024, all labs will be drawn in the cancer center.  You do not have to check in or register with the main entrance as you have in the past but will complete your check-in in the cancer center.  Wear comfortable clothing and clothing appropriate for easy access to any Portacath or PICC line.   We strive to give you quality time with your provider. You may need to reschedule your appointment if you arrive late (15 or more minutes).  Arriving late affects you and other patients whose appointments are after yours.  Also, if you miss three or more appointments without notifying the office, you may be dismissed from the clinic at the provider's discretion.      For prescription refill requests, have your pharmacy contact our office and allow 72 hours for refills to be completed.    Today you received the following chemotherapy and/or immunotherapy agents Taxol and Carboplatin.       To help prevent nausea and vomiting after your treatment, we encourage you to take your nausea medication as directed.  BELOW ARE SYMPTOMS THAT SHOULD BE REPORTED IMMEDIATELY: *FEVER GREATER THAN 100.4 F (38 C) OR HIGHER *CHILLS OR SWEATING *NAUSEA AND VOMITING THAT IS NOT CONTROLLED WITH YOUR NAUSEA MEDICATION *UNUSUAL SHORTNESS OF BREATH *UNUSUAL BRUISING OR BLEEDING *URINARY PROBLEMS (pain or burning when urinating, or frequent urination) *BOWEL PROBLEMS (unusual diarrhea, constipation, pain near the anus) TENDERNESS IN MOUTH AND THROAT WITH OR WITHOUT PRESENCE OF ULCERS (sore throat, sores in mouth, or a toothache) UNUSUAL RASH, SWELLING OR PAIN  UNUSUAL VAGINAL DISCHARGE OR ITCHING   Items with * indicate a potential emergency and should  be followed up as soon as possible or go to the Emergency Department if any problems should occur.  Please show the CHEMOTHERAPY ALERT CARD or IMMUNOTHERAPY ALERT CARD at check-in to the Emergency Department and triage nurse.  Should you have questions after your visit or need to cancel or reschedule your appointment, please contact [facility]ER [facility] [place] - A DEPT OF [facility] [phone]  and follow the prompts.  Office hours are 8:00 a.m. to 4:30 p.m. Monday - Friday. Please note that voicemails left after 4:00 p.m. may not be returned until the following business day.  We are closed weekends and major holidays. You have access to a nurse at all times for urgent questions. Please call the main number to the clinic [phone] and follow the prompts.  For any non-urgent questions, you may also contact your provider using MyChart. We now offer e-Visits for anyone 61 and older to request care online for non-urgent symptoms. For details visit [email or web address].   Also download the MyChart app! Go to the app store, search "MyChart", open the app, select [place], and log in with your MyChart username and password.

## 2024-08-05 NOTE — Progress Notes (Signed)
 Patient's port accessed for port flush and treatment. No blood return noted. Port flushes with ease, no resistant, no pain/discomfort/or swelling while flushing. Labs drawn peripherally. Alteplase  administered at 1046.Treatment team made aware.  [name]

## 2024-08-05 NOTE — Progress Notes (Signed)
 Alteplase  removed after no blood return from port.  Port flushes with no complaints of pain from the patient.   Port flushed with NACL bolus with no return.  Dr. Davonna made aware with verbal order OK to treat. Pharmacy aware.

## 2024-08-06 ENCOUNTER — Telehealth: Payer: Self-pay | Admitting: Licensed Clinical Social Worker

## 2024-08-06 ENCOUNTER — Telehealth: Payer: Self-pay | Admitting: *Deleted

## 2024-08-06 LAB — CA 125: Cancer Antigen (CA) 125: 11.7 U/mL (ref 0.0–38.1)

## 2024-08-06 NOTE — Telephone Encounter (Signed)
 Patient left vm on triage and requested a call back from [place] in genetics. Msg sent to [facility]

## 2024-08-06 NOTE — Telephone Encounter (Signed)
 Patient called to make sure she didn't miss an appointment with me. I let her know we cancelled her 1/8 genetics appointment since she had already met. She informed me that she let her family know about the BRCA2 mutation. She knows she can call with any further questions.   Dena Cary, MS, [facility] Genetic Counselor [email or web address] Phone: [phone]

## 2024-08-07 ENCOUNTER — Inpatient Hospital Stay

## 2024-08-07 VITALS — BP 150/82 | HR 87 | Temp 96.1°F | Resp 20

## 2024-08-07 DIAGNOSIS — C569 Malignant neoplasm of unspecified ovary: Secondary | ICD-10-CM

## 2024-08-07 DIAGNOSIS — Z5111 Encounter for antineoplastic chemotherapy: Secondary | ICD-10-CM | POA: Diagnosis not present

## 2024-08-07 MED ORDER — PEGFILGRASTIM-CBQV 6 MG/0.6ML ~~LOC~~ SOSY
6.0000 mg | PREFILLED_SYRINGE | Freq: Once | SUBCUTANEOUS | Status: AC
  Administered 2024-08-07: 6 mg via SUBCUTANEOUS
  Filled 2024-08-07: qty 0.6

## 2024-08-07 NOTE — Progress Notes (Signed)
 Patient tolerated injection with no complaints voiced. Site clean and dry with no bruising or swelling noted at site. See MAR for details. Band aid applied.  Patient stable during and after injection. VSS with discharge and left in satisfactory condition with no s/s of distress noted.

## 2024-08-07 NOTE — Patient Instructions (Signed)
 [facility] [place] - A DEPT OF [place]  Discharge Instructions: Thank you for choosing [facility] to provide your oncology and hematology care.  If you have a lab appointment with the Cancer Center - please note that after April 8th, 2024, all labs will be drawn in the cancer center.  You do not have to check in or register with the main entrance as you have in the past but will complete your check-in in the cancer center.  Wear comfortable clothing and clothing appropriate for easy access to any Portacath or PICC line.   We strive to give you quality time with your provider. You may need to reschedule your appointment if you arrive late (15 or more minutes).  Arriving late affects you and other patients whose appointments are after yours.  Also, if you miss three or more appointments without notifying the office, you may be dismissed from the clinic at the provider's discretion.      For prescription refill requests, have your pharmacy contact our office and allow 72 hours for refills to be completed.    Today you received the following Udencya, return as scheduled.   To help prevent nausea and vomiting after your treatment, we encourage you to take your nausea medication as directed.  BELOW ARE SYMPTOMS THAT SHOULD BE REPORTED IMMEDIATELY: *FEVER GREATER THAN 100.4 F (38 C) OR HIGHER *CHILLS OR SWEATING *NAUSEA AND VOMITING THAT IS NOT CONTROLLED WITH YOUR NAUSEA MEDICATION *UNUSUAL SHORTNESS OF BREATH *UNUSUAL BRUISING OR BLEEDING *URINARY PROBLEMS (pain or burning when urinating, or frequent urination) *BOWEL PROBLEMS (unusual diarrhea, constipation, pain near the anus) TENDERNESS IN MOUTH AND THROAT WITH OR WITHOUT PRESENCE OF ULCERS (sore throat, sores in mouth, or a toothache) UNUSUAL RASH, SWELLING OR PAIN  UNUSUAL VAGINAL DISCHARGE OR ITCHING   Items with * indicate a potential emergency and should be followed up as soon as possible or  go to the Emergency Department if any problems should occur.  Please show the CHEMOTHERAPY ALERT CARD or IMMUNOTHERAPY ALERT CARD at check-in to the Emergency Department and triage nurse.  Should you have questions after your visit or need to cancel or reschedule your appointment, please contact [facility] [place] - A DEPT OF [facility] [phone]  and follow the prompts.  Office hours are 8:00 a.m. to 4:30 p.m. Monday - Friday. Please note that voicemails left after 4:00 p.m. may not be returned until the following business day.  We are closed weekends and major holidays. You have access to a nurse at all times for urgent questions. Please call the main number to the clinic [phone] and follow the prompts.  For any non-urgent questions, you may also contact your provider using MyChart. We now offer e-Visits for anyone 63 and older to request care online for non-urgent symptoms. For details visit [email or web address].   Also download the MyChart app! Go to the app store, search MyChart, open the app, select [place], and log in with your MyChart username and password.

## 2024-08-14 ENCOUNTER — Ambulatory Visit (HOSPITAL_COMMUNITY): Admission: RE | Admit: 2024-08-14

## 2024-08-14 DIAGNOSIS — C569 Malignant neoplasm of unspecified ovary: Secondary | ICD-10-CM

## 2024-08-14 MED ORDER — FLUDEOXYGLUCOSE F - 18 (FDG) INJECTION
11.1100 | Freq: Once | INTRAVENOUS | Status: AC | PRN
  Administered 2024-08-14: 11.11 via INTRAVENOUS

## 2024-08-18 ENCOUNTER — Inpatient Hospital Stay: Attending: Oncology | Admitting: Psychiatry

## 2024-08-18 DIAGNOSIS — C569 Malignant neoplasm of unspecified ovary: Secondary | ICD-10-CM

## 2024-08-21 ENCOUNTER — Other Ambulatory Visit (HOSPITAL_COMMUNITY)

## 2024-08-26 ENCOUNTER — Inpatient Hospital Stay: Admitting: Oncology

## 2024-08-26 ENCOUNTER — Inpatient Hospital Stay

## 2024-08-28 ENCOUNTER — Inpatient Hospital Stay

## 2024-09-01 ENCOUNTER — Inpatient Hospital Stay: Admitting: Dietician

## 2024-09-16 ENCOUNTER — Inpatient Hospital Stay: Attending: Oncology

## 2024-09-16 ENCOUNTER — Inpatient Hospital Stay: Admitting: Oncology

## 2024-09-16 ENCOUNTER — Inpatient Hospital Stay

## 2024-09-17 ENCOUNTER — Ambulatory Visit: Admitting: Physician Assistant

## 2024-09-18 ENCOUNTER — Inpatient Hospital Stay

## 2024-10-27 ENCOUNTER — Ambulatory Visit: Admitting: Physician Assistant
